# Patient Record
Sex: Female | Born: 1935 | Race: Black or African American | Hispanic: No | Smoking: Never smoker
Health system: Southern US, Community
[De-identification: ages and names within clinical notes are randomized; demographics above are authoritative.]

## PROBLEM LIST (undated history)

## (undated) DIAGNOSIS — I2089 Other forms of angina pectoris: Secondary | ICD-10-CM

## (undated) DIAGNOSIS — M810 Age-related osteoporosis without current pathological fracture: Secondary | ICD-10-CM

## (undated) DIAGNOSIS — E559 Vitamin D deficiency, unspecified: Secondary | ICD-10-CM

## (undated) DIAGNOSIS — G309 Alzheimer's disease, unspecified: Secondary | ICD-10-CM

## (undated) DIAGNOSIS — R29898 Other symptoms and signs involving the musculoskeletal system: Secondary | ICD-10-CM

## (undated) DIAGNOSIS — I639 Cerebral infarction, unspecified: Secondary | ICD-10-CM

## (undated) DIAGNOSIS — R32 Unspecified urinary incontinence: Secondary | ICD-10-CM

## (undated) DIAGNOSIS — R739 Hyperglycemia, unspecified: Secondary | ICD-10-CM

## (undated) DIAGNOSIS — E7800 Pure hypercholesterolemia, unspecified: Secondary | ICD-10-CM

## (undated) DIAGNOSIS — R296 Repeated falls: Secondary | ICD-10-CM

## (undated) DIAGNOSIS — T7840XA Allergy, unspecified, initial encounter: Secondary | ICD-10-CM

## (undated) DIAGNOSIS — I208 Other forms of angina pectoris: Secondary | ICD-10-CM

## (undated) DIAGNOSIS — I219 Acute myocardial infarction, unspecified: Secondary | ICD-10-CM

## (undated) DIAGNOSIS — E079 Disorder of thyroid, unspecified: Secondary | ICD-10-CM

## (undated) DIAGNOSIS — D649 Anemia, unspecified: Secondary | ICD-10-CM

## (undated) DIAGNOSIS — Z8719 Personal history of other diseases of the digestive system: Secondary | ICD-10-CM

## (undated) DIAGNOSIS — F0280 Dementia in other diseases classified elsewhere without behavioral disturbance: Secondary | ICD-10-CM

## (undated) DIAGNOSIS — I509 Heart failure, unspecified: Secondary | ICD-10-CM

## (undated) DIAGNOSIS — E785 Hyperlipidemia, unspecified: Secondary | ICD-10-CM

## (undated) DIAGNOSIS — I10 Essential (primary) hypertension: Secondary | ICD-10-CM

## (undated) DIAGNOSIS — M109 Gout, unspecified: Secondary | ICD-10-CM

## (undated) DIAGNOSIS — R200 Anesthesia of skin: Secondary | ICD-10-CM

## (undated) DIAGNOSIS — F0150 Vascular dementia without behavioral disturbance: Secondary | ICD-10-CM

## (undated) DIAGNOSIS — N189 Chronic kidney disease, unspecified: Secondary | ICD-10-CM

## (undated) HISTORY — DX: Chronic kidney disease, unspecified: N18.9

## (undated) HISTORY — DX: Essential (primary) hypertension: I10

## (undated) HISTORY — DX: Dementia in other diseases classified elsewhere, unspecified severity, without behavioral disturbance, psychotic disturbance, mood disturbance, and anxiety: F02.80

## (undated) HISTORY — DX: Personal history of other diseases of the digestive system: Z87.19

## (undated) HISTORY — DX: Vascular dementia, unspecified severity, without behavioral disturbance, psychotic disturbance, mood disturbance, and anxiety: F01.50

## (undated) HISTORY — DX: Alzheimer's disease, unspecified: G30.9

## (undated) HISTORY — PX: EXPLORATORY LAPAROTOMY: SUR591

## (undated) HISTORY — DX: Age-related osteoporosis without current pathological fracture: M81.0

## (undated) HISTORY — DX: Repeated falls: R29.6

## (undated) HISTORY — DX: Hyperlipidemia, unspecified: E78.5

## (undated) HISTORY — DX: Anesthesia of skin: R20.0

## (undated) HISTORY — DX: Vitamin D deficiency, unspecified: E55.9

## (undated) HISTORY — DX: Disorder of thyroid, unspecified: E07.9

## (undated) HISTORY — DX: Heart failure, unspecified: I50.9

## (undated) HISTORY — DX: Gout, unspecified: M10.9

## (undated) HISTORY — DX: Other symptoms and signs involving the musculoskeletal system: R29.898

## (undated) HISTORY — DX: Allergy, unspecified, initial encounter: T78.40XA

## (undated) HISTORY — DX: Anemia, unspecified: D64.9

## (undated) HISTORY — DX: Unspecified urinary incontinence: R32

## (undated) HISTORY — PX: EYE SURGERY: SHX253

## (undated) HISTORY — DX: Hyperglycemia, unspecified: R73.9

---

## 2003-12-07 ENCOUNTER — Other Ambulatory Visit: Payer: Self-pay

## 2004-08-01 ENCOUNTER — Ambulatory Visit: Payer: Self-pay | Admitting: Nephrology

## 2004-09-22 ENCOUNTER — Inpatient Hospital Stay: Payer: Self-pay

## 2006-06-22 ENCOUNTER — Ambulatory Visit: Payer: Self-pay | Admitting: Family Medicine

## 2006-11-23 ENCOUNTER — Emergency Department: Payer: Self-pay | Admitting: Emergency Medicine

## 2006-12-26 ENCOUNTER — Emergency Department: Payer: Self-pay | Admitting: Emergency Medicine

## 2007-04-05 DIAGNOSIS — Z8719 Personal history of other diseases of the digestive system: Secondary | ICD-10-CM | POA: Insufficient documentation

## 2007-10-24 ENCOUNTER — Emergency Department: Payer: Self-pay | Admitting: Emergency Medicine

## 2007-11-01 ENCOUNTER — Ambulatory Visit: Payer: Self-pay | Admitting: Family Medicine

## 2008-01-22 ENCOUNTER — Other Ambulatory Visit: Payer: Self-pay

## 2008-01-23 ENCOUNTER — Inpatient Hospital Stay: Payer: Self-pay | Admitting: Internal Medicine

## 2008-08-10 ENCOUNTER — Ambulatory Visit: Payer: Self-pay | Admitting: Family Medicine

## 2009-05-17 ENCOUNTER — Ambulatory Visit: Payer: Self-pay | Admitting: Family Medicine

## 2010-05-01 LAB — HM DEXA SCAN

## 2010-06-03 ENCOUNTER — Ambulatory Visit: Payer: Self-pay | Admitting: Family Medicine

## 2010-07-03 ENCOUNTER — Ambulatory Visit: Payer: Self-pay | Admitting: Family Medicine

## 2010-07-03 LAB — HM MAMMOGRAPHY: HM MAMMO: NORMAL

## 2010-09-12 ENCOUNTER — Emergency Department: Payer: Self-pay | Admitting: Emergency Medicine

## 2011-02-25 ENCOUNTER — Inpatient Hospital Stay: Payer: Self-pay | Admitting: Internal Medicine

## 2011-10-08 DIAGNOSIS — M109 Gout, unspecified: Secondary | ICD-10-CM | POA: Diagnosis not present

## 2011-10-08 DIAGNOSIS — E785 Hyperlipidemia, unspecified: Secondary | ICD-10-CM | POA: Diagnosis not present

## 2011-10-08 DIAGNOSIS — N184 Chronic kidney disease, stage 4 (severe): Secondary | ICD-10-CM | POA: Diagnosis not present

## 2011-10-08 DIAGNOSIS — I10 Essential (primary) hypertension: Secondary | ICD-10-CM | POA: Diagnosis not present

## 2011-11-17 DIAGNOSIS — E785 Hyperlipidemia, unspecified: Secondary | ICD-10-CM | POA: Diagnosis not present

## 2011-11-17 DIAGNOSIS — E039 Hypothyroidism, unspecified: Secondary | ICD-10-CM | POA: Diagnosis not present

## 2011-11-17 DIAGNOSIS — N184 Chronic kidney disease, stage 4 (severe): Secondary | ICD-10-CM | POA: Diagnosis not present

## 2011-11-17 DIAGNOSIS — I10 Essential (primary) hypertension: Secondary | ICD-10-CM | POA: Diagnosis not present

## 2012-04-05 DIAGNOSIS — N184 Chronic kidney disease, stage 4 (severe): Secondary | ICD-10-CM | POA: Diagnosis not present

## 2012-04-05 DIAGNOSIS — I10 Essential (primary) hypertension: Secondary | ICD-10-CM | POA: Diagnosis not present

## 2012-04-15 DIAGNOSIS — N250 Renal osteodystrophy: Secondary | ICD-10-CM | POA: Diagnosis not present

## 2012-04-15 DIAGNOSIS — N184 Chronic kidney disease, stage 4 (severe): Secondary | ICD-10-CM | POA: Diagnosis not present

## 2012-04-15 DIAGNOSIS — D631 Anemia in chronic kidney disease: Secondary | ICD-10-CM | POA: Diagnosis not present

## 2012-04-15 DIAGNOSIS — N039 Chronic nephritic syndrome with unspecified morphologic changes: Secondary | ICD-10-CM | POA: Diagnosis not present

## 2012-04-15 DIAGNOSIS — I10 Essential (primary) hypertension: Secondary | ICD-10-CM | POA: Diagnosis not present

## 2012-05-17 DIAGNOSIS — E039 Hypothyroidism, unspecified: Secondary | ICD-10-CM | POA: Diagnosis not present

## 2012-05-17 DIAGNOSIS — N184 Chronic kidney disease, stage 4 (severe): Secondary | ICD-10-CM | POA: Diagnosis not present

## 2012-05-17 DIAGNOSIS — F17200 Nicotine dependence, unspecified, uncomplicated: Secondary | ICD-10-CM | POA: Diagnosis not present

## 2012-05-17 DIAGNOSIS — F0280 Dementia in other diseases classified elsewhere without behavioral disturbance: Secondary | ICD-10-CM | POA: Diagnosis not present

## 2012-05-17 DIAGNOSIS — E785 Hyperlipidemia, unspecified: Secondary | ICD-10-CM | POA: Diagnosis not present

## 2012-05-17 DIAGNOSIS — Z23 Encounter for immunization: Secondary | ICD-10-CM | POA: Diagnosis not present

## 2012-05-17 DIAGNOSIS — G309 Alzheimer's disease, unspecified: Secondary | ICD-10-CM | POA: Diagnosis not present

## 2012-05-17 DIAGNOSIS — I10 Essential (primary) hypertension: Secondary | ICD-10-CM | POA: Diagnosis not present

## 2012-06-11 DIAGNOSIS — I10 Essential (primary) hypertension: Secondary | ICD-10-CM | POA: Diagnosis not present

## 2012-06-11 DIAGNOSIS — I2510 Atherosclerotic heart disease of native coronary artery without angina pectoris: Secondary | ICD-10-CM | POA: Diagnosis not present

## 2012-07-08 ENCOUNTER — Emergency Department: Payer: Self-pay | Admitting: Unknown Physician Specialty

## 2012-07-08 DIAGNOSIS — Z79899 Other long term (current) drug therapy: Secondary | ICD-10-CM | POA: Diagnosis not present

## 2012-07-08 DIAGNOSIS — I10 Essential (primary) hypertension: Secondary | ICD-10-CM | POA: Diagnosis not present

## 2012-07-08 DIAGNOSIS — I252 Old myocardial infarction: Secondary | ICD-10-CM | POA: Diagnosis not present

## 2012-07-08 DIAGNOSIS — K449 Diaphragmatic hernia without obstruction or gangrene: Secondary | ICD-10-CM | POA: Diagnosis not present

## 2012-07-08 DIAGNOSIS — Z8249 Family history of ischemic heart disease and other diseases of the circulatory system: Secondary | ICD-10-CM | POA: Diagnosis not present

## 2012-07-08 DIAGNOSIS — R079 Chest pain, unspecified: Secondary | ICD-10-CM | POA: Diagnosis not present

## 2012-07-08 DIAGNOSIS — Z95818 Presence of other cardiac implants and grafts: Secondary | ICD-10-CM | POA: Diagnosis not present

## 2012-07-08 DIAGNOSIS — R0789 Other chest pain: Secondary | ICD-10-CM | POA: Diagnosis not present

## 2012-07-08 LAB — CK TOTAL AND CKMB (NOT AT ARMC)
CK, Total: 74 U/L (ref 21–215)
CK-MB: 1.5 ng/mL (ref 0.5–3.6)

## 2012-07-08 LAB — COMPREHENSIVE METABOLIC PANEL
Albumin: 3.8 g/dL (ref 3.4–5.0)
Alkaline Phosphatase: 112 U/L (ref 50–136)
Bilirubin,Total: 0.3 mg/dL (ref 0.2–1.0)
Chloride: 108 mmol/L — ABNORMAL HIGH (ref 98–107)
Co2: 21 mmol/L (ref 21–32)
Creatinine: 3.21 mg/dL — ABNORMAL HIGH (ref 0.60–1.30)
EGFR (African American): 15 — ABNORMAL LOW
EGFR (Non-African Amer.): 13 — ABNORMAL LOW
Glucose: 145 mg/dL — ABNORMAL HIGH (ref 65–99)
Osmolality: 288 (ref 275–301)
SGPT (ALT): 13 U/L (ref 12–78)
Sodium: 139 mmol/L (ref 136–145)
Total Protein: 8.1 g/dL (ref 6.4–8.2)

## 2012-07-08 LAB — CBC
HCT: 36.4 % (ref 35.0–47.0)
MCHC: 33 g/dL (ref 32.0–36.0)
Platelet: 279 10*3/uL (ref 150–440)
RBC: 4.3 10*6/uL (ref 3.80–5.20)
RDW: 16.8 % — ABNORMAL HIGH (ref 11.5–14.5)
WBC: 10.6 10*3/uL (ref 3.6–11.0)

## 2012-07-08 LAB — TROPONIN I: Troponin-I: <0.02 ng/mL

## 2012-07-09 DIAGNOSIS — I10 Essential (primary) hypertension: Secondary | ICD-10-CM | POA: Diagnosis not present

## 2012-07-09 DIAGNOSIS — I2510 Atherosclerotic heart disease of native coronary artery without angina pectoris: Secondary | ICD-10-CM | POA: Diagnosis not present

## 2012-07-09 DIAGNOSIS — E782 Mixed hyperlipidemia: Secondary | ICD-10-CM | POA: Diagnosis not present

## 2012-07-09 DIAGNOSIS — R0789 Other chest pain: Secondary | ICD-10-CM | POA: Diagnosis not present

## 2012-08-30 DIAGNOSIS — I10 Essential (primary) hypertension: Secondary | ICD-10-CM | POA: Diagnosis not present

## 2012-08-30 DIAGNOSIS — M109 Gout, unspecified: Secondary | ICD-10-CM | POA: Diagnosis not present

## 2012-08-30 DIAGNOSIS — N184 Chronic kidney disease, stage 4 (severe): Secondary | ICD-10-CM | POA: Diagnosis not present

## 2012-09-02 DIAGNOSIS — N179 Acute kidney failure, unspecified: Secondary | ICD-10-CM | POA: Diagnosis not present

## 2012-09-02 DIAGNOSIS — I10 Essential (primary) hypertension: Secondary | ICD-10-CM | POA: Diagnosis not present

## 2012-09-02 DIAGNOSIS — N184 Chronic kidney disease, stage 4 (severe): Secondary | ICD-10-CM | POA: Diagnosis not present

## 2012-09-02 DIAGNOSIS — D649 Anemia, unspecified: Secondary | ICD-10-CM | POA: Diagnosis not present

## 2012-09-02 DIAGNOSIS — E872 Acidosis: Secondary | ICD-10-CM | POA: Diagnosis not present

## 2012-09-06 DIAGNOSIS — I2510 Atherosclerotic heart disease of native coronary artery without angina pectoris: Secondary | ICD-10-CM | POA: Diagnosis not present

## 2012-09-06 DIAGNOSIS — E782 Mixed hyperlipidemia: Secondary | ICD-10-CM | POA: Diagnosis not present

## 2012-09-06 DIAGNOSIS — I10 Essential (primary) hypertension: Secondary | ICD-10-CM | POA: Diagnosis not present

## 2012-09-20 DIAGNOSIS — I10 Essential (primary) hypertension: Secondary | ICD-10-CM | POA: Diagnosis not present

## 2012-09-20 DIAGNOSIS — E872 Acidosis: Secondary | ICD-10-CM | POA: Diagnosis not present

## 2012-09-20 DIAGNOSIS — N179 Acute kidney failure, unspecified: Secondary | ICD-10-CM | POA: Diagnosis not present

## 2012-09-20 DIAGNOSIS — I2510 Atherosclerotic heart disease of native coronary artery without angina pectoris: Secondary | ICD-10-CM | POA: Diagnosis not present

## 2012-09-20 DIAGNOSIS — E039 Hypothyroidism, unspecified: Secondary | ICD-10-CM | POA: Diagnosis not present

## 2012-09-20 DIAGNOSIS — E785 Hyperlipidemia, unspecified: Secondary | ICD-10-CM | POA: Diagnosis not present

## 2012-09-20 DIAGNOSIS — N185 Chronic kidney disease, stage 5: Secondary | ICD-10-CM | POA: Diagnosis not present

## 2012-09-20 DIAGNOSIS — N184 Chronic kidney disease, stage 4 (severe): Secondary | ICD-10-CM | POA: Diagnosis not present

## 2012-11-11 DIAGNOSIS — I10 Essential (primary) hypertension: Secondary | ICD-10-CM | POA: Diagnosis not present

## 2012-11-11 DIAGNOSIS — E872 Acidosis: Secondary | ICD-10-CM | POA: Diagnosis not present

## 2012-11-11 DIAGNOSIS — N184 Chronic kidney disease, stage 4 (severe): Secondary | ICD-10-CM | POA: Diagnosis not present

## 2013-01-20 DIAGNOSIS — E785 Hyperlipidemia, unspecified: Secondary | ICD-10-CM | POA: Diagnosis not present

## 2013-01-20 DIAGNOSIS — I10 Essential (primary) hypertension: Secondary | ICD-10-CM | POA: Diagnosis not present

## 2013-01-20 DIAGNOSIS — N390 Urinary tract infection, site not specified: Secondary | ICD-10-CM | POA: Diagnosis not present

## 2013-01-20 DIAGNOSIS — E039 Hypothyroidism, unspecified: Secondary | ICD-10-CM | POA: Diagnosis not present

## 2013-01-20 DIAGNOSIS — N184 Chronic kidney disease, stage 4 (severe): Secondary | ICD-10-CM | POA: Diagnosis not present

## 2013-01-20 DIAGNOSIS — I2510 Atherosclerotic heart disease of native coronary artery without angina pectoris: Secondary | ICD-10-CM | POA: Diagnosis not present

## 2013-05-03 DIAGNOSIS — N185 Chronic kidney disease, stage 5: Secondary | ICD-10-CM | POA: Diagnosis not present

## 2013-05-26 DIAGNOSIS — R94120 Abnormal auditory function study: Secondary | ICD-10-CM | POA: Diagnosis not present

## 2013-05-26 DIAGNOSIS — N184 Chronic kidney disease, stage 4 (severe): Secondary | ICD-10-CM | POA: Diagnosis not present

## 2013-05-26 DIAGNOSIS — E039 Hypothyroidism, unspecified: Secondary | ICD-10-CM | POA: Diagnosis not present

## 2013-05-26 DIAGNOSIS — Z23 Encounter for immunization: Secondary | ICD-10-CM | POA: Diagnosis not present

## 2013-05-26 DIAGNOSIS — Z1331 Encounter for screening for depression: Secondary | ICD-10-CM | POA: Diagnosis not present

## 2013-05-26 DIAGNOSIS — I10 Essential (primary) hypertension: Secondary | ICD-10-CM | POA: Diagnosis not present

## 2013-05-26 DIAGNOSIS — M159 Polyosteoarthritis, unspecified: Secondary | ICD-10-CM | POA: Diagnosis not present

## 2013-10-03 DIAGNOSIS — E785 Hyperlipidemia, unspecified: Secondary | ICD-10-CM | POA: Diagnosis not present

## 2013-10-03 DIAGNOSIS — N184 Chronic kidney disease, stage 4 (severe): Secondary | ICD-10-CM | POA: Diagnosis not present

## 2013-10-03 DIAGNOSIS — I10 Essential (primary) hypertension: Secondary | ICD-10-CM | POA: Diagnosis not present

## 2013-10-03 DIAGNOSIS — I2510 Atherosclerotic heart disease of native coronary artery without angina pectoris: Secondary | ICD-10-CM | POA: Diagnosis not present

## 2013-10-03 DIAGNOSIS — E039 Hypothyroidism, unspecified: Secondary | ICD-10-CM | POA: Diagnosis not present

## 2013-10-05 DIAGNOSIS — R7309 Other abnormal glucose: Secondary | ICD-10-CM | POA: Diagnosis not present

## 2013-10-05 DIAGNOSIS — M109 Gout, unspecified: Secondary | ICD-10-CM | POA: Diagnosis not present

## 2013-10-05 DIAGNOSIS — D649 Anemia, unspecified: Secondary | ICD-10-CM | POA: Diagnosis not present

## 2013-11-24 DIAGNOSIS — E872 Acidosis, unspecified: Secondary | ICD-10-CM | POA: Diagnosis not present

## 2013-11-24 DIAGNOSIS — N185 Chronic kidney disease, stage 5: Secondary | ICD-10-CM | POA: Diagnosis not present

## 2013-11-24 DIAGNOSIS — I120 Hypertensive chronic kidney disease with stage 5 chronic kidney disease or end stage renal disease: Secondary | ICD-10-CM | POA: Diagnosis not present

## 2014-02-07 DIAGNOSIS — D509 Iron deficiency anemia, unspecified: Secondary | ICD-10-CM | POA: Diagnosis not present

## 2014-02-07 DIAGNOSIS — Z8711 Personal history of peptic ulcer disease: Secondary | ICD-10-CM | POA: Diagnosis not present

## 2014-02-07 DIAGNOSIS — E039 Hypothyroidism, unspecified: Secondary | ICD-10-CM | POA: Diagnosis not present

## 2014-02-07 DIAGNOSIS — N184 Chronic kidney disease, stage 4 (severe): Secondary | ICD-10-CM | POA: Diagnosis not present

## 2014-03-29 DIAGNOSIS — I214 Non-ST elevation (NSTEMI) myocardial infarction: Secondary | ICD-10-CM | POA: Insufficient documentation

## 2014-03-29 DIAGNOSIS — E785 Hyperlipidemia, unspecified: Secondary | ICD-10-CM | POA: Insufficient documentation

## 2014-03-29 DIAGNOSIS — I5032 Chronic diastolic (congestive) heart failure: Secondary | ICD-10-CM | POA: Insufficient documentation

## 2014-03-30 DIAGNOSIS — I214 Non-ST elevation (NSTEMI) myocardial infarction: Secondary | ICD-10-CM | POA: Diagnosis not present

## 2014-03-30 DIAGNOSIS — I2510 Atherosclerotic heart disease of native coronary artery without angina pectoris: Secondary | ICD-10-CM | POA: Diagnosis not present

## 2014-03-30 DIAGNOSIS — I5032 Chronic diastolic (congestive) heart failure: Secondary | ICD-10-CM | POA: Diagnosis not present

## 2014-03-30 DIAGNOSIS — I10 Essential (primary) hypertension: Secondary | ICD-10-CM | POA: Diagnosis not present

## 2014-06-12 DIAGNOSIS — N184 Chronic kidney disease, stage 4 (severe): Secondary | ICD-10-CM | POA: Diagnosis not present

## 2014-06-12 DIAGNOSIS — M109 Gout, unspecified: Secondary | ICD-10-CM | POA: Diagnosis not present

## 2014-06-12 DIAGNOSIS — R739 Hyperglycemia, unspecified: Secondary | ICD-10-CM | POA: Diagnosis not present

## 2014-06-12 DIAGNOSIS — E785 Hyperlipidemia, unspecified: Secondary | ICD-10-CM | POA: Diagnosis not present

## 2014-06-12 DIAGNOSIS — I10 Essential (primary) hypertension: Secondary | ICD-10-CM | POA: Diagnosis not present

## 2014-06-12 DIAGNOSIS — Z23 Encounter for immunization: Secondary | ICD-10-CM | POA: Diagnosis not present

## 2014-06-12 DIAGNOSIS — E039 Hypothyroidism, unspecified: Secondary | ICD-10-CM | POA: Diagnosis not present

## 2014-06-12 DIAGNOSIS — D509 Iron deficiency anemia, unspecified: Secondary | ICD-10-CM | POA: Diagnosis not present

## 2014-09-01 DIAGNOSIS — N185 Chronic kidney disease, stage 5: Secondary | ICD-10-CM | POA: Diagnosis not present

## 2014-09-04 DIAGNOSIS — I120 Hypertensive chronic kidney disease with stage 5 chronic kidney disease or end stage renal disease: Secondary | ICD-10-CM | POA: Diagnosis not present

## 2014-09-04 DIAGNOSIS — N185 Chronic kidney disease, stage 5: Secondary | ICD-10-CM | POA: Diagnosis not present

## 2014-09-04 DIAGNOSIS — D631 Anemia in chronic kidney disease: Secondary | ICD-10-CM | POA: Diagnosis not present

## 2014-10-13 DIAGNOSIS — E039 Hypothyroidism, unspecified: Secondary | ICD-10-CM | POA: Diagnosis not present

## 2014-10-13 DIAGNOSIS — M109 Gout, unspecified: Secondary | ICD-10-CM | POA: Diagnosis not present

## 2014-10-13 DIAGNOSIS — M810 Age-related osteoporosis without current pathological fracture: Secondary | ICD-10-CM | POA: Diagnosis not present

## 2014-10-13 DIAGNOSIS — G3184 Mild cognitive impairment, so stated: Secondary | ICD-10-CM | POA: Diagnosis not present

## 2014-10-13 DIAGNOSIS — I10 Essential (primary) hypertension: Secondary | ICD-10-CM | POA: Diagnosis not present

## 2014-10-13 DIAGNOSIS — N184 Chronic kidney disease, stage 4 (severe): Secondary | ICD-10-CM | POA: Diagnosis not present

## 2014-10-13 DIAGNOSIS — E785 Hyperlipidemia, unspecified: Secondary | ICD-10-CM | POA: Diagnosis not present

## 2014-10-13 DIAGNOSIS — M159 Polyosteoarthritis, unspecified: Secondary | ICD-10-CM | POA: Diagnosis not present

## 2014-10-13 LAB — LIPID PANEL
Cholesterol: 157 mg/dL (ref 0–200)
HDL: 54 mg/dL (ref 35–70)
LDL Cholesterol: 74 mg/dL
TRIGLYCERIDES: 143 mg/dL (ref 40–160)

## 2014-10-17 DIAGNOSIS — N184 Chronic kidney disease, stage 4 (severe): Secondary | ICD-10-CM | POA: Diagnosis not present

## 2015-02-18 ENCOUNTER — Other Ambulatory Visit: Payer: Self-pay | Admitting: Family Medicine

## 2015-03-13 DIAGNOSIS — N185 Chronic kidney disease, stage 5: Secondary | ICD-10-CM | POA: Diagnosis not present

## 2015-03-13 DIAGNOSIS — I1311 Hypertensive heart and chronic kidney disease without heart failure, with stage 5 chronic kidney disease, or end stage renal disease: Secondary | ICD-10-CM | POA: Diagnosis not present

## 2015-03-19 DIAGNOSIS — N185 Chronic kidney disease, stage 5: Secondary | ICD-10-CM | POA: Diagnosis not present

## 2015-04-04 ENCOUNTER — Other Ambulatory Visit: Payer: Self-pay | Admitting: Family Medicine

## 2015-04-08 ENCOUNTER — Encounter: Payer: Self-pay | Admitting: Family Medicine

## 2015-04-08 DIAGNOSIS — Z720 Tobacco use: Secondary | ICD-10-CM | POA: Insufficient documentation

## 2015-04-08 DIAGNOSIS — N8184 Pelvic muscle wasting: Secondary | ICD-10-CM | POA: Insufficient documentation

## 2015-04-08 DIAGNOSIS — M109 Gout, unspecified: Secondary | ICD-10-CM | POA: Insufficient documentation

## 2015-04-08 DIAGNOSIS — I10 Essential (primary) hypertension: Secondary | ICD-10-CM | POA: Insufficient documentation

## 2015-04-08 DIAGNOSIS — M810 Age-related osteoporosis without current pathological fracture: Secondary | ICD-10-CM | POA: Insufficient documentation

## 2015-04-08 DIAGNOSIS — M159 Polyosteoarthritis, unspecified: Secondary | ICD-10-CM | POA: Insufficient documentation

## 2015-04-08 DIAGNOSIS — J309 Allergic rhinitis, unspecified: Secondary | ICD-10-CM | POA: Insufficient documentation

## 2015-04-08 DIAGNOSIS — F09 Unspecified mental disorder due to known physiological condition: Secondary | ICD-10-CM | POA: Insufficient documentation

## 2015-04-08 DIAGNOSIS — I2510 Atherosclerotic heart disease of native coronary artery without angina pectoris: Secondary | ICD-10-CM | POA: Insufficient documentation

## 2015-04-08 DIAGNOSIS — Z8711 Personal history of peptic ulcer disease: Secondary | ICD-10-CM | POA: Insufficient documentation

## 2015-04-08 DIAGNOSIS — D638 Anemia in other chronic diseases classified elsewhere: Secondary | ICD-10-CM | POA: Insufficient documentation

## 2015-04-08 DIAGNOSIS — E039 Hypothyroidism, unspecified: Secondary | ICD-10-CM | POA: Insufficient documentation

## 2015-04-13 ENCOUNTER — Ambulatory Visit (INDEPENDENT_AMBULATORY_CARE_PROVIDER_SITE_OTHER): Payer: Medicare Other | Admitting: Family Medicine

## 2015-04-13 ENCOUNTER — Encounter (INDEPENDENT_AMBULATORY_CARE_PROVIDER_SITE_OTHER): Payer: Self-pay

## 2015-04-13 ENCOUNTER — Encounter: Payer: Self-pay | Admitting: Family Medicine

## 2015-04-13 VITALS — BP 138/68 | HR 66 | Temp 98.0°F | Resp 16 | Ht 62.0 in | Wt 122.6 lb

## 2015-04-13 DIAGNOSIS — F068 Other specified mental disorders due to known physiological condition: Secondary | ICD-10-CM

## 2015-04-13 DIAGNOSIS — N2581 Secondary hyperparathyroidism of renal origin: Secondary | ICD-10-CM | POA: Insufficient documentation

## 2015-04-13 DIAGNOSIS — E785 Hyperlipidemia, unspecified: Secondary | ICD-10-CM

## 2015-04-13 DIAGNOSIS — Z23 Encounter for immunization: Secondary | ICD-10-CM | POA: Diagnosis not present

## 2015-04-13 DIAGNOSIS — N185 Chronic kidney disease, stage 5: Secondary | ICD-10-CM | POA: Diagnosis not present

## 2015-04-13 DIAGNOSIS — I2510 Atherosclerotic heart disease of native coronary artery without angina pectoris: Secondary | ICD-10-CM | POA: Diagnosis not present

## 2015-04-13 DIAGNOSIS — I10 Essential (primary) hypertension: Secondary | ICD-10-CM

## 2015-04-13 DIAGNOSIS — D638 Anemia in other chronic diseases classified elsewhere: Secondary | ICD-10-CM

## 2015-04-13 DIAGNOSIS — F09 Unspecified mental disorder due to known physiological condition: Secondary | ICD-10-CM

## 2015-04-13 MED ORDER — METOPROLOL TARTRATE 50 MG PO TABS
50.0000 mg | ORAL_TABLET | Freq: Two times a day (BID) | ORAL | Status: DC
Start: 2015-04-13 — End: 2015-05-25

## 2015-04-13 MED ORDER — AMLODIPINE BESYLATE 5 MG PO TABS
5.0000 mg | ORAL_TABLET | Freq: Every day | ORAL | Status: DC
Start: 2015-04-13 — End: 2016-04-22

## 2015-04-13 MED ORDER — ROSUVASTATIN CALCIUM 10 MG PO TABS: 10.0000 mg | ORAL_TABLET | Freq: Every day | ORAL | Status: DC

## 2015-04-13 NOTE — Progress Notes (Signed)
Name: Crystal Faulkner   MRN: [id number]    DOB: [birth year]   Date:04/13/2015       Progress Note  Subjective  Chief Complaint  Chief Complaint  Patient presents with  . Medication Refill    6 month F/U  . Gastrophageal Reflux    Well Controlled  . Hypertension    Well Controlled, Check BP at home   . Hyperlipidemia    No problems  . Knee Pain    Onset-years, Right Knee pain, swollen    HPI  HTN: taking medication, denies side effects, bp is at goal   GERD: taking Dexilant daily, and symptoms have been controlled, denies heartburn or regurgitation.  Hyperlipidemia: taking Crestor and denies side effects  OA: continues to have pain on right knee,not a candidate for surgery, using a cane to assist with her gait, she is taking tylenol prn for pain.   Hypothyroidism: taking Levothyroxine, same dose for years, no weight change, no dry skin, no dysphagia, nor hair loss.  Anemia of Chronic disease: last iron storage within normal limits, advised to stop iron supplementation since she has anemia or Chronic disease   CAD: denies chest pain or palpitation, she has SOB with activity, sees cardiologist, still takes aspirin, statin and beta-blocker  CKI stage V: recently seen by nephrologist, stable levels  Mild cognitive impairment: daughter gives her medication, no behavior problems, able to perform other ADL  Patient Active Problem List   Diagnosis Date Noted  . Arteriosclerosis of coronary artery 04/08/2015  . Benign hypertension 04/08/2015  . Chronic kidney disease (CKD), stage V 04/08/2015  . Gout 04/08/2015  . Dyslipidemia 04/08/2015  . History of peptic ulcer disease 04/08/2015  . Adult hypothyroidism 04/08/2015  . Mild cognitive disorder 04/08/2015  . Anemia of chronic disease 04/08/2015  . Osteoarthrosis involving more than one site but not generalized 04/08/2015  . Osteoporosis 04/08/2015  . Pelvic muscle wasting 04/08/2015  . Allergic rhinitis 04/08/2015  . Tobacco  use 04/08/2015  . Acute non-ST segment elevation myocardial infarction 03/29/2014  . CCF (congestive cardiac failure) 03/29/2014  . HLD (hyperlipidemia) 03/29/2014  . H/O gastrointestinal hemorrhage 04/05/2007    Past Surgical History  Procedure Laterality Date  . Exploratory laparotomy    . Eye surgery Bilateral     cataract    Family History  Problem Relation Age of Onset  . Diabetes Sister   . Hypertension Sister     Social History   Social History  . Marital Status: Widowed    Spouse Name: N/A  . Number of Children: N/A  . Years of Education: N/A   Occupational History  . Not on file.   Social History Main Topics  . Smoking status: Never Smoker   . Smokeless tobacco: Current User    Types: Snuff  . Alcohol Use: No  . Drug Use: No  . Sexual Activity: No   Other Topics Concern  . Not on file   Social History Narrative     Current outpatient prescriptions:  .  allopurinol (ZYLOPRIM) 100 MG tablet, Take 1 tablet by mouth daily., Disp: , Rfl:  .  amLODipine (NORVASC) 5 MG tablet, Take 1 tablet by mouth daily., Disp: , Rfl:  .  aspirin 81 MG tablet, Take 1 tablet by mouth daily., Disp: , Rfl:  .  Cholecalciferol (VITAMIN D3) 2000 UNITS capsule, Take 1 capsule by mouth daily., Disp: , Rfl:  .  CRESTOR 10 MG tablet, TAKE 1 TABLET BY MOUTH EVERY DAY,  Disp: 90 tablet, Rfl: 0 .  DEXILANT 60 MG capsule, TAKE ONE CAPSULE BY MOUTH EVERY DAY, Disp: 90 capsule, Rfl: 1 .  furosemide (LASIX) 40 MG tablet, Take 1 tablet by mouth daily., Disp: , Rfl:  .  isosorbide mononitrate (IMDUR) 60 MG 24 hr tablet, Take 1 tablet by mouth daily., Disp: , Rfl:  .  levothyroxine (SYNTHROID) 75 MCG tablet, Take 1 tablet by mouth daily at 6 (six) AM., Disp: , Rfl:  .  metoprolol (LOPRESSOR) 50 MG tablet, Take by mouth., Disp: , Rfl:  .  raloxifene (EVISTA) 60 MG tablet, Take by mouth., Disp: , Rfl:  .  sodium bicarbonate 650 MG tablet, TAKE 2 TABLETS (1,300 MG TOTAL) BY MOUTH TWO (2) TIMES  A DAY., Disp: , Rfl: 11  No Known Allergies   ROS  Constitutional: Negative for fever or weight change.  Respiratory: Negative for cough and shortness of breath.   Cardiovascular: Negative for chest pain or palpitations.  Gastrointestinal: Negative for abdominal pain, no bowel changes.  Musculoskeletal: Positive  for gait problem or joint swelling.  Skin: Negative for rash.  Neurological: Negative for dizziness or headache.  No other specific complaints in a complete review of systems (except as listed in HPI above).  Objective  Filed Vitals:   04/13/15 1151  BP: 138/68  Pulse: 66  Temp: 98 F (36.7 C)  TempSrc: Oral  Resp: 16  Height: 5\' 2"  (1.575 m)  Weight: 122 lb 9.6 oz (55.611 kg)  SpO2: 94%    Body mass index is 22.42 kg/(m^2).  Physical Exam  Constitutional: Patient appears well-developed and well-nourished. Obese  No distress.  HEENT: head atraumatic, normocephalic, pupils equal and reactive to light,  neck supple, no thyromegaly,  throat within normal limits Cardiovascular: Normal rate, regular rhythm and normal heart sounds.  No murmur heard. No BLE edema. Pulmonary/Chest: Effort normal and breath sounds normal. No respiratory distress. Abdominal: Soft.  There is no tenderness. Psychiatric: Patient has a normal mood and affect. behavior is normal Muscular Skeletal: decrease extension of right knee, crepitus with extension, tender to palpation, no erythema or effusion noticed. Uses cane to assist with ambulation    PHQ2/9: Depression screen PHQ 2/9 04/13/2015  Decreased Interest 0  Down, Depressed, Hopeless 0  PHQ - 2 Score 0    Fall Risk: Fall Risk  04/13/2015  Falls in the past year? No     Assessment & Plan  1. Arteriosclerosis of coronary artery Continue follow up cardiologist  2. Need for pneumococcal vaccination  - Pneumococcal conjugate vaccine 13-valent IM - out of stock will get it with flu vaccine next month  3. Need for shingles  vaccine  - Varicella-zoster vaccine subcutaneous  4. Chronic kidney disease (CKD), stage V stable  5. Dyslipidemia Labs up to date  6. Mild cognitive disorder stable  7. Anemia of chronic disease stable  8. Benign hypertension At goal

## 2015-05-11 ENCOUNTER — Ambulatory Visit (INDEPENDENT_AMBULATORY_CARE_PROVIDER_SITE_OTHER): Payer: Medicare Other

## 2015-05-11 DIAGNOSIS — I25119 Atherosclerotic heart disease of native coronary artery with unspecified angina pectoris: Secondary | ICD-10-CM | POA: Diagnosis not present

## 2015-05-11 DIAGNOSIS — M1990 Unspecified osteoarthritis, unspecified site: Secondary | ICD-10-CM | POA: Diagnosis not present

## 2015-05-11 DIAGNOSIS — N185 Chronic kidney disease, stage 5: Secondary | ICD-10-CM | POA: Diagnosis not present

## 2015-05-11 DIAGNOSIS — I209 Angina pectoris, unspecified: Secondary | ICD-10-CM | POA: Diagnosis not present

## 2015-05-11 DIAGNOSIS — I739 Peripheral vascular disease, unspecified: Secondary | ICD-10-CM | POA: Diagnosis not present

## 2015-05-11 DIAGNOSIS — E079 Disorder of thyroid, unspecified: Secondary | ICD-10-CM | POA: Diagnosis not present

## 2015-05-11 DIAGNOSIS — Z23 Encounter for immunization: Secondary | ICD-10-CM | POA: Diagnosis not present

## 2015-05-11 DIAGNOSIS — I509 Heart failure, unspecified: Secondary | ICD-10-CM | POA: Diagnosis not present

## 2015-05-11 DIAGNOSIS — I10 Essential (primary) hypertension: Secondary | ICD-10-CM | POA: Diagnosis not present

## 2015-05-11 DIAGNOSIS — I252 Old myocardial infarction: Secondary | ICD-10-CM | POA: Diagnosis not present

## 2015-05-25 ENCOUNTER — Other Ambulatory Visit: Payer: Self-pay | Admitting: Family Medicine

## 2015-05-25 NOTE — Telephone Encounter (Signed)
Patient requesting refill. 

## 2015-10-16 ENCOUNTER — Encounter: Payer: Self-pay | Admitting: Family Medicine

## 2015-10-16 ENCOUNTER — Ambulatory Visit (INDEPENDENT_AMBULATORY_CARE_PROVIDER_SITE_OTHER): Payer: Medicare Other | Admitting: Family Medicine

## 2015-10-16 VITALS — BP 134/82 | HR 88 | Temp 98.0°F | Resp 16 | Ht 62.0 in | Wt 126.5 lb

## 2015-10-16 DIAGNOSIS — D638 Anemia in other chronic diseases classified elsewhere: Secondary | ICD-10-CM | POA: Diagnosis not present

## 2015-10-16 DIAGNOSIS — I10 Essential (primary) hypertension: Secondary | ICD-10-CM | POA: Diagnosis not present

## 2015-10-16 DIAGNOSIS — Z8711 Personal history of peptic ulcer disease: Secondary | ICD-10-CM

## 2015-10-16 DIAGNOSIS — I208 Other forms of angina pectoris: Secondary | ICD-10-CM | POA: Diagnosis not present

## 2015-10-16 DIAGNOSIS — E785 Hyperlipidemia, unspecified: Secondary | ICD-10-CM

## 2015-10-16 DIAGNOSIS — E038 Other specified hypothyroidism: Secondary | ICD-10-CM | POA: Diagnosis not present

## 2015-10-16 DIAGNOSIS — N2581 Secondary hyperparathyroidism of renal origin: Secondary | ICD-10-CM

## 2015-10-16 DIAGNOSIS — F068 Other specified mental disorders due to known physiological condition: Secondary | ICD-10-CM

## 2015-10-16 DIAGNOSIS — N185 Chronic kidney disease, stage 5: Secondary | ICD-10-CM | POA: Diagnosis not present

## 2015-10-16 DIAGNOSIS — I2510 Atherosclerotic heart disease of native coronary artery without angina pectoris: Secondary | ICD-10-CM

## 2015-10-16 DIAGNOSIS — Z23 Encounter for immunization: Secondary | ICD-10-CM | POA: Diagnosis not present

## 2015-10-16 DIAGNOSIS — M109 Gout, unspecified: Secondary | ICD-10-CM

## 2015-10-16 DIAGNOSIS — F09 Unspecified mental disorder due to known physiological condition: Secondary | ICD-10-CM

## 2015-10-16 MED ORDER — DEXLANSOPRAZOLE 60 MG PO CPDR: 1.0000 | DELAYED_RELEASE_CAPSULE | Freq: Every day | ORAL | Status: DC

## 2015-10-16 NOTE — Progress Notes (Signed)
Name: Crystal Faulkner   MRN: [id number]    DOB: 07/30/1925   Date:10/16/2015       Progress Note  Subjective  Chief Complaint  Chief Complaint  Patient presents with  . Medication Refill    4 month F/U  . Hypertension    Takes medication every day at 2 p.m.  Marland Kitchen Hyperlipidemia  . Gastroesophageal Reflux    Well controlled with medication  . Hypothyroidism    HPI   HTN: taking medication, denies side effects, bp is at goal , no chest pain or palpitation  GERD/history of GI bleed: taking Dexilant daily, and symptoms have been controlled, denies heartburn or regurgitation.  Hyperlipidemia: taking Crestor and denies side effects, due for labs today   OA: continues to have pain on right knee,not a candidate for surgery, using a cane to assist with her gait, she is taking tylenol prn for pain.   Hypothyroidism: taking Levothyroxine, same dose for years, no weight change, no dry skin, no dysphagia, nor hair loss.  Anemia of Chronic disease: last iron storage within normal limits, advised to stop iron supplementation since she has anemia or Chronic disease   CAD/Chronic angina: denies chest pain or palpitation, she has SOB with activity, sees cardiologist, still takes aspirin, statin and beta-blocker. She is on Imdur with good control of angina symptoms  CKI stage V: sees nephrologist at [facility] we will recheck levels  Mild cognitive impairment: daughter gives her medication, no behavior problems, she has 24 hour care at home from family  Patient Active Problem List   Diagnosis Date Noted  . Chronic stable angina ([place]) 10/16/2015  . Secondary hyperparathyroidism ([place]) 04/13/2015  . Arteriosclerosis of coronary artery 04/08/2015  . Benign hypertension 04/08/2015  . Chronic kidney disease (CKD), stage V ([place]) 04/08/2015  . Controlled gout 04/08/2015  . Dyslipidemia 04/08/2015  . History of peptic ulcer disease 04/08/2015  . Adult hypothyroidism 04/08/2015  . Mild cognitive disorder  04/08/2015  . Anemia of chronic disease 04/08/2015  . Osteoarthrosis involving more than one site but not generalized 04/08/2015  . Osteoporosis 04/08/2015  . Pelvic muscle wasting 04/08/2015  . Allergic rhinitis 04/08/2015  . Tobacco use 04/08/2015  . CCF (congestive cardiac failure) ([place]) 03/29/2014  . HLD (hyperlipidemia) 03/29/2014  . H/O gastrointestinal hemorrhage 04/05/2007    Past Surgical History  Procedure Laterality Date  . Exploratory laparotomy    . Eye surgery Bilateral     cataract    Family History  Problem Relation Age of Onset  . Diabetes Sister   . Hypertension Sister     Social History   Social History  . Marital Status: Widowed    Spouse Name: N/A  . Number of Children: N/A  . Years of Education: N/A   Occupational History  . Not on file.   Social History Main Topics  . Smoking status: Never Smoker   . Smokeless tobacco: Current User    Types: Snuff  . Alcohol Use: No  . Drug Use: No  . Sexual Activity: No   Other Topics Concern  . Not on file   Social History Narrative     Current outpatient prescriptions:  .  allopurinol (ZYLOPRIM) 100 MG tablet, Take 1 tablet by mouth daily., Disp: , Rfl:  .  amLODipine (NORVASC) 5 MG tablet, Take 1 tablet (5 mg total) by mouth daily., Disp: 90 tablet, Rfl: 1 .  aspirin 81 MG tablet, Take 1 tablet by mouth daily., Disp: , Rfl:  .  dexlansoprazole (DEXILANT) 60 MG capsule, Take 1 capsule (60 mg total) by mouth daily., Disp: 90 capsule, Rfl: 3 .  furosemide (LASIX) 40 MG tablet, Take 1 tablet by mouth daily., Disp: , Rfl:  .  isosorbide mononitrate (IMDUR) 60 MG 24 hr tablet, Take 1 tablet by mouth daily., Disp: , Rfl:  .  levothyroxine (SYNTHROID) 75 MCG tablet, Take 1 tablet by mouth daily at 6 (six) AM., Disp: , Rfl:  .  metoprolol (LOPRESSOR) 50 MG tablet, TAKE 1/2 TABLET BY MOUTH TWICE A DAY, Disp: 90 tablet, Rfl: 1 .  raloxifene (EVISTA) 60 MG tablet, Take by mouth., Disp: , Rfl:  .   rosuvastatin (CRESTOR) 10 MG tablet, Take 1 tablet (10 mg total) by mouth daily., Disp: 90 tablet, Rfl: 4 .  sodium bicarbonate 650 MG tablet, TAKE 2 TABLETS (1,300 MG TOTAL) BY MOUTH TWO (2) TIMES A DAY., Disp: , Rfl: 11 .  Cholecalciferol (VITAMIN D3) 2000 UNITS capsule, Take 1 capsule by mouth daily., Disp: , Rfl:  .  ferrous sulfate 324 (65 Fe) MG TBEC, , Disp: , Rfl:  .  Multiple Vitamin (MULTI-VITAMINS) TABS, Take by mouth. Reported on 10/16/2015, Disp: , Rfl:   No Known Allergies   ROS  Constitutional: Negative for fever , mild  weight change.  Respiratory: Negative for cough and shortness of breath.   Cardiovascular: Negative for chest pain or palpitations.  Gastrointestinal: Negative for abdominal pain, no bowel changes.  Musculoskeletal: Positive for gait problem , no joint swelling.  Skin: Negative for rash.  Neurological: Negative for dizziness or headache.  No other specific complaints in a complete review of systems (except as listed in HPI above).  Objective  Filed Vitals:   10/16/15 1216  BP: 134/82  Pulse: 88  Temp: 98 F (36.7 C)  TempSrc: Oral  Resp: 16  Height: 5\' 2"  (1.575 m)  Weight: 126 lb 8 oz (57.38 kg)  SpO2: 97%    Body mass index is 23.13 kg/(m^2).  Physical Exam  Constitutional: Patient appears well-developed and well-nourished.No distress.  HEENT: head atraumatic, normocephalic, pupils equal and reactive to light, neck supple, throat within normal limits Cardiovascular: Normal rate, regular rhythm and normal heart sounds.  No murmur heard. No BLE edema. Pulmonary/Chest: Effort normal and breath sounds normal. No respiratory distress. Abdominal: Soft.  There is no tenderness. Psychiatric: Patient has a normal mood and affect. behavior is normal.  Muscular Skeletal: grinding with extension of right knee, uses a cane to assist with ambulation    PHQ2/9: Depression screen [facility] 2/9 10/16/2015 04/13/2015  Decreased Interest 0 0  Down, Depressed,  Hopeless 0 0  PHQ - 2 Score 0 0     Fall Risk: Fall Risk  10/16/2015 04/13/2015  Falls in the past year? No No     Functional Status Survey: Is the patient deaf or have difficulty hearing?: No Does the patient have difficulty seeing, even when wearing glasses/contacts?: Yes (wears glasses) Does the patient have difficulty concentrating, remembering, or making decisions?: Yes (very mild) Does the patient have difficulty walking or climbing stairs?: Yes (uses her cane because of right knee pain/OA) Does the patient have difficulty dressing or bathing?: No Does the patient have difficulty doing errands alone such as visiting a doctor's office or shopping?: Yes    Assessment & Plan  1. Arteriosclerosis of coronary artery  Taking medication, sees Dr. Lorinda Creed, history of MI, taking statin and aspirin no symptoms of angina at this time. Continue Imdur - Lipid panel  2. Mild cognitive disorder  Stable, has 24 hour care. Lives with her daughter and someone is always home to provide for her. They manage her finances, transports her to doctors appointments, and dispense medication. She can feed and bath , change by herself.   3. Benign hypertension  - Comprehensive metabolic panel  4. Chronic kidney disease (CKD), stage V (HCC)  - Protein / creatinine ratio, urine - Comprehensive metabolic panel - VITAMIN D 25 Hydroxy (Vit-D Deficiency, Fractures) - Parathyroid hormone, intact (no Ca) - CBC with Differential/Platelet  5. Dyslipidemia  - Lipid panel  6. Controlled gout  -uric acid  7. History of peptic ulcer disease  - dexlansoprazole (DEXILANT) 60 MG capsule; Take 1 capsule (60 mg total) by mouth daily.  Dispense: 90 capsule; Refill: 3  8. Anemia of chronic disease  - CBC with Differential/Platelet  9. Other specified hypothyroidism  - TSH  10. Secondary hyperparathyroidism ([place])  - Parathyroid hormone, intact (no Ca)   11. Chronic stable angina (HCC)  Doing  well on Imdur, no symptoms with medication    12. Need for Tdap vaccination  - Tdap vaccine greater than or equal to 7yo IM

## 2015-10-17 LAB — CBC WITH DIFFERENTIAL/PLATELET
BASOS ABS: 0.1 10*3/uL (ref 0.0–0.2)
Basos: 1 %
EOS (ABSOLUTE): 0.3 10*3/uL (ref 0.0–0.4)
EOS: 3 %
HEMATOCRIT: 36.5 % (ref 34.0–46.6)
HEMOGLOBIN: 11.8 g/dL (ref 11.1–15.9)
IMMATURE GRANS (ABS): 0 10*3/uL (ref 0.0–0.1)
Immature Granulocytes: 0 %
LYMPHS ABS: 2.6 10*3/uL (ref 0.7–3.1)
LYMPHS: 33 %
MCH: 27.8 pg (ref 26.6–33.0)
MCHC: 32.3 g/dL (ref 31.5–35.7)
MCV: 86 fL (ref 79–97)
MONOCYTES: 8 %
Monocytes Absolute: 0.6 10*3/uL (ref 0.1–0.9)
NEUTROS ABS: 4.4 10*3/uL (ref 1.4–7.0)
Neutrophils: 55 %
Platelets: 292 10*3/uL (ref 150–379)
RBC: 4.25 x10E6/uL (ref 3.77–5.28)
RDW: 16 % — ABNORMAL HIGH (ref 12.3–15.4)
WBC: 8 10*3/uL (ref 3.4–10.8)

## 2015-10-17 LAB — COMPREHENSIVE METABOLIC PANEL
A/G RATIO: 1.3 (ref 1.1–2.5)
ALBUMIN: 4 g/dL (ref 3.5–4.7)
ALT: 6 IU/L (ref 0–32)
AST: 13 IU/L (ref 0–40)
Alkaline Phosphatase: 104 IU/L (ref 39–117)
BILIRUBIN TOTAL: 0.2 mg/dL (ref 0.0–1.2)
BUN / CREAT RATIO: 11 (ref 11–26)
BUN: 31 mg/dL — ABNORMAL HIGH (ref 8–27)
CALCIUM: 9 mg/dL (ref 8.7–10.3)
CHLORIDE: 106 mmol/L (ref 96–106)
CO2: 20 mmol/L (ref 18–29)
Creatinine, Ser: 2.76 mg/dL — ABNORMAL HIGH (ref 0.57–1.00)
GFR, EST AFRICAN AMERICAN: 17 mL/min/{1.73_m2} — AB (ref >59)
GFR, EST NON AFRICAN AMERICAN: 15 mL/min/{1.73_m2} — AB (ref >59)
Globulin, Total: 3 g/dL (ref 1.5–4.5)
Glucose: 85 mg/dL (ref 65–99)
POTASSIUM: 4.5 mmol/L (ref 3.5–5.2)
Sodium: 140 mmol/L (ref 134–144)
TOTAL PROTEIN: 7 g/dL (ref 6.0–8.5)

## 2015-10-17 LAB — LIPID PANEL
CHOLESTEROL TOTAL: 164 mg/dL (ref 100–199)
Chol/HDL Ratio: 2.7 ratio units (ref 0.0–4.4)
HDL: 60 mg/dL (ref >39)
LDL Calculated: 77 mg/dL (ref 0–99)
TRIGLYCERIDES: 135 mg/dL (ref 0–149)
VLDL Cholesterol Cal: 27 mg/dL (ref 5–40)

## 2015-10-17 LAB — TSH: TSH: 4.45 u[IU]/mL (ref 0.450–4.500)

## 2015-10-17 LAB — VITAMIN D 25 HYDROXY (VIT D DEFICIENCY, FRACTURES): Vit D, 25-Hydroxy: 18.4 ng/mL — ABNORMAL LOW (ref 30.0–100.0)

## 2015-10-17 LAB — PARATHYROID HORMONE, INTACT (NO CA): PTH: 490 pg/mL — AB (ref 15–65)

## 2015-10-17 LAB — URIC ACID: Uric Acid: 7.2 mg/dL — ABNORMAL HIGH (ref 2.5–7.1)

## 2015-10-18 ENCOUNTER — Ambulatory Visit: Payer: Medicare Other | Admitting: Family Medicine

## 2015-10-24 ENCOUNTER — Other Ambulatory Visit: Payer: Self-pay

## 2015-10-24 MED ORDER — LEVOTHYROXINE SODIUM 75 MCG PO TABS: 75.0000 ug | ORAL_TABLET | Freq: Every day | ORAL | Status: DC

## 2015-10-24 NOTE — Telephone Encounter (Signed)
Patient requesting refill. Daughter states she needs a refill of medication.

## 2015-11-15 ENCOUNTER — Other Ambulatory Visit: Payer: Self-pay | Admitting: Family Medicine

## 2015-11-15 NOTE — Telephone Encounter (Signed)
Patient requesting refill. 

## 2015-12-14 ENCOUNTER — Other Ambulatory Visit: Payer: Self-pay | Admitting: Family Medicine

## 2016-04-14 ENCOUNTER — Ambulatory Visit: Payer: Medicare Other | Admitting: Family Medicine

## 2016-04-22 ENCOUNTER — Other Ambulatory Visit: Payer: Self-pay | Admitting: Family Medicine

## 2016-04-22 DIAGNOSIS — I10 Essential (primary) hypertension: Secondary | ICD-10-CM

## 2016-04-22 NOTE — Telephone Encounter (Signed)
Patient requesting refill of Amlodipine 5 mg be sent to [id number].

## 2016-05-03 ENCOUNTER — Emergency Department: Payer: Medicare Other

## 2016-05-03 ENCOUNTER — Emergency Department
Admission: EM | Admit: 2016-05-03 | Discharge: 2016-05-03 | Disposition: A | Discharge status: Home / Self Care | Payer: Medicare Other | Attending: Emergency Medicine | Admitting: Emergency Medicine

## 2016-05-03 ENCOUNTER — Encounter: Payer: Self-pay | Admitting: *Deleted

## 2016-05-03 DIAGNOSIS — I120 Hypertensive chronic kidney disease with stage 5 chronic kidney disease or end stage renal disease: Secondary | ICD-10-CM | POA: Insufficient documentation

## 2016-05-03 DIAGNOSIS — Z79899 Other long term (current) drug therapy: Secondary | ICD-10-CM | POA: Diagnosis not present

## 2016-05-03 DIAGNOSIS — R531 Weakness: Secondary | ICD-10-CM | POA: Diagnosis not present

## 2016-05-03 DIAGNOSIS — R410 Disorientation, unspecified: Secondary | ICD-10-CM | POA: Insufficient documentation

## 2016-05-03 DIAGNOSIS — N390 Urinary tract infection, site not specified: Secondary | ICD-10-CM

## 2016-05-03 DIAGNOSIS — F17200 Nicotine dependence, unspecified, uncomplicated: Secondary | ICD-10-CM | POA: Insufficient documentation

## 2016-05-03 DIAGNOSIS — E039 Hypothyroidism, unspecified: Secondary | ICD-10-CM | POA: Insufficient documentation

## 2016-05-03 DIAGNOSIS — Z7982 Long term (current) use of aspirin: Secondary | ICD-10-CM | POA: Insufficient documentation

## 2016-05-03 DIAGNOSIS — N185 Chronic kidney disease, stage 5: Secondary | ICD-10-CM | POA: Diagnosis not present

## 2016-05-03 DIAGNOSIS — G309 Alzheimer's disease, unspecified: Secondary | ICD-10-CM | POA: Diagnosis not present

## 2016-05-03 DIAGNOSIS — J069 Acute upper respiratory infection, unspecified: Secondary | ICD-10-CM | POA: Insufficient documentation

## 2016-05-03 LAB — COMPREHENSIVE METABOLIC PANEL
ALK PHOS: 74 U/L (ref 38–126)
ALT: 7 U/L — AB (ref 14–54)
AST: 20 U/L (ref 15–41)
Albumin: 3.7 g/dL (ref 3.5–5.0)
Anion gap: 9 (ref 5–15)
BUN: 41 mg/dL — AB (ref 6–20)
CALCIUM: 8.6 mg/dL — AB (ref 8.9–10.3)
CO2: 23 mmol/L (ref 22–32)
CREATININE: 3.61 mg/dL — AB (ref 0.44–1.00)
Chloride: 108 mmol/L (ref 101–111)
GFR, EST AFRICAN AMERICAN: 12 mL/min — AB (ref >60)
GFR, EST NON AFRICAN AMERICAN: 10 mL/min — AB (ref >60)
Glucose, Bld: 139 mg/dL — ABNORMAL HIGH (ref 65–99)
Potassium: 3.9 mmol/L (ref 3.5–5.1)
SODIUM: 140 mmol/L (ref 135–145)
Total Bilirubin: 0.4 mg/dL (ref 0.3–1.2)
Total Protein: 7.5 g/dL (ref 6.5–8.1)

## 2016-05-03 LAB — CBC
HCT: 30.5 % — ABNORMAL LOW (ref 35.0–47.0)
Hemoglobin: 9.8 g/dL — ABNORMAL LOW (ref 12.0–16.0)
MCH: 26.7 pg (ref 26.0–34.0)
MCHC: 32.2 g/dL (ref 32.0–36.0)
MCV: 83 fL (ref 80.0–100.0)
PLATELETS: 234 10*3/uL (ref 150–440)
RBC: 3.68 MIL/uL — AB (ref 3.80–5.20)
RDW: 17.4 % — AB (ref 11.5–14.5)
WBC: 9.8 10*3/uL (ref 3.6–11.0)

## 2016-05-03 LAB — URINALYSIS COMPLETE WITH MICROSCOPIC (ARMC ONLY)
BILIRUBIN URINE: NEGATIVE
Bacteria, UA: NONE SEEN
GLUCOSE, UA: NEGATIVE mg/dL
Hgb urine dipstick: NEGATIVE
KETONES UR: NEGATIVE mg/dL
Leukocytes, UA: NEGATIVE
Nitrite: NEGATIVE
PH: 8 (ref 5.0–8.0)
Protein, ur: 30 mg/dL — AB
Specific Gravity, Urine: 1.006 (ref 1.005–1.030)

## 2016-05-03 MED ORDER — CEPHALEXIN 250 MG PO CAPS
250.0000 mg | ORAL_CAPSULE | Freq: Once | ORAL | Status: AC
  Administered 2016-05-03: 250 mg via ORAL
  Filled 2016-05-03: qty 1

## 2016-05-03 MED ORDER — CEPHALEXIN 250 MG PO CAPS: 250.0000 mg | ORAL_CAPSULE | Freq: Two times a day (BID) | ORAL | 0 refills | Status: AC

## 2016-05-03 MED ORDER — CEPHALEXIN 250 MG PO CAPS: 250.0000 mg | ORAL_CAPSULE | Freq: Every day | ORAL | 0 refills | Status: DC

## 2016-05-03 MED ORDER — BENZONATATE 100 MG PO CAPS: 100.0000 mg | ORAL_CAPSULE | Freq: Four times a day (QID) | ORAL | 0 refills | Status: DC | PRN

## 2016-05-03 MED ORDER — BENZONATATE 100 MG PO CAPS: 100.0000 mg | ORAL_CAPSULE | Freq: Two times a day (BID) | ORAL | 0 refills | Status: DC | PRN

## 2016-05-03 MED ORDER — BENZONATATE 100 MG PO CAPS
100.0000 mg | ORAL_CAPSULE | Freq: Once | ORAL | Status: AC
  Administered 2016-05-03: 100 mg via ORAL

## 2016-05-03 MED ORDER — BENZONATATE 100 MG PO CAPS
ORAL_CAPSULE | ORAL | Status: AC
  Administered 2016-05-03: 100 mg via ORAL
  Filled 2016-05-03: qty 1

## 2016-05-03 NOTE — ED Notes (Signed)
This RN assisted pt up to in room toilet to see if she could give [place] a urine sample. Pt attempted to pee without success. Reports that she does not feel like she has to pee at this time. Will talk to provider to see if pt may have something to drink to see if we can get a urine sample from patient.

## 2016-05-03 NOTE — ED Notes (Signed)
Discharge instructions reviewed with patient. Patient verbalized understanding. Patient taken to lobby via wheelchair without difficulty and helped into vehicle.

## 2016-05-03 NOTE — ED Provider Notes (Signed)
[facility] Emergency Department Provider Note  Time seen: 5:25 PM  I have reviewed the triage vital signs and the nursing notes.   HISTORY  Chief Complaint Weakness and Altered Mental Status    HPI [place] is a 82 y.o. female with a past medical history of mild dementia, CK D, hypertension, hyperlipidemia, who presents the emergency department with confusion and generalized weakness. According to the family the patient was on the front porch today, do not know where she was very confused and had an episode of urinary incontinence which is very abnormal for her. They do state for the past 4 days she has had a cough and congestion. Initially there is some confusion as to whether not she took her medications twice today, but another family member has arrived to states she does not believe the patient took her medication twice today. Daughter states today she felt very warm and was sweaty at times.  Past Medical History:  Diagnosis Date  . Allergy   . Alzheimer's disease   . Anemia   . Chronic kidney disease   . Gout   . H/O: GI bleed   . Hyperglycemia   . Hyperlipidemia   . Hypertension   . Incontinence   . Osteoporosis   . Thyroid disease   . Vitamin D deficiency     Patient Active Problem List   Diagnosis Date Noted  . Chronic stable angina ([place]) 10/16/2015  . Secondary hyperparathyroidism ([place]) 04/13/2015  . Arteriosclerosis of coronary artery 04/08/2015  . Benign hypertension 04/08/2015  . Chronic kidney disease (CKD), stage V ([place]) 04/08/2015  . Controlled gout 04/08/2015  . Dyslipidemia 04/08/2015  . History of peptic ulcer disease 04/08/2015  . Adult hypothyroidism 04/08/2015  . Mild cognitive disorder 04/08/2015  . Anemia of chronic disease 04/08/2015  . Osteoarthrosis involving more than one site but not generalized 04/08/2015  . Osteoporosis 04/08/2015  . Pelvic muscle wasting 04/08/2015  . Allergic rhinitis 04/08/2015  .  Tobacco use 04/08/2015  . CCF (congestive cardiac failure) ([place]) 03/29/2014  . HLD (hyperlipidemia) 03/29/2014  . H/O gastrointestinal hemorrhage 04/05/2007    Past Surgical History:  Procedure Laterality Date  . EXPLORATORY LAPAROTOMY    . EYE SURGERY Bilateral    cataract    Prior to Admission medications   Medication Sig Start Date End Date Taking? Authorizing Provider  allopurinol (ZYLOPRIM) 100 MG tablet Take 1 tablet by mouth daily. 12/26/09   Historical Provider, MD  amLODipine (NORVASC) 5 MG tablet TAKE 1 TABLET BY MOUTH EVERY DAY 04/22/16   Steele Sizer, MD  aspirin 81 MG tablet Take 1 tablet by mouth daily. 12/30/06   Historical Provider, MD  Cholecalciferol (VITAMIN D3) 2000 UNITS capsule Take 1 capsule by mouth daily.    Historical Provider, MD  dexlansoprazole (DEXILANT) 60 MG capsule Take 1 capsule (60 mg total) by mouth daily. 10/16/15   Steele Sizer, MD  furosemide (LASIX) 40 MG tablet TAKE 1 TABLET BY MOUTH DAILY 11/15/15   Steele Sizer, MD  isosorbide mononitrate (IMDUR) 60 MG 24 hr tablet Take 1 tablet by mouth daily.    Historical Provider, MD  levothyroxine (SYNTHROID) 75 MCG tablet Take 1 tablet (75 mcg total) by mouth daily at 6 (six) AM. 10/24/15   Steele Sizer, MD  metoprolol (LOPRESSOR) 50 MG tablet TAKE 1/2 TABLET BY MOUTH TWICE A DAY 05/26/15   Steele Sizer, MD  Multiple Vitamin (MULTI-VITAMINS) TABS Take by mouth. Reported on 10/16/2015  Historical Provider, MD  raloxifene (EVISTA) 60 MG tablet TAKE 1 TABLET BY MOUTH EVERY DAY 12/14/15   Steele Sizer, MD  rosuvastatin (CRESTOR) 10 MG tablet Take 1 tablet (10 mg total) by mouth daily. 04/13/15   Steele Sizer, MD  sodium bicarbonate 650 MG tablet TAKE 2 TABLETS (1,300 MG TOTAL) BY MOUTH TWO (2) TIMES A DAY. 03/19/15   Historical Provider, MD    No Known Allergies  Family History  Problem Relation Age of Onset  . Diabetes Sister   . Hypertension Sister     Social History Social History  Substance  Use Topics  . Smoking status: Never Smoker  . Smokeless tobacco: Current User    Types: Snuff  . Alcohol use No    Review of Systems Constitutional:No fever, but they state diaphoresis today with possible chills. Cardiovascular: Negative for chest pain. Respiratory: Negative for shortness of breath. Positive cough 4 days. Gastrointestinal: Negative for abdominal pain, vomiting and diarrhea. Genitourinary: Incontinence times one today. Musculoskeletal: Negative for back pain. Skin: Negative for rash. Neurological: Negative for headache 10-point ROS otherwise negative.  ____________________________________________   PHYSICAL EXAM:  VITAL SIGNS: ED Triage Vitals  Enc Vitals Group     BP 05/03/16 1702 (!) 122/58     Pulse Rate 05/03/16 1702 60     Resp 05/03/16 1702 18     Temp 05/03/16 1702 97.6 F (36.4 C)     Temp Source 05/03/16 1702 Oral     SpO2 05/03/16 1702 99 %     Weight 05/03/16 1700 140 lb (63.5 kg)     Height 05/03/16 1700 5' (1.524 m)     Head Circumference --      Peak Flow --      Pain Score --      Pain Loc --      Pain Edu? --      Excl. in [place]? --     Constitutional: Alert. No distress, but she does appear fatigued. Eyes: Normal exam ENT   Head: Normocephalic and atraumatic.   Mouth/Throat: Mucous membranes are moist. Cardiovascular: Normal rate, regular rhythm. No murmur Respiratory: Normal respiratory effort without tachypnea nor retractions. Breath sounds are clear  Gastrointestinal: Soft and nontender. No distention.   Musculoskeletal: Nontender with normal range of motion in all extremities.  Neurologic:  Normal speech and language. No gross focal neurologic deficits. Equal grip strengths. Skin:  Skin is warm, dry and intact.  Psychiatric: Mood and affect are normal.   ____________________________________________  RADIOLOGY  CT negative Chest x-ray shows no acute  abnormality.  ____________________________________________   INITIAL IMPRESSION / ASSESSMENT AND PLAN / ED COURSE  Pertinent labs & imaging results that were available during my care of the patient were reviewed by me and considered in my medical decision making (see chart for details).  The patient presents the emergency department with confusion, generalized weakness. We will check labs including urinalysis. Obtain a chest x-ray given 4 days of cough, and a CT scan of the head given acute onset of confusion today. Patient has good grip strengths bilaterally, follows all commands well and answers all questions appropriately.  Patient's labs are largely at her baseline, kidney function is somewhat deteriorated to largely unchanged for the past several years. Patient does have 6-30 WBCs along with episode of incontinence today we will treat for urinary tract infection pending a culture. I placed the patient on a 3 day course of Keflex 250 mg twice a day given her current GFR.  We will also prescribe Tessalon as a family states the patient is coughing and night and having difficulty sleeping which might have led to the confusion. Currently the patient appears well, has no complaints, family states she is acting normal and at her baseline.  ____________________________________________   FINAL CLINICAL IMPRESSION(S) / ED DIAGNOSES  Weakness Confusion    Harvest Dark, MD 05/03/16 2022

## 2016-05-03 NOTE — ED Notes (Signed)
Pt taken to CT by radiology staff.

## 2016-05-03 NOTE — ED Triage Notes (Addendum)
Pt to triage via wheelchair.  Family reports pt was on the porch and became disoriented and confused.  Family unsure if pt was dosed twice today with her medicines.  Pt denies headache or slurred speech.    Pt drowsy in triage.

## 2016-05-04 ENCOUNTER — Encounter: Payer: Self-pay | Admitting: Family Medicine

## 2016-05-04 DIAGNOSIS — I700 Atherosclerosis of aorta: Secondary | ICD-10-CM | POA: Insufficient documentation

## 2016-05-04 DIAGNOSIS — K449 Diaphragmatic hernia without obstruction or gangrene: Secondary | ICD-10-CM | POA: Insufficient documentation

## 2016-05-12 DIAGNOSIS — D631 Anemia in chronic kidney disease: Secondary | ICD-10-CM | POA: Diagnosis not present

## 2016-05-12 DIAGNOSIS — N189 Chronic kidney disease, unspecified: Secondary | ICD-10-CM | POA: Diagnosis not present

## 2016-05-12 DIAGNOSIS — N185 Chronic kidney disease, stage 5: Secondary | ICD-10-CM | POA: Diagnosis not present

## 2016-05-12 DIAGNOSIS — I120 Hypertensive chronic kidney disease with stage 5 chronic kidney disease or end stage renal disease: Secondary | ICD-10-CM | POA: Diagnosis not present

## 2016-05-13 ENCOUNTER — Ambulatory Visit (INDEPENDENT_AMBULATORY_CARE_PROVIDER_SITE_OTHER): Payer: Medicare Other | Admitting: Family Medicine

## 2016-05-13 ENCOUNTER — Encounter: Payer: Self-pay | Admitting: Family Medicine

## 2016-05-13 VITALS — BP 114/62 | HR 79 | Temp 98.0°F | Resp 16 | Ht 60.0 in | Wt 122.9 lb

## 2016-05-13 DIAGNOSIS — D692 Other nonthrombocytopenic purpura: Secondary | ICD-10-CM

## 2016-05-13 DIAGNOSIS — I209 Angina pectoris, unspecified: Secondary | ICD-10-CM

## 2016-05-13 DIAGNOSIS — Z23 Encounter for immunization: Secondary | ICD-10-CM | POA: Diagnosis not present

## 2016-05-13 DIAGNOSIS — E038 Other specified hypothyroidism: Secondary | ICD-10-CM

## 2016-05-13 DIAGNOSIS — J069 Acute upper respiratory infection, unspecified: Secondary | ICD-10-CM | POA: Diagnosis not present

## 2016-05-13 DIAGNOSIS — M109 Gout, unspecified: Secondary | ICD-10-CM

## 2016-05-13 DIAGNOSIS — F068 Other specified mental disorders due to known physiological condition: Secondary | ICD-10-CM | POA: Diagnosis not present

## 2016-05-13 DIAGNOSIS — N185 Chronic kidney disease, stage 5: Secondary | ICD-10-CM | POA: Diagnosis not present

## 2016-05-13 DIAGNOSIS — E785 Hyperlipidemia, unspecified: Secondary | ICD-10-CM

## 2016-05-13 DIAGNOSIS — D638 Anemia in other chronic diseases classified elsewhere: Secondary | ICD-10-CM

## 2016-05-13 DIAGNOSIS — F09 Unspecified mental disorder due to known physiological condition: Secondary | ICD-10-CM

## 2016-05-13 DIAGNOSIS — I10 Essential (primary) hypertension: Secondary | ICD-10-CM

## 2016-05-13 DIAGNOSIS — I2510 Atherosclerotic heart disease of native coronary artery without angina pectoris: Secondary | ICD-10-CM

## 2016-05-13 DIAGNOSIS — I208 Other forms of angina pectoris: Secondary | ICD-10-CM

## 2016-05-13 DIAGNOSIS — I2089 Other forms of angina pectoris: Secondary | ICD-10-CM

## 2016-05-13 MED ORDER — FUROSEMIDE 40 MG PO TABS: 40.0000 mg | ORAL_TABLET | Freq: Every day | ORAL | 1 refills | Status: DC

## 2016-05-13 MED ORDER — ISOSORBIDE MONONITRATE ER 60 MG PO TB24: 60.0000 mg | ORAL_TABLET | Freq: Every day | ORAL | 2 refills | Status: DC

## 2016-05-13 MED ORDER — ALLOPURINOL 100 MG PO TABS: 100.0000 mg | ORAL_TABLET | Freq: Every day | ORAL | 2 refills | Status: DC

## 2016-05-13 NOTE — Progress Notes (Signed)
Name: Crystal Faulkner   MRN: [id number]    DOB: September 07, 1924   Date:05/13/2016       Progress Note  Subjective  Chief Complaint  Chief Complaint  Patient presents with  . Medication Refill    6 month F/U, Metoprolol on her bottle states 1 tablet bid but on computer was put in 1/2 tablet bid. Please clarify prescription.  . Hypothyroidism  . Weight Loss    Patient has been very weak and fatigue since having a bad cold 2 weeks ago. She has lost 18 pounds since last visit due to being sick and having no appetite.   Patient went to the ER and was given antibiotics for 4 day and cough medication.  . Hypertension    Patient does not check her BP at home.   Marland Kitchen Hyperlipidemia  . Gastroesophageal Reflux    Controlled with medication    HPI  Weight loss: not accurate. The last weight was checked at [facility] at 140 lbs, however based on her last visit in our office she only lost a few pounds. Daughter states she did not eat for a few days when she had severe symptoms of URI, however her appetite is back to normal and her clothes seems to be fitting her well. She is doing better, cough has improved and finished antibiotics given by [facility]  Hypothyroidism: taking medication daily as prescribed, no constipation, she has dry skin but controlled with lotion   Senile purpura: bruises easily and takes aspirin, explained common to have it at her age  HTN: bp is towards low end of normal, she has bene instructed to take half pill of metoprolol twice daily but is still taking it one pill twice daily, no palpitation or edema, taking furosemide  Angina: taking IMdur daily and denies chest pain, will see cardiologist soon  GERD: under control  Mild cognitive impairment: stable, no wondering, able to take shower, eats by herself, but needs help coming to the doctor and family member manages her money and dispense her medication  CKI with anemia or chronic disease: seeing nephrologist, refuses HD, no pruritus.    Controlled Gout: no recent episodes  Atherosclerosis aorta: taking aspirin and statin therapy  Patient Active Problem List   Diagnosis Date Noted  . Hiatal hernia 05/04/2016  . Thoracic aorta atherosclerosis ([place]) 05/04/2016  . Chronic stable angina ([place]) 10/16/2015  . Secondary hyperparathyroidism ([place]) 04/13/2015  . Arteriosclerosis of coronary artery 04/08/2015  . Benign hypertension 04/08/2015  . Chronic kidney disease (CKD), stage V ([place]) 04/08/2015  . Controlled gout 04/08/2015  . Dyslipidemia 04/08/2015  . History of peptic ulcer disease 04/08/2015  . Adult hypothyroidism 04/08/2015  . Mild cognitive disorder 04/08/2015  . Anemia of chronic disease 04/08/2015  . Osteoarthrosis involving more than one site but not generalized 04/08/2015  . Osteoporosis 04/08/2015  . Pelvic muscle wasting 04/08/2015  . Allergic rhinitis 04/08/2015  . Tobacco use 04/08/2015  . CCF (congestive cardiac failure) ([place]) 03/29/2014  . HLD (hyperlipidemia) 03/29/2014  . H/O gastrointestinal hemorrhage 04/05/2007    Past Surgical History:  Procedure Laterality Date  . EXPLORATORY LAPAROTOMY    . EYE SURGERY Bilateral    cataract    Family History  Problem Relation Age of Onset  . Diabetes Sister   . Hypertension Sister     Social History   Social History  . Marital status: Widowed    Spouse name: N/A  . Number of children: N/A  . Years of education: N/A  Occupational History  . Not on file.   Social History Main Topics  . Smoking status: Never Smoker  . Smokeless tobacco: Current User    Types: Snuff  . Alcohol use No  . Drug use: No  . Sexual activity: No   Other Topics Concern  . Not on file   Social History Narrative  . No narrative on file     Current Outpatient Prescriptions:  .  allopurinol (ZYLOPRIM) 100 MG tablet, Take 1 tablet (100 mg total) by mouth daily., Disp: 90 tablet, Rfl: 2 .  amLODipine (NORVASC) 5 MG tablet, TAKE 1 TABLET BY MOUTH EVERY DAY,  Disp: 90 tablet, Rfl: 1 .  aspirin 81 MG tablet, Take 1 tablet by mouth daily., Disp: , Rfl:  .  benzonatate (TESSALON PERLES) 100 MG capsule, Take 1 capsule (100 mg total) by mouth every 6 (six) hours as needed for cough., Disp: 30 capsule, Rfl: 0 .  Cholecalciferol (VITAMIN D3) 2000 UNITS capsule, Take 1 capsule by mouth daily., Disp: , Rfl:  .  dexlansoprazole (DEXILANT) 60 MG capsule, Take 1 capsule (60 mg total) by mouth daily., Disp: 90 capsule, Rfl: 3 .  furosemide (LASIX) 40 MG tablet, Take 1 tablet (40 mg total) by mouth daily., Disp: 90 tablet, Rfl: 1 .  isosorbide mononitrate (IMDUR) 60 MG 24 hr tablet, Take 1 tablet (60 mg total) by mouth daily., Disp: 90 tablet, Rfl: 2 .  levothyroxine (SYNTHROID) 75 MCG tablet, Take 1 tablet (75 mcg total) by mouth daily at 6 (six) AM., Disp: 90 tablet, Rfl: 1 .  metoprolol (LOPRESSOR) 50 MG tablet, TAKE 1/2 TABLET BY MOUTH TWICE A DAY, Disp: 90 tablet, Rfl: 1 .  Multiple Vitamin (MULTI-VITAMINS) TABS, Take by mouth. Reported on 10/16/2015, Disp: , Rfl:  .  raloxifene (EVISTA) 60 MG tablet, TAKE 1 TABLET BY MOUTH EVERY DAY, Disp: 90 tablet, Rfl: 2 .  rosuvastatin (CRESTOR) 10 MG tablet, Take 1 tablet (10 mg total) by mouth daily., Disp: 90 tablet, Rfl: 4 .  sodium bicarbonate 650 MG tablet, TAKE 2 TABLETS (1,300 MG TOTAL) BY MOUTH TWO (2) TIMES A DAY., Disp: , Rfl: 11  No Known Allergies   ROS  Ten systems reviewed and is negative except as mentioned in HPI   Objective  Vitals:   05/13/16 1159  BP: 114/62  Pulse: 79  Resp: 16  Temp: 98 F (36.7 C)  TempSrc: Oral  SpO2: 95%  Weight: 122 lb 14.4 oz (55.7 kg)  Height: 5' (1.524 m)    Body mass index is 24 kg/m.  Physical Exam  Constitutional: Patient appears well-developed and well-nourished.No distress.  HEENT: head atraumatic, normocephalic, pupils equal and reactive to light, neck supple, throat within normal limits Cardiovascular: Normal rate, regular rhythm and normal heart  sounds.  No murmur heard. Trace  BLE edema. Pulmonary/Chest: Effort normal and breath sounds normal. No respiratory distress. Abdominal: Soft.  There is no tenderness. Psychiatric: Patient has a normal mood and affect. behavior is normal.  Muscular Skeletal: grinding with extension of right knee, uses a cane to assist with ambulation   Recent Results (from the past 2160 hour(s))  Comprehensive metabolic panel     Status: Abnormal   Collection Time: 05/03/16  5:17 PM  Result Value Ref Range   Sodium 140 135 - 145 mmol/L   Potassium 3.9 3.5 - 5.1 mmol/L   Chloride 108 101 - 111 mmol/L   CO2 23 22 - 32 mmol/L   Glucose, Bld 139 (H)  65 - 99 mg/dL   BUN 41 (H) 6 - 20 mg/dL   Creatinine, Ser 3.61 (H) 0.44 - 1.00 mg/dL   Calcium 8.6 (L) 8.9 - 10.3 mg/dL   Total Protein 7.5 6.5 - 8.1 g/dL   Albumin 3.7 3.5 - 5.0 g/dL   AST 20 15 - 41 U/L   ALT 7 (L) 14 - 54 U/L   Alkaline Phosphatase 74 38 - 126 U/L   Total Bilirubin 0.4 0.3 - 1.2 mg/dL   GFR calc non Af Amer 10 (L) >60 mL/min   GFR calc Af Amer 12 (L) >60 mL/min    Comment: (NOTE) The eGFR has been calculated using the CKD EPI equation. This calculation has not been validated in all clinical situations. eGFR's persistently <60 mL/min signify possible Chronic Kidney Disease.    Anion gap 9 5 - 15  CBC     Status: Abnormal   Collection Time: 05/03/16  5:17 PM  Result Value Ref Range   WBC 9.8 3.6 - 11.0 K/uL   RBC 3.68 (L) 3.80 - 5.20 MIL/uL   Hemoglobin 9.8 (L) 12.0 - 16.0 g/dL   HCT 30.5 (L) 35.0 - 47.0 %   MCV 83.0 80.0 - 100.0 fL   MCH 26.7 26.0 - 34.0 pg   MCHC 32.2 32.0 - 36.0 g/dL   RDW 17.4 (H) 11.5 - 14.5 %   Platelets 234 150 - 440 K/uL  Urinalysis complete, with microscopic ([facility] only)     Status: Abnormal   Collection Time: 05/03/16  7:30 PM  Result Value Ref Range   Color, Urine STRAW (A) YELLOW   APPearance CLEAR (A) CLEAR   Glucose, UA NEGATIVE NEGATIVE mg/dL   Bilirubin Urine NEGATIVE NEGATIVE   Ketones, ur  NEGATIVE NEGATIVE mg/dL   Specific Gravity, Urine 1.006 1.005 - 1.030   Hgb urine dipstick NEGATIVE NEGATIVE   pH 8.0 5.0 - 8.0   Protein, ur 30 (A) NEGATIVE mg/dL   Nitrite NEGATIVE NEGATIVE   Leukocytes, UA NEGATIVE NEGATIVE   RBC / HPF 0-5 0 - 5 RBC/hpf   WBC, UA 6-30 0 - 5 WBC/hpf   Bacteria, UA NONE SEEN NONE SEEN   Squamous Epithelial / LPF 0-5 (A) NONE SEEN   Mucous PRESENT       PHQ2/9: Depression screen [facility] 2/9 05/13/2016 10/16/2015 04/13/2015  Decreased Interest 0 0 0  Down, Depressed, Hopeless 0 0 0  PHQ - 2 Score 0 0 0    Fall Risk: Fall Risk  05/13/2016 10/16/2015 04/13/2015  Falls in the past year? No No No    Functional Status Survey: Is the patient deaf or have difficulty hearing?: Yes (Slight hearing loss-daughter states you have to yell when speaking to her on the phone) Does the patient have difficulty seeing, even when wearing glasses/contacts?: No Does the patient have difficulty concentrating, remembering, or making decisions?: No Does the patient have difficulty walking or climbing stairs?: Yes (Has trouble walking and walks with a cane) Does the patient have difficulty dressing or bathing?: No Does the patient have difficulty doing errands alone such as visiting a doctor's office or shopping?: Yes (Patient does not drive)    Assessment & Plan  1. Arteriosclerosis of coronary artery  Continue statin and aspirin  2. Mild cognitive disorder  stable  3. Benign hypertension  Decrease metoprolol to half dose twice daily   4. Chronic kidney disease (CKD), stage V (HCC)  - furosemide (LASIX) 40 MG tablet; Take 1 tablet (40  mg total) by mouth daily.  Dispense: 90 tablet; Refill: 1  5. Dyslipidemia  Continue medication   6. Controlled gout  - allopurinol (ZYLOPRIM) 100 MG tablet; Take 1 tablet (100 mg total) by mouth daily.  Dispense: 90 tablet; Refill: 2  7. Anemia of chronic disease  Mild drop   8. Other specified  hypothyroidism  Continue medication   9. Chronic stable angina (HCC)  - isosorbide mononitrate (IMDUR) 60 MG 24 hr tablet; Take 1 tablet (60 mg total) by mouth daily.  Dispense: 90 tablet; Refill: 2 - furosemide (LASIX) 40 MG tablet; Take 1 tablet (40 mg total) by mouth daily.  Dispense: 90 tablet; Refill: 1  10. Upper respiratory infection  Doing better  11. Needs flu shot  - Flu vaccine HIGH DOSE PF

## 2016-05-26 DIAGNOSIS — I5032 Chronic diastolic (congestive) heart failure: Secondary | ICD-10-CM | POA: Diagnosis not present

## 2016-05-26 DIAGNOSIS — I10 Essential (primary) hypertension: Secondary | ICD-10-CM | POA: Diagnosis not present

## 2016-05-26 DIAGNOSIS — E7800 Pure hypercholesterolemia, unspecified: Secondary | ICD-10-CM | POA: Diagnosis not present

## 2016-05-26 DIAGNOSIS — I214 Non-ST elevation (NSTEMI) myocardial infarction: Secondary | ICD-10-CM | POA: Diagnosis not present

## 2016-05-26 DIAGNOSIS — R0602 Shortness of breath: Secondary | ICD-10-CM | POA: Diagnosis not present

## 2016-06-13 ENCOUNTER — Ambulatory Visit: Payer: Self-pay | Admitting: Family Medicine

## 2016-06-13 ENCOUNTER — Other Ambulatory Visit: Payer: Self-pay | Admitting: Family Medicine

## 2016-06-13 ENCOUNTER — Encounter: Payer: Self-pay | Admitting: Family Medicine

## 2016-06-13 ENCOUNTER — Ambulatory Visit (INDEPENDENT_AMBULATORY_CARE_PROVIDER_SITE_OTHER): Payer: Medicare Other | Admitting: Family Medicine

## 2016-06-13 VITALS — BP 128/68 | HR 91 | Temp 97.8°F | Resp 16 | Wt 114.4 lb

## 2016-06-13 DIAGNOSIS — Z9183 Wandering in diseases classified elsewhere: Secondary | ICD-10-CM

## 2016-06-13 DIAGNOSIS — I2510 Atherosclerotic heart disease of native coronary artery without angina pectoris: Secondary | ICD-10-CM

## 2016-06-13 DIAGNOSIS — E038 Other specified hypothyroidism: Secondary | ICD-10-CM

## 2016-06-13 DIAGNOSIS — M170 Bilateral primary osteoarthritis of knee: Secondary | ICD-10-CM

## 2016-06-13 DIAGNOSIS — Z87440 Personal history of urinary (tract) infections: Secondary | ICD-10-CM

## 2016-06-13 DIAGNOSIS — R4689 Other symptoms and signs involving appearance and behavior: Secondary | ICD-10-CM

## 2016-06-13 DIAGNOSIS — N185 Chronic kidney disease, stage 5: Secondary | ICD-10-CM | POA: Diagnosis not present

## 2016-06-13 DIAGNOSIS — R296 Repeated falls: Secondary | ICD-10-CM

## 2016-06-13 DIAGNOSIS — R32 Unspecified urinary incontinence: Secondary | ICD-10-CM | POA: Diagnosis not present

## 2016-06-13 LAB — CBC WITH DIFFERENTIAL/PLATELET
BASOS ABS: 77 {cells}/uL (ref 0–200)
BASOS PCT: 1 %
EOS ABS: 154 {cells}/uL (ref 15–500)
Eosinophils Relative: 2 %
HCT: 31.1 % — ABNORMAL LOW (ref 35.0–45.0)
HEMOGLOBIN: 9.6 g/dL — AB (ref 11.7–15.5)
LYMPHS ABS: 1848 {cells}/uL (ref 850–3900)
Lymphocytes Relative: 24 %
MCH: 24.9 pg — AB (ref 27.0–33.0)
MCHC: 30.9 g/dL — ABNORMAL LOW (ref 32.0–36.0)
MCV: 80.8 fL (ref 80.0–100.0)
MONO ABS: 539 {cells}/uL (ref 200–950)
MONOS PCT: 7 %
MPV: 10 fL (ref 7.5–12.5)
NEUTROS ABS: 5082 {cells}/uL (ref 1500–7800)
Neutrophils Relative %: 66 %
PLATELETS: 350 10*3/uL (ref 140–400)
RBC: 3.85 MIL/uL (ref 3.80–5.10)
RDW: 17.8 % — ABNORMAL HIGH (ref 11.0–15.0)
WBC: 7.7 10*3/uL (ref 3.8–10.8)

## 2016-06-13 LAB — POCT URINALYSIS DIPSTICK
Bilirubin, UA: NEGATIVE
Glucose, UA: NEGATIVE
KETONES UA: NEGATIVE
Nitrite, UA: NEGATIVE
PH UA: 8
SPEC GRAV UA: 1.025
UROBILINOGEN UA: NEGATIVE

## 2016-06-13 LAB — COMPLETE METABOLIC PANEL WITH GFR
ALT: 5 U/L — ABNORMAL LOW (ref 6–29)
AST: 15 U/L (ref 10–35)
Albumin: 4.1 g/dL (ref 3.6–5.1)
Alkaline Phosphatase: 81 U/L (ref 33–130)
BILIRUBIN TOTAL: 0.3 mg/dL (ref 0.2–1.2)
BUN: 40 mg/dL — ABNORMAL HIGH (ref 7–25)
CHLORIDE: 107 mmol/L (ref 98–110)
CO2: 22 mmol/L (ref 20–31)
Calcium: 9 mg/dL (ref 8.6–10.4)
Creat: 3.69 mg/dL — ABNORMAL HIGH (ref 0.60–0.88)
GFR, EST AFRICAN AMERICAN: 12 mL/min — AB (ref >=60)
GFR, EST NON AFRICAN AMERICAN: 10 mL/min — AB (ref >=60)
GLUCOSE: 89 mg/dL (ref 65–99)
POTASSIUM: 4.3 mmol/L (ref 3.5–5.3)
SODIUM: 142 mmol/L (ref 135–146)
TOTAL PROTEIN: 7.3 g/dL (ref 6.1–8.1)

## 2016-06-13 LAB — TSH: TSH: 1.03 mIU/L

## 2016-06-13 MED ORDER — CIPROFLOXACIN HCL 250 MG PO TABS: 250.0000 mg | ORAL_TABLET | Freq: Every day | ORAL | 0 refills | Status: DC

## 2016-06-13 NOTE — Progress Notes (Signed)
Name: Crystal Faulkner   MRN: [id number]    DOB: 03/13/1925   Date:06/13/2016       Progress Note  Subjective  Chief Complaint  Chief Complaint  Patient presents with  . Fall    pt has been falling and been confused gradually getting worse since Saturday  . Urinary Tract Infection    HPI  Recurrent Falls: she had similar episode in 04/2016 and went to [facility], at the time had a CT that was negative for acute changes. She was sent home with medication for cough and UTI and symptoms of incontinence and falls resolved. However 3 days ago she got up from her bed and seems like she fell because her granddaughter found her wet in the living room because she crawled from her bedroom to ask for help. Her granddaughter got her up with assistance of her cousin and she was to walk and did not complain of pain after that. She fell again today, and was staggering while walking this morning. She denies pain or dizziness. She has daily pain on right knee ( but normal pain for her - sees [facility] ) She has CKI stage V but refuses HD. She had one episode of wandering at night a few weeks ago. She has also a decrease in appetite. No change in personality. Very poor historian. History given by granddaughter Barbara Cower )   Patient Active Problem List   Diagnosis Date Noted  . Senile purpura ([place]) 05/13/2016  . Hiatal hernia 05/04/2016  . Thoracic aorta atherosclerosis ([place]) 05/04/2016  . Chronic stable angina ([place]) 10/16/2015  . Secondary hyperparathyroidism ([place]) 04/13/2015  . Arteriosclerosis of coronary artery 04/08/2015  . Benign hypertension 04/08/2015  . Chronic kidney disease (CKD), stage V ([place]) 04/08/2015  . Controlled gout 04/08/2015  . Dyslipidemia 04/08/2015  . History of peptic ulcer disease 04/08/2015  . Adult hypothyroidism 04/08/2015  . Mild cognitive disorder 04/08/2015  . Anemia of chronic disease 04/08/2015  . Osteoarthrosis involving more than one site but not generalized  04/08/2015  . Osteoporosis 04/08/2015  . Pelvic muscle wasting 04/08/2015  . Allergic rhinitis 04/08/2015  . Tobacco use 04/08/2015  . CCF (congestive cardiac failure) ([place]) 03/29/2014  . HLD (hyperlipidemia) 03/29/2014  . H/O gastrointestinal hemorrhage 04/05/2007    Past Surgical History:  Procedure Laterality Date  . EXPLORATORY LAPAROTOMY    . EYE SURGERY Bilateral    cataract    Family History  Problem Relation Age of Onset  . Diabetes Sister   . Hypertension Sister     Social History   Social History  . Marital status: Widowed    Spouse name: N/A  . Number of children: N/A  . Years of education: N/A   Occupational History  . Not on file.   Social History Main Topics  . Smoking status: Never Smoker  . Smokeless tobacco: Current User    Types: Snuff  . Alcohol use No  . Drug use: No  . Sexual activity: No   Other Topics Concern  . Not on file   Social History Narrative  . No narrative on file     Current Outpatient Prescriptions:  .  allopurinol (ZYLOPRIM) 100 MG tablet, Take 1 tablet (100 mg total) by mouth daily., Disp: 90 tablet, Rfl: 2 .  amLODipine (NORVASC) 5 MG tablet, TAKE 1 TABLET BY MOUTH EVERY DAY, Disp: 90 tablet, Rfl: 1 .  aspirin 81 MG tablet, Take 1 tablet by mouth daily., Disp: , Rfl:  .  Cholecalciferol (VITAMIN D3) 2000 UNITS capsule, Take 1 capsule by mouth daily., Disp: , Rfl:  .  ciprofloxacin (CIPRO) 250 MG tablet, Take 1 tablet (250 mg total) by mouth daily., Disp: 5 tablet, Rfl: 0 .  dexlansoprazole (DEXILANT) 60 MG capsule, Take 1 capsule (60 mg total) by mouth daily., Disp: 90 capsule, Rfl: 3 .  furosemide (LASIX) 40 MG tablet, Take 1 tablet (40 mg total) by mouth daily., Disp: 90 tablet, Rfl: 1 .  isosorbide mononitrate (IMDUR) 60 MG 24 hr tablet, Take 1 tablet (60 mg total) by mouth daily., Disp: 90 tablet, Rfl: 2 .  levothyroxine (SYNTHROID) 75 MCG tablet, Take 1 tablet (75 mcg total) by mouth daily at 6 (six) AM., Disp: 90  tablet, Rfl: 1 .  metoprolol (LOPRESSOR) 50 MG tablet, TAKE 1/2 TABLET BY MOUTH TWICE A DAY, Disp: 90 tablet, Rfl: 1 .  Multiple Vitamin (MULTI-VITAMINS) TABS, Take by mouth. Reported on 10/16/2015, Disp: , Rfl:  .  raloxifene (EVISTA) 60 MG tablet, TAKE 1 TABLET BY MOUTH EVERY DAY, Disp: 90 tablet, Rfl: 2 .  rosuvastatin (CRESTOR) 10 MG tablet, Take 1 tablet (10 mg total) by mouth daily., Disp: 90 tablet, Rfl: 4 .  sodium bicarbonate 650 MG tablet, TAKE 2 TABLETS (1,300 MG TOTAL) BY MOUTH TWO (2) TIMES A DAY., Disp: , Rfl: 11  No Known Allergies   ROS  Constitutional: Negative for fever or weight change.  Respiratory: Negative for cough and shortness of breath.   Cardiovascular: Negative for chest pain or palpitations.  Gastrointestinal: Negative for abdominal pain, no bowel changes.  Musculoskeletal: Positive  for gait problem intermittent  joint swelling right knee .  Skin: Negative for rash.  Neurological: Negative for dizziness or headache.  No other specific complaints in a complete review of systems (except as listed in HPI above).  Objective  Vitals:   06/13/16 1101  BP: 128/68  Pulse: 91  Resp: 16  Temp: 97.8 F (36.6 C)  SpO2: 95%  Weight: 114 lb 6 oz (51.9 kg)    Body mass index is 22.34 kg/m.  Physical Exam  Constitutional: Patient appears well-developed and well-nourished.  No distress.  HEENT: head atraumatic, normocephalic, pupils equal and reactive to light,  neck supple, throat within normal limits Cardiovascular: Normal rate, regular rhythm and normal heart sounds.  No murmur heard. No BLE edema. Pulmonary/Chest: Effort normal and breath sounds normal. No respiratory distress. Abdominal: Soft.  There is no tenderness. Psychiatric: Patient has a normal mood and affect. behavior is normal. Judgment and thought content normal. Muscular Skeletal: crepitus both knees, decrease extension right knee,has a cane, antalgic gait Neurologist: romberg negative,  normal sensation, cranial nerves intact  Recent Results (from the past 2160 hour(s))  Comprehensive metabolic panel     Status: Abnormal   Collection Time: 05/03/16  5:17 PM  Result Value Ref Range   Sodium 140 135 - 145 mmol/L   Potassium 3.9 3.5 - 5.1 mmol/L   Chloride 108 101 - 111 mmol/L   CO2 23 22 - 32 mmol/L   Glucose, Bld 139 (H) 65 - 99 mg/dL   BUN 41 (H) 6 - 20 mg/dL   Creatinine, Ser 3.61 (H) 0.44 - 1.00 mg/dL   Calcium 8.6 (L) 8.9 - 10.3 mg/dL   Total Protein 7.5 6.5 - 8.1 g/dL   Albumin 3.7 3.5 - 5.0 g/dL   AST 20 15 - 41 U/L   ALT 7 (L) 14 - 54 U/L   Alkaline Phosphatase 74 38 -  126 U/L   Total Bilirubin 0.4 0.3 - 1.2 mg/dL   GFR calc non Af Amer 10 (L) >60 mL/min   GFR calc Af Amer 12 (L) >60 mL/min    Comment: (NOTE) The eGFR has been calculated using the CKD EPI equation. This calculation has not been validated in all clinical situations. eGFR's persistently <60 mL/min signify possible Chronic Kidney Disease.    Anion gap 9 5 - 15  CBC     Status: Abnormal   Collection Time: 05/03/16  5:17 PM  Result Value Ref Range   WBC 9.8 3.6 - 11.0 K/uL   RBC 3.68 (L) 3.80 - 5.20 MIL/uL   Hemoglobin 9.8 (L) 12.0 - 16.0 g/dL   HCT 30.5 (L) 35.0 - 47.0 %   MCV 83.0 80.0 - 100.0 fL   MCH 26.7 26.0 - 34.0 pg   MCHC 32.2 32.0 - 36.0 g/dL   RDW 17.4 (H) 11.5 - 14.5 %   Platelets 234 150 - 440 K/uL  Urinalysis complete, with microscopic ([facility] only)     Status: Abnormal   Collection Time: 05/03/16  7:30 PM  Result Value Ref Range   Color, Urine STRAW (A) YELLOW   APPearance CLEAR (A) CLEAR   Glucose, UA NEGATIVE NEGATIVE mg/dL   Bilirubin Urine NEGATIVE NEGATIVE   Ketones, ur NEGATIVE NEGATIVE mg/dL   Specific Gravity, Urine 1.006 1.005 - 1.030   Hgb urine dipstick NEGATIVE NEGATIVE   pH 8.0 5.0 - 8.0   Protein, ur 30 (A) NEGATIVE mg/dL   Nitrite NEGATIVE NEGATIVE   Leukocytes, UA NEGATIVE NEGATIVE   RBC / HPF 0-5 0 - 5 RBC/hpf   WBC, UA 6-30 0 - 5 WBC/hpf    Bacteria, UA NONE SEEN NONE SEEN   Squamous Epithelial / LPF 0-5 (A) NONE SEEN   Mucous PRESENT   POCT urinalysis dipstick     Status: Abnormal   Collection Time: 06/13/16 11:07 AM  Result Value Ref Range   Color, UA yellow    Clarity, UA clear    Glucose, UA neg    Bilirubin, UA neg    Ketones, UA neg    Spec Grav, UA 1.025    Blood, UA trace    pH, UA 8.0    Protein, UA trace    Urobilinogen, UA negative    Nitrite, UA neg    Leukocytes, UA large (3+) (A) Negative     PHQ2/9: Depression screen [facility] 2/9 06/13/2016 05/13/2016 10/16/2015 04/13/2015  Decreased Interest 0 0 0 0  Down, Depressed, Hopeless 0 0 0 0  PHQ - 2 Score 0 0 0 0     Fall Risk: Fall Risk  06/13/2016 05/13/2016 10/16/2015 04/13/2015  Falls in the past year? Yes No No No  Number falls in past yr: 2 or more - - -  Injury with Fall? Yes - - -  Risk Factor Category  High Fall Risk - - -  Follow up Falls evaluation completed - - -      Functional Status Survey: Is the patient deaf or have difficulty hearing?: Yes Does the patient have difficulty seeing, even when wearing glasses/contacts?: No Does the patient have difficulty concentrating, remembering, or making decisions?: Yes Does the patient have difficulty walking or climbing stairs?: Yes Does the patient have difficulty dressing or bathing?: Yes Does the patient have difficulty doing errands alone such as visiting a doctor's office or shopping?: Yes   Assessment & Plan  1. Urinary incontinence in female  -  POCT urinalysis dipstick - CULTURE, URINE COMPREHENSIVE - ciprofloxacin (CIPRO) 250 MG tablet; Take 1 tablet (250 mg total) by mouth daily.  Dispense: 5 tablet; Refill: 0  2. Recurrent falls  Recent trip to [facility] 04/2016 and had CT scan that was unremarkable, she was treated for URI possible UTI and symptoms improved. She also has some confusion, had one episode of wandering at night, no change in personality, cooperative. She does complain of any  pain, but has fallen many times for the past few days, she had also had one episode of urinary incontinence last week. Take her to [facility] if symptoms returns. Discussed hospice but they want to hold off for now. Advised not to let her walk without assistance until she gets better - CBC with Differential/Platelet - COMPLETE METABOLIC PANEL WITH GFR - TSH - Ambulatory referral to Neurology - Ambulatory referral to Physical Therapy  3. Wandering  - Ambulatory referral to Neurology  4. History of UTI  - CULTURE, URINE COMPREHENSIVE - ciprofloxacin (CIPRO) 250 MG tablet; Take 1 tablet (250 mg total) by mouth daily.  Dispense: 5 tablet; Refill: 0  5. Chronic kidney disease (CKD), stage V (HCC)  - CBC with Differential/Platelet - COMPLETE METABOLIC PANEL WITH GFR - TSH  6. Other specified hypothyroidism  - TSH  7. Primary osteoarthritis of both knees  With antalgic gait, favors left side, refer to PT, she needs to take Tylenol 3 times daily

## 2016-06-15 LAB — CULTURE, URINE COMPREHENSIVE: ORGANISM ID, BACTERIA: NO GROWTH

## 2016-06-20 ENCOUNTER — Other Ambulatory Visit: Payer: Self-pay | Admitting: Neurology

## 2016-06-20 DIAGNOSIS — R296 Repeated falls: Secondary | ICD-10-CM | POA: Diagnosis not present

## 2016-06-20 DIAGNOSIS — R4781 Slurred speech: Secondary | ICD-10-CM

## 2016-06-20 DIAGNOSIS — R410 Disorientation, unspecified: Secondary | ICD-10-CM | POA: Diagnosis not present

## 2016-06-20 DIAGNOSIS — R29898 Other symptoms and signs involving the musculoskeletal system: Secondary | ICD-10-CM | POA: Diagnosis not present

## 2016-06-20 DIAGNOSIS — R471 Dysarthria and anarthria: Secondary | ICD-10-CM

## 2016-06-21 ENCOUNTER — Emergency Department: Payer: Medicare Other

## 2016-06-21 ENCOUNTER — Observation Stay
Admission: EM | Admit: 2016-06-21 | Discharge: 2016-06-23 | Disposition: A | Discharge status: Home / Self Care | Payer: Medicare Other | Attending: Internal Medicine | Admitting: Internal Medicine

## 2016-06-21 DIAGNOSIS — N185 Chronic kidney disease, stage 5: Secondary | ICD-10-CM | POA: Insufficient documentation

## 2016-06-21 DIAGNOSIS — G8191 Hemiplegia, unspecified affecting right dominant side: Secondary | ICD-10-CM | POA: Diagnosis not present

## 2016-06-21 DIAGNOSIS — I2510 Atherosclerotic heart disease of native coronary artery without angina pectoris: Secondary | ICD-10-CM | POA: Diagnosis not present

## 2016-06-21 DIAGNOSIS — N189 Chronic kidney disease, unspecified: Secondary | ICD-10-CM

## 2016-06-21 DIAGNOSIS — I959 Hypotension, unspecified: Secondary | ICD-10-CM | POA: Insufficient documentation

## 2016-06-21 DIAGNOSIS — Z823 Family history of stroke: Secondary | ICD-10-CM | POA: Insufficient documentation

## 2016-06-21 DIAGNOSIS — Z7982 Long term (current) use of aspirin: Secondary | ICD-10-CM | POA: Diagnosis not present

## 2016-06-21 DIAGNOSIS — R4781 Slurred speech: Secondary | ICD-10-CM | POA: Diagnosis not present

## 2016-06-21 DIAGNOSIS — I130 Hypertensive heart and chronic kidney disease with heart failure and stage 1 through stage 4 chronic kidney disease, or unspecified chronic kidney disease: Secondary | ICD-10-CM | POA: Insufficient documentation

## 2016-06-21 DIAGNOSIS — M109 Gout, unspecified: Secondary | ICD-10-CM | POA: Insufficient documentation

## 2016-06-21 DIAGNOSIS — I10 Essential (primary) hypertension: Secondary | ICD-10-CM | POA: Diagnosis not present

## 2016-06-21 DIAGNOSIS — I69922 Dysarthria following unspecified cerebrovascular disease: Secondary | ICD-10-CM

## 2016-06-21 DIAGNOSIS — R410 Disorientation, unspecified: Secondary | ICD-10-CM | POA: Insufficient documentation

## 2016-06-21 DIAGNOSIS — I639 Cerebral infarction, unspecified: Secondary | ICD-10-CM | POA: Diagnosis not present

## 2016-06-21 DIAGNOSIS — K219 Gastro-esophageal reflux disease without esophagitis: Secondary | ICD-10-CM | POA: Insufficient documentation

## 2016-06-21 DIAGNOSIS — D649 Anemia, unspecified: Secondary | ICD-10-CM | POA: Diagnosis not present

## 2016-06-21 DIAGNOSIS — I252 Old myocardial infarction: Secondary | ICD-10-CM | POA: Diagnosis not present

## 2016-06-21 DIAGNOSIS — E785 Hyperlipidemia, unspecified: Secondary | ICD-10-CM | POA: Diagnosis not present

## 2016-06-21 DIAGNOSIS — R1311 Dysphagia, oral phase: Secondary | ICD-10-CM

## 2016-06-21 DIAGNOSIS — Z79899 Other long term (current) drug therapy: Secondary | ICD-10-CM | POA: Diagnosis not present

## 2016-06-21 DIAGNOSIS — N179 Acute kidney failure, unspecified: Secondary | ICD-10-CM | POA: Insufficient documentation

## 2016-06-21 DIAGNOSIS — D638 Anemia in other chronic diseases classified elsewhere: Secondary | ICD-10-CM

## 2016-06-21 DIAGNOSIS — E039 Hypothyroidism, unspecified: Secondary | ICD-10-CM | POA: Insufficient documentation

## 2016-06-21 DIAGNOSIS — G9341 Metabolic encephalopathy: Secondary | ICD-10-CM | POA: Insufficient documentation

## 2016-06-21 DIAGNOSIS — I69351 Hemiplegia and hemiparesis following cerebral infarction affecting right dominant side: Secondary | ICD-10-CM

## 2016-06-21 DIAGNOSIS — I509 Heart failure, unspecified: Secondary | ICD-10-CM | POA: Diagnosis not present

## 2016-06-21 DIAGNOSIS — F17290 Nicotine dependence, other tobacco product, uncomplicated: Secondary | ICD-10-CM | POA: Insufficient documentation

## 2016-06-21 DIAGNOSIS — R4701 Aphasia: Secondary | ICD-10-CM | POA: Diagnosis present

## 2016-06-21 DIAGNOSIS — I69322 Dysarthria following cerebral infarction: Secondary | ICD-10-CM

## 2016-06-21 DIAGNOSIS — Z1389 Encounter for screening for other disorder: Secondary | ICD-10-CM

## 2016-06-21 HISTORY — DX: Other forms of angina pectoris: I20.89

## 2016-06-21 HISTORY — DX: Acute myocardial infarction, unspecified: I21.9

## 2016-06-21 HISTORY — DX: Other forms of angina pectoris: I20.8

## 2016-06-21 HISTORY — DX: Pure hypercholesterolemia, unspecified: E78.00

## 2016-06-21 HISTORY — DX: Cerebral infarction, unspecified: I63.9

## 2016-06-21 LAB — DIFFERENTIAL
BASOS ABS: 0.1 10*3/uL (ref 0–0.1)
Basophils Relative: 1 %
EOS ABS: 0.2 10*3/uL (ref 0–0.7)
EOS PCT: 3 %
LYMPHS ABS: 2.4 10*3/uL (ref 1.0–3.6)
Lymphocytes Relative: 30 %
MONOS PCT: 7 %
Monocytes Absolute: 0.6 10*3/uL (ref 0.2–0.9)
Neutro Abs: 4.7 10*3/uL (ref 1.4–6.5)
Neutrophils Relative %: 59 %

## 2016-06-21 LAB — PROTIME-INR
INR: 0.96
Prothrombin Time: 12.8 seconds (ref 11.4–15.2)

## 2016-06-21 LAB — CBC
HEMATOCRIT: 29.3 % — AB (ref 35.0–47.0)
HEMOGLOBIN: 9.3 g/dL — AB (ref 12.0–16.0)
MCH: 25.7 pg — AB (ref 26.0–34.0)
MCHC: 31.8 g/dL — AB (ref 32.0–36.0)
MCV: 81 fL (ref 80.0–100.0)
Platelets: 268 10*3/uL (ref 150–440)
RBC: 3.62 MIL/uL — ABNORMAL LOW (ref 3.80–5.20)
RDW: 18.4 % — ABNORMAL HIGH (ref 11.5–14.5)
WBC: 8 10*3/uL (ref 3.6–11.0)

## 2016-06-21 LAB — COMPREHENSIVE METABOLIC PANEL
ALK PHOS: 79 U/L (ref 38–126)
ALT: 7 U/L — ABNORMAL LOW (ref 14–54)
AST: 18 U/L (ref 15–41)
Albumin: 3.7 g/dL (ref 3.5–5.0)
Anion gap: 9 (ref 5–15)
BILIRUBIN TOTAL: 0.3 mg/dL (ref 0.3–1.2)
BUN: 41 mg/dL — AB (ref 6–20)
CALCIUM: 8.7 mg/dL — AB (ref 8.9–10.3)
CO2: 22 mmol/L (ref 22–32)
Chloride: 109 mmol/L (ref 101–111)
Creatinine, Ser: 3.35 mg/dL — ABNORMAL HIGH (ref 0.44–1.00)
GFR calc Af Amer: 13 mL/min — ABNORMAL LOW (ref >60)
GFR, EST NON AFRICAN AMERICAN: 11 mL/min — AB (ref >60)
Glucose, Bld: 133 mg/dL — ABNORMAL HIGH (ref 65–99)
POTASSIUM: 4.1 mmol/L (ref 3.5–5.1)
Sodium: 140 mmol/L (ref 135–145)
TOTAL PROTEIN: 7.4 g/dL (ref 6.5–8.1)

## 2016-06-21 LAB — APTT: aPTT: 33 seconds (ref 24–36)

## 2016-06-21 LAB — GLUCOSE, CAPILLARY: Glucose-Capillary: 139 mg/dL — ABNORMAL HIGH (ref 65–99)

## 2016-06-21 LAB — TROPONIN I

## 2016-06-21 NOTE — ED Triage Notes (Addendum)
Pt arrives with family, family states that patient is having intermittent slurred speech X 2 days. Pt has MRI scheduled for next week. Pt alert to self, place. Pt unable to recall what year or who president is. Speech is thick. Pt c/o right hand numbness. Pt smile, slightly crooked with right not moving as much as left.

## 2016-06-21 NOTE — H&P (Signed)
[place] @ [facility] Admission History and Physical [employer], D.O.  ---------------------------------------------------------------------------------------------------------------------   PATIENT NAME: Crystal Faulkner MR#: [id number] DATE OF BIRTH: Aug 10, 1925 DATE OF ADMISSION: 06/21/2016 PRIMARY CARE PHYSICIAN: Loistine Chance, MD  REQUESTING/REFERRING PHYSICIAN: ED Dr. Corky Downs  CHIEF COMPLAINT: Chief Complaint  Patient presents with  . Aphasia    HISTORY OF PRESENT ILLNESS: Crystal Faulkner is a 82 y.o. female with a known history of hypertension, hyperlipidemia, CVA and hypothyroidism was in a usual state of health until 2 days ago when she developed left-sided facial droop associated with slurred speech. Patient's daughter states that she was seen by neurologist the day prior to her symptoms starting and was scheduled to have an MRI, echo and carotids neck week. Her aspirin was increased to 325 mg daily by neurology. However the last 48 hours her slurred speech has worsened. Her granddaughter states she could not understand what she was saying this morning. The patient herself complains only of right hand numbness but denies any weakness or other paresthesias. Patient's family states that her memory is gradually declining but she is otherwise functionally independent.  Otherwise there has been no change in status. Patient has been taking medication as prescribed and there has been no recent change in medication or diet.  There has been no recent illness, travel or sick contacts.    Patient denies fevers/chills, weakness, dizziness, chest pain, shortness of breath, N/V/C/D, abdominal pain, dysuria/frequency, changes in mental status.    PAST MEDICAL HISTORY: Past Medical History:  Diagnosis Date  . Chronic kidney disease   . Chronic stable angina ([place])   . Congestive cardiac failure ([place])   . Hypercholesteremia   . Hypertension   . Stroke ([place])   . Thyroid  disease       PAST SURGICAL HISTORY: History reviewed. No pertinent surgical history.    SOCIAL HISTORY: Social History  Substance Use Topics  . Smoking status: Never Smoker  . Smokeless tobacco: Never Used  . Alcohol use No      FAMILY HISTORY: No family history on file.   MEDICATIONS AT HOME: Prior to Admission medications   Not on File      DRUG ALLERGIES: No Known Allergies   REVIEW OF SYSTEMS: CONSTITUTIONAL: No fever/chills, fatigue, weakness, weight gain/loss, headache EYES: No blurry or double vision. ENT: No tinnitus, postnasal drip, redness or soreness of the oropharynx. RESPIRATORY: No cough, wheeze, hemoptysis, dyspnea. CARDIOVASCULAR: No chest pain, orthopnea, palpitations, syncope. GASTROINTESTINAL: No nausea, vomiting, constipation, diarrhea, abdominal pain, hematemesis, melena or hematochezia. GENITOURINARY: No dysuria or hematuria. ENDOCRINE: No polyuria or nocturia. No heat or cold intolerance. HEMATOLOGY: No anemia, bruising, bleeding. INTEGUMENTARY: No rashes, ulcers, lesions. MUSCULOSKELETAL: No arthritis, swelling, gout. NEUROLOGIC: No focal weakness or ataxia. No seizure-type activity. Positive right hand numbness PSYCHIATRIC: No anxiety, depression, insomnia.  PHYSICAL EXAMINATION: VITAL SIGNS: Blood pressure 108/78, pulse 68, temperature 98.1 F (36.7 C), temperature source Oral, resp. rate 15, SpO2 99 %.  GENERAL: 82 y.o.-year-old white female patient, well-developed, well-nourished lying in the bed in no acute distress.  Pleasant and cooperative.   HEENT: Head atraumatic, normocephalic. Pupils equal, round, reactive to light and accommodation. No scleral icterus. Extraocular muscles intact. Nares are patent. Oropharynx is clear. Mucus membranes moist. Edentulous NECK: Supple, full range of motion. No JVD, no bruit heard. No thyroid enlargement, no tenderness, no cervical lymphadenopathy. CHEST: Normal breath sounds bilaterally. No  wheezing, rales, rhonchi or crackles. No use of accessory muscles of respiration.  No  reproducible chest wall tenderness.  CARDIOVASCULAR: S1, S2 normal. No murmurs, rubs, or gallops. Cap refill <2 seconds. ABDOMEN: Soft, nontender, nondistended. No rebound, guarding, rigidity. Normoactive bowel sounds present in all four quadrants. No organomegaly or mass. EXTREMITIES: Full range of motion. No pedal edema, cyanosis, or clubbing. NEUROLOGIC: Left-sided facial droop with asymmetric smile and slurred speech. Muscle strength 5 out of 54 extremities however sensation in the right upper extremity is diminished. PSYCHIATRIC: The patient is alert and oriented x 3. Normal affect, mood, thought content. SKIN: Warm, dry, and intact without obvious rash, lesion, or ulcer.  LABORATORY PANEL:  CBC  Recent Labs Lab 06/21/16 2224  WBC 8.0  HGB 9.3*  HCT 29.3*  PLT 268   ----------------------------------------------------------------------------------------------------------------- Chemistries  Recent Labs Lab 06/21/16 2224  NA 140  K 4.1  CL 109  CO2 22  GLUCOSE 133*  BUN 41*  CREATININE 3.35*  CALCIUM 8.7*  AST 18  ALT 7*  ALKPHOS 79  BILITOT 0.3   ------------------------------------------------------------------------------------------------------------------ Cardiac Enzymes  Recent Labs Lab 06/21/16 2224  TROPONINI <0.03   ------------------------------------------------------------------------------------------------------------------  RADIOLOGY: Ct Head Wo Contrast  Result Date: 06/21/2016 CLINICAL DATA:  Worsened slurred speech 3 days ago. EXAM: CT HEAD WITHOUT CONTRAST TECHNIQUE: Contiguous axial images were obtained from the base of the skull through the vertex without intravenous contrast. COMPARISON:  None. FINDINGS: Brain: No large vessel acute territorial infarction. No hemorrhage. No focal mass, mass effect or midline shift. Moderate-to-marked global atrophy.  Ventricles are slightly enlarged but this is felt secondary to atrophy. Fairly extensive and symmetrical periventricular and subcortical white matter hypodensity consistent with small vessel disease. Multiple old appearing bilateral basal ganglia and sub insular lacunar infarcts. Vascular: Vascular calcifications in the carotid arteries at the skullbase. Vertebrobasilar calcifications. No hyperdense vessels. Skull: Heterogenous calvarial and skullbase attenuation likely due to osteopenia. No skull fracture. Mastoid air cells clear. Sinuses/Orbits: Paranasal sinuses are clear. No acute orbital abnormality. Other: None IMPRESSION: 1. No definite CT evidence for acute intracranial abnormality. 2. Extensive periventricular and subcortical white matter hypodensity, presumed small vessel disease. Multiple old appearing bilateral basal ganglial and sub insular lacunar infarcts. 3. Moderate to marked global atrophy Electronically Signed   By: Donavan Foil M.D.   On: 06/21/2016 23:12    EKG:  Sinus rhythm at 77 bpm with normal axis and nonspecific ST and T wave changes  IMPRESSION AND PLAN:  This is a 82 y.o. female with a history of Hypertension, hyperlipidemia, CVA and hypothyroidism  now being admitted with:  1. CVA -  - Admit telemetry observation for neuro workup including: - Studies: MRA/MRI, Echo, Carotids - Labs: CBC, BMP, Lipids, TFTs, A1C - Nursing: Neurochecks, O2, dysphagia screen, continue home meds  - Consults: Neurology, PT/OT, S/S consults.  - Meds: Daily aspirin 325mg .   - Fluids: IVNS@75cc /hr.   - Routine DVT Px: with Lovenox, SCDs, early ambulation  2. AKI on CK D-gentle IV fluid hydration and recheck BMP in a.m. 3. Anemia, chronic-follow CBC. Continue iron 4. History of hypertension-continue Norvasc, metoprolol, Imdur, Lasix 5. History of hypothyroidism-continue Synthroid 6. History of hyperlipidemia-continue Crestor 7. History of gout-continue allopurinol 8. History of  GERD-Protonix in place of Dexilant   Code Status: Full  All the records are reviewed and case discussed with ED provider. Management plans discussed with the patient and/or family who express understanding and agree with plan of care.   TOTAL TIME TAKING CARE OF THIS PATIENT: 60 minutes.   Caymen Dubray D.O. on 06/21/2016 at 11:32  PM Between 7am to 6pm - Pager - [phone] After 6pm go to [email or web address] - password [email or web address] [facility] [place] [facility] Office [phone] CC: Primary care physician; Loistine Chance, MD     Note: This dictation was prepared with Dragon dictation along with smaller phrase technology. Any transcriptional errors that result from this process are unintentional.

## 2016-06-21 NOTE — ED Notes (Signed)
Admitting MD at bedside.

## 2016-06-21 NOTE — ED Notes (Signed)
Initial slurred speech and facial droop began yesterday. Slurred speech intermittenlty.

## 2016-06-21 NOTE — ED Notes (Signed)
Patient transported to CT 

## 2016-06-21 NOTE — ED Provider Notes (Signed)
[facility] Emergency Department Provider Note   ____________________________________________    I have reviewed the triage vital signs and the nursing notes.   HISTORY  Chief Complaint Aphasia     HPI Crystal Faulkner is a 82 y.o. female who presents with slurred speech. Family members report the patient has had a left-sided facial droop for approximately 48 hours and frequent slurred speech since then. They did see a neurologist and had an MRI scheduled for next week but they feel this is too long. No extremity weakness noted. Patient has complained of numbness of the right hand. Family reports the patient is having difficulty with word finding as well and memory   Past Medical History:  Diagnosis Date  . Hypercholesteremia   . Hypertension   . Stroke ([place])   . Thyroid disease     There are no active problems to display for this patient.   History reviewed. No pertinent surgical history.  Prior to Admission medications   Not on File     Allergies Review of patient's allergies indicates no known allergies.  No family history on file.  Social History Social History  Substance Use Topics  . Smoking status: Never Smoker  . Smokeless tobacco: Never Used  . Alcohol use No    Review of Systems  Constitutional: No fever/chillsReported Eyes: No visual changes.   Cardiovascular: Denies chest pain. Respiratory: Denies shortness of breath. Gastrointestinal: No abdominal pain.  No nausea, no vomiting.     Skin: Negative for rash. Neurological: Negative for headaches   10-point ROS otherwise negative.  ____________________________________________   PHYSICAL EXAM:  VITAL SIGNS: ED Triage Vitals  Enc Vitals Group     BP 06/21/16 2210 (!) 143/80     Pulse Rate 06/21/16 2219 76     Resp 06/21/16 2210 18     Temp 06/21/16 2219 98.1 F (36.7 C)     Temp Source 06/21/16 2219 Oral     SpO2 06/21/16 2219 99 %     Weight --    Height --      Head Circumference --      Peak Flow --      Pain Score --      Pain Loc --      Pain Edu? --      Excl. in [place]? --     Constitutional: Alert And pleasant. Oriented to person and location Eyes: Conjunctivae are normal. PERRLA, EOMI Head: Atraumatic. Nose: No congestion/rhinnorhea. Mouth/Throat: Mucous membranes are moist.   Neck:  Painless ROM, no vertebral tenderness to palpation Cardiovascular: Normal rate, regular rhythm. Grossly normal heart sounds.  Good peripheral circulation. Respiratory: Normal respiratory effort.  No retractions. Lungs CTAB. Gastrointestinal: Soft and nontender. No distention.  No CVA tenderness. Genitourinary: deferred Musculoskeletal:   Warm and well perfused Neurologic:  Slurred speech.. Left-sided facial droop, strength is normal in all extremities Skin:  Skin is warm, dry and intact. No rash noted. Psychiatric: Mood and affect are normal.   ____________________________________________   LABS (all labs ordered are listed, but only abnormal results are displayed)  Labs Reviewed  GLUCOSE, CAPILLARY - Abnormal; Notable for the following:       Result Value   Glucose-Capillary 139 (*)    All other components within normal limits  PROTIME-INR  APTT  CBC  DIFFERENTIAL  COMPREHENSIVE METABOLIC PANEL  TROPONIN I  CBG MONITORING, ED   ____________________________________________  EKG  ED ECG REPORT I, Lavonia Drafts, the attending physician,  personally viewed and interpreted this ECG.  Date: 06/21/2016 EKG Time: 10:11 PM Rate: 77 Rhythm: normal sinus rhythm QRS Axis: normal Intervals: normal ST/T Wave abnormalities: normal Conduction Disturbances: none Narrative Interpretation: unremarkable  ____________________________________________  RADIOLOGY  CT headNo acute stroke ____________________________________________   PROCEDURES  Procedure(s) performed: No    Critical Care performed:  No ____________________________________________   INITIAL IMPRESSION / ASSESSMENT AND PLAN / ED COURSE  Pertinent labs & imaging results that were available during my care of the patient were reviewed by me and considered in my medical decision making (see chart for details).  Long conversation with family regarding onset of symptoms. They agree that they feel the likely stroke occurred at least 48 hours ago and slurred speech has been since then. Patient also reportedly had a fall last week  Clinical Course  Patient took aspirin 325 today   ____________________________________________   FINAL CLINICAL IMPRESSION(S) / ED DIAGNOSES  Final diagnoses:  Cerebrovascular accident (CVA), unspecified mechanism ([place])      NEW MEDICATIONS STARTED DURING THIS VISIT:  New Prescriptions   No medications on file     Note:  This document was prepared using Dragon voice recognition software and may include unintentional dictation errors.    Lavonia Drafts, MD 06/21/16 [phone]

## 2016-06-21 NOTE — ED Notes (Signed)
MD at bedside. 

## 2016-06-22 ENCOUNTER — Encounter: Payer: Self-pay | Admitting: *Deleted

## 2016-06-22 ENCOUNTER — Observation Stay: Payer: Medicare Other

## 2016-06-22 ENCOUNTER — Observation Stay
Admit: 2016-06-22 | Discharge: 2016-06-22 | Disposition: A | Discharge status: Home / Self Care | Payer: Medicare Other | Attending: Family Medicine | Admitting: Family Medicine

## 2016-06-22 DIAGNOSIS — I639 Cerebral infarction, unspecified: Secondary | ICD-10-CM | POA: Diagnosis not present

## 2016-06-22 DIAGNOSIS — N19 Unspecified kidney failure: Secondary | ICD-10-CM | POA: Diagnosis not present

## 2016-06-22 DIAGNOSIS — N179 Acute kidney failure, unspecified: Secondary | ICD-10-CM | POA: Diagnosis not present

## 2016-06-22 DIAGNOSIS — I10 Essential (primary) hypertension: Secondary | ICD-10-CM | POA: Diagnosis not present

## 2016-06-22 DIAGNOSIS — D649 Anemia, unspecified: Secondary | ICD-10-CM | POA: Diagnosis not present

## 2016-06-22 DIAGNOSIS — G459 Transient cerebral ischemic attack, unspecified: Secondary | ICD-10-CM | POA: Diagnosis not present

## 2016-06-22 DIAGNOSIS — I63233 Cerebral infarction due to unspecified occlusion or stenosis of bilateral carotid arteries: Secondary | ICD-10-CM | POA: Diagnosis not present

## 2016-06-22 DIAGNOSIS — R918 Other nonspecific abnormal finding of lung field: Secondary | ICD-10-CM | POA: Diagnosis not present

## 2016-06-22 LAB — TROPONIN I: Troponin I: <0.03 ng/mL (ref <0.03)

## 2016-06-22 LAB — URINALYSIS COMPLETE WITH MICROSCOPIC (ARMC ONLY)
Bilirubin Urine: NEGATIVE
Glucose, UA: NEGATIVE mg/dL
Hgb urine dipstick: NEGATIVE
Ketones, ur: NEGATIVE mg/dL
LEUKOCYTES UA: NEGATIVE
Nitrite: NEGATIVE
PH: 6 (ref 5.0–8.0)
PROTEIN: 30 mg/dL — AB
RBC / HPF: NONE SEEN RBC/hpf (ref 0–5)
Specific Gravity, Urine: 1.009 (ref 1.005–1.030)

## 2016-06-22 LAB — URINE DRUG SCREEN, QUALITATIVE (ARMC ONLY)
Amphetamines, Ur Screen: NOT DETECTED
BARBITURATES, UR SCREEN: NOT DETECTED
Benzodiazepine, Ur Scrn: NOT DETECTED
CANNABINOID 50 NG, UR ~~LOC~~: NOT DETECTED
COCAINE METABOLITE, UR ~~LOC~~: NOT DETECTED
MDMA (ECSTASY) UR SCREEN: NOT DETECTED
Methadone Scn, Ur: NOT DETECTED
Opiate, Ur Screen: NOT DETECTED
PHENCYCLIDINE (PCP) UR S: NOT DETECTED
TRICYCLIC, UR SCREEN: NOT DETECTED

## 2016-06-22 LAB — LIPID PANEL
CHOLESTEROL: 114 mg/dL (ref 0–200)
HDL: 45 mg/dL (ref >40)
LDL CALC: 44 mg/dL (ref 0–99)
TRIGLYCERIDES: 124 mg/dL (ref <150)
Total CHOL/HDL Ratio: 2.5 RATIO
VLDL: 25 mg/dL (ref 0–40)

## 2016-06-22 LAB — ECHOCARDIOGRAM COMPLETE
HEIGHTINCHES: 62 in
Weight: 2000 oz

## 2016-06-22 MED ORDER — ACETAMINOPHEN 650 MG RE SUPP: 650.0000 mg | RECTAL | Status: DC | PRN

## 2016-06-22 MED ORDER — FERROUS SULFATE 325 (65 FE) MG PO TABS
325.0000 mg | ORAL_TABLET | Freq: Two times a day (BID) | ORAL | Status: DC
  Administered 2016-06-22 – 2016-06-23 (×2): 325 mg via ORAL
  Filled 2016-06-22 (×3): qty 1

## 2016-06-22 MED ORDER — AMLODIPINE BESYLATE 5 MG PO TABS: 5.0000 mg | ORAL_TABLET | Freq: Every day | ORAL | Status: DC

## 2016-06-22 MED ORDER — METOPROLOL TARTRATE 50 MG PO TABS: 50.0000 mg | ORAL_TABLET | Freq: Two times a day (BID) | ORAL | Status: DC

## 2016-06-22 MED ORDER — STROKE: EARLY STAGES OF RECOVERY BOOK
Freq: Once | Status: AC
  Administered 2016-06-22: 02:00:00

## 2016-06-22 MED ORDER — ROSUVASTATIN CALCIUM 5 MG PO TABS
10.0000 mg | ORAL_TABLET | Freq: Every day | ORAL | Status: DC
  Administered 2016-06-22 – 2016-06-23 (×2): 10 mg via ORAL
  Filled 2016-06-22 (×3): qty 2

## 2016-06-22 MED ORDER — SODIUM BICARBONATE 650 MG PO TABS
1300.0000 mg | ORAL_TABLET | Freq: Two times a day (BID) | ORAL | Status: DC
  Administered 2016-06-22 – 2016-06-23 (×3): 1300 mg via ORAL
  Filled 2016-06-22 (×7): qty 2

## 2016-06-22 MED ORDER — ENOXAPARIN SODIUM 30 MG/0.3ML ~~LOC~~ SOLN: 30.0000 mg | SUBCUTANEOUS | Status: DC

## 2016-06-22 MED ORDER — ISOSORBIDE MONONITRATE ER 30 MG PO TB24
60.0000 mg | ORAL_TABLET | Freq: Every day | ORAL | Status: DC
  Administered 2016-06-22 – 2016-06-23 (×2): 60 mg via ORAL
  Filled 2016-06-22 (×3): qty 2

## 2016-06-22 MED ORDER — ADULT MULTIVITAMIN W/MINERALS CH
1.0000 | ORAL_TABLET | Freq: Every day | ORAL | Status: DC
  Administered 2016-06-22 – 2016-06-23 (×2): 1 via ORAL
  Filled 2016-06-22 (×4): qty 1

## 2016-06-22 MED ORDER — RALOXIFENE HCL 60 MG PO TABS
60.0000 mg | ORAL_TABLET | Freq: Every day | ORAL | Status: DC
  Administered 2016-06-22 – 2016-06-23 (×2): 60 mg via ORAL
  Filled 2016-06-22 (×3): qty 1

## 2016-06-22 MED ORDER — ALLOPURINOL 100 MG PO TABS
100.0000 mg | ORAL_TABLET | Freq: Every day | ORAL | Status: DC
  Administered 2016-06-22 – 2016-06-23 (×2): 100 mg via ORAL
  Filled 2016-06-22 (×3): qty 1

## 2016-06-22 MED ORDER — ASPIRIN EC 81 MG PO TBEC: 81.0000 mg | DELAYED_RELEASE_TABLET | Freq: Every day | ORAL | Status: DC

## 2016-06-22 MED ORDER — ASPIRIN 325 MG PO TABS
325.0000 mg | ORAL_TABLET | Freq: Every day | ORAL | Status: DC
  Administered 2016-06-22 – 2016-06-23 (×2): 325 mg via ORAL
  Filled 2016-06-22 (×3): qty 1

## 2016-06-22 MED ORDER — SODIUM CHLORIDE 0.9 % IV SOLN
INTRAVENOUS | Status: DC
  Administered 2016-06-22 (×2): via INTRAVENOUS

## 2016-06-22 MED ORDER — ACETAMINOPHEN 325 MG PO TABS: 650.0000 mg | ORAL_TABLET | ORAL | Status: DC | PRN

## 2016-06-22 MED ORDER — HEPARIN SODIUM (PORCINE) 5000 UNIT/ML IJ SOLN
5000.0000 [IU] | Freq: Three times a day (TID) | INTRAMUSCULAR | Status: DC
  Administered 2016-06-22 – 2016-06-23 (×4): 5000 [IU] via SUBCUTANEOUS
  Filled 2016-06-22 (×4): qty 1

## 2016-06-22 MED ORDER — LEVOTHYROXINE SODIUM 75 MCG PO TABS
75.0000 ug | ORAL_TABLET | Freq: Every day | ORAL | Status: DC
  Administered 2016-06-22 – 2016-06-23 (×2): 75 ug via ORAL
  Filled 2016-06-22 (×2): qty 1

## 2016-06-22 MED ORDER — FUROSEMIDE 40 MG PO TABS: 40.0000 mg | ORAL_TABLET | Freq: Every day | ORAL | Status: DC

## 2016-06-22 MED ORDER — SENNOSIDES-DOCUSATE SODIUM 8.6-50 MG PO TABS: 1.0000 | ORAL_TABLET | Freq: Every evening | ORAL | Status: DC | PRN

## 2016-06-22 MED ORDER — VITAMIN D 1000 UNITS PO TABS
2000.0000 [IU] | ORAL_TABLET | Freq: Every day | ORAL | Status: DC
  Administered 2016-06-22 – 2016-06-23 (×2): 2000 [IU] via ORAL
  Filled 2016-06-22 (×3): qty 2

## 2016-06-22 MED ORDER — PANTOPRAZOLE SODIUM 40 MG PO TBEC
40.0000 mg | DELAYED_RELEASE_TABLET | Freq: Every day | ORAL | Status: DC
  Administered 2016-06-22 – 2016-06-23 (×2): 40 mg via ORAL
  Filled 2016-06-22 (×3): qty 1

## 2016-06-22 NOTE — Progress Notes (Addendum)
[place] at [place] NAME: Crystal Faulkner    MR#:  [id number]  DATE OF BIRTH:  Feb 05, 1925  SUBJECTIVE:  CHIEF COMPLAINT:   Chief Complaint  Patient presents with  . Aphasia   The patient is a 82 year old female with past medical history significant for history of multiple medical problems including essential hypertension, hyperlipidemia, stroke, hypothyroidism, who presents to the hospital with complaints of right facial droop, slurred speech, expressive aphasia, right hand numbness and weakness. Upon further investigation, it appears that she has weakness all over the right side. She remains poorly coherent, intermittently able to follow commands, does not speak, mumbles for herself. Review of Systems  Unable to perform ROS: Medical condition    VITAL SIGNS: Blood pressure 108/63, pulse 67, temperature 98.7 F (37.1 C), temperature source Oral, resp. rate 16, height 5\' 2"  (1.575 m), weight 56.7 kg (125 lb), SpO2 97 %.  PHYSICAL EXAMINATION:   GENERAL:  82 y.o.-year-old patient lying in the bed with no acute distress. Somnolent, however, opens her eyes and looks around. Right lower facial droop. Tongue deviation to the right EYES: Pupils equal, round, reactive to light and accommodation. No scleral icterus. Extraocular muscles intact.  HEENT: Head atraumatic, normocephalic. Oropharynx and nasopharynx clear.  NECK:  Supple, no jugular venous distention. No thyroid enlargement, no tenderness.  LUNGS: Normal breath sounds bilaterally, no wheezing, rales,rhonchi or crepitation. No use of accessory muscles of respiration.  CARDIOVASCULAR: S1, S2 normal. No murmurs, rubs, or gallops.  ABDOMEN: Soft, nontender, nondistended. Bowel sounds present. No organomegaly or mass.  EXTREMITIES: No pedal edema, cyanosis, or clubbing.  NEUROLOGIC: Cranial nerves II through XII revealed the lower facial weakness on the right. Muscle strength 5/5 in  left-sided extremities, 4 out of 5 in right sided extremities. Sensation unable to evaluate. Gait not checked. Tongue deviation to the right PSYCHIATRIC: The patient is somnolent, opens her eyes, intermittently follows commands, mumbles understandably, not able to assess orientation  SKIN: No obvious rash, lesion, or ulcer.   ORDERS/RESULTS REVIEWED:   CBC  Recent Labs Lab 06/21/16 2224  WBC 8.0  HGB 9.3*  HCT 29.3*  PLT 268  MCV 81.0  MCH 25.7*  MCHC 31.8*  RDW 18.4*  LYMPHSABS 2.4  MONOABS 0.6  EOSABS 0.2  BASOSABS 0.1   ------------------------------------------------------------------------------------------------------------------  Chemistries   Recent Labs Lab 06/21/16 2224  NA 140  K 4.1  CL 109  CO2 22  GLUCOSE 133*  BUN 41*  CREATININE 3.35*  CALCIUM 8.7*  AST 18  ALT 7*  ALKPHOS 79  BILITOT 0.3   ------------------------------------------------------------------------------------------------------------------ estimated creatinine clearance is 9 mL/min (by C-G formula based on SCr of 3.35 mg/dL (H)). ------------------------------------------------------------------------------------------------------------------ No results for input(s): TSH, T4TOTAL, T3FREE, THYROIDAB in the last 72 hours.  Invalid input(s): FREET3  Cardiac Enzymes  Recent Labs Lab 06/21/16 2224 06/22/16 0129 06/22/16 0815  TROPONINI <0.03 <0.03 <0.03   ------------------------------------------------------------------------------------------------------------------ Invalid input(s): POCBNP ---------------------------------------------------------------------------------------------------------------  RADIOLOGY: Ct Head Wo Contrast  Result Date: 06/21/2016 CLINICAL DATA:  Worsened slurred speech 3 days ago. EXAM: CT HEAD WITHOUT CONTRAST TECHNIQUE: Contiguous axial images were obtained from the base of the skull through the vertex without intravenous contrast. COMPARISON:   None. FINDINGS: Brain: No large vessel acute territorial infarction. No hemorrhage. No focal mass, mass effect or midline shift. Moderate-to-marked global atrophy. Ventricles are slightly enlarged but this is felt secondary to atrophy. Fairly extensive and symmetrical periventricular and subcortical white matter hypodensity consistent with small vessel  disease. Multiple old appearing bilateral basal ganglia and sub insular lacunar infarcts. Vascular: Vascular calcifications in the carotid arteries at the skullbase. Vertebrobasilar calcifications. No hyperdense vessels. Skull: Heterogenous calvarial and skullbase attenuation likely due to osteopenia. No skull fracture. Mastoid air cells clear. Sinuses/Orbits: Paranasal sinuses are clear. No acute orbital abnormality. Other: None IMPRESSION: 1. No definite CT evidence for acute intracranial abnormality. 2. Extensive periventricular and subcortical white matter hypodensity, presumed small vessel disease. Multiple old appearing bilateral basal ganglial and sub insular lacunar infarcts. 3. Moderate to marked global atrophy Electronically Signed   By: Donavan Foil M.D.   On: 06/21/2016 23:12    EKG:  Orders placed or performed during the hospital encounter of 06/21/16  . EKG 12-Lead  . EKG 12-Lead  . ED EKG  . ED EKG    ASSESSMENT AND PLAN:  Active Problems:   CVA (cerebral vascular accident) ([place])  #1. Acute stroke with right-sided weakness, slurred speech and expressive aphasia, continue patient on aspirin, Crestor, MRI of the brain is pending as well as echocardiogram and carotid ultrasound, neurology consultation is requested, physical therapist, occupational therapist and speech therapist evaluation are pending, discussed with the patient's daughter. Initiate patient on dysphagia 1 diet with thickened liquids. Head CT was unremarkable, but old lacunar infarcts, small vessel disease #2. Hypotension, continue IV fluids, suspend all blood pressure  medications #3. Acute renal failure, repeat kidney function test today, get renal ultrasound #4 anemia, get Hemoccult #5. Metabolic encephalopathy, frequent neuro checks, supportive therapy, IV fluids, SLP, aspiration precautions  Management plans discussed with the patient, family and they are in agreement.   DRUG ALLERGIES: No Known Allergies  CODE STATUS:     Code Status Orders        Start     Ordered   06/22/16 0126  Full code  Continuous     06/22/16 0125    Code Status History    Date Active Date Inactive Code Status Order ID Comments User Context   This patient has a current code status but no historical code status.      TOTAL TIME TAKING CARE OF THIS PATIENT: 45 minutes.  Discussed with the patient's daughter, all questions were answered, she voiced understanding  Phyillis Dascoli M.D on 06/22/2016 at 11:07 AM  Between 7am to 6pm - Pager - [phone]  After 6pm go to [email or web address] - password [email or web address] [place] [facility]  Office  [phone]  CC: Primary care physician; Loistine Chance, MD

## 2016-06-22 NOTE — Consult Note (Signed)
Referring Physician: Dr. Ether Griffins    Chief Complaint: R sided numbness, R dysarthria  HPI: Crystal Faulkner is an 82 y.o. female female with a known history of hypertension, hyperlipidemia, CVA and hypothyroidism was in a usual state of health until 2 days ago when she developed left-sided facial droop associated with slurred speech. Pt did see a Neurologist on Friday who ordered and increase of ASA 81 to 325 and ordered MRI brain for next Thursday.  Last night pt's speech worsened along with R sided numbness.    CTH shows b/l subcortical chronic infarcts, nothing acute   Date last known well: Unable to determine Time last known well: Unable to determine tPA Given: No: Outside TPA window. Last known well 2-3 days ago  Past Medical History:  Diagnosis Date  . Chronic kidney disease   . Chronic stable angina ([place])   . Hypercholesteremia   . Hypertension   . Myocardial infarction   . Stroke ([place])   . Thyroid disease     History reviewed. No pertinent surgical history.  History reviewed. No pertinent family history. Social History:  reports that she has never smoked. Her smokeless tobacco use includes Snuff. She reports that she does not drink alcohol. Her drug history is not on file.  Allergies: No Known Allergies  Medications: I have reviewed the patient's current medications.  ROS: History obtained from chart review  General ROS: negative for - chills, fatigue, fever, night sweats, weight gain or weight loss Psychological ROS: negative for - behavioral disorder, hallucinations, memory difficulties, mood swings or suicidal ideation Ophthalmic ROS: negative for - blurry vision, double vision, eye pain or loss of vision ENT ROS: negative for - epistaxis, nasal discharge, oral lesions, sore throat, tinnitus or vertigo Allergy and Immunology ROS: negative for - hives or itchy/watery eyes Hematological and Lymphatic ROS: negative for - bleeding problems, bruising or swollen lymph  nodes Endocrine ROS: negative for - galactorrhea, hair pattern changes, polydipsia/polyuria or temperature intolerance Respiratory ROS: negative for - cough, hemoptysis, shortness of breath or wheezing Cardiovascular ROS: negative for - chest pain, dyspnea on exertion, edema or irregular heartbeat Gastrointestinal ROS: negative for - abdominal pain, diarrhea, hematemesis, nausea/vomiting or stool incontinence Genito-Urinary ROS: negative for - dysuria, hematuria, incontinence or urinary frequency/urgency Musculoskeletal ROS: negative for - joint swelling or muscular weakness Neurological ROS: as noted in HPI Dermatological ROS: negative for rash and skin lesion changes  Physical Examination: Blood pressure 108/63, pulse 67, temperature 98.7 F (37.1 C), temperature source Oral, resp. rate 16, height 5\' 2"  (1.575 m), weight 56.7 kg (125 lb), SpO2 97 %.  HEENT-  Normocephalic, no lesions, without obvious abnormality.  Normal external eye and conjunctiva.  Normal TM's bilaterally.  Normal auditory canals and external ears. Normal external nose, mucus membranes and septum.  Normal pharynx. Cardiovascular- regular rate and rhythm, S1, S2 normal, no murmur, click, rub or gallop, pulses palpable throughout   Lungs- Heart exam - S1, S2 normal, no murmur, no gallop, rate regular Abdomen- soft, non-tender; bowel sounds normal; no masses,  no organomegaly Extremities- less then 2 second capillary refill Lymph-no adenopathy palpable Musculoskeletal-no joint tenderness, deformity or swelling Skin-warm and dry, no hyperpigmentation, vitiligo, or suspicious lesions  Neurological Examination Mental Status: Alert, oriented, thought content appropriate.  Speech fluent without evidence of aphasia.  Able to follow 3 step commands without difficulty. Cranial Nerves: II: Discs flat bilaterally; Visual fields grossly normal, pupils equal, round, reactive to light and accommodation III,IV, VI: ptosis not  present, extra-ocular  motions intact bilaterally V,VII: R facial droop VIII: hearing normal bilaterally IX,X: gag reflex present XI: bilateral shoulder shrug XII: midline tongue extension Motor: Right : Upper extremity   4/5    Left:     Upper extremity   4+/5  Lower extremity   4/5     Lower extremity   4+/5 Tone and bulk:normal tone throughout; no atrophy noted Sensory: Decreased to light touch on R side.  Deep Tendon Reflexes: 1+ and symmetric throughout Plantars: Right: downgoing   Left: downgoing Cerebellar: normal finger-to-nose, normal rapid alternating movements and normal heel-to-shin test Gait: not tested      Laboratory Studies:  Basic Metabolic Panel:  Recent Labs Lab 06/21/16 2224  NA 140  K 4.1  CL 109  CO2 22  GLUCOSE 133*  BUN 41*  CREATININE 3.35*  CALCIUM 8.7*    Liver Function Tests:  Recent Labs Lab 06/21/16 2224  AST 18  ALT 7*  ALKPHOS 79  BILITOT 0.3  PROT 7.4  ALBUMIN 3.7   No results for input(s): LIPASE, AMYLASE in the last 168 hours. No results for input(s): AMMONIA in the last 168 hours.  CBC:  Recent Labs Lab 06/21/16 2224  WBC 8.0  NEUTROABS 4.7  HGB 9.3*  HCT 29.3*  MCV 81.0  PLT 268    Cardiac Enzymes:  Recent Labs Lab 06/21/16 2224 06/22/16 0129 06/22/16 0815  TROPONINI <0.03 <0.03 <0.03    BNP: Invalid input(s): POCBNP  CBG:  Recent Labs Lab 06/21/16 2212  GLUCAP 139*    Microbiology: No results found for this or any previous visit.  Coagulation Studies:  Recent Labs  06/21/16 2242  LABPROT 12.8  INR 0.96    Urinalysis:  Recent Labs Lab 06/22/16 0544  COLORURINE STRAW*  LABSPEC 1.009  PHURINE 6.0  GLUCOSEU NEGATIVE  HGBUR NEGATIVE  BILIRUBINUR NEGATIVE  KETONESUR NEGATIVE  PROTEINUR 30*  NITRITE NEGATIVE  LEUKOCYTESUR NEGATIVE    Lipid Panel:    Component Value Date/Time   CHOL 114 06/22/2016 0815   TRIG 124 06/22/2016 0815   HDL 45 06/22/2016 0815   CHOLHDL 2.5  06/22/2016 0815   VLDL 25 06/22/2016 0815   LDLCALC 44 06/22/2016 0815    HgbA1C: No results found for: HGBA1C  Urine Drug Screen:     Component Value Date/Time   LABOPIA NONE DETECTED 06/22/2016 0544   COCAINSCRNUR NONE DETECTED 06/22/2016 0544   LABBENZ NONE DETECTED 06/22/2016 0544   AMPHETMU NONE DETECTED 06/22/2016 0544   THCU NONE DETECTED 06/22/2016 0544   LABBARB NONE DETECTED 06/22/2016 0544    Alcohol Level: No results for input(s): ETH in the last 168 hours.  Other results: EKG: normal EKG, normal sinus rhythm, unchanged from previous tracings.  Imaging: Ct Head Wo Contrast  Result Date: 06/21/2016 CLINICAL DATA:  Worsened slurred speech 3 days ago. EXAM: CT HEAD WITHOUT CONTRAST TECHNIQUE: Contiguous axial images were obtained from the base of the skull through the vertex without intravenous contrast. COMPARISON:  None. FINDINGS: Brain: No large vessel acute territorial infarction. No hemorrhage. No focal mass, mass effect or midline shift. Moderate-to-marked global atrophy. Ventricles are slightly enlarged but this is felt secondary to atrophy. Fairly extensive and symmetrical periventricular and subcortical white matter hypodensity consistent with small vessel disease. Multiple old appearing bilateral basal ganglia and sub insular lacunar infarcts. Vascular: Vascular calcifications in the carotid arteries at the skullbase. Vertebrobasilar calcifications. No hyperdense vessels. Skull: Heterogenous calvarial and skullbase attenuation likely due to osteopenia. No skull fracture. Mastoid  air cells clear. Sinuses/Orbits: Paranasal sinuses are clear. No acute orbital abnormality. Other: None IMPRESSION: 1. No definite CT evidence for acute intracranial abnormality. 2. Extensive periventricular and subcortical white matter hypodensity, presumed small vessel disease. Multiple old appearing bilateral basal ganglial and sub insular lacunar infarcts. 3. Moderate to marked global atrophy  Electronically Signed   By: Donavan Foil M.D.   On: 06/21/2016 23:12    Assessment: 82 y.o. female with a known history of hypertension, hyperlipidemia, CVA and hypothyroidism was in a usual state of health until 2 days ago when she developed left-sided facial droop associated with slurred speech. Pt did see a Neurologist on Friday who ordered and increase of ASA 81 to 325 and ordered MRI brain for next Thursday.  Last night pt's speech worsened along with R sided numbness.    CTH shows b/l subcortical chronic infarcts, nothing acute   Stroke Risk Factors - family history, hyperlipidemia and hypertension  Plan: 1. HgbA1c, fasting lipid panel 2. MRI, MRA  of the brain without contrast 3. PT consult, OT consult, Speech consult 4. Echocardiogram 5. Carotid dopplers 6. Prophylactic therapy-Antiplatelet med: Aspirin - dose 325 7. NPO until RN stroke swallow screen 8. Telemetry monitoring 9. Frequent neuro checks 10. Pt has worsening renal function as well which is being worked by primary team with renal ultrasound and urine studies.     Leotis Pain

## 2016-06-22 NOTE — ED Notes (Signed)
Report to Roswell Miners, RN on 1C.
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
## 2016-06-22 NOTE — Care Management Obs Status (Signed)
[place] NOTIFICATION   Patient Details  Name: Crystal Faulkner MRN: [id number] Date of Birth: Feb 01, 1925   Medicare Observation Status Notification Given:  Yes    Ival Bible, RN 06/22/2016, 4:13 PM

## 2016-06-22 NOTE — Progress Notes (Signed)
PT Cancellation Note  Patient Details Name: Crystal Faulkner MRN: [id number] DOB: 1924-10-03   Cancelled Treatment:    Reason Eval/Treat Not Completed: Patient at procedure or test/unavailable. Patient currently going down to ultrasound for further testing. PT will re-attempt as patient is available and appropriate.   Kerman Passey, PT, DPT    06/22/2016, 11:38 AM

## 2016-06-22 NOTE — Evaluation (Signed)
Physical Therapy Evaluation Patient Details Name: Crystal Faulkner MRN: [id number] DOB: 1925/02/03 Today's Date: 06/22/2016   History of Present Illness  Patient presented to [facility] with worsening slurred speech and L facial droop. Per neurology "CTH shows b/l subcortical chronic infarcts, nothing acute ".   Clinical Impression  Patient admitted for testing after daughters noted slurred speech and L facial droop. Per neurology there does not appear to be any acute process currently, chronic infarcts noted. Patient appears at her baseline physically in this session, able to ambulate household distances with [facility]. She has excellent support system in daughters who provide care 24/7. She denies having pain or any numbness/tingling. She does not show any focal weakness on MMT, ROM appears similar in UEs and LEs. She appears to be at her physical mobility baseline no follow up PT appears indicated after discharge.     Follow Up Recommendations No PT follow up    Equipment Recommendations       Recommendations for Other Services       Precautions / Restrictions Precautions Precautions: Fall Restrictions Weight Bearing Restrictions: No      Mobility  Bed Mobility Overal bed mobility: Modified Independent             General bed mobility comments: No deficits in speed relative to her baseline.   Transfers Overall transfer level: Modified independent Equipment used: Straight cane             General transfer comment: Patient able to transfer sit to stand with SPC, no deficits noted.   Ambulation/Gait Ambulation/Gait assistance: Supervision Ambulation Distance (Feet): 200 Feet Assistive device: Straight cane Gait Pattern/deviations: Step-through pattern   Gait velocity interpretation: at or above normal speed for age/gender General Gait Details: Patient demonstrates mild lateral weight shifting to RLE during gait pattern, though per daughters this is her baseline. Appropriate  gait speed, though asymmetrical step lengths and horizontal placement of feet indicative of falls risk noted.   Stairs            Wheelchair Mobility    Modified Rankin (Stroke Patients Only)       Balance Overall balance assessment: Needs assistance Sitting-balance support: No upper extremity supported Sitting balance-Leahy Scale: Normal     Standing balance support: Single extremity supported Standing balance-Leahy Scale: Good                               Pertinent Vitals/Pain Pain Assessment: No/denies pain    Home Living Family/patient expects to be discharged to:: Private residence Living Arrangements: Children Available Help at Discharge: Family;Available 24 hours/day Type of Home: House Home Access: Stairs to enter Entrance Stairs-Rails:  (Always has assistance) Entrance Stairs-Number of Steps: 2 Home Layout: One level Home Equipment: Cane - single point      Prior Function Level of Independence: Independent with assistive device(s)         Comments: Patient has been able to ambulate short distances in the home. Her daughters are with her 24/7 for assistance needs.      Hand Dominance        Extremity/Trunk Assessment   Upper Extremity Assessment: Overall WFL for tasks assessed           Lower Extremity Assessment: Overall WFL for tasks assessed (No focal deficits identified on MMT)         Communication   Communication: No difficulties (Slurring of speech per daughters relative to baseline)  Cognition Arousal/Alertness: Awake/alert Behavior During Therapy: WFL for tasks assessed/performed Overall Cognitive Status: Within Functional Limits for tasks assessed                      General Comments      Exercises     Assessment/Plan    PT Assessment Patient needs continued PT services  PT Problem List Decreased strength;Decreased mobility;Decreased balance          PT Treatment Interventions DME  instruction;Therapeutic activities;Gait training;Therapeutic exercise;Balance training;Stair training    PT Goals (Current goals can be found in the Care Plan section)  Acute Rehab PT Goals Patient Stated Goal: To return home  PT Goal Formulation: With patient/family Time For Goal Achievement: 07/06/16 Potential to Achieve Goals: Good    Frequency Min 2X/week   Barriers to discharge        Co-evaluation               End of Session Equipment Utilized During Treatment: Gait belt Activity Tolerance: Patient tolerated treatment well Patient left: in bed;with bed alarm set;with call bell/phone within reach;with family/visitor present Nurse Communication: Mobility status    Functional Assessment Tool Used: Clinical judgement, report from daughters  Functional Limitation: Mobility: Walking and moving around Mobility: Walking and Moving Around Current Status [phone]): 0 percent impaired, limited or restricted Mobility: Walking and Moving Around Goal Status [phone]): 0 percent impaired, limited or restricted    Time: 6568-1275 PT Time Calculation (min) (ACUTE ONLY): 9 min   Charges:   PT Evaluation $PT Eval Low Complexity: 1 Procedure     PT G Codes:   PT G-Codes **NOT FOR INPATIENT CLASS** Functional Assessment Tool Used: Clinical judgement, report from daughters  Functional Limitation: Mobility: Walking and moving around Mobility: Walking and Moving Around Current Status [phone]): 0 percent impaired, limited or restricted Mobility: Walking and Moving Around Goal Status ([id number]): 0 percent impaired, limited or restricted   Kerman Passey, PT, DPT    06/22/2016, 4:40 PM

## 2016-06-23 ENCOUNTER — Telehealth: Payer: Self-pay | Admitting: Family Medicine

## 2016-06-23 DIAGNOSIS — G9340 Encephalopathy, unspecified: Secondary | ICD-10-CM | POA: Diagnosis not present

## 2016-06-23 DIAGNOSIS — I69351 Hemiplegia and hemiparesis following cerebral infarction affecting right dominant side: Secondary | ICD-10-CM

## 2016-06-23 DIAGNOSIS — I639 Cerebral infarction, unspecified: Secondary | ICD-10-CM | POA: Diagnosis not present

## 2016-06-23 DIAGNOSIS — N185 Chronic kidney disease, stage 5: Secondary | ICD-10-CM | POA: Diagnosis not present

## 2016-06-23 DIAGNOSIS — I959 Hypotension, unspecified: Secondary | ICD-10-CM

## 2016-06-23 DIAGNOSIS — D638 Anemia in other chronic diseases classified elsewhere: Secondary | ICD-10-CM

## 2016-06-23 DIAGNOSIS — G9341 Metabolic encephalopathy: Secondary | ICD-10-CM

## 2016-06-23 DIAGNOSIS — I69322 Dysarthria following cerebral infarction: Secondary | ICD-10-CM

## 2016-06-23 LAB — BASIC METABOLIC PANEL
ANION GAP: 6 (ref 5–15)
BUN: 35 mg/dL — ABNORMAL HIGH (ref 6–20)
CHLORIDE: 116 mmol/L — AB (ref 101–111)
CO2: 22 mmol/L (ref 22–32)
Calcium: 8.4 mg/dL — ABNORMAL LOW (ref 8.9–10.3)
Creatinine, Ser: 3.26 mg/dL — ABNORMAL HIGH (ref 0.44–1.00)
GFR calc non Af Amer: 12 mL/min — ABNORMAL LOW (ref >60)
GFR, EST AFRICAN AMERICAN: 13 mL/min — AB (ref >60)
Glucose, Bld: 93 mg/dL (ref 65–99)
POTASSIUM: 4 mmol/L (ref 3.5–5.1)
SODIUM: 144 mmol/L (ref 135–145)

## 2016-06-23 LAB — TROPONIN I: Troponin I: <0.03 ng/mL (ref <0.03)

## 2016-06-23 LAB — TSH: TSH: 0.42 u[IU]/mL (ref 0.350–4.500)

## 2016-06-23 LAB — HEMOGLOBIN A1C
Hgb A1c MFr Bld: 5.6 % (ref 4.8–5.6)
Mean Plasma Glucose: 114 mg/dL

## 2016-06-23 LAB — HEMOGLOBIN: HEMOGLOBIN: 7.8 g/dL — AB (ref 12.0–16.0)

## 2016-06-23 MED ORDER — CLOPIDOGREL BISULFATE 75 MG PO TABS: 75.0000 mg | ORAL_TABLET | Freq: Every day | ORAL | Status: DC

## 2016-06-23 MED ORDER — SENNOSIDES-DOCUSATE SODIUM 8.6-50 MG PO TABS: 1.0000 | ORAL_TABLET | Freq: Every evening | ORAL | 5 refills | Status: DC | PRN

## 2016-06-23 MED ORDER — CLOPIDOGREL BISULFATE 75 MG PO TABS: 75.0000 mg | ORAL_TABLET | Freq: Every day | ORAL | 5 refills | Status: DC
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
## 2016-06-23 NOTE — Progress Notes (Signed)
Discussed discharge instructions and medications with pt and her daughters. IV removed. All questions addressed. [facility] for [facility]'s daughter to pick up. Pt transported home via car by her daughter.  Clarise Cruz, RN

## 2016-06-23 NOTE — Care Management (Signed)
Discharge to home today per Dr. Ether Griffins. Outpatient speech therapy recommended. Ms Roberti and family are in agreement. Dr. Ether Griffins signed order, faxed to [facility] therapy department. Family will transport. Shelbie Ammons RN MSN CCM Care Management [phone]

## 2016-06-23 NOTE — Telephone Encounter (Signed)
FYI: Hospital called and made a 3 day hospital follow up for 06/27/16 for CVA

## 2016-06-23 NOTE — Evaluation (Signed)
Speech Language Pathology Evaluation Patient Details Name: Crystal Faulkner MRN: [id number] DOB: [birth year] Today's Date: 06/23/2016 Time: 1000-1100 SLP Time Calculation (min) (ACUTE ONLY): 60 min  Problem List:  Patient Active Problem List   Diagnosis Date Noted  . Slurred speech 06/23/2016  . Right sided weakness 06/23/2016  . Hypotension 06/23/2016  . CKD (chronic kidney disease) stage 5, GFR less than 15 ml/min (HCC) 06/23/2016  . Anemia of chronic disease 06/23/2016  . Encephalopathy, metabolic 40/98/1191  . CVA (cerebral vascular accident) ([place]) 06/21/2016   Past Medical History:  Past Medical History:  Diagnosis Date  . Chronic kidney disease   . Chronic stable angina ([place])   . Hypercholesteremia   . Hypertension   . Myocardial infarction   . Stroke ([place])   . Thyroid disease    Past Surgical History: History reviewed. No pertinent surgical history. HPI:  [name] a known history of hypertension, hyperlipidemia, CVA and hypothyroidismwas in a usual state of health until 2 days ago when she developed left-sided facial droop associated with slurred speech. Patient's daughter states that she was seen by neurologist the day prior to her symptoms starting and was scheduled to have an MRI, echo and carotids neck week. Her aspirin was increased to 325 mg daily by neurology. However the last 48 hours her slurred speech has worsened. Her granddaughter states she could not understand what she was saying this morning. The patient herself complains only of right hand numbness but denies any weakness or other paresthesias. Patient's family states that her memory is gradually declining but she is otherwise functionally independent. CT revealed extensive periventricular and subcortical white matterhypodensity, presumed small vessel disease. Multiple old appearing bilateral basal ganglial and sub insular lacunar infarcts. Moderate to marked global atrophy.     Assessment / Plan / Recommendation Clinical Impression  Speech Language evaluation conducted. Pt is a poor histoiran, pt's family present during evaluation and helped with establishing baseline ability. Pt's cognitive function appears at baseline, pt's family agrees. Pt's expressive language also appears at baseline, family agrees. Clinically pt's speech intelligibility is greatly reduced due to impercise articulation. Pt is aproximately 25% intelligible at word level. To SLP pt is completely unintelligible at the phrase and sentence level. However, Pt's family states that this is baseline and they found her to be intelligible. To her family her speech is at baseline and functional. Therefore no further skilled ST is indicated. Pt and family voiced agreement.     SLP Assessment  Patient does not need any further Speech Lanaguage Pathology Services    Follow Up Recommendations  None    Frequency and Duration min 2x/week         SLP Evaluation Cognition  Overall Cognitive Status: Within Functional Limits for tasks assessed Arousal/Alertness: Awake/alert Orientation Level: Oriented to person;Disoriented to place;Disoriented to time;Disoriented to situation (At baseline) Attention: Sustained Sustained Attention: Appears intact Memory: Impaired (At baseline)       Comprehension  Auditory Comprehension Overall Auditory Comprehension: Appears within functional limits for tasks assessed Visual Recognition/Discrimination Discrimination: Not tested Reading Comprehension Reading Status: Not tested    Expression Expression Primary Mode of Expression: Verbal Verbal Expression Overall Verbal Expression: Impaired at baseline Initiation: No impairment Level of Generative/Spontaneous Verbalization: Sentence Repetition: Impaired Level of Impairment: Word level;Phrase level;Sentence level Naming: No impairment Pragmatics: No impairment Non-Verbal Means of Communication: Not  applicable Written Expression Written Expression: Not tested   Oral / Motor  Oral Motor/Sensory Function Overall Oral Motor/Sensory Function: Within functional  limits Motor Speech Overall Motor Speech: Impaired at baseline Respiration: Within functional limits Phonation: Normal Resonance: Within functional limits Articulation: Impaired Level of Impairment: Word Intelligibility: Intelligibility reduced Word: 25-49% accurate Phrase: 0-24% accurate Sentence: 0-24% accurate Conversation: 0-24% accurate Motor Planning: Witnin functional limits Motor Speech Errors: Not applicable Interfering Components: Premorbid status   GO          Functional Assessment Tool Used: Skilled Clinical observation Functional Limitations: Swallowing Swallow Current Status (N0539): At least 1 percent but less than 20 percent impaired, limited or restricted Swallow Goal Status [phone]): At least 1 percent but less than 20 percent impaired, limited or restricted Swallow Discharge Status [phone]): At least 1 percent but less than 20 percent impaired, limited or restricted         Rivka Safer, B.A. Clinical Graduate Student 06/23/2016, 11:28 AM

## 2016-06-23 NOTE — Evaluation (Signed)
Clinical/Bedside Swallow Evaluation Patient Details  Name: Crystal Faulkner MRN: [id number] Date of Birth: December 10, 1924  Today's Date: 06/23/2016 Time: SLP Start Time (ACUTE ONLY): 2426 SLP Stop Time (ACUTE ONLY): 1000 SLP Time Calculation (min) (ACUTE ONLY): 35 min  Past Medical History:  Past Medical History:  Diagnosis Date  . Chronic kidney disease   . Chronic stable angina ([place])   . Hypercholesteremia   . Hypertension   . Myocardial infarction   . Stroke ([place])   . Thyroid disease    Past Surgical History: History reviewed. No pertinent surgical history. HPI:  [name] a known history of hypertension, hyperlipidemia, CVA and hypothyroidismwas in a usual state of health until 2 days ago when she developed left-sided facial droop associated with slurred speech. Patient's daughter states that she was seen by neurologist the day prior to her symptoms starting and was scheduled to have an MRI, echo and carotids neck week. Her aspirin was increased to 325 mg daily by neurology. However the last 48 hours her slurred speech has worsened. Her granddaughter states she could not understand what she was saying this morning. The patient herself complains only of right hand numbness but denies any weakness or other paresthesias. Patient's family states that her memory is gradually declining but she is otherwise functionally independent. CT revealed extensive periventricular and subcortical white matterhypodensity, presumed small vessel disease. Multiple old appearing bilateral basal ganglial and sub insular lacunar infarcts. Moderate to marked global atrophy.    Assessment / Plan / Recommendation Clinical Impression  Pt presents with mild oral phase dysphagia characterized by mild decreased bolus manipulat of soft solid with mild oral residue which cleared with liquid wash. Pt noted with mild increased confusion and difficulty expressing herself. ST recommends conservative  diet of dyspahgia 2 with thin liquids, medicine whole. Recommend pt follow general aspiration precautions when consuming PO's. Education provied to family and duaghter that she lives with as well as nursing. All voiced agreement.     Aspiration Risk  Mild aspiration risk    Diet Recommendation Dysphagia 2 (Fine chop);Thin liquid   Liquid Administration via: Cup Medication Administration: Whole meds with liquid Supervision: Intermittent supervision to cue for compensatory strategies Compensations: Minimize environmental distractions;Slow rate;Small sips/bites;Follow solids with liquid Postural Changes: Seated upright at 90 degrees    Other  Recommendations Oral Care Recommendations: Oral care BID   Follow up Recommendations  (TBD)      Frequency and Duration min 2x/week  2 weeks       Prognosis Prognosis for Safe Diet Advancement: Good      Swallow Study   General Date of Onset: 06/21/16 HPI: [name] a known history of hypertension, hyperlipidemia, CVA and hypothyroidismwas in a usual state of health until 2 days ago when she developed left-sided facial droop associated with slurred speech. Patient's daughter states that she was seen by neurologist the day prior to her symptoms starting and was scheduled to have an MRI, echo and carotids neck week. Her aspirin was increased to 325 mg daily by neurology. However the last 48 hours her slurred speech has worsened. Her granddaughter states she could not understand what she was saying this morning. The patient herself complains only of right hand numbness but denies any weakness or other paresthesias. Patient's family states that her memory is gradually declining but she is otherwise functionally independent. CT revealed extensive periventricular and subcortical white matterhypodensity, presumed small vessel disease. Multiple old appearing bilateral basal ganglial and sub insular  lacunar infarcts. Moderate to  marked global atrophy.  Type of Study: Bedside Swallow Evaluation Previous Swallow Assessment: N/A Diet Prior to this Study: Dysphagia 1 (puree);Honey-thick liquids Temperature Spikes Noted: No Respiratory Status: Room air History of Recent Intubation: No Behavior/Cognition: Alert;Cooperative;Pleasant mood Oral Cavity Assessment: Within Functional Limits Oral Care Completed by SLP: Recent completion by staff Oral Cavity - Dentition: Edentulous Vision: Functional for self-feeding Self-Feeding Abilities: Able to feed self Patient Positioning: Upright in bed Baseline Vocal Quality: Normal Volitional Cough: Strong Volitional Swallow: Able to elicit    Oral/Motor/Sensory Function Overall Oral Motor/Sensory Function: Within functional limits   Ice Chips Ice chips: Within functional limits Presentation: Spoon;Self Fed   Thin Liquid Thin Liquid: Within functional limits Presentation: Cup;Self Fed;Spoon    Nectar Thick Nectar Thick Liquid: Not tested   Honey Thick Honey Thick Liquid: Not tested   Puree Puree: Within functional limits Presentation: Self Fed;Spoon   Solid   GO   Solid: Within functional limits Presentation: Self Fed;Spoon Other Comments:  (Moisten graham crackers)    Functional Assessment Tool Used: Skilled Clinical observation Functional Limitations: Swallowing Swallow Current Status (F4142): At least 1 percent but less than 20 percent impaired, limited or restricted Swallow Goal Status [phone]): At least 1 percent but less than 20 percent impaired, limited or restricted Swallow Discharge Status [phone]): At least 1 percent but less than 20 percent impaired, limited or restricted  Josanna Hefel B. Rutherford Nail M.S., CCC-SLP Speech-Language Pathologist  Karmin Kasprzak 06/23/2016,10:03 AM

## 2016-06-23 NOTE — Plan of Care (Signed)
Problem: Tissue Perfusion: Goal: Complications of Ischemic Stroke will be minimized (choose ONE based on patient diagnosis) Outcome: Progressing Monitoring for worsening neurological exam
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
## 2016-06-24 ENCOUNTER — Ambulatory Visit: Payer: Medicare Other | Attending: Family Medicine | Admitting: Speech Pathology

## 2016-06-24 ENCOUNTER — Encounter: Payer: Self-pay | Admitting: Family Medicine

## 2016-06-24 DIAGNOSIS — R471 Dysarthria and anarthria: Secondary | ICD-10-CM | POA: Diagnosis not present

## 2016-06-24 DIAGNOSIS — R4701 Aphasia: Secondary | ICD-10-CM | POA: Diagnosis not present

## 2016-06-24 NOTE — Telephone Encounter (Signed)
Please call patient, to see how she is doing

## 2016-06-24 NOTE — Therapy (Incomplete Revision)
[place] MAIN [facility] SERVICES [address] [place], [place], [address] Phone: [phone]   Fax:  [phone]  Speech Language Pathology Evaluation  Patient Details  Name: Crystal Faulkner MRN: [id number] Date of Birth: 05/26/1925 Referring Provider: Dr. Ancil Boozer  Encounter Date: 06/24/2016      End of Session - 06/24/16 1723    Visit Number 1   Number of Visits 25   Date for SLP Re-Evaluation 09/24/16   SLP Start Time 4   SLP Stop Time  1700   SLP Time Calculation (min) 60 min   Activity Tolerance Patient tolerated treatment well      Past Medical History:  Diagnosis Date  . Allergy   . Alzheimer's disease   . Anemia   . Chronic kidney disease   . Chronic stable angina ([place])   . Gout   . H/O: GI bleed   . Hypercholesteremia   . Hyperglycemia   . Hyperlipidemia   . Hypertension   . Incontinence   . Myocardial infarction   . Osteoporosis   . Stroke ([place])   . Thyroid disease   . Vitamin D deficiency     Past Surgical History:  Procedure Laterality Date  . EXPLORATORY LAPAROTOMY    . EYE SURGERY Bilateral    cataract    There were no vitals filed for this visit.          SLP Evaluation [facility] - 06/25/16 1602      SLP Visit Information   SLP Received On 06/24/16   Referring Provider Dr. Ancil Boozer   Onset Date 06/19/2016   Medical Diagnosis CVA     Subjective   Subjective Family and patient are concerned and frustrated by lack of speech   Patient/Family Stated Goal To talk again     General Information   HPI HISTORY: The patient was admitted to [facility] 06/21/2016 secondary facial droop and slurred speech. The patient's family reported to the ED MD that the slurred speech began 48 hours earlier (06/19/2016).  The patient's medical record shows that at a recent ED visit (05/03/2016) the patient was observed to have "normal speech and language.  No gross focal neurological deficits".  A head CT (06/21/2016) showed: "1.  No definite CT evidence for acute intracranial abnormality.  2. Extensive periventricular and subcortical white matter hypodensity, presumed small vessel disease. Multiple old appearing bilateral basal ganglial and sub insular lacunar infarcts.  3. Moderate to marked global atrophy."  The patient cannot have an MRI.     Prior Functional Status   Cognitive/Linguistic Baseline Baseline deficits     Cognition   Overall Cognitive Status Difficult to assess     Auditory Comprehension   Overall Auditory Comprehension Impaired   Yes/No Questions Impaired   Complex Questions 50-74% accurate   Commands Impaired   Two Step Basic Commands 75-100% accurate   Multistep Basic Commands 0-24% accurate   Complex Commands 0-24% accurate     Verbal Expression   Overall Verbal Expression Impaired   Initiation Impaired     Oral Motor/Sensory Function   Overall Oral Motor/Sensory Function Impaired   Labial ROM Reduced right   Labial Symmetry Abnormal symmetry right   Labial Strength Reduced Right   Labial Sensation Reduced Right   Labial Coordination Reduced   Lingual ROM Reduced right   Lingual Symmetry Abnormal symmetry right   Lingual Strength Reduced Right   Lingual Sensation Reduced Right   Lingual Coordination Reduced   Facial ROM  Reduced right     Motor Speech   Overall Motor Speech Impaired   Respiration Impaired   Level of Impairment Word   Phonation Normal   Resonance Within functional limits   Articulation Impaired   Level of Impairment Word   Intelligibility Intelligibility reduced   Word 0-24% accurate   Phrase 0-24% accurate   Sentence 0-24% accurate   Conversation 0-24% accurate   Motor Planning Impaired   Level of Impairment Word   Motor Speech Errors Groping for words   Phonation [facility]                         SLP Education - 06/25/16 1714    Education provided Yes   Education Details Patient given written exercises   Person(s) Educated  Patient;Child(ren)   Methods Explanation;Demonstration;Verbal cues;Handout   Comprehension Verbalized understanding;Returned demonstration;Need further instruction            SLP Long Term Goals - 06/25/16 1716      SLP LONG TERM GOAL #1   Title Pt will improve oral motor strength and coordination by completing oral motor exercises with min SLP cues.   Time 12   Period Weeks   Status New     SLP LONG TERM GOAL #2   Title Pt will improve articulatory precision in words by correct phonetic placement with 80% intelligibility.   Time 12   Period Weeks   Status New     SLP LONG TERM GOAL #3   Title Pt will improve speech intelligibility for phrases by controlling rate of speech, over-articulation, and increased loudness to achieve 80% intelligibility.   Time 12   Period Weeks     SLP LONG TERM GOAL #4   Title Patient will demonstrate comprehension for moderately complex yes/no questions and 2-step commands   Time 12   Period Weeks   Status New     SLP LONG TERM GOAL #5   Title Patient will demonstrate functional verbal expression for basic needs.   Time 12   Period Weeks   Status New          Plan - 06/25/16 1714    Clinical Impression Statement At 5 days post onset of CVA, the patient is presenting with severe dysarthria characterized by minimal/absent lingual movement resulting in no discernable consonant production.  Struggle behavior with oral movement suggest a portion may be due to apraxia.  Screening shows the patient has reduced auditory comprehension.  She does not have sufficient verbal output to determine presence or extent of expressive aphasia. The patient will benefit from skilled speech therapy for restorative and compensatory treatment of motor speech deficits and ongoing assessment for language needs.   Speech Therapy Frequency 2x / week   Duration Other (comment)  12 weeks   Treatment/Interventions Language facilitation;Oral motor exercises;Functional  tasks;SLP instruction and feedback;Patient/family education   Potential to Achieve Goals Good   Potential Considerations Ability to learn/carryover information;Co-morbidities;Cooperation/participation level;Medical prognosis;Previous level of function;Severity of impairments;Family/community support   SLP Home Exercise Plan Patient and family given written exercises focusing on lingual movments   Consulted and Agree with Plan of Care Patient;Family member/caregiver   Family Member Consulted 2 daughters      Patient will benefit from skilled therapeutic intervention in order to improve the following deficits and impairments:   Dysarthria and anarthria  Aphasia      G-Codes - 2016/07/07 1725    Functional Assessment Tool Used clinical judgement, western  Aphasia Battery Screening, motor speech eval   Functional Limitations Motor speech   Motor Speech Current Status [phone]) At least 80 percent but less than 100 percent impaired, limited or restricted   Motor Speech Goal Status ([id number]) At least 20 percent but less than 40 percent impaired, limited or restricted      Problem List Patient Active Problem List   Diagnosis Date Noted  . Slurred speech 06/23/2016  . Right sided weakness 06/23/2016  . Hypotension 06/23/2016  . CKD (chronic kidney disease) stage 5, GFR less than 15 ml/min (HCC) 06/23/2016  . Anemia of chronic disease 06/23/2016  . Encephalopathy, metabolic 44/31/5400  . CVA (cerebral vascular accident) ([place]) 06/21/2016  . Senile purpura ([place]) 05/13/2016  . Hiatal hernia 05/04/2016  . Thoracic aorta atherosclerosis ([place]) 05/04/2016  . Chronic stable angina ([place]) 10/16/2015  . Secondary hyperparathyroidism ([place]) 04/13/2015  . Arteriosclerosis of coronary artery 04/08/2015  . Benign hypertension 04/08/2015  . Chronic kidney disease (CKD), stage V ([place]) 04/08/2015  . Controlled gout 04/08/2015  . Dyslipidemia 04/08/2015  . History of peptic ulcer disease 04/08/2015  . Adult  hypothyroidism 04/08/2015  . Mild cognitive disorder 04/08/2015  . Anemia of chronic disease 04/08/2015  . Osteoarthrosis involving more than one site but not generalized 04/08/2015  . Osteoporosis 04/08/2015  . Pelvic muscle wasting 04/08/2015  . Allergic rhinitis 04/08/2015  . Tobacco use 04/08/2015  . CCF (congestive cardiac failure) ([place]) 03/29/2014  . HLD (hyperlipidemia) 03/29/2014  . H/O gastrointestinal hemorrhage 04/05/2007    Lou Miner 06/25/2016, 5:22 PM  [place] MAIN [facility] SERVICES [address] [place], [place], [address] Phone: [phone]   Fax:  [phone]  Name: Crystal Faulkner MRN: [id number] Date of Birth: 06-02-1925

## 2016-06-24 NOTE — Therapy (Signed)
[place] MAIN [facility] SERVICES [address] [place], [place], [address] Phone: [phone]   Fax:  [phone]  Speech Language Pathology Evaluation  Patient Details  Name: Crystal Faulkner MRN: [id number] Date of Birth: 1924-11-14 No Data Recorded  Encounter Date: 2016-06-30      End of Session - 30-Jun-2016 1723    Visit Number 1   Number of Visits 25   Date for SLP Re-Evaluation 09/24/16   SLP Start Time 80   SLP Stop Time  1700   SLP Time Calculation (min) 60 min   Activity Tolerance Patient tolerated treatment well      Past Medical History:  Diagnosis Date  . Allergy   . Alzheimer's disease   . Anemia   . Chronic kidney disease   . Chronic stable angina ([place])   . Gout   . H/O: GI bleed   . Hypercholesteremia   . Hyperglycemia   . Hyperlipidemia   . Hypertension   . Incontinence   . Myocardial infarction   . Osteoporosis   . Stroke ([place])   . Thyroid disease   . Vitamin D deficiency     Past Surgical History:  Procedure Laterality Date  . EXPLORATORY LAPAROTOMY    . EYE SURGERY Bilateral    cataract    There were no vitals filed for this visit.          SLP Evaluation [facility] - 06-30-2016 0001      SLP Visit Information   SLP Received On 2016-06-30     Prior Functional Status   Cognitive/Linguistic Baseline Baseline deficits     Cognition   Overall Cognitive Status Difficult to assess                             Patient will benefit from skilled therapeutic intervention in order to improve the following deficits and impairments:   Dysarthria and anarthria  Aphasia      G-Codes - 30-Jun-2016 1725    Functional Assessment Tool Used clinical judgement, western Aphasia Battery Screening, motor speech eval   Functional Limitations Motor speech   Motor Speech Current Status [phone]) At least 80 percent but less than 100 percent impaired, limited or restricted   Motor Speech Goal Status  (T2458) At least 20 percent but less than 40 percent impaired, limited or restricted      Problem List Patient Active Problem List   Diagnosis Date Noted  . Slurred speech 06/23/2016  . Right sided weakness 06/23/2016  . Hypotension 06/23/2016  . CKD (chronic kidney disease) stage 5, GFR less than 15 ml/min (HCC) 06/23/2016  . Anemia of chronic disease 06/23/2016  . Encephalopathy, metabolic 09/98/3382  . CVA (cerebral vascular accident) ([place]) 06/21/2016  . Senile purpura ([place]) 05/13/2016  . Hiatal hernia 05/04/2016  . Thoracic aorta atherosclerosis ([place]) 05/04/2016  . Chronic stable angina ([place]) 10/16/2015  . Secondary hyperparathyroidism ([place]) 04/13/2015  . Arteriosclerosis of coronary artery 04/08/2015  . Benign hypertension 04/08/2015  . Chronic kidney disease (CKD), stage V ([place]) 04/08/2015  . Controlled gout 04/08/2015  . Dyslipidemia 04/08/2015  . History of peptic ulcer disease 04/08/2015  . Adult hypothyroidism 04/08/2015  . Mild cognitive disorder 04/08/2015  . Anemia of chronic disease 04/08/2015  . Osteoarthrosis involving more than one site but not generalized 04/08/2015  . Osteoporosis 04/08/2015  . Pelvic muscle wasting 04/08/2015  . Allergic rhinitis 04/08/2015  . Tobacco use 04/08/2015  .  CCF (congestive cardiac failure) ([place]) 03/29/2014  . HLD (hyperlipidemia) 03/29/2014  . H/O gastrointestinal hemorrhage 04/05/2007    Lou Miner 06/24/2016, 5:27 PM  [place] MAIN [facility] SERVICES [address] [place], [place], [address] Phone: [phone]   Fax:  [phone]  Name: Crystal Faulkner MRN: [id number] Date of Birth: 1925-05-08

## 2016-06-25 ENCOUNTER — Ambulatory Visit: Payer: Medicare Other | Admitting: Family Medicine

## 2016-06-25 NOTE — Telephone Encounter (Signed)
Spoke with patient Daughter-Patricia Tilley and she states her mother is feeling much better. The hosptial did change her medication around they put her on Centrum Silver Multi-Vitamin, Plavix, and stool softener and stopped Metoprolol.

## 2016-06-26 ENCOUNTER — Other Ambulatory Visit: Payer: Self-pay | Admitting: Family Medicine

## 2016-06-26 ENCOUNTER — Ambulatory Visit (INDEPENDENT_AMBULATORY_CARE_PROVIDER_SITE_OTHER): Payer: Medicare Other | Admitting: Family Medicine

## 2016-06-26 ENCOUNTER — Encounter: Payer: Self-pay | Admitting: Family Medicine

## 2016-06-26 VITALS — BP 122/68 | HR 117 | Temp 98.4°F | Wt 111.0 lb

## 2016-06-26 DIAGNOSIS — R296 Repeated falls: Secondary | ICD-10-CM

## 2016-06-26 DIAGNOSIS — Z09 Encounter for follow-up examination after completed treatment for conditions other than malignant neoplasm: Secondary | ICD-10-CM | POA: Diagnosis not present

## 2016-06-26 DIAGNOSIS — I69322 Dysarthria following cerebral infarction: Secondary | ICD-10-CM | POA: Diagnosis not present

## 2016-06-26 DIAGNOSIS — I69351 Hemiplegia and hemiparesis following cerebral infarction affecting right dominant side: Secondary | ICD-10-CM

## 2016-06-26 NOTE — Progress Notes (Signed)
Name: Crystal Faulkner   MRN: [id number]    DOB: 01-26-1925   Date:06/26/2016       Progress Note  Subjective  Chief Complaint  Chief Complaint  Patient presents with  . Hospitalization Follow-up    CVA  . Cerebrovascular Accident    HPI  Hospital follow up. Patient present  to the hospital with right-sided numbness, some weakness, dysarthria that started the morning of admission on 06/21/2016. CT of head without contrast revealed no acute intracranial abnormality, but extensive periventricular and subcortical white matter hypodensities, presumed small vessel disease, multiple old-appearing bilateral basal ganglia and some insular lacunar infarcts, moderate global atrophy. Patient was admitted to the hospital for further evaluation and treatment, was consulted by neurologist, who recommended to continue statin and change aspirin to Plavix. Carotid ultrasound was performed and revealed less than 50% stenosis in bilateral internal carotid arteries. Echocardiogram showed normal wall motion, normal ejection fraction more than 60%. No cardiac source of emboli. Patient was discharged home on 06/23/2016 still has some sensation deficit on right side and balance problems with recurrent falls. She has follow up with neurologist. Grand-daughter would like to have home care PT/OT instead of having to transport her to hospital for therapy.    Patient Active Problem List   Diagnosis Date Noted  . Dysarthria due to recent cerebrovascular accident (CVA) 06/23/2016  . Hemiparesis affecting right side as late effect of cerebrovascular accident (CVA) ([place]) 06/23/2016  . Hypotension 06/23/2016  . CKD (chronic kidney disease) stage 5, GFR less than 15 ml/min (HCC) 06/23/2016  . Anemia of chronic disease 06/23/2016  . Encephalopathy, metabolic 34/19/6222  . Intermittent confusion 06/21/2016  . Senile purpura ([place]) 05/13/2016  . Hiatal hernia 05/04/2016  . Thoracic aorta atherosclerosis ([place]) 05/04/2016  .  Chronic stable angina ([place]) 10/16/2015  . Secondary hyperparathyroidism ([place]) 04/13/2015  . Arteriosclerosis of coronary artery 04/08/2015  . Benign hypertension 04/08/2015  . Chronic kidney disease (CKD), stage V ([place]) 04/08/2015  . Controlled gout 04/08/2015  . Dyslipidemia 04/08/2015  . History of peptic ulcer disease 04/08/2015  . Adult hypothyroidism 04/08/2015  . Mild cognitive disorder 04/08/2015  . Anemia of chronic disease 04/08/2015  . Osteoarthrosis involving more than one site but not generalized 04/08/2015  . Osteoporosis 04/08/2015  . Pelvic muscle wasting 04/08/2015  . Allergic rhinitis 04/08/2015  . Tobacco use 04/08/2015  . CCF (congestive cardiac failure) ([place]) 03/29/2014  . HLD (hyperlipidemia) 03/29/2014  . H/O gastrointestinal hemorrhage 04/05/2007    Past Surgical History:  Procedure Laterality Date  . EXPLORATORY LAPAROTOMY    . EYE SURGERY Bilateral    cataract    Family History  Problem Relation Age of Onset  . Diabetes Sister   . Hypertension Sister     Social History   Social History  . Marital status: Widowed    Spouse name: N/A  . Number of children: N/A  . Years of education: N/A   Occupational History  . Not on file.   Social History Main Topics  . Smoking status: Never Smoker  . Smokeless tobacco: Current User    Types: Snuff  . Alcohol use No  . Drug use: No  . Sexual activity: No   Other Topics Concern  . Not on file   Social History Narrative   ** Merged History Encounter **         Current Outpatient Prescriptions:  .  allopurinol (ZYLOPRIM) 100 MG tablet, Take 1 tablet (100 mg total) by mouth daily., Disp:  90 tablet, Rfl: 2 .  clopidogrel (PLAVIX) 75 MG tablet, Take 1 tablet (75 mg total) by mouth daily., Disp: 30 tablet, Rfl: 5 .  furosemide (LASIX) 40 MG tablet, Take 1 tablet (40 mg total) by mouth daily., Disp: 90 tablet, Rfl: 1 .  isosorbide mononitrate (IMDUR) 60 MG 24 hr tablet, Take 1 tablet (60 mg total) by  mouth daily., Disp: 90 tablet, Rfl: 2 .  levothyroxine (SYNTHROID) 75 MCG tablet, Take 1 tablet (75 mcg total) by mouth daily at 6 (six) AM., Disp: 90 tablet, Rfl: 1 .  Multiple Vitamin (MULTI-VITAMINS) TABS, Take by mouth. Reported on 10/16/2015, Disp: , Rfl:  .  raloxifene (EVISTA) 60 MG tablet, TAKE 1 TABLET BY MOUTH EVERY DAY, Disp: 90 tablet, Rfl: 2 .  rosuvastatin (CRESTOR) 10 MG tablet, Take 1 tablet (10 mg total) by mouth daily., Disp: 90 tablet, Rfl: 4 .  senna-docusate (SENOKOT-S) 8.6-50 MG tablet, Take 1 tablet by mouth at bedtime as needed for mild constipation or moderate constipation., Disp: 30 tablet, Rfl: 5 .  sodium bicarbonate 650 MG tablet, TAKE 2 TABLETS (1,300 MG TOTAL) BY MOUTH TWO (2) TIMES A DAY., Disp: , Rfl: 11 .  aspirin 81 MG tablet, Take 1 tablet by mouth daily., Disp: , Rfl:  .  Cholecalciferol (VITAMIN D3) 2000 UNITS capsule, Take 1 capsule by mouth daily., Disp: , Rfl:  .  dexlansoprazole (DEXILANT) 60 MG capsule, Take 1 capsule (60 mg total) by mouth daily., Disp: 90 capsule, Rfl: 3 .  ferrous sulfate 325 (65 FE) MG tablet, Take 325 mg by mouth 2 (two) times daily with a meal., Disp: , Rfl:   No Known Allergies   ROS  Constitutional: Negative for fever or weight change.  Respiratory: Negative for cough and shortness of breath.   Cardiovascular: Negative for chest pain or palpitations.  Gastrointestinal: Negative for abdominal pain, no bowel changes.  Musculoskeletal: Negative for gait problem or joint swelling.  Skin: Negative for rash.  Neurological: Negative for dizziness or headache.  No other specific complaints in a complete review of systems (except as listed in HPI above).  Objective  Vitals:   06/26/16 1154  BP: 122/68  Pulse: (!) 117  Temp: 98.4 F (36.9 C)  SpO2: 96%  Weight: 111 lb (50.3 kg)    Body mass index is 20.3 kg/m.  Physical Exam   Constitutional: Patient appears well-developed and alert.  No distress.  HEENT: head  atraumatic, normocephalic, pupils equal and reactive to light,  neck supple, throat within normal limits Cardiovascular: Normal rate, regular rhythm and normal heart sounds.  No murmur heard. No BLE edema. Pulmonary/Chest: Effort normal and breath sounds normal. No respiratory distress. Abdominal: Soft.  There is no tenderness. Psychiatric: Patient has a normal mood and affect. behavior is normal. Judgment and thought content normal. Muscular Skeletal: crepitus both knees, decrease extension right knee,has a cane, antalgic gait Neurologist: cranial nerves intact, she Romberg positive, falls backwards when eyes closed.  Recent Results (from the past 2160 hour(s))  Comprehensive metabolic panel     Status: Abnormal   Collection Time: 05/03/16  5:17 PM  Result Value Ref Range   Sodium 140 135 - 145 mmol/L   Potassium 3.9 3.5 - 5.1 mmol/L   Chloride 108 101 - 111 mmol/L   CO2 23 22 - 32 mmol/L   Glucose, Bld 139 (H) 65 - 99 mg/dL   BUN 41 (H) 6 - 20 mg/dL   Creatinine, Ser 3.61 (H) 0.44 - 1.00  mg/dL   Calcium 8.6 (L) 8.9 - 10.3 mg/dL   Total Protein 7.5 6.5 - 8.1 g/dL   Albumin 3.7 3.5 - 5.0 g/dL   AST 20 15 - 41 U/L   ALT 7 (L) 14 - 54 U/L   Alkaline Phosphatase 74 38 - 126 U/L   Total Bilirubin 0.4 0.3 - 1.2 mg/dL   GFR calc non Af Amer 10 (L) >60 mL/min   GFR calc Af Amer 12 (L) >60 mL/min    Comment: (NOTE) The eGFR has been calculated using the CKD EPI equation. This calculation has not been validated in all clinical situations. eGFR's persistently <60 mL/min signify possible Chronic Kidney Disease.    Anion gap 9 5 - 15  CBC     Status: Abnormal   Collection Time: 05/03/16  5:17 PM  Result Value Ref Range   WBC 9.8 3.6 - 11.0 K/uL   RBC 3.68 (L) 3.80 - 5.20 MIL/uL   Hemoglobin 9.8 (L) 12.0 - 16.0 g/dL   HCT 30.5 (L) 35.0 - 47.0 %   MCV 83.0 80.0 - 100.0 fL   MCH 26.7 26.0 - 34.0 pg   MCHC 32.2 32.0 - 36.0 g/dL   RDW 17.4 (H) 11.5 - 14.5 %   Platelets 234 150 - 440 K/uL   Urinalysis complete, with microscopic ([facility] only)     Status: Abnormal   Collection Time: 05/03/16  7:30 PM  Result Value Ref Range   Color, Urine STRAW (A) YELLOW   APPearance CLEAR (A) CLEAR   Glucose, UA NEGATIVE NEGATIVE mg/dL   Bilirubin Urine NEGATIVE NEGATIVE   Ketones, ur NEGATIVE NEGATIVE mg/dL   Specific Gravity, Urine 1.006 1.005 - 1.030   Hgb urine dipstick NEGATIVE NEGATIVE   pH 8.0 5.0 - 8.0   Protein, ur 30 (A) NEGATIVE mg/dL   Nitrite NEGATIVE NEGATIVE   Leukocytes, UA NEGATIVE NEGATIVE   RBC / HPF 0-5 0 - 5 RBC/hpf   WBC, UA 6-30 0 - 5 WBC/hpf   Bacteria, UA NONE SEEN NONE SEEN   Squamous Epithelial / LPF 0-5 (A) NONE SEEN   Mucous PRESENT   POCT urinalysis dipstick     Status: Abnormal   Collection Time: 06/13/16 11:07 AM  Result Value Ref Range   Color, UA yellow    Clarity, UA clear    Glucose, UA neg    Bilirubin, UA neg    Ketones, UA neg    Spec Grav, UA 1.025    Blood, UA trace    pH, UA 8.0    Protein, UA trace    Urobilinogen, UA negative    Nitrite, UA neg    Leukocytes, UA large (3+) (A) Negative  COMPLETE METABOLIC PANEL WITH GFR     Status: Abnormal   Collection Time: 06/13/16 12:13 PM  Result Value Ref Range   Sodium 142 135 - 146 mmol/L   Potassium 4.3 3.5 - 5.3 mmol/L   Chloride 107 98 - 110 mmol/L   CO2 22 20 - 31 mmol/L   Glucose, Bld 89 65 - 99 mg/dL   BUN 40 (H) 7 - 25 mg/dL   Creat 3.69 (H) 0.60 - 0.88 mg/dL    Comment:   For patients > or = 80 years of age: The upper reference limit for Creatinine is approximately 13% higher for people identified as African-American.      Total Bilirubin 0.3 0.2 - 1.2 mg/dL   Alkaline Phosphatase 81 33 - 130 U/L  AST 15 10 - 35 U/L   ALT 5 (L) 6 - 29 U/L   Total Protein 7.3 6.1 - 8.1 g/dL   Albumin 4.1 3.6 - 5.1 g/dL   Calcium 9.0 8.6 - 10.4 mg/dL   GFR, Est African American 12 (L) >=60 mL/min   GFR, Est Non African American 10 (L) >=60 mL/min  CBC with Differential/Platelet      Status: Abnormal   Collection Time: 06/13/16 12:13 PM  Result Value Ref Range   WBC 7.7 3.8 - 10.8 K/uL   RBC 3.85 3.80 - 5.10 MIL/uL   Hemoglobin 9.6 (L) 11.7 - 15.5 g/dL   HCT 31.1 (L) 35.0 - 45.0 %   MCV 80.8 80.0 - 100.0 fL   MCH 24.9 (L) 27.0 - 33.0 pg   MCHC 30.9 (L) 32.0 - 36.0 g/dL   RDW 17.8 (H) 11.0 - 15.0 %   Platelets 350 140 - 400 K/uL   MPV 10.0 7.5 - 12.5 fL   Neutro Abs 5,082 1,500 - 7,800 cells/uL   Lymphs Abs 1,848 850 - 3,900 cells/uL   Monocytes Absolute 539 200 - 950 cells/uL   Eosinophils Absolute 154 15 - 500 cells/uL   Basophils Absolute 77 0 - 200 cells/uL   Neutrophils Relative % 66 %   Lymphocytes Relative 24 %   Monocytes Relative 7 %   Eosinophils Relative 2 %   Basophils Relative 1 %   Smear Review Criteria for review not met   TSH     Status: None   Collection Time: 06/13/16 12:13 PM  Result Value Ref Range   TSH 1.03 mIU/L    Comment:   Reference Range   > or = 20 Years  0.40-4.50   Pregnancy Range First trimester  0.26-2.66 Second trimester 0.55-2.73 Third trimester  0.43-2.91     CULTURE, URINE COMPREHENSIVE     Status: None   Collection Time: 06/13/16 12:13 PM  Result Value Ref Range   Organism ID, Bacteria NO GROWTH   Glucose, capillary     Status: Abnormal   Collection Time: 06/21/16 10:12 PM  Result Value Ref Range   Glucose-Capillary 139 (H) 65 - 99 mg/dL  CBC     Status: Abnormal   Collection Time: 06/21/16 10:24 PM  Result Value Ref Range   WBC 8.0 3.6 - 11.0 K/uL   RBC 3.62 (L) 3.80 - 5.20 MIL/uL   Hemoglobin 9.3 (L) 12.0 - 16.0 g/dL   HCT 29.3 (L) 35.0 - 47.0 %   MCV 81.0 80.0 - 100.0 fL   MCH 25.7 (L) 26.0 - 34.0 pg   MCHC 31.8 (L) 32.0 - 36.0 g/dL   RDW 18.4 (H) 11.5 - 14.5 %   Platelets 268 150 - 440 K/uL  Differential     Status: None   Collection Time: 06/21/16 10:24 PM  Result Value Ref Range   Neutrophils Relative % 59 %   Neutro Abs 4.7 1.4 - 6.5 K/uL   Lymphocytes Relative 30 %   Lymphs Abs 2.4 1.0 -  3.6 K/uL   Monocytes Relative 7 %   Monocytes Absolute 0.6 0.2 - 0.9 K/uL   Eosinophils Relative 3 %   Eosinophils Absolute 0.2 0 - 0.7 K/uL   Basophils Relative 1 %   Basophils Absolute 0.1 0 - 0.1 K/uL  Comprehensive metabolic panel     Status: Abnormal   Collection Time: 06/21/16 10:24 PM  Result Value Ref Range   Sodium 140 135 - 145 mmol/L  Potassium 4.1 3.5 - 5.1 mmol/L   Chloride 109 101 - 111 mmol/L   CO2 22 22 - 32 mmol/L   Glucose, Bld 133 (H) 65 - 99 mg/dL   BUN 41 (H) 6 - 20 mg/dL   Creatinine, Ser 3.35 (H) 0.44 - 1.00 mg/dL   Calcium 8.7 (L) 8.9 - 10.3 mg/dL   Total Protein 7.4 6.5 - 8.1 g/dL   Albumin 3.7 3.5 - 5.0 g/dL   AST 18 15 - 41 U/L   ALT 7 (L) 14 - 54 U/L   Alkaline Phosphatase 79 38 - 126 U/L   Total Bilirubin 0.3 0.3 - 1.2 mg/dL   GFR calc non Af Amer 11 (L) >60 mL/min   GFR calc Af Amer 13 (L) >60 mL/min    Comment: (NOTE) The eGFR has been calculated using the CKD EPI equation. This calculation has not been validated in all clinical situations. eGFR's persistently <60 mL/min signify possible Chronic Kidney Disease.    Anion gap 9 5 - 15  Troponin I     Status: None   Collection Time: 06/21/16 10:24 PM  Result Value Ref Range   Troponin I <0.03 <0.03 ng/mL  Protime-INR     Status: None   Collection Time: 06/21/16 10:42 PM  Result Value Ref Range   Prothrombin Time 12.8 11.4 - 15.2 seconds   INR 0.96   APTT     Status: None   Collection Time: 06/21/16 10:42 PM  Result Value Ref Range   aPTT 33 24 - 36 seconds  Troponin I     Status: None   Collection Time: 06/22/16  1:29 AM  Result Value Ref Range   Troponin I <0.03 <0.03 ng/mL  Urine Drug Screen, Qualitative ([facility] only)     Status: None   Collection Time: 06/22/16  5:44 AM  Result Value Ref Range   Tricyclic, Ur Screen NONE DETECTED NONE DETECTED   Amphetamines, Ur Screen NONE DETECTED NONE DETECTED   MDMA (Ecstasy)Ur Screen NONE DETECTED NONE DETECTED   Cocaine Metabolite,Ur [place] NONE  DETECTED NONE DETECTED   Opiate, Ur Screen NONE DETECTED NONE DETECTED   Phencyclidine (PCP) Ur S NONE DETECTED NONE DETECTED   Cannabinoid 50 Ng, Ur [place] NONE DETECTED NONE DETECTED   Barbiturates, Ur Screen NONE DETECTED NONE DETECTED   Benzodiazepine, Ur Scrn NONE DETECTED NONE DETECTED   Methadone Scn, Ur NONE DETECTED NONE DETECTED    Comment: (NOTE) 712  Tricyclics, urine               Cutoff 1000 ng/mL 200  Amphetamines, urine             Cutoff 1000 ng/mL 300  MDMA (Ecstasy), urine           Cutoff 500 ng/mL 400  Cocaine Metabolite, urine       Cutoff 300 ng/mL 500  Opiate, urine                   Cutoff 300 ng/mL 600  Phencyclidine (PCP), urine      Cutoff 25 ng/mL 700  Cannabinoid, urine              Cutoff 50 ng/mL 800  Barbiturates, urine             Cutoff 200 ng/mL 900  Benzodiazepine, urine           Cutoff 200 ng/mL 1000 Methadone, urine  Cutoff 300 ng/mL 1100 1200 The urine drug screen provides only a preliminary, unconfirmed 1300 analytical test result and should not be used for non-medical 1400 purposes. Clinical consideration and professional judgment should 1500 be applied to any positive drug screen result due to possible 1600 interfering substances. A more specific alternate chemical method 1700 must be used in order to obtain a confirmed analytical result.  1800 Gas chromato graphy / mass spectrometry (GC/MS) is the preferred 1900 confirmatory method.   Urinalysis complete, with microscopic ([facility] only)     Status: Abnormal   Collection Time: 06/22/16  5:44 AM  Result Value Ref Range   Color, Urine STRAW (A) YELLOW   APPearance CLEAR (A) CLEAR   Glucose, UA NEGATIVE NEGATIVE mg/dL   Bilirubin Urine NEGATIVE NEGATIVE   Ketones, ur NEGATIVE NEGATIVE mg/dL   Specific Gravity, Urine 1.009 1.005 - 1.030   Hgb urine dipstick NEGATIVE NEGATIVE   pH 6.0 5.0 - 8.0   Protein, ur 30 (A) NEGATIVE mg/dL   Nitrite NEGATIVE NEGATIVE   Leukocytes, UA  NEGATIVE NEGATIVE   RBC / HPF NONE SEEN 0 - 5 RBC/hpf   WBC, UA 0-5 0 - 5 WBC/hpf   Bacteria, UA RARE (A) NONE SEEN   Squamous Epithelial / LPF 0-5 (A) NONE SEEN  Troponin I     Status: None   Collection Time: 06/22/16  8:15 AM  Result Value Ref Range   Troponin I <0.03 <0.03 ng/mL  Hemoglobin A1c     Status: None   Collection Time: 06/22/16  8:15 AM  Result Value Ref Range   Hgb A1c MFr Bld 5.6 4.8 - 5.6 %    Comment: (NOTE)         Pre-diabetes: 5.7 - 6.4         Diabetes: >6.4         Glycemic control for adults with diabetes: <7.0    Mean Plasma Glucose 114 mg/dL    Comment: (NOTE) Performed At: [facility] [place], [place] [id number] Lindon Romp MD QP:[id number]   Lipid panel     Status: None   Collection Time: 06/22/16  8:15 AM  Result Value Ref Range   Cholesterol 114 0 - 200 mg/dL   Triglycerides 124 <150 mg/dL   HDL 45 >40 mg/dL   Total CHOL/HDL Ratio 2.5 RATIO   VLDL 25 0 - 40 mg/dL   LDL Cholesterol 44 0 - 99 mg/dL    Comment:        Total Cholesterol/HDL:CHD Risk Coronary Heart Disease Risk Table                     Men   Women  1/2 Average Risk   3.4   3.3  Average Risk       5.0   4.4  2 X Average Risk   9.6   7.1  3 X Average Risk  23.4   11.0        Use the calculated Patient Ratio above and the CHD Risk Table to determine the patient's CHD Risk.        ATP III CLASSIFICATION (LDL):  <100     mg/dL   Optimal  100-129  mg/dL   Near or Above                    Optimal  130-159  mg/dL   Borderline  160-189  mg/dL   High  >190  mg/dL   Very High   Echocardiogram     Status: None   Collection Time: 06/22/16 11:25 AM  Result Value Ref Range   Weight 2,000 oz   Height 62 in   BP 108/63 mmHg  Troponin I     Status: None   Collection Time: 06/22/16  1:25 PM  Result Value Ref Range   Troponin I <0.03 <0.03 ng/mL  Troponin I     Status: None   Collection Time: 06/22/16  7:17 PM  Result Value Ref Range   Troponin I  <0.03 <0.03 ng/mL  Troponin I     Status: None   Collection Time: 06/23/16  1:55 AM  Result Value Ref Range   Troponin I <0.03 <0.03 ng/mL  Basic metabolic panel     Status: Abnormal   Collection Time: 06/23/16  1:55 AM  Result Value Ref Range   Sodium 144 135 - 145 mmol/L   Potassium 4.0 3.5 - 5.1 mmol/L   Chloride 116 (H) 101 - 111 mmol/L   CO2 22 22 - 32 mmol/L   Glucose, Bld 93 65 - 99 mg/dL   BUN 35 (H) 6 - 20 mg/dL   Creatinine, Ser 3.26 (H) 0.44 - 1.00 mg/dL   Calcium 8.4 (L) 8.9 - 10.3 mg/dL   GFR calc non Af Amer 12 (L) >60 mL/min   GFR calc Af Amer 13 (L) >60 mL/min    Comment: (NOTE) The eGFR has been calculated using the CKD EPI equation. This calculation has not been validated in all clinical situations. eGFR's persistently <60 mL/min signify possible Chronic Kidney Disease.    Anion gap 6 5 - 15  Hemoglobin     Status: Abnormal   Collection Time: 06/23/16  1:55 AM  Result Value Ref Range   Hemoglobin 7.8 (L) 12.0 - 16.0 g/dL  Troponin I     Status: None   Collection Time: 06/23/16  7:10 AM  Result Value Ref Range   Troponin I <0.03 <0.03 ng/mL  TSH     Status: None   Collection Time: 06/23/16  7:10 AM  Result Value Ref Range   TSH 0.420 0.350 - 4.500 uIU/mL    Comment: Performed by a 3rd Generation assay with a functional sensitivity of <=0.01 uIU/mL.     PHQ2/9: Depression screen [facility] 2/9 06/26/2016 06/13/2016 05/13/2016 10/16/2015 04/13/2015  Decreased Interest 0 0 0 0 0  Down, Depressed, Hopeless 0 0 0 0 0  PHQ - 2 Score 0 0 0 0 0     Fall Risk: Fall Risk  06/26/2016 06/13/2016 05/13/2016 10/16/2015 04/13/2015  Falls in the past year? Yes Yes No No No  Number falls in past yr: 2 or more 2 or more - - -  Injury with Fall? No Yes - - -  Risk Factor Category  - High Fall Risk - - -  Follow up - Falls evaluation completed - - -     Functional Status Survey: Is the patient deaf or have difficulty hearing?: No Does the patient have difficulty seeing,  even when wearing glasses/contacts?: Yes (glasses) Does the patient have difficulty concentrating, remembering, or making decisions?: No Does the patient have difficulty walking or climbing stairs?: Yes Does the patient have difficulty dressing or bathing?: Yes Does the patient have difficulty doing errands alone such as visiting a doctor's office or shopping?: Yes    Assessment & Plan  1. Recurrent falls  - Ambulatory referral to [place]  2. Hemiparesis affecting  right side as late effect of cerebrovascular accident (CVA) ([place])  - Ambulatory referral to [place]  3. Dysarthria due to recent cerebrovascular accident (CVA)  - Ambulatory referral to [place]  4. Hospital discharge follow-up  She is off bp medication for now, BP is at goal, bp to be checked by home health and we will resume medication if bp start to raise again

## 2016-06-27 ENCOUNTER — Ambulatory Visit: Payer: Medicare Other | Attending: Family Medicine | Admitting: Speech Pathology

## 2016-06-27 ENCOUNTER — Encounter: Payer: Self-pay | Admitting: Speech Pathology

## 2016-06-27 DIAGNOSIS — R471 Dysarthria and anarthria: Secondary | ICD-10-CM | POA: Insufficient documentation

## 2016-06-27 DIAGNOSIS — R4701 Aphasia: Secondary | ICD-10-CM

## 2016-06-27 NOTE — Therapy (Signed)
[place] MAIN [facility] SERVICES [address] [place], [place], [address] Phone: [phone]   Fax:  [phone]  Speech Language Pathology Treatment/Discharge Summary  Patient Details  Name: Crystal Faulkner MRN: [id number] Date of Birth: August 07, 1925 Referring Provider: Dr. Ancil Boozer  Encounter Date: 06/27/2016      End of Session - 06/27/16 1525    Visit Number 2   Number of Visits 2   Date for SLP Re-Evaluation 06/27/16   SLP Start Time 28   SLP Stop Time  1200   SLP Time Calculation (min) 60 min   Activity Tolerance Patient tolerated treatment well      Past Medical History:  Diagnosis Date  . Allergy   . Alzheimer's disease   . Anemia   . Chronic kidney disease   . Chronic stable angina ([place])   . Gout   . H/O: GI bleed   . Hypercholesteremia   . Hyperglycemia   . Hyperlipidemia   . Hypertension   . Incontinence   . Myocardial infarction   . Osteoporosis   . Stroke ([place])   . Thyroid disease   . Vitamin D deficiency     Past Surgical History:  Procedure Laterality Date  . EXPLORATORY LAPAROTOMY    . EYE SURGERY Bilateral    cataract    There were no vitals filed for this visit.      Subjective Assessment - 06/27/16 1524    Subjective The patient appears to be in better spirits   Patient is accompained by: Family member   Currently in Pain? No/denies               ADULT SLP TREATMENT - 06/27/16 0001      General Information   Behavior/Cognition Alert;Cooperative;Pleasant mood   HPI CVA     Pain Assessment   Pain Assessment No/denies pain     Cognitive-Linquistic Treatment   Treatment focused on Dysarthria;Apraxia   Skilled Treatment Reviewed suggested exercises from the evaluation session.  Today's session focused on bilabial consonant production in syllables and initial /r/ words.  The patient has good vowel production, which aids comprehension in context.  The patient was given written exercise  suggestions in addition to the exercises generated last session.  The family was provided with verbal explanation.  They have my card if the home health speech therapist would like further information.      Assessment / Recommendations / Plan   Plan Discharge SLP treatment due to (comment);Other (Comment)  To receive home health services     Progression Toward Goals   Progression toward goals Goals met, education completed, patient discharged from SLP  Goals not met; transition to home health          SLP Education - 06/27/16 1524    Education provided Yes   Education Details Patient given written exercises   Person(s) Educated Patient;Child(ren);Caregiver(s)   Methods Explanation;Demonstration;Verbal cues;Handout   Comprehension Verbalized understanding;Returned demonstration;Need further instruction            SLP Long Term Goals - 06/27/16 1527      SLP LONG TERM GOAL #1   Title Pt will improve oral motor strength and coordination by completing oral motor exercises with min SLP cues.   Status Not Met     SLP LONG TERM GOAL #2   Title Pt will improve articulatory precision in words by correct phonetic placement with 80% intelligibility.   Status Not Met     SLP  LONG TERM GOAL #3   Title Pt will improve speech intelligibility for phrases by controlling rate of speech, over-articulation, and increased loudness to achieve 80% intelligibility.   Status Not Met     SLP LONG TERM GOAL #4   Title Patient will demonstrate comprehension for moderately complex yes/no questions and 2-step commands   Status Not Met     SLP LONG TERM GOAL #5   Title Patient will demonstrate functional verbal expression for basic needs.   Status Not Met          Plan - 2016-06-29 1525    Clinical Impression Statement 82 year old woman admitted to [facility] 06/21/2016 secondary facial droop and slurred speech. The patient's family reported to the ED MD that the slurred speech began 48 hours earlier  (06/19/2016).  The patient's medical record shows that at a recent ED visit (05/03/2016) the patient was observed to have "normal speech and language.  No gross focal neurological deficits".  A head CT (06/21/2016) showed: "1. No definite CT evidence for acute intracranial abnormality.  2. Extensive periventricular and subcortical white matter hypodensity, presumed small vessel disease. Multiple old appearing bilateral basal ganglial and sub insular lacunar infarcts.  3. Moderate to marked global atrophy."  The patient cannot have an MRI.  She was evaluated at [facility] outpatient clinic 06/24/2016 and found to present with severe dysarthria characterized by minimal/absent lingual movement resulting in no discernable consonant production.  Struggle behavior with oral movement suggest a portion may be due to apraxia.  Screening shows the patient has reduced auditory comprehension.  She did not have sufficient verbal output to determine presence or extent of expressive aphasia.  The patient is to receive home health services so we will discharge from outpatient SLP at this time.   Speech Therapy Frequency Other (comment)  Discharge   Duration Other (comment)  Discharge   Treatment/Interventions Language facilitation;Oral motor exercises;Functional tasks;SLP instruction and feedback;Patient/family education   Potential to Achieve Goals Good   Potential Considerations Ability to learn/carryover information;Co-morbidities;Cooperation/participation level;Medical prognosis;Previous level of function;Severity of impairments;Family/community support   SLP Home Exercise Plan Patient and family given written exercises focusing on lingual movments and bilabial cosonant production   Consulted and Agree with Plan of Care Patient;Family member/caregiver   Family Member Consulted son and granddaughter      Patient will benefit from skilled therapeutic intervention in order to improve the following deficits and impairments:    Dysarthria and anarthria  Aphasia      G-Codes - 2016/06/29 1527    Functional Assessment Tool Used clinical judgement, western Aphasia Battery Screening, motor speech eval   Functional Limitations Motor speech   Motor Speech Current Status [phone]) At least 80 percent but less than 100 percent impaired, limited or restricted   Motor Speech Goal Status (A4536) At least 80 percent but less than 100 percent impaired, limited or restricted   Motor Speech Goal Status (I6803) At least 80 percent but less than 100 percent impaired, limited or restricted      Problem List Patient Active Problem List   Diagnosis Date Noted  . Dysarthria due to recent cerebrovascular accident (CVA) 06/23/2016  . Hemiparesis affecting right side as late effect of cerebrovascular accident (CVA) ([place]) 06/23/2016  . Hypotension 06/23/2016  . CKD (chronic kidney disease) stage 5, GFR less than 15 ml/min (HCC) 06/23/2016  . Anemia of chronic disease 06/23/2016  . Encephalopathy, metabolic 21/22/4825  . Intermittent confusion 06/21/2016  . Senile purpura ([place]) 05/13/2016  .  Hiatal hernia 05/04/2016  . Thoracic aorta atherosclerosis ([place]) 05/04/2016  . Chronic stable angina ([place]) 10/16/2015  . Secondary hyperparathyroidism ([place]) 04/13/2015  . Arteriosclerosis of coronary artery 04/08/2015  . Benign hypertension 04/08/2015  . Chronic kidney disease (CKD), stage V ([place]) 04/08/2015  . Controlled gout 04/08/2015  . Dyslipidemia 04/08/2015  . History of peptic ulcer disease 04/08/2015  . Adult hypothyroidism 04/08/2015  . Mild cognitive disorder 04/08/2015  . Anemia of chronic disease 04/08/2015  . Osteoarthrosis involving more than one site but not generalized 04/08/2015  . Osteoporosis 04/08/2015  . Pelvic muscle wasting 04/08/2015  . Allergic rhinitis 04/08/2015  . Tobacco use 04/08/2015  . CCF (congestive cardiac failure) ([place]) 03/29/2014  . HLD (hyperlipidemia) 03/29/2014  . H/O gastrointestinal  hemorrhage 04/05/2007   Leroy Sea, MS/CCC- SLP  Lou Miner 06/27/2016, 3:28 PM  [place] MAIN [facility] SERVICES [address] [place], [place], [address] Phone: [phone]   Fax:  [phone]   Name: Crystal Faulkner MRN: [id number] Date of Birth: 1924/10/25

## 2016-06-27 NOTE — Addendum Note (Signed)
Addended by: Robb Matar on: 06/27/2016 08:02 AM   Modules accepted: Orders

## 2016-06-27 NOTE — Telephone Encounter (Signed)
Patient requesting refill of Levothyroxine [facility].  

## 2016-06-28 DIAGNOSIS — I69351 Hemiplegia and hemiparesis following cerebral infarction affecting right dominant side: Secondary | ICD-10-CM | POA: Diagnosis not present

## 2016-06-28 DIAGNOSIS — I69322 Dysarthria following cerebral infarction: Secondary | ICD-10-CM | POA: Diagnosis not present

## 2016-06-30 DIAGNOSIS — I69351 Hemiplegia and hemiparesis following cerebral infarction affecting right dominant side: Secondary | ICD-10-CM | POA: Diagnosis not present

## 2016-06-30 DIAGNOSIS — I69322 Dysarthria following cerebral infarction: Secondary | ICD-10-CM | POA: Diagnosis not present

## 2016-07-01 DIAGNOSIS — I69322 Dysarthria following cerebral infarction: Secondary | ICD-10-CM | POA: Diagnosis not present

## 2016-07-01 DIAGNOSIS — I69351 Hemiplegia and hemiparesis following cerebral infarction affecting right dominant side: Secondary | ICD-10-CM | POA: Diagnosis not present

## 2016-07-02 DIAGNOSIS — I69322 Dysarthria following cerebral infarction: Secondary | ICD-10-CM | POA: Diagnosis not present

## 2016-07-02 DIAGNOSIS — I69351 Hemiplegia and hemiparesis following cerebral infarction affecting right dominant side: Secondary | ICD-10-CM | POA: Diagnosis not present

## 2016-07-03 ENCOUNTER — Ambulatory Visit: Payer: Medicare Other

## 2016-07-04 DIAGNOSIS — I69351 Hemiplegia and hemiparesis following cerebral infarction affecting right dominant side: Secondary | ICD-10-CM | POA: Diagnosis not present

## 2016-07-04 DIAGNOSIS — I69322 Dysarthria following cerebral infarction: Secondary | ICD-10-CM | POA: Diagnosis not present

## 2016-07-07 ENCOUNTER — Telehealth: Payer: Self-pay | Admitting: Family Medicine

## 2016-07-07 DIAGNOSIS — F0280 Dementia in other diseases classified elsewhere without behavioral disturbance: Secondary | ICD-10-CM | POA: Diagnosis not present

## 2016-07-07 DIAGNOSIS — R296 Repeated falls: Secondary | ICD-10-CM | POA: Diagnosis not present

## 2016-07-07 DIAGNOSIS — R29898 Other symptoms and signs involving the musculoskeletal system: Secondary | ICD-10-CM | POA: Diagnosis not present

## 2016-07-07 DIAGNOSIS — F0150 Vascular dementia without behavioral disturbance: Secondary | ICD-10-CM | POA: Diagnosis not present

## 2016-07-07 DIAGNOSIS — G309 Alzheimer's disease, unspecified: Secondary | ICD-10-CM | POA: Diagnosis not present

## 2016-07-07 DIAGNOSIS — I69351 Hemiplegia and hemiparesis following cerebral infarction affecting right dominant side: Secondary | ICD-10-CM | POA: Diagnosis not present

## 2016-07-07 DIAGNOSIS — I69322 Dysarthria following cerebral infarction: Secondary | ICD-10-CM | POA: Diagnosis not present

## 2016-07-07 DIAGNOSIS — R471 Dysarthria and anarthria: Secondary | ICD-10-CM | POA: Diagnosis not present

## 2016-07-07 NOTE — Telephone Encounter (Signed)
Crystal Faulkner from [facility] needing verbal for occupation therapy for once weekly for 2 weeks. Zero times a week for one week. One time a week for one week for training in ADL transfers, exercise, motor control programs, balance, fall prevention, health promotion and infection prevention. With okay to discharge when goals are met or a maxi um potential. (P) [phone]

## 2016-07-07 NOTE — Telephone Encounter (Signed)
Okay, please approve the referral. Thank you

## 2016-07-08 NOTE — Telephone Encounter (Signed)
I tried to contact Crystal Faulkner to let him know that it was ok to proceed as planned with the plan for occupational therapy, but there was no answer.   A message was left for him stating that Dr. Steele Sizer has given verbal permission to proceed as planned and if he had any questions to give [place] a call.

## 2016-07-09 ENCOUNTER — Other Ambulatory Visit: Payer: Self-pay | Admitting: Family Medicine

## 2016-07-09 DIAGNOSIS — E785 Hyperlipidemia, unspecified: Secondary | ICD-10-CM

## 2016-07-09 DIAGNOSIS — I69322 Dysarthria following cerebral infarction: Secondary | ICD-10-CM | POA: Diagnosis not present

## 2016-07-09 DIAGNOSIS — I69351 Hemiplegia and hemiparesis following cerebral infarction affecting right dominant side: Secondary | ICD-10-CM | POA: Diagnosis not present

## 2016-07-09 DIAGNOSIS — I2510 Atherosclerotic heart disease of native coronary artery without angina pectoris: Secondary | ICD-10-CM

## 2016-07-10 NOTE — Telephone Encounter (Signed)
Patient requesting refill of Rosuvastatin and [facility]roxine [facility].

## 2016-07-12 DIAGNOSIS — I69322 Dysarthria following cerebral infarction: Secondary | ICD-10-CM | POA: Diagnosis not present

## 2016-07-12 DIAGNOSIS — I69351 Hemiplegia and hemiparesis following cerebral infarction affecting right dominant side: Secondary | ICD-10-CM | POA: Diagnosis not present

## 2016-07-14 DIAGNOSIS — I69322 Dysarthria following cerebral infarction: Secondary | ICD-10-CM | POA: Diagnosis not present

## 2016-07-14 DIAGNOSIS — I69351 Hemiplegia and hemiparesis following cerebral infarction affecting right dominant side: Secondary | ICD-10-CM | POA: Diagnosis not present

## 2016-07-16 DIAGNOSIS — I69351 Hemiplegia and hemiparesis following cerebral infarction affecting right dominant side: Secondary | ICD-10-CM | POA: Diagnosis not present

## 2016-07-16 DIAGNOSIS — I69322 Dysarthria following cerebral infarction: Secondary | ICD-10-CM | POA: Diagnosis not present

## 2016-07-21 DIAGNOSIS — I69351 Hemiplegia and hemiparesis following cerebral infarction affecting right dominant side: Secondary | ICD-10-CM | POA: Diagnosis not present

## 2016-07-21 DIAGNOSIS — I69322 Dysarthria following cerebral infarction: Secondary | ICD-10-CM | POA: Diagnosis not present

## 2016-07-22 DIAGNOSIS — I69322 Dysarthria following cerebral infarction: Secondary | ICD-10-CM | POA: Diagnosis not present

## 2016-07-22 DIAGNOSIS — I69351 Hemiplegia and hemiparesis following cerebral infarction affecting right dominant side: Secondary | ICD-10-CM | POA: Diagnosis not present

## 2016-07-23 DIAGNOSIS — I69351 Hemiplegia and hemiparesis following cerebral infarction affecting right dominant side: Secondary | ICD-10-CM | POA: Diagnosis not present

## 2016-07-23 DIAGNOSIS — I69322 Dysarthria following cerebral infarction: Secondary | ICD-10-CM | POA: Diagnosis not present

## 2016-07-24 DIAGNOSIS — I69351 Hemiplegia and hemiparesis following cerebral infarction affecting right dominant side: Secondary | ICD-10-CM | POA: Diagnosis not present

## 2016-07-24 DIAGNOSIS — I69322 Dysarthria following cerebral infarction: Secondary | ICD-10-CM | POA: Diagnosis not present

## 2016-07-28 ENCOUNTER — Telehealth: Payer: Self-pay

## 2016-07-28 DIAGNOSIS — I69322 Dysarthria following cerebral infarction: Secondary | ICD-10-CM | POA: Diagnosis not present

## 2016-07-28 DIAGNOSIS — I69351 Hemiplegia and hemiparesis following cerebral infarction affecting right dominant side: Secondary | ICD-10-CM | POA: Diagnosis not present

## 2016-07-28 NOTE — Telephone Encounter (Signed)
okay

## 2016-07-28 NOTE — Telephone Encounter (Signed)
[facility] Speech Therapist called to get verbal authorization for 2 additional speech therapy appointments for patient.

## 2016-07-29 DIAGNOSIS — I69322 Dysarthria following cerebral infarction: Secondary | ICD-10-CM | POA: Diagnosis not present

## 2016-07-29 DIAGNOSIS — I69351 Hemiplegia and hemiparesis following cerebral infarction affecting right dominant side: Secondary | ICD-10-CM | POA: Diagnosis not present

## 2016-07-31 DIAGNOSIS — I69322 Dysarthria following cerebral infarction: Secondary | ICD-10-CM | POA: Diagnosis not present

## 2016-07-31 DIAGNOSIS — I69351 Hemiplegia and hemiparesis following cerebral infarction affecting right dominant side: Secondary | ICD-10-CM | POA: Diagnosis not present

## 2016-08-06 DIAGNOSIS — I69322 Dysarthria following cerebral infarction: Secondary | ICD-10-CM | POA: Diagnosis not present

## 2016-08-06 DIAGNOSIS — I69351 Hemiplegia and hemiparesis following cerebral infarction affecting right dominant side: Secondary | ICD-10-CM | POA: Diagnosis not present

## 2016-08-11 DIAGNOSIS — I69322 Dysarthria following cerebral infarction: Secondary | ICD-10-CM | POA: Diagnosis not present

## 2016-08-11 DIAGNOSIS — I69351 Hemiplegia and hemiparesis following cerebral infarction affecting right dominant side: Secondary | ICD-10-CM | POA: Diagnosis not present

## 2016-08-20 ENCOUNTER — Telehealth: Payer: Self-pay

## 2016-08-20 NOTE — Telephone Encounter (Signed)
Patient Insurance called to notify physician with medication changes; due to patient having Medicare Part D they will no longer cover [place]. But they do prefer Omeprazole and Pantoprazole.

## 2016-08-23 ENCOUNTER — Other Ambulatory Visit: Payer: Self-pay | Admitting: Family Medicine

## 2016-08-23 MED ORDER — PANTOPRAZOLE SODIUM 40 MG PO TBEC: 40.0000 mg | DELAYED_RELEASE_TABLET | Freq: Every day | ORAL | 1 refills | Status: DC

## 2016-09-03 ENCOUNTER — Other Ambulatory Visit: Payer: Self-pay | Admitting: Family Medicine

## 2016-11-04 DIAGNOSIS — R29898 Other symptoms and signs involving the musculoskeletal system: Secondary | ICD-10-CM | POA: Diagnosis not present

## 2016-11-04 DIAGNOSIS — I120 Hypertensive chronic kidney disease with stage 5 chronic kidney disease or end stage renal disease: Secondary | ICD-10-CM | POA: Diagnosis not present

## 2016-11-04 DIAGNOSIS — G309 Alzheimer's disease, unspecified: Secondary | ICD-10-CM | POA: Diagnosis not present

## 2016-11-04 DIAGNOSIS — F0150 Vascular dementia without behavioral disturbance: Secondary | ICD-10-CM | POA: Diagnosis not present

## 2016-11-04 DIAGNOSIS — R296 Repeated falls: Secondary | ICD-10-CM | POA: Diagnosis not present

## 2016-11-04 DIAGNOSIS — R471 Dysarthria and anarthria: Secondary | ICD-10-CM | POA: Diagnosis not present

## 2016-11-04 DIAGNOSIS — F0280 Dementia in other diseases classified elsewhere without behavioral disturbance: Secondary | ICD-10-CM | POA: Diagnosis not present

## 2016-11-04 DIAGNOSIS — N185 Chronic kidney disease, stage 5: Secondary | ICD-10-CM | POA: Diagnosis not present

## 2016-11-10 ENCOUNTER — Encounter: Payer: Self-pay | Admitting: Family Medicine

## 2016-11-10 ENCOUNTER — Ambulatory Visit (INDEPENDENT_AMBULATORY_CARE_PROVIDER_SITE_OTHER): Payer: Medicare Other | Admitting: Family Medicine

## 2016-11-10 VITALS — BP 132/78 | HR 85 | Temp 97.7°F | Resp 16 | Ht 62.0 in | Wt 106.6 lb

## 2016-11-10 DIAGNOSIS — I69322 Dysarthria following cerebral infarction: Secondary | ICD-10-CM

## 2016-11-10 DIAGNOSIS — I209 Angina pectoris, unspecified: Secondary | ICD-10-CM

## 2016-11-10 DIAGNOSIS — E038 Other specified hypothyroidism: Secondary | ICD-10-CM | POA: Diagnosis not present

## 2016-11-10 DIAGNOSIS — I208 Other forms of angina pectoris: Secondary | ICD-10-CM | POA: Diagnosis not present

## 2016-11-10 DIAGNOSIS — N185 Chronic kidney disease, stage 5: Secondary | ICD-10-CM

## 2016-11-10 DIAGNOSIS — I2510 Atherosclerotic heart disease of native coronary artery without angina pectoris: Secondary | ICD-10-CM

## 2016-11-10 DIAGNOSIS — I69351 Hemiplegia and hemiparesis following cerebral infarction affecting right dominant side: Secondary | ICD-10-CM | POA: Diagnosis not present

## 2016-11-10 DIAGNOSIS — R2681 Unsteadiness on feet: Secondary | ICD-10-CM

## 2016-11-10 DIAGNOSIS — N2581 Secondary hyperparathyroidism of renal origin: Secondary | ICD-10-CM | POA: Diagnosis not present

## 2016-11-10 DIAGNOSIS — I2089 Other forms of angina pectoris: Secondary | ICD-10-CM

## 2016-11-10 DIAGNOSIS — I700 Atherosclerosis of aorta: Secondary | ICD-10-CM | POA: Diagnosis not present

## 2016-11-10 DIAGNOSIS — E441 Mild protein-calorie malnutrition: Secondary | ICD-10-CM | POA: Diagnosis not present

## 2016-11-10 MED ORDER — CLOPIDOGREL BISULFATE 75 MG PO TABS: 75.0000 mg | ORAL_TABLET | Freq: Every day | ORAL | 1 refills | Status: DC

## 2016-11-10 MED ORDER — LEVOTHYROXINE SODIUM 75 MCG PO TABS: 75.0000 ug | ORAL_TABLET | Freq: Every day | ORAL | 1 refills | Status: DC

## 2016-11-10 NOTE — Progress Notes (Signed)
Name: Crystal Faulkner   MRN: [id number]    DOB: [birth year]   Date:11/10/2016       Progress Note  Subjective  Chief Complaint  Chief Complaint  Patient presents with  . Chronic Kidney Disease  . Cyst    on left hip since Saturday  . arteriosclerosis of coronary artery    HPI  Protein malnutrition/CVA : she has lost another 5 lbs since her last visit with me Nov 2017. She had a CVA back in October, she has dysarthria , left side hemiparesis and difficulty swallowing. Family has to crush her medications and mix in apple sauce. Personality seems the same. Advised to let her eat whatever she feels like.  Hypothyroidism: taking medication daily as prescribed, no constipation, she has dry skin but controlled with lotion, she has a dry spot on right outer hip noticed a couple  days ago, no pain ( seems like irritation from excoriation )   Senile purpura: bruises easily and takes aspirin, explained common to have it at her age  HTN: bp is well controlled, off bp medication still takes diuretic  Angina: taking Imdur daily and denies chest pain, also taking statin therapy   GERD: under control, off Dexilant because of cost, prescription of pantoprazole should be at pharmacy to be picked up  Mild cognitive impairment: stable, no wondering, able to take shower, eats by herself, but needs help coming to the doctor and family member manages her money and dispense her medication. Neurologist offered to start on medication, but family prefers holding off on that   [place] with anemia or chronic disease: seeing nephrologist, refuses HD, no pruritus.   Controlled Gout: no recent episodes  Atherosclerosis aorta: taking aspirin and statin therapy   Patient Active Problem List   Diagnosis Date Noted  . Dysarthria due to recent cerebrovascular accident (CVA) 06/23/2016  . Hemiparesis affecting right side as late effect of cerebrovascular accident (CVA) ([place]) 06/23/2016  . Hypotension  06/23/2016  . CKD (chronic kidney disease) stage 5, GFR less than 15 ml/min (HCC) 06/23/2016  . Anemia of chronic disease 06/23/2016  . Encephalopathy, metabolic 00/93/8182  . Intermittent confusion 06/21/2016  . Senile purpura ([place]) 05/13/2016  . Hiatal hernia 05/04/2016  . Thoracic aorta atherosclerosis ([place]) 05/04/2016  . Chronic stable angina ([place]) 10/16/2015  . Secondary hyperparathyroidism ([place]) 04/13/2015  . Arteriosclerosis of coronary artery 04/08/2015  . Benign hypertension 04/08/2015  . Chronic kidney disease (CKD), stage V ([place]) 04/08/2015  . Controlled gout 04/08/2015  . Dyslipidemia 04/08/2015  . History of peptic ulcer disease 04/08/2015  . Adult hypothyroidism 04/08/2015  . Mild cognitive disorder 04/08/2015  . Anemia of chronic disease 04/08/2015  . Osteoarthrosis involving more than one site but not generalized 04/08/2015  . Osteoporosis 04/08/2015  . Pelvic muscle wasting 04/08/2015  . Allergic rhinitis 04/08/2015  . Tobacco use 04/08/2015  . CCF (congestive cardiac failure) ([place]) 03/29/2014  . HLD (hyperlipidemia) 03/29/2014  . H/O gastrointestinal hemorrhage 04/05/2007    Past Surgical History:  Procedure Laterality Date  . EXPLORATORY LAPAROTOMY    . EYE SURGERY Bilateral    cataract    Family History  Problem Relation Age of Onset  . Diabetes Sister   . Hypertension Sister     Social History   Social History  . Marital status: Widowed    Spouse name: N/A  . Number of children: N/A  . Years of education: N/A   Occupational History  . Not on file.   Social  History Main Topics  . Smoking status: Never Smoker  . Smokeless tobacco: Current User    Types: Snuff  . Alcohol use No  . Drug use: No  . Sexual activity: No   Other Topics Concern  . Not on file   Social History Narrative   She lives with her daughter.    She also has her nephew and niece at home.    Had a stroke October 2017 and has difficulty walking and also unable to  talk since.          Current Outpatient Prescriptions:  .  allopurinol (ZYLOPRIM) 100 MG tablet, Take 1 tablet (100 mg total) by mouth daily., Disp: 90 tablet, Rfl: 2 .  aspirin 81 MG tablet, Take 1 tablet by mouth daily., Disp: , Rfl:  .  Cholecalciferol (VITAMIN D3) 2000 UNITS capsule, Take 1 capsule by mouth daily., Disp: , Rfl:  .  clopidogrel (PLAVIX) 75 MG tablet, Take 1 tablet (75 mg total) by mouth daily., Disp: 90 tablet, Rfl: 1 .  ferrous sulfate 325 (65 FE) MG tablet, Take 325 mg by mouth 2 (two) times daily with a meal., Disp: , Rfl:  .  furosemide (LASIX) 40 MG tablet, Take 1 tablet (40 mg total) by mouth daily., Disp: 90 tablet, Rfl: 1 .  isosorbide mononitrate (IMDUR) 60 MG 24 hr tablet, Take 1 tablet (60 mg total) by mouth daily., Disp: 90 tablet, Rfl: 2 .  levothyroxine (SYNTHROID, LEVOTHROID) 75 MCG tablet, Take 1 tablet (75 mcg total) by mouth daily at 6 (six) AM., Disp: 90 tablet, Rfl: 1 .  Multiple Vitamin (MULTI-VITAMINS) TABS, Take by mouth. Reported on 10/16/2015, Disp: , Rfl:  .  pantoprazole (PROTONIX) 40 MG tablet, Take 1 tablet (40 mg total) by mouth daily., Disp: 90 tablet, Rfl: 1 .  raloxifene (EVISTA) 60 MG tablet, TAKE 1 TABLET BY MOUTH EVERY DAY, Disp: 90 tablet, Rfl: 2 .  rosuvastatin (CRESTOR) 10 MG tablet, TAKE 1 TABLET (10 MG TOTAL) BY MOUTH DAILY., Disp: 90 tablet, Rfl: 2 .  senna-docusate (SENOKOT-S) 8.6-50 MG tablet, Take 1 tablet by mouth at bedtime as needed for mild constipation or moderate constipation., Disp: 30 tablet, Rfl: 5 .  sodium bicarbonate 650 MG tablet, TAKE 2 TABLETS (1,300 MG TOTAL) BY MOUTH TWO (2) TIMES A DAY., Disp: , Rfl: 11  No Known Allergies   ROS  Constitutional: Negative for fever, positive for  weight change.  Respiratory: Negative for cough and shortness of breath.   Cardiovascular: Negative for chest pain or palpitations.  Gastrointestinal: Negative for abdominal pain, no bowel changes.  Musculoskeletal: Positive  for  gait problem she has  joint swelling both knees  Skin: Negative for rash.  Neurological: Negative for dizziness or headache.  No other specific complaints in a complete review of systems (except as listed in HPI above).  Objective  Vitals:   11/10/16 1127  BP: 132/78  Pulse: 85  Resp: 16  Temp: 97.7 F (36.5 C)  SpO2: 97%  Weight: 106 lb 9 oz (48.3 kg)  Height: 5\' 2"  (1.575 m)    Body mass index is 19.49 kg/m.  Physical Exam   Constitutional: Patient appears alert. No distress.  HEENT: head atraumatic, normocephalic, pupils equal and reactive to light, neck supple, throat within normal limits Cardiovascular: Normal rate, regular rhythm and normal heart sounds. No murmur heard. No BLE edema. Pulmonary/Chest: Effort normal and breath sounds normal. No respiratory distress. Abdominal: Soft. There is no tenderness. Psychiatric: Patient has  a normal mood and affect. Unable to speak Muscular Skeletal: crepitus both knees, decrease extension right knee,has a cane, antalgic gait Neurologist: cranial nerves intact   PHQ2/9: Depression screen [facility] 2/9 06/26/2016 06/13/2016 05/13/2016 10/16/2015 04/13/2015  Decreased Interest 0 0 0 0 0  Down, Depressed, Hopeless 0 0 0 0 0  PHQ - 2 Score 0 0 0 0 0     Fall Risk: Fall Risk  06/26/2016 06/13/2016 05/13/2016 10/16/2015 04/13/2015  Falls in the past year? Yes Yes No No No  Number falls in past yr: 2 or more 2 or more - - -  Injury with Fall? No Yes - - -  Risk Factor Category  - High Fall Risk - - -  Follow up - Falls evaluation completed - - -     Assessment & Plan  1. Hemiparesis affecting right side as late effect of cerebrovascular accident (CVA) ([place])  - clopidogrel (PLAVIX) 75 MG tablet; Take 1 tablet (75 mg total) by mouth daily.  Dispense: 90 tablet; Refill: 1  2. Dysarthria as late effect of cerebrovascular accident (CVA)  - clopidogrel (PLAVIX) 75 MG tablet; Take 1 tablet (75 mg total) by mouth daily.  Dispense: 90  tablet; Refill: 1  3. Arteriosclerosis of coronary artery  - clopidogrel (PLAVIX) 75 MG tablet; Take 1 tablet (75 mg total) by mouth daily.  Dispense: 90 tablet; Refill: 1  4. Chronic kidney disease (CKD), stage V ([place])  She goes to Dr. Johnney Ou at [facility], reviewed labs with patient and her daughter today   5. Other specified hypothyroidism  - levothyroxine (SYNTHROID, LEVOTHROID) 75 MCG tablet; Take 1 tablet (75 mcg total) by mouth daily at 6 (six) AM.  Dispense: 90 tablet; Refill: 1  6. Chronic stable angina (HCC)  - clopidogrel (PLAVIX) 75 MG tablet; Take 1 tablet (75 mg total) by mouth daily.  Dispense: 90 tablet; Refill: 1  7. Gait instability  She had PT after stroke and daughter states they did not recommend a walker because she would trip, she does not like using a cane, no recent falls.   8. Mild protein-calorie malnutrition ([place])  She has lost her appetite since stroke, it may be secondary to difficulty swallowing, she completed speech therapy, advised to let her eat multiple times a day, whatever she feels like eating.   9. Thoracic aorta atherosclerosis ([place])  On statin therapy and plavix  10. Secondary hyperparathyroidism ([place])  From [facility]

## 2016-11-11 DIAGNOSIS — I120 Hypertensive chronic kidney disease with stage 5 chronic kidney disease or end stage renal disease: Secondary | ICD-10-CM | POA: Diagnosis not present

## 2016-11-11 DIAGNOSIS — N185 Chronic kidney disease, stage 5: Secondary | ICD-10-CM | POA: Diagnosis not present

## 2016-11-11 DIAGNOSIS — D631 Anemia in chronic kidney disease: Secondary | ICD-10-CM | POA: Diagnosis not present

## 2016-12-16 ENCOUNTER — Other Ambulatory Visit: Payer: Self-pay | Admitting: Family Medicine

## 2016-12-16 DIAGNOSIS — Z8711 Personal history of peptic ulcer disease: Secondary | ICD-10-CM

## 2016-12-17 ENCOUNTER — Telehealth: Payer: Self-pay | Admitting: Family Medicine

## 2017-01-16 NOTE — Telephone Encounter (Signed)
ERRENOUS °

## 2017-01-23 ENCOUNTER — Emergency Department: Payer: Medicare Other

## 2017-01-23 ENCOUNTER — Emergency Department
Admission: EM | Admit: 2017-01-23 | Discharge: 2017-01-23 | Disposition: A | Discharge status: Home / Self Care | Payer: Medicare Other | Attending: Emergency Medicine | Admitting: Emergency Medicine

## 2017-01-23 DIAGNOSIS — N185 Chronic kidney disease, stage 5: Secondary | ICD-10-CM | POA: Diagnosis not present

## 2017-01-23 DIAGNOSIS — N390 Urinary tract infection, site not specified: Secondary | ICD-10-CM | POA: Diagnosis not present

## 2017-01-23 DIAGNOSIS — E039 Hypothyroidism, unspecified: Secondary | ICD-10-CM | POA: Insufficient documentation

## 2017-01-23 DIAGNOSIS — I2510 Atherosclerotic heart disease of native coronary artery without angina pectoris: Secondary | ICD-10-CM | POA: Diagnosis not present

## 2017-01-23 DIAGNOSIS — R29818 Other symptoms and signs involving the nervous system: Secondary | ICD-10-CM | POA: Diagnosis present

## 2017-01-23 DIAGNOSIS — I120 Hypertensive chronic kidney disease with stage 5 chronic kidney disease or end stage renal disease: Secondary | ICD-10-CM | POA: Diagnosis not present

## 2017-01-23 DIAGNOSIS — F17228 Nicotine dependence, chewing tobacco, with other nicotine-induced disorders: Secondary | ICD-10-CM | POA: Insufficient documentation

## 2017-01-23 DIAGNOSIS — N3090 Cystitis, unspecified without hematuria: Secondary | ICD-10-CM | POA: Diagnosis not present

## 2017-01-23 DIAGNOSIS — R4182 Altered mental status, unspecified: Secondary | ICD-10-CM | POA: Diagnosis not present

## 2017-01-23 DIAGNOSIS — R531 Weakness: Secondary | ICD-10-CM | POA: Diagnosis not present

## 2017-01-23 DIAGNOSIS — G459 Transient cerebral ischemic attack, unspecified: Secondary | ICD-10-CM | POA: Diagnosis not present

## 2017-01-23 DIAGNOSIS — R410 Disorientation, unspecified: Secondary | ICD-10-CM | POA: Diagnosis not present

## 2017-01-23 LAB — URINALYSIS, COMPLETE (UACMP) WITH MICROSCOPIC
BILIRUBIN URINE: NEGATIVE
Glucose, UA: NEGATIVE mg/dL
Hgb urine dipstick: NEGATIVE
KETONES UR: NEGATIVE mg/dL
NITRITE: NEGATIVE
PROTEIN: NEGATIVE mg/dL
Specific Gravity, Urine: 1.006 (ref 1.005–1.030)
pH: 7 (ref 5.0–8.0)

## 2017-01-23 LAB — CBC WITH DIFFERENTIAL/PLATELET
BASOS PCT: 1 %
Basophils Absolute: 0.1 10*3/uL (ref 0–0.1)
EOS ABS: 0.1 10*3/uL (ref 0–0.7)
Eosinophils Relative: 3 %
HCT: 26.8 % — ABNORMAL LOW (ref 35.0–47.0)
HEMOGLOBIN: 8.3 g/dL — AB (ref 12.0–16.0)
Lymphocytes Relative: 26 %
Lymphs Abs: 1.5 10*3/uL (ref 1.0–3.6)
MCH: 25.5 pg — ABNORMAL LOW (ref 26.0–34.0)
MCHC: 31.1 g/dL — AB (ref 32.0–36.0)
MCV: 82.1 fL (ref 80.0–100.0)
MONO ABS: 0.5 10*3/uL (ref 0.2–0.9)
MONOS PCT: 8 %
NEUTROS PCT: 62 %
Neutro Abs: 3.5 10*3/uL (ref 1.4–6.5)
Platelets: 244 10*3/uL (ref 150–440)
RBC: 3.26 MIL/uL — ABNORMAL LOW (ref 3.80–5.20)
RDW: 16.7 % — AB (ref 11.5–14.5)
WBC: 5.6 10*3/uL (ref 3.6–11.0)

## 2017-01-23 LAB — COMPREHENSIVE METABOLIC PANEL
ALBUMIN: 3.7 g/dL (ref 3.5–5.0)
ALT: 11 U/L — ABNORMAL LOW (ref 14–54)
ANION GAP: 7 (ref 5–15)
AST: 18 U/L (ref 15–41)
Alkaline Phosphatase: 63 U/L (ref 38–126)
BUN: 43 mg/dL — ABNORMAL HIGH (ref 6–20)
CO2: 25 mmol/L (ref 22–32)
Calcium: 8.7 mg/dL — ABNORMAL LOW (ref 8.9–10.3)
Chloride: 108 mmol/L (ref 101–111)
Creatinine, Ser: 3.34 mg/dL — ABNORMAL HIGH (ref 0.44–1.00)
GFR calc Af Amer: 13 mL/min — ABNORMAL LOW (ref >60)
GFR calc non Af Amer: 11 mL/min — ABNORMAL LOW (ref >60)
GLUCOSE: 108 mg/dL — AB (ref 65–99)
Potassium: 4.1 mmol/L (ref 3.5–5.1)
SODIUM: 140 mmol/L (ref 135–145)
Total Bilirubin: 0.3 mg/dL (ref 0.3–1.2)
Total Protein: 7.1 g/dL (ref 6.5–8.1)

## 2017-01-23 LAB — TROPONIN I: Troponin I: <0.03 ng/mL (ref <0.03)

## 2017-01-23 MED ORDER — CEFTRIAXONE SODIUM IN DEXTROSE 20 MG/ML IV SOLN: 1.0000 g | Freq: Once | INTRAVENOUS | Status: DC

## 2017-01-23 MED ORDER — CEPHALEXIN 250 MG PO CAPS: 250.0000 mg | ORAL_CAPSULE | Freq: Four times a day (QID) | ORAL | 0 refills | Status: AC

## 2017-01-23 MED ORDER — DEXTROSE 5 % IV SOLN
1.0000 g | Freq: Once | INTRAVENOUS | Status: AC
  Administered 2017-01-23: 1 g via INTRAVENOUS
  Filled 2017-01-23 (×2): qty 10

## 2017-01-23 NOTE — ED Provider Notes (Signed)
[facility] Emergency Department Provider Note       Time seen: ----------------------------------------- 6:31 PM on 01/23/2017 -----------------------------------------     I have reviewed the triage vital signs and the nursing notes.   HISTORY   Chief Complaint No chief complaint on file.    HPI [place] is a 83 y.o. female who presents to the ED for acute onset of confusion today. Patient was reportedly at baseline yesterday. She had 2 episodes today where she was confused and not acting normally. She was also noted to have altered gait today around 4 PM. The EMS confirms that this upon their arrival. She denies any recent illness or other complaints. Patient has previous expressive aphasia and weakness from prior stroke   Past Medical History:  Diagnosis Date  . Allergy   . Alzheimer's disease   . Anemia   . CCF (congestive cardiac failure) ([place])   . Chronic kidney disease   . Chronic stable angina ([place])   . Gout   . H/O: GI bleed   . Hypercholesteremia   . Hyperglycemia   . Hyperlipidemia   . Hypertension   . Incontinence   . Myocardial infarction   . Osteoporosis   . Stroke ([place])   . Thyroid disease   . Vitamin D deficiency     Patient Active Problem List   Diagnosis Date Noted  . Dysarthria due to recent cerebrovascular accident (CVA) 06/23/2016  . Hemiparesis affecting right side as late effect of cerebrovascular accident (CVA) ([place]) 06/23/2016  . Hypotension 06/23/2016  . CKD (chronic kidney disease) stage 5, GFR less than 15 ml/min (HCC) 06/23/2016  . Anemia of chronic disease 06/23/2016  . Encephalopathy, metabolic 42/59/5638  . Intermittent confusion 06/21/2016  . Senile purpura ([place]) 05/13/2016  . Hiatal hernia 05/04/2016  . Thoracic aorta atherosclerosis ([place]) 05/04/2016  . Chronic stable angina ([place]) 10/16/2015  . Secondary hyperparathyroidism ([place]) 04/13/2015  . Arteriosclerosis of coronary artery 04/08/2015   . Benign hypertension 04/08/2015  . Chronic kidney disease (CKD), stage V ([place]) 04/08/2015  . Controlled gout 04/08/2015  . Dyslipidemia 04/08/2015  . History of peptic ulcer disease 04/08/2015  . Adult hypothyroidism 04/08/2015  . Mild cognitive disorder 04/08/2015  . Anemia of chronic disease 04/08/2015  . Osteoarthrosis involving more than one site but not generalized 04/08/2015  . Osteoporosis 04/08/2015  . Pelvic muscle wasting 04/08/2015  . Allergic rhinitis 04/08/2015  . Tobacco use 04/08/2015  . CCF (congestive cardiac failure) ([place]) 03/29/2014  . HLD (hyperlipidemia) 03/29/2014  . H/O gastrointestinal hemorrhage 04/05/2007    Past Surgical History:  Procedure Laterality Date  . EXPLORATORY LAPAROTOMY    . EYE SURGERY Bilateral    cataract    Allergies Patient has no known allergies.  Social History Social History  Substance Use Topics  . Smoking status: Never Smoker  . Smokeless tobacco: Current User    Types: Snuff  . Alcohol use No    Review of Systems Constitutional: Negative for fever. Eyes: Negative for vision changes ENT:  Negative for congestion, sore throat Cardiovascular: Negative for chest pain. Respiratory: Negative for shortness of breath. Gastrointestinal: Negative for abdominal pain, vomiting and diarrhea. Genitourinary: Negative for dysuria. Musculoskeletal: Negative for back pain. Skin: Negative for rash. Neurological: Negative for headaches,Positive for right leg weakness  All systems negative/normal/unremarkable except as stated in the HPI  ____________________________________________   PHYSICAL EXAM:  VITAL SIGNS: ED Triage Vitals  Enc Vitals Group     BP  Pulse      Resp      Temp      Temp src      SpO2      Weight      Height      Head Circumference      Peak Flow      Pain Score      Pain Loc      Pain Edu?      Excl. in [place]?     Constitutional: Alert and oriented. Well appearing and in no distress. Eyes:  Conjunctivae are normal. Normal extraocular movements. ENT   Head: Normocephalic and atraumatic.   Nose: No congestion/rhinnorhea.   Mouth/Throat: Mucous membranes are moist.   Neck: No stridor. Cardiovascular: Normal rate, regular rhythm. No murmurs, rubs, or gallops. Respiratory: Normal respiratory effort without tachypnea nor retractions. Breath sounds are clear and equal bilaterally. No wheezes/rales/rhonchi. Gastrointestinal: Soft and nontender. Normal bowel sounds Musculoskeletal: Nontender with normal range of motion in extremities. No lower extremity tenderness nor edema. Neurologic:  No gross focal neurologic deficits are appreciated. Chronic right sided weakness, right leg weaker than normal Skin:  Skin is warm, dry and intact. No rash noted. Psychiatric: Mood and affect are normal. Chronic expressive aphasia ____________________________________________  EKG: Interpreted by me. Sinus rhythm rate of 77 bpm, normal PR interval, normal QRS, normal QT, left axis deviation  ____________________________________________  ED COURSE:  Pertinent labs & imaging results that were available during my care of the patient were reviewed by me and considered in my medical decision making (see chart for details). Patient presents for weakness and altered mental status, we will assess with labs and imaging as indicated.   Procedures ____________________________________________   LABS (pertinent positives/negatives)  Labs Reviewed  CBC WITH DIFFERENTIAL/PLATELET - Abnormal; Notable for the following:       Result Value   RBC 3.26 (*)    Hemoglobin 8.3 (*)    HCT 26.8 (*)    MCH 25.5 (*)    MCHC 31.1 (*)    RDW 16.7 (*)    All other components within normal limits  COMPREHENSIVE METABOLIC PANEL - Abnormal; Notable for the following:    Glucose, Bld 108 (*)    BUN 43 (*)    Creatinine, Ser 3.34 (*)    Calcium 8.7 (*)    ALT 11 (*)    GFR calc non Af Amer 11 (*)    GFR  calc Af Amer 13 (*)    All other components within normal limits  URINALYSIS, COMPLETE (UACMP) WITH MICROSCOPIC - Abnormal; Notable for the following:    Color, Urine STRAW (*)    APPearance CLEAR (*)    Leukocytes, UA MODERATE (*)    Bacteria, UA MANY (*)    Squamous Epithelial / LPF 0-5 (*)    All other components within normal limits  URINE CULTURE  TROPONIN I  CBG MONITORING, ED    RADIOLOGY Images were viewed by me  CT head IMPRESSION: No acute abnormality.  Atrophy, chronic microvascular ischemic change and remote infarcts as described above. ____________________________________________  FINAL ASSESSMENT AND PLAN  Weakness, Cystitis  Plan: Patient's labs and imaging were dictated above. Patient had presented for altered mental status and possibly having difficulty walking. Here she was able to walk as normal. I did give her IV Rocephin to begin treating a UTI but the remainder of her workup has been negative. It is unclear if she would benefit from hospitalization as it is doubtful that  she has had a TIA. Even if she were to have a TIA she is on adequate medication at this time. We will advise close outpatient follow-up.   Earleen Newport, MD   Note: This note was generated in part or whole with voice recognition software. Voice recognition is usually quite accurate but there are transcription errors that can and very often do occur. I apologize for any typographical errors that were not detected and corrected.     Earleen Newport, MD 01/23/17 2128

## 2017-01-23 NOTE — ED Notes (Signed)
Report given to April RN

## 2017-01-23 NOTE — ED Notes (Signed)
Per ed techs beth and than, pt was able to ambulate.

## 2017-01-23 NOTE — ED Notes (Signed)
Pharmacy called for rocephin, out in pyxis. Family and pt updated on plan of care. Will attempt to ambulate pt after antibiotic infusion.

## 2017-01-23 NOTE — ED Triage Notes (Signed)
Patient woke this morning with an acute onset of confusion. Patient is baseline non-verbal from a stroke in oct 2017 yet ambulates independently. Patient was noted to have an altered gait requiring 1 person assist at 1600\, described as a "right foot drag" per ems. EMS confirmed this upon their arrival.

## 2017-01-25 LAB — URINE CULTURE: SPECIAL REQUESTS: NORMAL

## 2017-01-27 ENCOUNTER — Ambulatory Visit (INDEPENDENT_AMBULATORY_CARE_PROVIDER_SITE_OTHER): Payer: Medicare Other | Admitting: Family Medicine

## 2017-01-27 ENCOUNTER — Encounter: Payer: Self-pay | Admitting: Family Medicine

## 2017-01-27 VITALS — BP 138/64 | HR 73 | Temp 97.8°F | Resp 14 | Ht 62.0 in | Wt 104.7 lb

## 2017-01-27 DIAGNOSIS — F09 Unspecified mental disorder due to known physiological condition: Secondary | ICD-10-CM | POA: Diagnosis not present

## 2017-01-27 DIAGNOSIS — N3000 Acute cystitis without hematuria: Secondary | ICD-10-CM | POA: Diagnosis not present

## 2017-01-27 DIAGNOSIS — I69351 Hemiplegia and hemiparesis following cerebral infarction affecting right dominant side: Secondary | ICD-10-CM | POA: Diagnosis not present

## 2017-01-27 LAB — POCT URINALYSIS DIPSTICK
Bilirubin, UA: NEGATIVE
Blood, UA: NEGATIVE
Glucose, UA: NEGATIVE
KETONES UA: NEGATIVE
Leukocytes, UA: NEGATIVE
Nitrite, UA: NEGATIVE
PH UA: 7 (ref 5.0–8.0)
PROTEIN UA: 30
Spec Grav, UA: 1.01 (ref 1.010–1.025)
Urobilinogen, UA: NEGATIVE E.U./dL — AB

## 2017-01-27 NOTE — Progress Notes (Addendum)
Name: Crystal Faulkner   MRN: [id number]    DOB: 09-Feb-1925   Date:01/27/2017       Progress Note  Subjective  Chief Complaint  Chief Complaint  Patient presents with  . Follow-up    UTI , needs medication that can be crushed; not a capsule    HPI  PT presents for ER follow up with both Daughters (also acting POA's). Family notes pt was diagnosed with UTI and was given Keflex PO QID x10 days. They want to know if they can open the capsules because the patient has been having trouble swallowing pills. Urine culture found multiple organisms and suggested recollect. We will do this today. No history of kidney stones.   They also note that she has been more confused lately - has hx dementia and stroke with verbal deficits and right sided deficits. Pt normally able to say a few words, but has been saying even less since she went into the ER. She has been more intermittently confused lately with family saying that they'll have to tell her more than 1-2 times to do something.  Last follow up with Dr. Manuella Ghazi was in March 2018 and was told she didn't need to return for 6 months. He had recommended a gait belt for family to help patient ambulate and they request Rx for this today.  She is able to ambulate, but holds on to things to steady her gait, does not use cane or walker.  Patient Active Problem List   Diagnosis Date Noted  . Dysarthria due to recent cerebrovascular accident (CVA) 06/23/2016  . Hemiparesis affecting right side as late effect of cerebrovascular accident (CVA) ([place]) 06/23/2016  . Hypotension 06/23/2016  . CKD (chronic kidney disease) stage 5, GFR less than 15 ml/min (HCC) 06/23/2016  . Anemia of chronic disease 06/23/2016  . Encephalopathy, metabolic 77/41/2878  . Intermittent confusion 06/21/2016  . Senile purpura ([place]) 05/13/2016  . Hiatal hernia 05/04/2016  . Thoracic aorta atherosclerosis ([place]) 05/04/2016  . Chronic stable angina ([place]) 10/16/2015  . Secondary  hyperparathyroidism ([place]) 04/13/2015  . Arteriosclerosis of coronary artery 04/08/2015  . Benign hypertension 04/08/2015  . Chronic kidney disease (CKD), stage V ([place]) 04/08/2015  . Controlled gout 04/08/2015  . Dyslipidemia 04/08/2015  . History of peptic ulcer disease 04/08/2015  . Adult hypothyroidism 04/08/2015  . Mild cognitive disorder 04/08/2015  . Anemia of chronic disease 04/08/2015  . Osteoarthrosis involving more than one site but not generalized 04/08/2015  . Osteoporosis 04/08/2015  . Pelvic muscle wasting 04/08/2015  . Allergic rhinitis 04/08/2015  . Tobacco use 04/08/2015  . CCF (congestive cardiac failure) ([place]) 03/29/2014  . HLD (hyperlipidemia) 03/29/2014  . H/O gastrointestinal hemorrhage 04/05/2007    Social History  Substance Use Topics  . Smoking status: Never Smoker  . Smokeless tobacco: Current User    Types: Snuff  . Alcohol use No     Current Outpatient Prescriptions:  .  allopurinol (ZYLOPRIM) 100 MG tablet, Take 1 tablet (100 mg total) by mouth daily., Disp: 90 tablet, Rfl: 2 .  aspirin 81 MG tablet, Take 1 tablet by mouth daily., Disp: , Rfl:  .  cephALEXin (KEFLEX) 250 MG capsule, Take 1 capsule (250 mg total) by mouth 4 (four) times daily., Disp: 40 capsule, Rfl: 0 .  Cholecalciferol (VITAMIN D3) 2000 UNITS capsule, Take 1 capsule by mouth daily., Disp: , Rfl:  .  clopidogrel (PLAVIX) 75 MG tablet, Take 1 tablet (75 mg total) by mouth daily., Disp: 90 tablet,  Rfl: 1 .  ferrous sulfate 325 (65 FE) MG tablet, Take 325 mg by mouth 2 (two) times daily with a meal., Disp: , Rfl:  .  furosemide (LASIX) 40 MG tablet, Take 1 tablet (40 mg total) by mouth daily., Disp: 90 tablet, Rfl: 1 .  isosorbide mononitrate (IMDUR) 60 MG 24 hr tablet, Take 1 tablet (60 mg total) by mouth daily., Disp: 90 tablet, Rfl: 2 .  levothyroxine (SYNTHROID, LEVOTHROID) 75 MCG tablet, Take 1 tablet (75 mcg total) by mouth daily at 6 (six) AM., Disp: 90 tablet, Rfl: 1 .  Multiple  Vitamin (MULTI-VITAMINS) TABS, Take by mouth. Reported on 10/16/2015, Disp: , Rfl:  .  pantoprazole (PROTONIX) 40 MG tablet, Take 1 tablet (40 mg total) by mouth daily., Disp: 90 tablet, Rfl: 1 .  raloxifene (EVISTA) 60 MG tablet, TAKE 1 TABLET BY MOUTH EVERY DAY, Disp: 90 tablet, Rfl: 2 .  rosuvastatin (CRESTOR) 10 MG tablet, TAKE 1 TABLET (10 MG TOTAL) BY MOUTH DAILY., Disp: 90 tablet, Rfl: 2 .  senna-docusate (SENOKOT-S) 8.6-50 MG tablet, Take 1 tablet by mouth at bedtime as needed for mild constipation or moderate constipation., Disp: 30 tablet, Rfl: 5 .  sodium bicarbonate 650 MG tablet, TAKE 2 TABLETS (1,300 MG TOTAL) BY MOUTH TWO (2) TIMES A DAY., Disp: , Rfl: 11  No Known Allergies  ROS  ROS limited by patient's cognitive impairments, patient able to nod head yes/no to each question. Constitutional: Negative for fever or weight change.  Respiratory: Negative for cough and shortness of breath.   Cardiovascular: Negative for chest pain or palpitations.  Gastrointestinal: Negative for abdominal pain, no bowel changes.  GU: Positive for urinary frequency Musculoskeletal: Positive for gait problem, and mild right knee joint swelling (chronic, sees ortho).  Skin: Negative for rash.  Neurological: See HPI No other specific complaints in a complete review of systems (except as listed in HPI above).  Objective  Vitals:   01/27/17 1133  BP: 138/64  Pulse: 73  Resp: 14  Temp: 97.8 F (36.6 C)  TempSrc: Oral  SpO2: 92%  Weight: 104 lb 11.2 oz (47.5 kg)  Height: '5\' 2"'  (1.575 m)    Body mass index is 19.15 kg/m.  Nursing Note and Vital Signs reviewed.  Physical Exam  Constitutional: Patient appears well-developed and well-nourished.  No distress.  HEENT: head atraumatic, normocephalic Cardiovascular: Normal rate, regular rhythm, S1/S2 present.  No murmur or rub heard. No BLE edema. Pulmonary/Chest: Effort normal and breath sounds clear, but diminished due to patient not  following commands for deep inspiration. No respiratory distress or retractions. Abdominal: Soft and non-tender, bowel sounds present x4 quadrants.  No CVA tenderness. Psychiatric: Patient has a normal mood and affect. behavior is normal. Neurological: she is alert and non-verbal, UTA orientation.  Weakness in RLE and RUE. Pt smiles appropriately and nods yes/no appropriately.  She is sitting up on exam table and is able to get down and walk in hallway without assistance. Skin: Skin is warm and dry. No rash noted. No erythema.  MSK: Mild right knee edema, non-pitting, normal popliteal fossa, mild crepitus on lateral aspect of knee with full AROM. Pulses intact. RLE weaker on extension that LLE.   Recent Results (from the past 2160 hour(s))  CBC with Differential     Status: Abnormal   Collection Time: 01/23/17  6:42 PM  Result Value Ref Range   WBC 5.6 3.6 - 11.0 K/uL   RBC 3.26 (L) 3.80 - 5.20 MIL/uL  Hemoglobin 8.3 (L) 12.0 - 16.0 g/dL   HCT 26.8 (L) 35.0 - 47.0 %   MCV 82.1 80.0 - 100.0 fL   MCH 25.5 (L) 26.0 - 34.0 pg   MCHC 31.1 (L) 32.0 - 36.0 g/dL   RDW 16.7 (H) 11.5 - 14.5 %   Platelets 244 150 - 440 K/uL   Neutrophils Relative % 62 %   Neutro Abs 3.5 1.4 - 6.5 K/uL   Lymphocytes Relative 26 %   Lymphs Abs 1.5 1.0 - 3.6 K/uL   Monocytes Relative 8 %   Monocytes Absolute 0.5 0.2 - 0.9 K/uL   Eosinophils Relative 3 %   Eosinophils Absolute 0.1 0 - 0.7 K/uL   Basophils Relative 1 %   Basophils Absolute 0.1 0 - 0.1 K/uL  Comprehensive metabolic panel     Status: Abnormal   Collection Time: 01/23/17  6:42 PM  Result Value Ref Range   Sodium 140 135 - 145 mmol/L   Potassium 4.1 3.5 - 5.1 mmol/L   Chloride 108 101 - 111 mmol/L   CO2 25 22 - 32 mmol/L   Glucose, Bld 108 (H) 65 - 99 mg/dL   BUN 43 (H) 6 - 20 mg/dL   Creatinine, Ser 3.34 (H) 0.44 - 1.00 mg/dL   Calcium 8.7 (L) 8.9 - 10.3 mg/dL   Total Protein 7.1 6.5 - 8.1 g/dL   Albumin 3.7 3.5 - 5.0 g/dL   AST 18 15 - 41  U/L   ALT 11 (L) 14 - 54 U/L   Alkaline Phosphatase 63 38 - 126 U/L   Total Bilirubin 0.3 0.3 - 1.2 mg/dL   GFR calc non Af Amer 11 (L) >60 mL/min   GFR calc Af Amer 13 (L) >60 mL/min    Comment: (NOTE) The eGFR has been calculated using the CKD EPI equation. This calculation has not been validated in all clinical situations. eGFR's persistently <60 mL/min signify possible Chronic Kidney Disease.    Anion gap 7 5 - 15  Troponin I     Status: None   Collection Time: 01/23/17  6:42 PM  Result Value Ref Range   Troponin I <0.03 <0.03 ng/mL  Urinalysis, Complete w Microscopic     Status: Abnormal   Collection Time: 01/23/17  7:35 PM  Result Value Ref Range   Color, Urine STRAW (A) YELLOW   APPearance CLEAR (A) CLEAR   Specific Gravity, Urine 1.006 1.005 - 1.030   pH 7.0 5.0 - 8.0   Glucose, UA NEGATIVE NEGATIVE mg/dL   Hgb urine dipstick NEGATIVE NEGATIVE   Bilirubin Urine NEGATIVE NEGATIVE   Ketones, ur NEGATIVE NEGATIVE mg/dL   Protein, ur NEGATIVE NEGATIVE mg/dL   Nitrite NEGATIVE NEGATIVE   Leukocytes, UA MODERATE (A) NEGATIVE   RBC / HPF 0-5 0 - 5 RBC/hpf   WBC, UA 6-30 0 - 5 WBC/hpf   Bacteria, UA MANY (A) NONE SEEN   Squamous Epithelial / LPF 0-5 (A) NONE SEEN  Urine culture     Status: Abnormal   Collection Time: 01/23/17  7:35 PM  Result Value Ref Range   Specimen Description URINE, CLEAN CATCH    Special Requests Normal    Culture MULTIPLE SPECIES PRESENT, SUGGEST RECOLLECTION (A)    Report Status 01/25/2017 FINAL   POCT urinalysis dipstick     Status: Abnormal   Collection Time: 01/27/17 11:46 AM  Result Value Ref Range   Color, UA yellow    Clarity, UA clear    Glucose,  UA negative    Bilirubin, UA negative    Ketones, UA negative    Spec Grav, UA 1.010 1.010 - 1.025   Blood, UA negative    pH, UA 7.0 5.0 - 8.0   Protein, UA 30    Urobilinogen, UA negative (A) 0.2 or 1.0 E.U./dL   Nitrite, UA negative    Leukocytes, UA Negative Negative      Assessment & Plan  1. Acute cystitis without hematuria - POCT urinalysis dipstick - Results is negative for cystitis, advised this may be due to antibiotic use, but we will send for repeat culture to be sure. - Urine Culture - Advised that patient may take granules from capsules in applesauce to take medication.   2. Mild cognitive disorder - Schedule appointment with Dr. Manuella Ghazi, Neurologist, when possible to assess progression of cognitive dysfunction.  3. Hemiparesis affecting right side as late effect of cerebrovascular accident (CVA) ([place]) - Discussed option of re-ordering home PT to work on patient's gait, but family declines and states they want to work with the gait belt first.   -Red flags and when to present for emergency care or RTC including fever >101.26F, chest pain, shortness of breath, new/worsening/un-resolving symptoms, significant change in mental status reviewed with patient at time of visit. Follow up and care instructions discussed and provided in AVS.  I have reviewed this encounter including the documentation in this note and/or discussed this patient with the Johney Maine, FNP, NP-C. I am certifying that I agree with the content of this note as supervising physician.  Steele Sizer, MD [facility] 02/01/2017, 8:24 PM

## 2017-01-27 NOTE — Patient Instructions (Addendum)
Please schedule an appointment with Dr. Manuella Ghazi, neurologist due to the change in mental function recently. We will call you with the results of the Urine Culture. You may open the Cephalexin Capsules and mix the sprinkles with small amount of applesauce to take the medication.

## 2017-01-28 LAB — URINE CULTURE: Organism ID, Bacteria: NO GROWTH

## 2017-01-29 ENCOUNTER — Telehealth: Payer: Self-pay | Admitting: Family Medicine

## 2017-01-29 NOTE — Progress Notes (Signed)
Please call and let patient's daughters know that her urine culture was negative. They should finish out the antibiotic they were given, and no new medications are needed. Please also ask if they were able to get in with the Neurologist. Thank you!

## 2017-01-29 NOTE — Telephone Encounter (Signed)
Erroneous entry

## 2017-01-30 ENCOUNTER — Other Ambulatory Visit: Payer: Self-pay | Admitting: Family Medicine

## 2017-01-30 ENCOUNTER — Encounter: Payer: Self-pay | Admitting: Family Medicine

## 2017-01-30 DIAGNOSIS — N185 Chronic kidney disease, stage 5: Secondary | ICD-10-CM

## 2017-01-30 DIAGNOSIS — I208 Other forms of angina pectoris: Secondary | ICD-10-CM

## 2017-01-30 DIAGNOSIS — E039 Hypothyroidism, unspecified: Secondary | ICD-10-CM

## 2017-01-30 DIAGNOSIS — E038 Other specified hypothyroidism: Secondary | ICD-10-CM

## 2017-01-30 NOTE — Telephone Encounter (Signed)
Patient requesting refill of Lasix and Levothyroxine to [facility].

## 2017-02-06 DIAGNOSIS — S86912A Strain of unspecified muscle(s) and tendon(s) at lower leg level, left leg, initial encounter: Secondary | ICD-10-CM | POA: Diagnosis not present

## 2017-02-06 DIAGNOSIS — M1712 Unilateral primary osteoarthritis, left knee: Secondary | ICD-10-CM | POA: Diagnosis not present

## 2017-02-06 DIAGNOSIS — M7732 Calcaneal spur, left foot: Secondary | ICD-10-CM | POA: Diagnosis not present

## 2017-02-09 ENCOUNTER — Telehealth: Payer: Self-pay

## 2017-02-09 ENCOUNTER — Other Ambulatory Visit: Payer: Self-pay

## 2017-02-09 DIAGNOSIS — R296 Repeated falls: Secondary | ICD-10-CM

## 2017-02-09 NOTE — Progress Notes (Unsigned)
PT Orders signed.  Please let me know if there is anything else needed. Thank you.

## 2017-02-09 NOTE — Telephone Encounter (Signed)
Order has been sent

## 2017-02-09 NOTE — Telephone Encounter (Signed)
Crystal Faulkner advised family that patient needed to start back doing Physical therapy. Daughter would like patient to have Alvis Lemmings come back out and work with her. Please put referral in. Contact phone number would be [name] her grand daughter at [phone] and the other numbers are [phone] and [phone]. Thanks

## 2017-02-11 DIAGNOSIS — N390 Urinary tract infection, site not specified: Secondary | ICD-10-CM | POA: Diagnosis not present

## 2017-02-11 DIAGNOSIS — R4189 Other symptoms and signs involving cognitive functions and awareness: Secondary | ICD-10-CM | POA: Diagnosis not present

## 2017-02-14 DIAGNOSIS — N390 Urinary tract infection, site not specified: Secondary | ICD-10-CM | POA: Diagnosis not present

## 2017-02-14 DIAGNOSIS — R4189 Other symptoms and signs involving cognitive functions and awareness: Secondary | ICD-10-CM | POA: Diagnosis not present

## 2017-02-18 DIAGNOSIS — N390 Urinary tract infection, site not specified: Secondary | ICD-10-CM | POA: Diagnosis not present

## 2017-02-18 DIAGNOSIS — R4189 Other symptoms and signs involving cognitive functions and awareness: Secondary | ICD-10-CM | POA: Diagnosis not present

## 2017-02-19 DIAGNOSIS — R32 Unspecified urinary incontinence: Secondary | ICD-10-CM | POA: Diagnosis not present

## 2017-02-23 DIAGNOSIS — R4189 Other symptoms and signs involving cognitive functions and awareness: Secondary | ICD-10-CM | POA: Diagnosis not present

## 2017-02-23 DIAGNOSIS — N390 Urinary tract infection, site not specified: Secondary | ICD-10-CM | POA: Diagnosis not present

## 2017-02-26 ENCOUNTER — Observation Stay
Admission: EM | Admit: 2017-02-26 | Discharge: 2017-02-28 | Disposition: A | Discharge status: Home / Self Care | Payer: Medicare Other | Attending: Internal Medicine | Admitting: Internal Medicine

## 2017-02-26 ENCOUNTER — Emergency Department: Payer: Medicare Other

## 2017-02-26 ENCOUNTER — Encounter: Payer: Self-pay | Admitting: Emergency Medicine

## 2017-02-26 ENCOUNTER — Other Ambulatory Visit: Payer: Self-pay

## 2017-02-26 DIAGNOSIS — N185 Chronic kidney disease, stage 5: Secondary | ICD-10-CM | POA: Diagnosis not present

## 2017-02-26 DIAGNOSIS — R61 Generalized hyperhidrosis: Secondary | ICD-10-CM | POA: Insufficient documentation

## 2017-02-26 DIAGNOSIS — R1311 Dysphagia, oral phase: Secondary | ICD-10-CM | POA: Diagnosis not present

## 2017-02-26 DIAGNOSIS — R531 Weakness: Secondary | ICD-10-CM | POA: Diagnosis not present

## 2017-02-26 DIAGNOSIS — F0280 Dementia in other diseases classified elsewhere without behavioral disturbance: Secondary | ICD-10-CM | POA: Diagnosis not present

## 2017-02-26 DIAGNOSIS — I252 Old myocardial infarction: Secondary | ICD-10-CM | POA: Diagnosis not present

## 2017-02-26 DIAGNOSIS — I2510 Atherosclerotic heart disease of native coronary artery without angina pectoris: Secondary | ICD-10-CM | POA: Insufficient documentation

## 2017-02-26 DIAGNOSIS — R55 Syncope and collapse: Secondary | ICD-10-CM | POA: Diagnosis not present

## 2017-02-26 DIAGNOSIS — Z79899 Other long term (current) drug therapy: Secondary | ICD-10-CM | POA: Insufficient documentation

## 2017-02-26 DIAGNOSIS — G9340 Encephalopathy, unspecified: Secondary | ICD-10-CM | POA: Diagnosis not present

## 2017-02-26 DIAGNOSIS — I132 Hypertensive heart and chronic kidney disease with heart failure and with stage 5 chronic kidney disease, or end stage renal disease: Secondary | ICD-10-CM | POA: Diagnosis not present

## 2017-02-26 DIAGNOSIS — Z66 Do not resuscitate: Secondary | ICD-10-CM | POA: Insufficient documentation

## 2017-02-26 DIAGNOSIS — D638 Anemia in other chronic diseases classified elsewhere: Secondary | ICD-10-CM | POA: Insufficient documentation

## 2017-02-26 DIAGNOSIS — J9811 Atelectasis: Secondary | ICD-10-CM | POA: Diagnosis not present

## 2017-02-26 DIAGNOSIS — G309 Alzheimer's disease, unspecified: Secondary | ICD-10-CM | POA: Insufficient documentation

## 2017-02-26 DIAGNOSIS — Z7902 Long term (current) use of antithrombotics/antiplatelets: Secondary | ICD-10-CM | POA: Insufficient documentation

## 2017-02-26 DIAGNOSIS — K449 Diaphragmatic hernia without obstruction or gangrene: Secondary | ICD-10-CM | POA: Insufficient documentation

## 2017-02-26 DIAGNOSIS — I208 Other forms of angina pectoris: Secondary | ICD-10-CM

## 2017-02-26 DIAGNOSIS — R4182 Altered mental status, unspecified: Secondary | ICD-10-CM | POA: Diagnosis present

## 2017-02-26 DIAGNOSIS — E039 Hypothyroidism, unspecified: Secondary | ICD-10-CM | POA: Diagnosis not present

## 2017-02-26 DIAGNOSIS — I69351 Hemiplegia and hemiparesis following cerebral infarction affecting right dominant side: Secondary | ICD-10-CM | POA: Diagnosis not present

## 2017-02-26 DIAGNOSIS — Z8711 Personal history of peptic ulcer disease: Secondary | ICD-10-CM | POA: Insufficient documentation

## 2017-02-26 DIAGNOSIS — Z7982 Long term (current) use of aspirin: Secondary | ICD-10-CM | POA: Insufficient documentation

## 2017-02-26 DIAGNOSIS — R404 Transient alteration of awareness: Secondary | ICD-10-CM | POA: Diagnosis not present

## 2017-02-26 DIAGNOSIS — I509 Heart failure, unspecified: Secondary | ICD-10-CM | POA: Insufficient documentation

## 2017-02-26 DIAGNOSIS — I739 Peripheral vascular disease, unspecified: Secondary | ICD-10-CM | POA: Insufficient documentation

## 2017-02-26 DIAGNOSIS — I639 Cerebral infarction, unspecified: Secondary | ICD-10-CM | POA: Diagnosis not present

## 2017-02-26 DIAGNOSIS — I10 Essential (primary) hypertension: Secondary | ICD-10-CM | POA: Diagnosis not present

## 2017-02-26 DIAGNOSIS — E440 Moderate protein-calorie malnutrition: Secondary | ICD-10-CM | POA: Insufficient documentation

## 2017-02-26 LAB — CBC WITH DIFFERENTIAL/PLATELET
Basophils Absolute: 0.1 10*3/uL (ref 0–0.1)
Basophils Relative: 1 %
EOS ABS: 0.1 10*3/uL (ref 0–0.7)
Eosinophils Relative: 2 %
HCT: 24.6 % — ABNORMAL LOW (ref 35.0–47.0)
HEMOGLOBIN: 7.7 g/dL — AB (ref 12.0–16.0)
LYMPHS ABS: 1.5 10*3/uL (ref 1.0–3.6)
LYMPHS PCT: 23 %
MCH: 24.9 pg — AB (ref 26.0–34.0)
MCHC: 31.1 g/dL — ABNORMAL LOW (ref 32.0–36.0)
MCV: 80 fL (ref 80.0–100.0)
Monocytes Absolute: 0.4 10*3/uL (ref 0.2–0.9)
Monocytes Relative: 7 %
NEUTROS PCT: 67 %
Neutro Abs: 4.4 10*3/uL (ref 1.4–6.5)
PLATELETS: 268 10*3/uL (ref 150–440)
RBC: 3.08 MIL/uL — AB (ref 3.80–5.20)
RDW: 17 % — ABNORMAL HIGH (ref 11.5–14.5)
WBC: 6.5 10*3/uL (ref 3.6–11.0)

## 2017-02-26 LAB — URINALYSIS, COMPLETE (UACMP) WITH MICROSCOPIC
Bacteria, UA: NONE SEEN
Bilirubin Urine: NEGATIVE
GLUCOSE, UA: NEGATIVE mg/dL
HGB URINE DIPSTICK: NEGATIVE
Ketones, ur: NEGATIVE mg/dL
Leukocytes, UA: NEGATIVE
NITRITE: NEGATIVE
PROTEIN: 30 mg/dL — AB
Specific Gravity, Urine: 1.009 (ref 1.005–1.030)
pH: 7 (ref 5.0–8.0)

## 2017-02-26 LAB — COMPREHENSIVE METABOLIC PANEL
ALT: 12 U/L — AB (ref 14–54)
AST: 25 U/L (ref 15–41)
Albumin: 3.2 g/dL — ABNORMAL LOW (ref 3.5–5.0)
Alkaline Phosphatase: 59 U/L (ref 38–126)
Anion gap: 9 (ref 5–15)
BUN: 52 mg/dL — AB (ref 6–20)
CHLORIDE: 108 mmol/L (ref 101–111)
CO2: 23 mmol/L (ref 22–32)
CREATININE: 3.34 mg/dL — AB (ref 0.44–1.00)
Calcium: 8.7 mg/dL — ABNORMAL LOW (ref 8.9–10.3)
GFR calc Af Amer: 13 mL/min — ABNORMAL LOW (ref >60)
GFR calc non Af Amer: 11 mL/min — ABNORMAL LOW (ref >60)
Glucose, Bld: 117 mg/dL — ABNORMAL HIGH (ref 65–99)
Potassium: 3.9 mmol/L (ref 3.5–5.1)
SODIUM: 140 mmol/L (ref 135–145)
Total Bilirubin: 0.4 mg/dL (ref 0.3–1.2)
Total Protein: 6.7 g/dL (ref 6.5–8.1)

## 2017-02-26 LAB — IRON AND TIBC
Iron: 15 ug/dL — ABNORMAL LOW (ref 28–170)
SATURATION RATIOS: 4 % — AB (ref 10.4–31.8)
TIBC: 372 ug/dL (ref 250–450)
UIBC: 357 ug/dL

## 2017-02-26 LAB — TROPONIN I
Troponin I: <0.03 ng/mL (ref <0.03)
Troponin I: <0.03 ng/mL (ref <0.03)

## 2017-02-26 LAB — LACTIC ACID, PLASMA
LACTIC ACID, VENOUS: 2.1 mmol/L — AB (ref 0.5–1.9)
Lactic Acid, Venous: 1.8 mmol/L (ref 0.5–1.9)

## 2017-02-26 LAB — FERRITIN: Ferritin: 6 ng/mL — ABNORMAL LOW (ref 11–307)

## 2017-02-26 LAB — BRAIN NATRIURETIC PEPTIDE: B Natriuretic Peptide: 99 pg/mL (ref 0.0–100.0)

## 2017-02-26 MED ORDER — ACETAMINOPHEN 325 MG PO TABS: 650.0000 mg | ORAL_TABLET | Freq: Four times a day (QID) | ORAL | Status: DC | PRN

## 2017-02-26 MED ORDER — LEVOTHYROXINE SODIUM 50 MCG PO TABS
75.0000 ug | ORAL_TABLET | Freq: Every day | ORAL | Status: DC
  Administered 2017-02-28: 75 ug via ORAL
  Filled 2017-02-26: qty 1

## 2017-02-26 MED ORDER — CLOPIDOGREL BISULFATE 75 MG PO TABS
75.0000 mg | ORAL_TABLET | Freq: Every day | ORAL | Status: DC
  Administered 2017-02-26 – 2017-02-28 (×3): 75 mg via ORAL
  Filled 2017-02-26 (×3): qty 1

## 2017-02-26 MED ORDER — ROSUVASTATIN CALCIUM 10 MG PO TABS
10.0000 mg | ORAL_TABLET | Freq: Every day | ORAL | Status: DC
  Administered 2017-02-26 – 2017-02-28 (×3): 10 mg via ORAL
  Filled 2017-02-26 (×3): qty 1

## 2017-02-26 MED ORDER — ACETAMINOPHEN 650 MG RE SUPP: 650.0000 mg | Freq: Four times a day (QID) | RECTAL | Status: DC | PRN

## 2017-02-26 MED ORDER — CEPHALEXIN 250 MG/5ML PO SUSR
250.0000 mg | Freq: Every day | ORAL | Status: DC
  Administered 2017-02-26 – 2017-02-28 (×3): 250 mg via ORAL
  Filled 2017-02-26: qty 10
  Filled 2017-02-26 (×2): qty 5

## 2017-02-26 MED ORDER — SENNOSIDES-DOCUSATE SODIUM 8.6-50 MG PO TABS: 1.0000 | ORAL_TABLET | Freq: Every evening | ORAL | Status: DC | PRN

## 2017-02-26 MED ORDER — PANTOPRAZOLE SODIUM 40 MG PO TBEC
40.0000 mg | DELAYED_RELEASE_TABLET | Freq: Every day | ORAL | Status: DC
  Administered 2017-02-26 – 2017-02-28 (×3): 40 mg via ORAL
  Filled 2017-02-26 (×3): qty 1

## 2017-02-26 MED ORDER — RALOXIFENE HCL 60 MG PO TABS
60.0000 mg | ORAL_TABLET | Freq: Every day | ORAL | Status: DC
  Administered 2017-02-26 – 2017-02-28 (×3): 60 mg via ORAL
  Filled 2017-02-26 (×3): qty 1

## 2017-02-26 MED ORDER — ONDANSETRON HCL 4 MG PO TABS: 4.0000 mg | ORAL_TABLET | Freq: Four times a day (QID) | ORAL | Status: DC | PRN

## 2017-02-26 MED ORDER — FERROUS SULFATE 325 (65 FE) MG PO TABS
325.0000 mg | ORAL_TABLET | Freq: Two times a day (BID) | ORAL | Status: DC
  Administered 2017-02-27 – 2017-02-28 (×3): 325 mg via ORAL
  Filled 2017-02-26 (×4): qty 1

## 2017-02-26 MED ORDER — ALLOPURINOL 100 MG PO TABS
100.0000 mg | ORAL_TABLET | Freq: Every day | ORAL | Status: DC
  Administered 2017-02-26 – 2017-02-28 (×3): 100 mg via ORAL
  Filled 2017-02-26 (×3): qty 1

## 2017-02-26 MED ORDER — FUROSEMIDE 40 MG PO TABS
40.0000 mg | ORAL_TABLET | Freq: Every day | ORAL | Status: DC
  Administered 2017-02-26: 40 mg via ORAL
  Filled 2017-02-26: qty 1

## 2017-02-26 MED ORDER — SODIUM CHLORIDE 0.9 % IV SOLN: INTRAVENOUS | Status: DC

## 2017-02-26 MED ORDER — ISOSORBIDE MONONITRATE ER 30 MG PO TB24
60.0000 mg | ORAL_TABLET | Freq: Every day | ORAL | Status: DC
  Administered 2017-02-26: 60 mg via ORAL
  Filled 2017-02-26 (×2): qty 2

## 2017-02-26 MED ORDER — VITAMIN D 1000 UNITS PO TABS
2000.0000 [IU] | ORAL_TABLET | Freq: Every day | ORAL | Status: DC
  Administered 2017-02-26 – 2017-02-28 (×3): 2000 [IU] via ORAL
  Filled 2017-02-26 (×3): qty 2

## 2017-02-26 MED ORDER — ONDANSETRON HCL 4 MG/2ML IJ SOLN: 4.0000 mg | Freq: Four times a day (QID) | INTRAMUSCULAR | Status: DC | PRN

## 2017-02-26 MED ORDER — SODIUM BICARBONATE 650 MG PO TABS
650.0000 mg | ORAL_TABLET | Freq: Three times a day (TID) | ORAL | Status: DC
  Administered 2017-02-26 – 2017-02-28 (×5): 650 mg via ORAL
  Filled 2017-02-26 (×7): qty 1

## 2017-02-26 NOTE — ED Triage Notes (Signed)
Patient from home via [facility]. Family states patient has been more lethargic than normal today with episodes of diaphoresis. Patient is currently on antibiotic for UTI. Patient denies pain but agrees that she doesn't feel well. Patient is non-verbal with right sided weakness due to previous stroke. Patient oriented at baseline upon arrival.

## 2017-02-26 NOTE — H&P (Signed)
[facility] [place] NAME: Crystal Faulkner    MR#:  [id number]  DATE OF BIRTH:  1924-12-03  DATE OF ADMISSION:  02/26/2017  PRIMARY CARE PHYSICIAN: Steele Sizer, MD   REQUESTING/REFERRING PHYSICIAN:   CHIEF COMPLAINT:   Chief Complaint  Patient presents with  . Weakness    HISTORY OF PRESENT ILLNESS: Crystal Faulkner  is a 83 y.o. female with a known history of Alzheimer's disease, chronic anemia, chronic kidney disease, coronary artery disease who is brought in by family due to decrease in responsiveness. Patient had a similar episode last month. Patient's daughter states that earlier today patient became very diaphoretic. And kind of slumped over. Then she had this episode to 3 times. Patient is nonverbal due to previous CVA. In the emergency room Had a CT scan of the head which shows unchanged her anemia is getting worse. According the daughter patient was making gurgling sounds.  PAST MEDICAL HISTORY:   Past Medical History:  Diagnosis Date  . Allergy   . Alzheimer's disease   . Anemia   . CCF (congestive cardiac failure) ([place])   . Chronic kidney disease   . Chronic stable angina ([place])   . Gout   . H/O: GI bleed   . Hypercholesteremia   . Hyperglycemia   . Hyperlipidemia   . Hypertension   . Incontinence   . Myocardial infarction ([place])   . Osteoporosis   . Stroke ([place])   . Thyroid disease   . Vitamin D deficiency     PAST SURGICAL HISTORY: Past Surgical History:  Procedure Laterality Date  . EXPLORATORY LAPAROTOMY    . EYE SURGERY Bilateral    cataract    SOCIAL HISTORY:  Social History  Substance Use Topics  . Smoking status: Never Smoker  . Smokeless tobacco: Current User    Types: Snuff  . Alcohol use No    FAMILY HISTORY:  Family History  Problem Relation Age of Onset  . Diabetes Sister   . Hypertension Sister     DRUG ALLERGIES: No Known Allergies  REVIEW OF SYSTEMS:   CONSTITUTIONAL: Unable to  provide  MEDICATIONS AT HOME:  Prior to Admission medications   Medication Sig Start Date End Date Taking? Authorizing Provider  allopurinol (ZYLOPRIM) 100 MG tablet Take 1 tablet (100 mg total) by mouth daily. 05/13/16  Yes Sowles, Drue Stager, MD  cephALEXin (KEFLEX) 125 MG/5ML suspension Take 10 mLs by mouth daily. 02/20/17 03/02/17 Yes [provider]  Cholecalciferol (VITAMIN D3) 2000 UNITS capsule Take 1 capsule by mouth daily.   Yes [provider]  clopidogrel (PLAVIX) 75 MG tablet Take 1 tablet (75 mg total) by mouth daily. 11/10/16  Yes Sowles, Drue Stager, MD  ferrous sulfate 325 (65 FE) MG tablet Take 325 mg by mouth 2 (two) times daily with a meal.   Yes [provider]  furosemide (LASIX) 40 MG tablet TAKE 1 TABLET (40 MG TOTAL) BY MOUTH DAILY. 01/30/17  Yes Hubbard Hartshorn, FNP  isosorbide mononitrate (IMDUR) 60 MG 24 hr tablet Take 1 tablet (60 mg total) by mouth daily. 05/13/16  Yes Sowles, Drue Stager, MD  levothyroxine (SYNTHROID, LEVOTHROID) 75 MCG tablet TAKE 1 TABLET (75 MCG TOTAL) BY MOUTH DAILY AT 6 (SIX) AM. 01/30/17  Yes Hubbard Hartshorn, FNP  pantoprazole (PROTONIX) 40 MG tablet Take 1 tablet (40 mg total) by mouth daily. 08/23/16  Yes Sowles, Drue Stager, MD  raloxifene (EVISTA) 60 MG tablet TAKE 1 TABLET BY MOUTH  EVERY DAY 09/04/16  Yes Sowles, Drue Stager, MD  rosuvastatin (CRESTOR) 10 MG tablet TAKE 1 TABLET (10 MG TOTAL) BY MOUTH DAILY. 07/10/16  Yes Sowles, Drue Stager, MD  senna-docusate (SENOKOT-S) 8.6-50 MG tablet Take 1 tablet by mouth at bedtime as needed for mild constipation or moderate constipation. 06/23/16  Yes Theodoro Grist, MD  sodium bicarbonate 650 MG tablet TAKE 2 TABLETS (1,300 MG TOTAL) BY MOUTH TWO (2) TIMES A DAY. 03/19/15  Yes [provider]      PHYSICAL EXAMINATION:   VITAL SIGNS: Blood pressure (!) 143/69, pulse (!) 51, temperature (!) 97.5 F (36.4 C), temperature source Oral, resp. rate 15, weight 110 lb 3.7 oz (50 kg), SpO2 (!) 77  %.  GENERAL:  83 y.o.-year-old patient lying in the bed with no acute distress.  EYES: Pupils equal, round, reactive to light and accommodation. No scleral icterus. Extraocular muscles intact.  HEENT: Head atraumatic, normocephalic. Oropharynx and nasopharynx clear.  NECK:  Supple, no jugular venous distention. No thyroid enlargement, no tenderness.  LUNGS: Normal breath sounds bilaterally, no wheezing, rales,rhonchi or crepitation. No use of accessory muscles of respiration.  CARDIOVASCULAR: S1, S2 normal. No murmurs, rubs, or gallops.  ABDOMEN: Soft, nontender, nondistended. Bowel sounds present. No organomegaly or mass.  EXTREMITIES: No pedal edema, cyanosis, or clubbing.  NEUROLOGIC: Cranial nerves II through XII are intact. Muscle strength 5/5 in all extremities. Sensation intact. Gait not checked.  PSYCHIATRIC: The patient is alert unable to check oriented  SKIN: No obvious rash, lesion, or ulcer.   LABORATORY PANEL:   CBC  Recent Labs Lab 02/26/17 1640  WBC 6.5  HGB 7.7*  HCT 24.6*  PLT 268  MCV 80.0  MCH 24.9*  MCHC 31.1*  RDW 17.0*  LYMPHSABS 1.5  MONOABS 0.4  EOSABS 0.1  BASOSABS 0.1   ------------------------------------------------------------------------------------------------------------------  Chemistries   Recent Labs Lab 02/26/17 1640  NA 140  K 3.9  CL 108  CO2 23  GLUCOSE 117*  BUN 52*  CREATININE 3.34*  CALCIUM 8.7*  AST 25  ALT 12*  ALKPHOS 59  BILITOT 0.4   ------------------------------------------------------------------------------------------------------------------ estimated creatinine clearance is 8.8 mL/min (A) (by C-G formula based on SCr of 3.34 mg/dL (H)). ------------------------------------------------------------------------------------------------------------------ No results for input(s): TSH, T4TOTAL, T3FREE, THYROIDAB in the last 72 hours.  Invalid input(s): FREET3   Coagulation profile No results for input(s):  INR, PROTIME in the last 168 hours. ------------------------------------------------------------------------------------------------------------------- No results for input(s): DDIMER in the last 72 hours. -------------------------------------------------------------------------------------------------------------------  Cardiac Enzymes  Recent Labs Lab 02/26/17 1640  TROPONINI <0.03   ------------------------------------------------------------------------------------------------------------------ Invalid input(s): POCBNP  ---------------------------------------------------------------------------------------------------------------  Urinalysis    Component Value Date/Time   COLORURINE STRAW (A) 02/26/2017 1820   APPEARANCEUR CLEAR (A) 02/26/2017 1820   LABSPEC 1.009 02/26/2017 1820   PHURINE 7.0 02/26/2017 1820   GLUCOSEU NEGATIVE 02/26/2017 1820   HGBUR NEGATIVE 02/26/2017 1820   BILIRUBINUR NEGATIVE 02/26/2017 1820   BILIRUBINUR negative 01/27/2017 [place] 02/26/2017 1820   PROTEINUR 30 (A) 02/26/2017 1820   UROBILINOGEN negative (A) 01/27/2017 1146   NITRITE NEGATIVE 02/26/2017 1820   LEUKOCYTESUR NEGATIVE 02/26/2017 1820     RADIOLOGY: Dg Chest 2 View  Result Date: 02/26/2017 CLINICAL DATA:  Altered mental status. EXAM: CHEST  2 VIEW COMPARISON:  06/22/2016. FINDINGS: Mediastinum and hilar structures normal. Heart size normal. Mild left base subsegmental atelectasis. Stable left base pleural thickening consistent scarring. No pneumothorax. Large sliding hiatal hernia. Metallic shrapnel noted over the chest and neck. IMPRESSION: 1. Mild left  base subsegmental atelectasis. Mild left base pleural scarring. 2. Large sliding hiatal hernia. Electronically Signed   By: Marcello Moores  Register   On: 02/26/2017 17:02   Ct Head Wo Contrast  Result Date: 02/26/2017 CLINICAL DATA:  Increased lethargy. EXAM: CT HEAD WITHOUT CONTRAST TECHNIQUE: Contiguous axial images were  obtained from the base of the skull through the vertex without intravenous contrast. COMPARISON:  01/23/2017 FINDINGS: Brain: No evidence of acute infarction, hemorrhage, hydrocephalus, extra-axial collection or mass lesion/mass effect. Area of hypoattenuation in the left MCA territory is stable. There is microangiopathy and moderate brain parenchymal volume loss. Vascular: Vascular calcifications at the skullbase. Skull: Normal. Negative for fracture or focal lesion. Sinuses/Orbits: No acute finding. Other: None. IMPRESSION: No acute intracranial abnormality. Stable appearance of left MCA territory chronic infarct. Atrophy, chronic microvascular disease. Electronically Signed   By: Fidela Salisbury M.D.   On: 02/26/2017 17:10    EKG: Orders placed or performed during the hospital encounter of 02/26/17  . ED EKG  . ED EKG    IMPRESSION AND PLAN: Patient is a 83 year old female presenting with episode decrease in responsiveness   1. Decrease in her responsiveness etiology unclear I will monitor on telemetry Obtain echocardiogram of the heart I will also ask speech to see due to patient having some breathing troubles and congestion  2. Anemia which appears to be chronic but worsening likely due to anemia of chronic disease We'll check iron and ferritin B12 and folate Guaiac stools If hemoglobin drops below 7 transfuse  3. Chronic kidney disease stage IV worsening patient does not want dialysis  4. Previous history of CVA continue Plavix  5. Coronary artery disease continue Plavix and Imdur  6. Hypothyroidism continue Synthroid  7. Miscellaneous due to having worsening anemia I will hold off on Lovenox and SCDs for now         All the records are reviewed and case discussed with ED provider. Management plans discussed with the patient, family and they are in agreement.  CODE STATUS: Code Status History    Date Active Date Inactive Code Status Order ID Comments User  Context   06/22/2016  1:25 AM 06/23/2016  6:25 PM Full Code [id number]  Hugelmeyer, Ubaldo Glassing, DO Inpatient    Advance Directive Documentation     Most Recent Value  Type of Advance Directive  Healthcare Power of Attorney  Pre-existing out of facility DNR order (yellow form or pink MOST form)  -  "MOST" Form in Place?  -       TOTAL TIME TAKING CARE OF THIS PATIENT: 55 minutes.    Dustin Flock M.D on 02/26/2017 at 7:33 PM  Between 7am to 6pm - Pager - [phone]  After 6pm go to [email or web address] - password [email or web address] [facility]  [facility]  Office  [phone]  CC: Primary care physician; Steele Sizer, MD

## 2017-02-26 NOTE — Plan of Care (Signed)
Problem: Education: Goal: Knowledge of [facility] Education information/materials will improve Outcome: Progressing Pt is non-verbal  Pt likes to be called Crystal Faulkner  PAST MEDICAL HISTORY:   Past Medical History:  Diagnosis Date  . Allergy   . Alzheimer's disease   . Anemia   . CCF (congestive cardiac failure) ([place])   . Chronic kidney disease   . Chronic stable angina ([place])   . Gout   . H/O: GI bleed   . Hypercholesteremia   . Hyperglycemia   . Hyperlipidemia   . Hypertension   . Incontinence   . Myocardial infarction ([place])   . Osteoporosis   . Stroke ([place])   . Thyroid disease   . Vitamin D deficiency    Pt is well controlled with home medications

## 2017-02-26 NOTE — ED Provider Notes (Signed)
[facility] Emergency Department Provider Note   ____________________________________________   First MD Initiated Contact with Patient 02/26/17 1660     (approximate)  I have reviewed the triage vital signs and the nursing notes.   HISTORY  Chief Complaint Weakness History reviewed limited by nonverbal patient   HPI Crystal Faulkner is a 83 y.o. female who is nonverbal due to a stroke. She is currently on an antibiotic for UTI. Patient's family reports that she's been having episodes of sweatiness and seems more lethargic today. Patient does communicate by nodding or shaking her head yes or no and does indicate that she really doesn't feel well today I understand that she has a cough and she does have some crackles in the bases of her lungs. I am unable to get any other history.   Past Medical History:  Diagnosis Date  . Allergy   . Alzheimer's disease   . Anemia   . CCF (congestive cardiac failure) ([place])   . Chronic kidney disease   . Chronic stable angina ([place])   . Gout   . H/O: GI bleed   . Hypercholesteremia   . Hyperglycemia   . Hyperlipidemia   . Hypertension   . Incontinence   . Myocardial infarction ([place])   . Osteoporosis   . Stroke ([place])   . Thyroid disease   . Vitamin D deficiency     Patient Active Problem List   Diagnosis Date Noted  . Dysarthria due to recent cerebrovascular accident (CVA) 06/23/2016  . Hemiparesis affecting right side as late effect of cerebrovascular accident (CVA) ([place]) 06/23/2016  . Hypotension 06/23/2016  . CKD (chronic kidney disease) stage 5, GFR less than 15 ml/min (HCC) 06/23/2016  . Anemia of chronic disease 06/23/2016  . Encephalopathy, metabolic 63/08/6008  . Intermittent confusion 06/21/2016  . Senile purpura ([place]) 05/13/2016  . Hiatal hernia 05/04/2016  . Thoracic aorta atherosclerosis ([place]) 05/04/2016  . Chronic stable angina ([place]) 10/16/2015  . Secondary hyperparathyroidism ([place])  04/13/2015  . Arteriosclerosis of coronary artery 04/08/2015  . Benign hypertension 04/08/2015  . Chronic kidney disease (CKD), stage V ([place]) 04/08/2015  . Controlled gout 04/08/2015  . Dyslipidemia 04/08/2015  . History of peptic ulcer disease 04/08/2015  . Adult hypothyroidism 04/08/2015  . Mild cognitive disorder 04/08/2015  . Anemia of chronic disease 04/08/2015  . Osteoarthrosis involving more than one site but not generalized 04/08/2015  . Osteoporosis 04/08/2015  . Pelvic muscle wasting 04/08/2015  . Allergic rhinitis 04/08/2015  . Tobacco use 04/08/2015  . CCF (congestive cardiac failure) ([place]) 03/29/2014  . HLD (hyperlipidemia) 03/29/2014  . H/O gastrointestinal hemorrhage 04/05/2007    Past Surgical History:  Procedure Laterality Date  . EXPLORATORY LAPAROTOMY    . EYE SURGERY Bilateral    cataract    Prior to Admission medications   Medication Sig Start Date End Date Taking? Authorizing Provider  allopurinol (ZYLOPRIM) 100 MG tablet Take 1 tablet (100 mg total) by mouth daily. 05/13/16   Steele Sizer, MD  aspirin 81 MG tablet Take 1 tablet by mouth daily. 12/30/06   [provider]  Cholecalciferol (VITAMIN D3) 2000 UNITS capsule Take 1 capsule by mouth daily.    [provider]  clopidogrel (PLAVIX) 75 MG tablet Take 1 tablet (75 mg total) by mouth daily. 11/10/16   Steele Sizer, MD  ferrous sulfate 325 (65 FE) MG tablet Take 325 mg by mouth 2 (two) times daily with a meal.    [provider]  furosemide (LASIX) 40 MG tablet TAKE 1 TABLET (40 MG TOTAL) BY MOUTH DAILY. 01/30/17   Hubbard Hartshorn, FNP  isosorbide mononitrate (IMDUR) 60 MG 24 hr tablet Take 1 tablet (60 mg total) by mouth daily. 05/13/16   Steele Sizer, MD  levothyroxine (SYNTHROID, LEVOTHROID) 75 MCG tablet TAKE 1 TABLET (75 MCG TOTAL) BY MOUTH DAILY AT 6 (SIX) AM. 01/30/17   Hubbard Hartshorn, FNP  Multiple Vitamin (MULTI-VITAMINS) TABS Take by mouth. Reported on 10/16/2015     [provider]  pantoprazole (PROTONIX) 40 MG tablet Take 1 tablet (40 mg total) by mouth daily. 08/23/16   Steele Sizer, MD  raloxifene (EVISTA) 60 MG tablet TAKE 1 TABLET BY MOUTH EVERY DAY 09/04/16   Ancil Boozer, Drue Stager, MD  rosuvastatin (CRESTOR) 10 MG tablet TAKE 1 TABLET (10 MG TOTAL) BY MOUTH DAILY. 07/10/16   Steele Sizer, MD  senna-docusate (SENOKOT-S) 8.6-50 MG tablet Take 1 tablet by mouth at bedtime as needed for mild constipation or moderate constipation. 06/23/16   Theodoro Grist, MD  sodium bicarbonate 650 MG tablet TAKE 2 TABLETS (1,300 MG TOTAL) BY MOUTH TWO (2) TIMES A DAY. 03/19/15   [provider]    Allergies Patient has no known allergies.  Family History  Problem Relation Age of Onset  . Diabetes Sister   . Hypertension Sister     Social History Social History  Substance Use Topics  . Smoking status: Never Smoker  . Smokeless tobacco: Current User    Types: Snuff  . Alcohol use No    Review of Systems  ___Unable to obtain _________________________________________   PHYSICAL EXAM:  VITAL SIGNS: ED Triage Vitals  Enc Vitals Group     BP 02/26/17 1634 132/61     Pulse Rate 02/26/17 1634 60     Resp 02/26/17 1634 18     Temp 02/26/17 1634 (!) 97.5 F (36.4 C)     Temp Source 02/26/17 1634 Oral     SpO2 02/26/17 1634 95 %     Weight 02/26/17 1636 110 lb 3.7 oz (50 kg)     Height --      Head Circumference --      Peak Flow --      Pain Score --      Pain Loc --      Pain Edu? --      Excl. in [place]? --     Constitutional: Alert Well appearing and in no acute distress. Eyes: Conjunctivae are normal.  Head: Atraumatic. Nose: No congestion/rhinnorhea. Mouth/Throat: Mucous membranes are moist.  Oropharynx non-erythematous. Neck: No stridor.  Cardiovascular: Normal rate, regular rhythm. Grossly normal heart sounds.  Good peripheral circulation. Respiratory: Normal respiratory effort.  No retractions. Lungs clear except crackles  in bases Gastrointestinal: Soft and nontender. No distention. No abdominal bruits. No CVA tenderness. Musculoskeletal: No lower extremity tenderness nor edema.  No joint effusions. Neurologic:  Normal speech and language. No new gross focal neurologic deficits are appreciated. She has chronic right-sided weakness from her old stroke Skin:  Skin is warm, dry and intact. No rash noted.   ____________________________________________   LABS (all labs ordered are listed, but only abnormal results are displayed)  Labs Reviewed  COMPREHENSIVE METABOLIC PANEL - Abnormal; Notable for the following:       Result Value   Glucose, Bld 117 (*)    BUN 52 (*)    Creatinine, Ser 3.34 (*)    Calcium 8.7 (*)    Albumin 3.2 (*)  ALT 12 (*)    GFR calc non Af Amer 11 (*)    GFR calc Af Amer 13 (*)    All other components within normal limits  CBC WITH DIFFERENTIAL/PLATELET - Abnormal; Notable for the following:    RBC 3.08 (*)    Hemoglobin 7.7 (*)    HCT 24.6 (*)    MCH 24.9 (*)    MCHC 31.1 (*)    RDW 17.0 (*)    All other components within normal limits  LACTIC ACID, PLASMA  TROPONIN I  BRAIN NATRIURETIC PEPTIDE  LACTIC ACID, PLASMA  URINALYSIS, COMPLETE (UACMP) WITH MICROSCOPIC  TROPONIN I   ____________________________________________  EKG  EKG read and interpreted by me shows sinus rhythm rate of 66 normal axis no acute ST-T wave changes computer is reading a ventricular premature complex that this is not correct ____________________________________________  RADIOLOGY IMPRESSION: No acute intracranial abnormality.  Stable appearance of left MCA territory chronic infarct.  Atrophy, chronic microvascular disease.   Electronically Signed   By: Fidela Salisbury M.D.   On: 02/26/2017 17:10  IMPRESSION: 1. Mild left base subsegmental atelectasis. Mild left base pleural scarring.  2. Large sliding hiatal hernia.   Electronically Signed   By: Marcello Moores   Register   On: 02/26/2017 17:02  ____________________________________________   PROCEDURES  Procedure(s) performed:  Procedures  Critical Care performed:   ____________________________________________   INITIAL IMPRESSION / ASSESSMENT AND PLAN / ED COURSE  Pertinent labs & imaging results that were available during my care of the patient were reviewed by me and considered in my medical decision making (see chart for details).  Family comes in reports the patient today was having difficulty walking and seemed to be weaker on the right side and then she had an episode that lasted about 20 minutes where she was slumping over in and basically unconscious. Very pale looking very sweaty this is now passed and she is back to baseline. Patient has had a heart attack in the past and she had another spell like this about a month ago.   Discussed at length with family who wish her to be admitted for further evaluation this could be some sort of cardiac event.  ____________________________________________   FINAL CLINICAL IMPRESSION(S) / ED DIAGNOSES  Final diagnoses:  Syncope and collapse      NEW MEDICATIONS STARTED DURING THIS VISIT:  New Prescriptions   No medications on file     Note:  This document was prepared using Dragon voice recognition software and may include unintentional dictation errors.    Nena Polio, MD 02/26/17 [phone]

## 2017-02-27 ENCOUNTER — Observation Stay: Admit: 2017-02-27 | Payer: Medicare Other

## 2017-02-27 DIAGNOSIS — D509 Iron deficiency anemia, unspecified: Secondary | ICD-10-CM | POA: Diagnosis not present

## 2017-02-27 DIAGNOSIS — I10 Essential (primary) hypertension: Secondary | ICD-10-CM | POA: Diagnosis not present

## 2017-02-27 DIAGNOSIS — G9340 Encephalopathy, unspecified: Secondary | ICD-10-CM | POA: Diagnosis not present

## 2017-02-27 DIAGNOSIS — R531 Weakness: Secondary | ICD-10-CM | POA: Diagnosis not present

## 2017-02-27 DIAGNOSIS — R4182 Altered mental status, unspecified: Secondary | ICD-10-CM | POA: Diagnosis not present

## 2017-02-27 DIAGNOSIS — I639 Cerebral infarction, unspecified: Secondary | ICD-10-CM | POA: Diagnosis not present

## 2017-02-27 LAB — BASIC METABOLIC PANEL
Anion gap: 6 (ref 5–15)
BUN: 53 mg/dL — AB (ref 6–20)
CALCIUM: 8.1 mg/dL — AB (ref 8.9–10.3)
CO2: 24 mmol/L (ref 22–32)
CREATININE: 3.34 mg/dL — AB (ref 0.44–1.00)
Chloride: 110 mmol/L (ref 101–111)
GFR calc Af Amer: 13 mL/min — ABNORMAL LOW (ref >60)
GFR calc non Af Amer: 11 mL/min — ABNORMAL LOW (ref >60)
GLUCOSE: 103 mg/dL — AB (ref 65–99)
Potassium: 4.2 mmol/L (ref 3.5–5.1)
Sodium: 140 mmol/L (ref 135–145)

## 2017-02-27 LAB — VITAMIN B12: Vitamin B-12: 253 pg/mL (ref 180–914)

## 2017-02-27 LAB — CBC
HCT: 19.5 % — ABNORMAL LOW (ref 35.0–47.0)
Hemoglobin: 6.2 g/dL — ABNORMAL LOW (ref 12.0–16.0)
MCH: 24.7 pg — AB (ref 26.0–34.0)
MCHC: 31.8 g/dL — AB (ref 32.0–36.0)
MCV: 77.6 fL — AB (ref 80.0–100.0)
Platelets: 238 10*3/uL (ref 150–440)
RBC: 2.51 MIL/uL — ABNORMAL LOW (ref 3.80–5.20)
RDW: 16.6 % — AB (ref 11.5–14.5)
WBC: 5.3 10*3/uL (ref 3.6–11.0)

## 2017-02-27 LAB — ABO/RH: ABO/RH(D): A POS

## 2017-02-27 LAB — HEMOGLOBIN AND HEMATOCRIT, BLOOD
HCT: 26.9 % — ABNORMAL LOW (ref 35.0–47.0)
HEMOGLOBIN: 8.6 g/dL — AB (ref 12.0–16.0)

## 2017-02-27 LAB — PREPARE RBC (CROSSMATCH)

## 2017-02-27 MED ORDER — ENSURE ENLIVE PO LIQD
237.0000 mL | Freq: Three times a day (TID) | ORAL | Status: DC
  Administered 2017-02-27 – 2017-02-28 (×3): 237 mL via ORAL

## 2017-02-27 MED ORDER — SODIUM CHLORIDE 0.9 % IV SOLN
Freq: Once | INTRAVENOUS | Status: AC
  Administered 2017-02-27: 11:00:00 via INTRAVENOUS

## 2017-02-27 NOTE — Progress Notes (Signed)
PT Cancellation Note  Patient Details Name: Crystal Faulkner MRN: [id number] DOB: 01-11-1925   Cancelled Treatment:    Reason Eval/Treat Not Completed: Medical issues which prohibited therapy.  Pt's Hgb currently noted to be 6.2.  Nursing reports plan for blood transfusion today.  Per PT guidelines for low Hgb, will hold PT at this time.  Will re-attempt PT eval at a later date/time as medically appropriate (discussed with nursing).  Leitha Bleak, PT 02/27/17, 11:18 AM [phone]

## 2017-02-27 NOTE — Progress Notes (Signed)
[facility] [place] NAME: Crystal Faulkner    MR#:  [id number]  DATE OF BIRTH:  [birth year]  SUBJECTIVE:  Came in with decrease responsiveness. No focal weakness other than chronic right sided weakness from previous stroke. dters in the room Awake and at baseline per famly  REVIEW OF SYSTEMS:   Review of Systems  Constitutional: Negative for chills, fever and weight loss.  HENT: Negative for ear discharge, ear pain and nosebleeds.   Eyes: Negative for blurred vision, pain and discharge.  Respiratory: Negative for sputum production, shortness of breath, wheezing and stridor.   Cardiovascular: Negative for chest pain, palpitations, orthopnea and PND.  Gastrointestinal: Negative for abdominal pain, diarrhea, nausea and vomiting.  Genitourinary: Negative for frequency and urgency.  Musculoskeletal: Negative for back pain and joint pain.  Neurological: Negative for sensory change, speech change, focal weakness and weakness.  Psychiatric/Behavioral: Negative for depression and hallucinations. The patient is not nervous/anxious.    Tolerating Diet:speech to see Tolerating PT: pending  DRUG ALLERGIES:  No Known Allergies  VITALS:  Blood pressure (!) 98/53, pulse 63, temperature 98.4 F (36.9 C), temperature source Oral, resp. rate 16, height 5' (1.524 m), weight 49.1 kg (108 lb 4.8 oz), SpO2 100 %.  PHYSICAL EXAMINATION:   Physical Exam  GENERAL:  83 y.o.-year-old patient lying in the bed with no acute distress.  EYES: Pupils equal, round, reactive to light and accommodation. No scleral icterus. Extraocular muscles intact.  HEENT: Head atraumatic, normocephalic. Oropharynx and nasopharynx clear.  NECK:  Supple, no jugular venous distention. No thyroid enlargement, no tenderness.  LUNGS: Normal breath sounds bilaterally, no wheezing, rales, rhonchi. No use of accessory muscles of respiration.  CARDIOVASCULAR: S1, S2 normal. No murmurs,  rubs, or gallops.  ABDOMEN: Soft, nontender, nondistended. Bowel sounds present. No organomegaly or mass.  EXTREMITIES: No cyanosis, clubbing or edema b/l.    NEUROLOGIC: Cranial nerves II through XII are intact. No focal Motor or sensory deficits b/l.  Chronic right upper and LE weakness, chronic dysarthria PSYCHIATRIC:  patient is alert and awake  SKIN: No obvious rash, lesion, or ulcer.   LABORATORY PANEL:  CBC  Recent Labs Lab 02/27/17 0633  WBC 5.3  HGB 6.2*  HCT 19.5*  PLT 238    Chemistries   Recent Labs Lab 02/26/17 1640 02/27/17 0633  NA 140 140  K 3.9 4.2  CL 108 110  CO2 23 24  GLUCOSE 117* 103*  BUN 52* 53*  CREATININE 3.34* 3.34*  CALCIUM 8.7* 8.1*  AST 25  --   ALT 12*  --   ALKPHOS 59  --   BILITOT 0.4  --    Cardiac Enzymes  Recent Labs Lab 02/26/17 2118  TROPONINI <0.03   RADIOLOGY:  Dg Chest 2 View  Result Date: 02/26/2017 CLINICAL DATA:  Altered mental status. EXAM: CHEST  2 VIEW COMPARISON:  06/22/2016. FINDINGS: Mediastinum and hilar structures normal. Heart size normal. Mild left base subsegmental atelectasis. Stable left base pleural thickening consistent scarring. No pneumothorax. Large sliding hiatal hernia. Metallic shrapnel noted over the chest and neck. IMPRESSION: 1. Mild left base subsegmental atelectasis. Mild left base pleural scarring. 2. Large sliding hiatal hernia. Electronically Signed   By: Marcello Moores  Register   On: 02/26/2017 17:02   Ct Head Wo Contrast  Result Date: 02/26/2017 CLINICAL DATA:  Increased lethargy. EXAM: CT HEAD WITHOUT CONTRAST TECHNIQUE: Contiguous axial images were obtained from the base of the skull through the vertex  without intravenous contrast. COMPARISON:  01/23/2017 FINDINGS: Brain: No evidence of acute infarction, hemorrhage, hydrocephalus, extra-axial collection or mass lesion/mass effect. Area of hypoattenuation in the left MCA territory is stable. There is microangiopathy and moderate brain parenchymal  volume loss. Vascular: Vascular calcifications at the skullbase. Skull: Normal. Negative for fracture or focal lesion. Sinuses/Orbits: No acute finding. Other: None. IMPRESSION: No acute intracranial abnormality. Stable appearance of left MCA territory chronic infarct. Atrophy, chronic microvascular disease. Electronically Signed   By: Fidela Salisbury M.D.   On: 02/26/2017 17:10   ASSESSMENT AND PLAN:   Crystal Faulkner  is a 83 y.o. female with a known history of Alzheimer's disease, chronic anemia, chronic kidney disease, coronary artery disease who is brought in by family due to decrease in responsiveness. Patient had a similar episode last month.Patient's daughter states that earlier today patient became very diaphoretic. And kind of slumped over  1. Decrease in her responsiveness could be likely due her drifting hgb -came in with hgb of 7.7--6.2--1 unit BT today -baseline hgb around 8-8.3 Pt has h/o IDA and on iron supplement already.  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 no active bleeding -d/w 2 dters  And agreeable no aggressive GI w/u   2. Anemia which appears to be chronic but worsening likely due to anemia of chronic disease Transfusing 1 unit BT today  3. Chronic kidney disease stage IV worsening patient does not want dialysis [facility] nephrology  4. Previous history of CVA  -continue Plavix  5. Coronary artery disease - continue Plavix and Imdur  6. Hypothyroidism  -continue Synthroid  7. Miscellaneous due to having worsening anemia I will hold off on Lovenox - SCDs for now  Case discussed with Care Management/Social Worker. Management plans discussed with the patient, family and they are in agreement.  CODE STATUS: DNR (per dters)  DVT Prophylaxis: SCD  TOTAL TIME TAKING CARE OF THIS PATIENT: 30 minutes.  >50% time spent on counselling and coordination of care  POSSIBLE D/C IN 1-2 DAYS, DEPENDING ON CLINICAL CONDITION.  Note:  This dictation was prepared with Dragon dictation along with smaller phrase technology. Any transcriptional errors that result from this process are unintentional.  Crystal Faulkner M.D on 02/27/2017 at 8:21 AM  Between 7am to 6pm - Pager - [phone]  After 6pm go to [email or web address] - password [email or web address] [facility]  Office  [phone]  CC: Primary care physician; Steele Sizer, MD

## 2017-02-27 NOTE — Evaluation (Signed)
Clinical/Bedside Swallow Evaluation Patient Details  Name: AVERLY ERICSON MRN: [id number] Date of Birth: July 20, 1925  Today's Date: 02/27/2017 Time: SLP Start Time (ACUTE ONLY): 1150 SLP Stop Time (ACUTE ONLY): 1250 SLP Time Calculation (min) (ACUTE ONLY): 60 min  Past Medical History:  Past Medical History:  Diagnosis Date  . Allergy   . Alzheimer's disease   . Anemia   . CCF (congestive cardiac failure) ([place])   . Chronic kidney disease   . Chronic stable angina ([place])   . Gout   . H/O: GI bleed   . Hypercholesteremia   . Hyperglycemia   . Hyperlipidemia   . Hypertension   . Incontinence   . Myocardial infarction ([place])   . Osteoporosis   . Stroke ([place])   . Thyroid disease   . Vitamin D deficiency    Past Surgical History:  Past Surgical History:  Procedure Laterality Date  . EXPLORATORY LAPAROTOMY    . EYE SURGERY Bilateral    cataract   HPI:  Pt is a 83 y.o. female with a known history of Alzheimer's Disease, chronic anemia, chronic kidney disease, old CVA, coronary artery disease who is brought in by family due to decrease in responsiveness. Patient had a similar episode last month and in the past. Pt is nonverbal d/t old CVA. Pt does have a "good appetite" per family present and eats "soft, cooked" foods at home. She mostly feeds herself but requires setup assist d/t RUE weakness d/t old CVA. Pt currently awake, alert and nodded to basic questions.    Assessment / Plan / Recommendation Clinical Impression  Pt appears to present w/ min Oral phase dysphagia suspect d/t Cognitive impairment/decline and appears at reduced risk for aspiration when following general aspiration precautions and when given min monitoring to slow down, take smaller bites, and clear orally b/t EACH bite. Pt tended to be a min impulsive feeder; not fully clearing mouth b/t bites. Pt consumed both mech soft trials of solids and thin liquids via cup/straw w/ no overt s/s of aspiration noted. Oral phase  min slower to fully clear b/t bites. Recommend monitoring at meals to give verbal cues for aspiration precautions; a mech soft diet consistency w/ thin liquids. Recommend Pills in Puree as needed for easier swallowing.  SLP Visit Diagnosis: Dysphagia, oral phase (R13.11)    Aspiration Risk   (reduced following precautions)    Diet Recommendation  Dysphagia level 3(mech soft) w/ Thin liquids; general aspiration precautions; monitoring at meals  Medication Administration: Whole meds with puree (for easier swallowing as needed)    Other  Recommendations Recommended Consults:  (n/a at this time) Oral Care Recommendations: Oral care BID;Patient independent with oral care;Staff/trained caregiver to provide oral care   Follow up Recommendations None      Frequency and Duration            Prognosis Prognosis for Safe Diet Advancement: Good Barriers to Reach Goals: Cognitive deficits (baseline)      Swallow Study   General Date of Onset: 02/26/17 HPI: Pt is a 83 y.o. female with a known history of Alzheimer's Disease, chronic anemia, chronic kidney disease, old CVA, coronary artery disease who is brought in by family due to decrease in responsiveness. Patient had a similar episode last month and in the past. Pt is nonverbal d/t old CVA. Pt does have a "good appetite" per family present and eats "soft, cooked" foods at home. She mostly feeds herself but requires setup assist d/t RUE weakness d/t old  CVA. Pt currently awake, alert and nodded to basic questions.  Type of Study: Bedside Swallow Evaluation Previous Swallow Assessment: none indicated Diet Prior to this Study: Dysphagia 3 (soft);Thin liquids Temperature Spikes Noted: No (wbc 5.3) Respiratory Status: Room air History of Recent Intubation: No Behavior/Cognition: Alert;Cooperative;Pleasant mood;Confused;Requires cueing;Distractible (nonverbal baseline) Oral Cavity Assessment: Within Functional Limits Oral Care Completed by SLP:  Recent completion by staff Oral Cavity - Dentition: Edentulous Vision: Functional for self-feeding Self-Feeding Abilities: Able to feed self;Needs assist;Needs set up Patient Positioning: Upright in bed (repositioned her) Baseline Vocal Quality:  (nonverbal) Volitional Cough: Cognitively unable to elicit Volitional Swallow: Unable to elicit    Oral/Motor/Sensory Function Overall Oral Motor/Sensory Function: Within functional limits (w/ bolus management)   Ice Chips Ice chips: Not tested   Thin Liquid Thin Liquid: Within functional limits Presentation: Cup;Self Fed;Straw (~4 ozs total)    Nectar Thick Nectar Thick Liquid: Not tested   Honey Thick Honey Thick Liquid: Not tested   Puree Puree: Within functional limits Presentation: Self Fed;Spoon (4-5 trials)   Solid   GO   Solid: Impaired (mech soft foods) Presentation: Self Fed;Spoon (10+ boluses) Oral Phase Impairments:  (min impulsive feeder; not fully clearing mouth b/t bites) Oral Phase Functional Implications: Prolonged oral transit (for bolus management) Pharyngeal Phase Impairments:  (none) Other Comments: Daughters gave verbal cues to slow down, smaller bites, clear mouth    Functional Assessment Tool Used: clinical judgement Functional Limitations: Swallowing Swallow Current Status (L3903): At least 1 percent but less than 20 percent impaired, limited or restricted Swallow Goal Status [phone]): At least 1 percent but less than 20 percent impaired, limited or restricted Swallow Discharge Status [phone]): At least 1 percent but less than 20 percent impaired, limited or restricted   Orinda Kenner, [place], CCC-SLP Emalea Mix 02/27/2017,1:45 PM

## 2017-02-27 NOTE — Care Management Obs Status (Signed)
[place] NOTIFICATION   Patient Details  Name: AMEYAH BANGURA MRN: [id number] Date of Birth: 1925-03-08   Medicare Observation Status Notification Given:  Yes    Beverly Sessions, RN 02/27/2017, 4:18 PM

## 2017-02-27 NOTE — Progress Notes (Signed)
Initial Nutrition Assessment  DOCUMENTATION CODES:   Non-severe (moderate) malnutrition in context of chronic illness  INTERVENTION:   Ensure Enlive po TID, each supplement provides 350 kcal and 20 grams of protein  Magic cup TID with meals, each supplement provides 290 kcal and 9 grams of protein  NUTRITION DIAGNOSIS:   Malnutrition (moderate) related to chronic illness (stroke, advanced age) as evidenced by moderate depletion of body fat, moderate depletions of muscle mass.  GOAL:   Patient will meet greater than or equal to 90% of their needs  MONITOR:   PO intake, Supplement acceptance, Labs, Weight trends  REASON FOR ASSESSMENT:   Malnutrition Screening Tool    ASSESSMENT:   83 y.o.femalewith a known history of Alzheimer's disease, chronic anemia, chronic kidney disease, coronary artery disease who is brought in by family due to decrease in responsiveness. Patient had a similar episode last month.Patient's daughter states that earlier today patient became very diaphoretic. And kind of slumped over   Met with pt and family in room today. Pt is non-verbal but will nod her head to answer yes/no questions. History obtained from family who reports that pt is a good eater at home and was eating well up until admit. Pt also loves chocolate Ensure and drinks these at home. Pt's meal tray from breakfast was on her bedside table and was 25% eaten; family reports that pt did not like the food that was sent up. Per family, pt has had weight loss; per chart, pt has lost 14lbs(11%) in 10 months which is not severe. RD will order supplements. Please continue to encourage intake of meals and supplements.   Medications reviewed and include: allopurino., cephalexin, Vit D, plavix, ferrous sulfate, synthroid, protonix, Na Bicarb  Labs reviewed: BUN 53(H), creat 3.34(H), Ca 8.1(L) Hgb 6.2(L), Hct 19.5(L)  Nutrition-Focused physical exam completed. Findings are moderate fat depletion in  arms and chest, moderate muscle depletions over entire body, and no edema.   Diet Order:  Diet 2 gram sodium Room service appropriate? Yes; Fluid consistency: Thin  Skin:  Reviewed, no issues  Last BM:  none since admit  Height:   Ht Readings from Last 1 Encounters:  02/26/17 5' (1.524 m)    Weight:   Wt Readings from Last 1 Encounters:  02/26/17 108 lb 4.8 oz (49.1 kg)    Ideal Body Weight:  45.4 kg  BMI:  Body mass index is 21.15 kg/m.  Estimated Nutritional Needs:   Kcal:  1300-1600kcal/day   Protein:  54-64g/day   Fluid:  >1.3L/day   EDUCATION NEEDS:   No education needs identified at this time  Koleen Distance MS, RD, [place] Pager #[phone] After Hours Pager: [phone]

## 2017-02-27 NOTE — Progress Notes (Signed)
Palliative Medicine Team  Due to high volume of referrals, there is a delay seeing this patient. PMT not at [facility] over the weekend but will arrange goals of care with patient and family on Monday if still hospitalized. Thank you for the opportunity to participate in the care of Crystal Faulkner.   Ihor Dow, FNP-C Palliative Medicine Team  Phone: [phone] Fax: [phone]

## 2017-02-28 DIAGNOSIS — D509 Iron deficiency anemia, unspecified: Secondary | ICD-10-CM | POA: Diagnosis not present

## 2017-02-28 DIAGNOSIS — G9340 Encephalopathy, unspecified: Secondary | ICD-10-CM | POA: Diagnosis not present

## 2017-02-28 DIAGNOSIS — R531 Weakness: Secondary | ICD-10-CM | POA: Diagnosis not present

## 2017-02-28 DIAGNOSIS — I639 Cerebral infarction, unspecified: Secondary | ICD-10-CM | POA: Diagnosis not present

## 2017-02-28 DIAGNOSIS — R4182 Altered mental status, unspecified: Secondary | ICD-10-CM | POA: Diagnosis not present

## 2017-02-28 DIAGNOSIS — E440 Moderate protein-calorie malnutrition: Secondary | ICD-10-CM | POA: Insufficient documentation

## 2017-02-28 DIAGNOSIS — I10 Essential (primary) hypertension: Secondary | ICD-10-CM | POA: Diagnosis not present

## 2017-02-28 LAB — BPAM RBC
Blood Product Expiration Date: 201807192359
ISSUE DATE / TIME: 201807061120
Unit Type and Rh: 6200

## 2017-02-28 LAB — TYPE AND SCREEN
ABO/RH(D): A POS
Antibody Screen: NEGATIVE
UNIT DIVISION: 0

## 2017-02-28 MED ORDER — ENSURE ENLIVE PO LIQD: 237.0000 mL | Freq: Three times a day (TID) | ORAL | 12 refills | Status: DC

## 2017-02-28 NOTE — Discharge Summary (Signed)
[facility] NAME: Debie Ashline    MR#:  [id number]  DATE OF BIRTH:  10-12-1924  DATE OF ADMISSION:  02/26/2017 ADMITTING PHYSICIAN: Dustin Flock, MD  DATE OF DISCHARGE: 02/28/17  PRIMARY CARE PHYSICIAN: Steele Sizer, MD    ADMISSION DIAGNOSIS:  Syncope and collapse [R55] Altered mental status [R41.82]  DISCHARGE DIAGNOSIS:  Acute encephalopathy -resolved Anemia of chronic disease with IDA---s/p 1 unit BT H/o CVA Dementia  SECONDARY DIAGNOSIS:   Past Medical History:  Diagnosis Date  . Allergy   . Alzheimer's disease   . Anemia   . CCF (congestive cardiac failure) ([place])   . Chronic kidney disease   . Chronic stable angina ([place])   . Gout   . H/O: GI bleed   . Hypercholesteremia   . Hyperglycemia   . Hyperlipidemia   . Hypertension   . Incontinence   . Myocardial infarction ([place])   . Osteoporosis   . Stroke ([place])   . Thyroid disease   . Vitamin D deficiency     HOSPITAL COURSE:  [name] a 83 y.o.femalewith a known history of Alzheimer's disease, chronic anemia, chronic kidney disease, coronary artery disease who is brought in by family due to decrease in responsiveness. Patient had a similar episode last month.Patient's daughter states that earlier today patient became very diaphoretic. And kind of slumped over  1. Acute encephalopathy Decrease in her responsiveness could be likely due her drifting hgb -came in with hgb of 7.7--6.2--1 unit BT --8.6 -baseline hgb around 8-8.3 Pt has h/o IDA and on iron supplement already.  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 no active bleeding -d/w 2 dters  And agreeable no aggressive GI w/u  -pt alert and awake  2. Anemia which appears to be chronic but worsening likely due to anemia of chronic disease Transfused 1 unit BT   3. Chronic kidney disease stage IV worsening patient does not want dialysis [facility]  4.  Previous history of CVA  -continue Plavix  5.Coronary artery disease - continue Plavix and Imdur  6. Hypothyroidism  -continue Synthroid  7. Miscellaneous due to having worsening anemia I will hold off on Lovenox - SCDs for now  PT recommends RW D/c home with HHPT  D/w dter Pt is DNR  CONSULTS OBTAINED:    DRUG ALLERGIES:  No Known Allergies  DISCHARGE MEDICATIONS:   Current Discharge Medication List    START taking these medications   Details  feeding supplement, ENSURE ENLIVE, (ENSURE ENLIVE) LIQD Take 237 mLs by mouth 3 (three) times daily between meals. Qty: 237 mL, Refills: 12      CONTINUE these medications which have NOT CHANGED   Details  allopurinol (ZYLOPRIM) 100 MG tablet Take 1 tablet (100 mg total) by mouth daily. Qty: 90 tablet, Refills: 2   Associated Diagnoses: Controlled gout    cephALEXin (KEFLEX) 125 MG/5ML suspension Take 10 mLs by mouth daily. Refills: 0    Cholecalciferol (VITAMIN D3) 2000 UNITS capsule Take 1 capsule by mouth daily.    clopidogrel (PLAVIX) 75 MG tablet Take 1 tablet (75 mg total) by mouth daily. 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    furosemide (LASIX) 40 MG tablet TAKE 1 TABLET (40 MG TOTAL) BY MOUTH DAILY. Qty: 90 tablet, Refills: 1   Associated Diagnoses: Chronic kidney disease (CKD), stage V ([place]); Chronic stable angina (HCC)    isosorbide mononitrate (IMDUR) 60 MG 24 hr tablet Take 1 tablet (60 mg total) by mouth daily. Qty: 90 tablet, Refills: 2   Associated Diagnoses: Chronic stable angina (HCC)    levothyroxine (SYNTHROID, LEVOTHROID) 75 MCG tablet TAKE 1 TABLET (75 MCG TOTAL) BY MOUTH  DAILY AT 6 (SIX) AM. Qty: 90 tablet, Refills: 1   Associated Diagnoses: Adult hypothyroidism    pantoprazole (PROTONIX) 40 MG tablet Take 1 tablet (40 mg total) by mouth daily. Qty: 90 tablet, Refills: 1    raloxifene (EVISTA) 60 MG tablet TAKE 1 TABLET BY MOUTH EVERY DAY Qty: 90 tablet, Refills: 2    rosuvastatin (CRESTOR) 10 MG tablet TAKE 1 TABLET (10 MG TOTAL) BY MOUTH DAILY. Qty: 90 tablet, Refills: 2   Associated Diagnoses: Arteriosclerosis of coronary artery; Dyslipidemia    senna-docusate (SENOKOT-S) 8.6-50 MG tablet Take 1 tablet by mouth at bedtime as needed for mild constipation or moderate constipation. Qty: 30 tablet, Refills: 5    sodium bicarbonate 650 MG tablet TAKE 2 TABLETS (1,300 MG TOTAL) BY MOUTH TWO (2) TIMES A DAY. Refills: 11        If you experience worsening of your admission symptoms, develop shortness of breath, life threatening emergency, suicidal or homicidal thoughts you must seek medical attention immediately by calling 911 or calling your MD immediately  if symptoms less severe.  You Must read complete instructions/literature along with all the possible adverse reactions/side effects for all the Medicines you take and that have been prescribed to you. Take any new Medicines after you have completely understood and accept all the possible adverse reactions/side effects.   Please note  You were cared for by a hospitalist during your hospital stay. If you have any questions about your discharge medications or the care you received while you were in the hospital after you are discharged, you can call the unit and asked to speak with the hospitalist on call if the hospitalist that took care of you is not available. Once you are discharged, your primary care physician will handle any further medical issues. Please note that NO REFILLS for any discharge medications will be authorized once you are discharged, as it is imperative that you return to your primary  care physician (or establish a relationship with a primary care physician if you do not have one) for your aftercare needs so that they can reassess your need for medications and monitor your lab values. Today   SUBJECTIVE  more alert--in the chair dter in the room VITAL SIGNS:  Blood pressure (!) 118/58, pulse 66, temperature 98.2 F (36.8 C), temperature source Oral, resp. rate 16, height 5' (1.524 m), weight 49.1 kg (108 lb 4.8 oz), SpO2 99 %.  I/O:   Intake/Output Summary (Last 24 hours) at 02/28/17 1056 Last data filed at 02/28/17 0939  Gross per 24 hour  Intake          1347.67 ml  Output                0 ml  Net          1347.67 ml    PHYSICAL EXAMINATION:  GENERAL:  83 y.o.-year-old patient lying in the bed with no acute distress.  EYES:  Pupils equal, round, reactive to light and accommodation. No scleral icterus. Extraocular muscles intact.  HEENT: Head atraumatic, normocephalic. Oropharynx and nasopharynx clear.  NECK:  Supple, no jugular venous distention. No thyroid enlargement, no tenderness.  LUNGS: Normal breath sounds bilaterally, no wheezing, rales,rhonchi or crepitation. No use of accessory muscles of respiration.  CARDIOVASCULAR: S1, S2 normal. No murmurs, rubs, or gallops.  ABDOMEN: Soft, non-tender, non-distended. Bowel sounds present. No organomegaly or mass.  EXTREMITIES: No pedal edema, cyanosis, or clubbing.  NEUROLOGIC: Cranial nerves II through XII are intact. Chronic right upper and lower extremity weakness due to previous stroke. Chronic dysarthria.  PSYCHIATRIC: The patient is alert and awake  SKIN: No obvious rash, lesion, or ulcer.   DATA REVIEW:   CBC   Recent Labs Lab 02/27/17 0633 02/27/17 1836  WBC 5.3  --   HGB 6.2* 8.6*  HCT 19.5* 26.9*  PLT 238  --     Chemistries   Recent Labs Lab 02/26/17 1640 02/27/17 0633  NA 140 140  K 3.9 4.2  CL 108 110  CO2 23 24  GLUCOSE 117* 103*  BUN 52* 53*  CREATININE 3.34* 3.34*  CALCIUM  8.7* 8.1*  AST 25  --   ALT 12*  --   ALKPHOS 59  --   BILITOT 0.4  --     Microbiology Results   No results found for this or any previous visit (from the past 240 hour(s)).  RADIOLOGY:  Dg Chest 2 View  Result Date: 02/26/2017 CLINICAL DATA:  Altered mental status. EXAM: CHEST  2 VIEW COMPARISON:  06/22/2016. FINDINGS: Mediastinum and hilar structures normal. Heart size normal. Mild left base subsegmental atelectasis. Stable left base pleural thickening consistent scarring. No pneumothorax. Large sliding hiatal hernia. Metallic shrapnel noted over the chest and neck. IMPRESSION: 1. Mild left base subsegmental atelectasis. Mild left base pleural scarring. 2. Large sliding hiatal hernia. Electronically Signed   By: Marcello Moores  Register   On: 02/26/2017 17:02   Ct Head Wo Contrast  Result Date: 02/26/2017 CLINICAL DATA:  Increased lethargy. EXAM: CT HEAD WITHOUT CONTRAST TECHNIQUE: Contiguous axial images were obtained from the base of the skull through the vertex without intravenous contrast. COMPARISON:  01/23/2017 FINDINGS: Brain: No evidence of acute infarction, hemorrhage, hydrocephalus, extra-axial collection or mass lesion/mass effect. Area of hypoattenuation in the left MCA territory is stable. There is microangiopathy and moderate brain parenchymal volume loss. Vascular: Vascular calcifications at the skullbase. Skull: Normal. Negative for fracture or focal lesion. Sinuses/Orbits: No acute finding. Other: None. IMPRESSION: No acute intracranial abnormality. Stable appearance of left MCA territory chronic infarct. Atrophy, chronic microvascular disease. Electronically Signed   By: Fidela Salisbury M.D.   On: 02/26/2017 17:10     Management plans discussed with the patient, family and they are in agreement.  CODE STATUS:     Code Status Orders        Start     Ordered   02/27/17 [phone]  Do not attempt resuscitation (DNR)  Continuous    Question Answer Comment  In the event of  cardiac or respiratory ARREST Do not call a "code blue"   In the event of cardiac or respiratory ARREST Do not perform Intubation, CPR, defibrillation or ACLS   In the event of cardiac or respiratory ARREST Use medication by any route, position, wound care, and other measures to relive pain and suffering. May use oxygen, suction and manual treatment of airway obstruction as needed for comfort.  02/27/17 0836    Code Status History    Date Active Date Inactive Code Status Order ID Comments User Context   02/26/2017  8:49 PM 02/27/2017  8:36 AM Full Code [id number]  Dustin Flock, MD Inpatient   06/22/2016  1:25 AM 06/23/2016  6:25 PM Full Code [id number]  Hugelmeyer, Ubaldo Glassing, DO Inpatient    Advance Directive Documentation     Most Recent Value  Type of Advance Directive  Healthcare Power of Attorney  Pre-existing out of facility DNR order (yellow form or pink MOST form)  -  "MOST" Form in Place?  -      TOTAL TIME TAKING CARE OF THIS PATIENT: *40* minutes.    Mousa Prout M.D on 02/28/2017 at 10:56 AM  Between 7am to 6pm - Pager - [phone] After 6pm go to [email or web address] - password [email or web address]  Office  [phone]  CC: Primary care physician; Steele Sizer, MD

## 2017-02-28 NOTE — Progress Notes (Signed)
Pt d/ced  Home with her daughters.  Her Hgb yesterday was 6.2 and 1 u PRBC given - Hgb improved to 8.6.  Patient eating and drinking well. She's alert and nods her head appropriately to questions but she is nonverbal.  VSS.  Will recommend to daughters that she eat frequent small meals instead of large meals and use supplement drinks.  Pt wked with PT and got up to chair.  Dr. Posey Pronto ordered walker for her and she will have Home PT. IVs removed by nurse tech.  Reviewed d/c instructions and f/u appts with family. She will f/u with [facility] for worsening kidney function.   She will transport with family.

## 2017-02-28 NOTE — Care Management Note (Signed)
Case Management Note  Patient Details  Name: EVON DEJARNETT MRN: [id number] Date of Birth: 10/03/1924  Subjective/Objective:      A resumption of care order was called to [facility] [place] for current home health services. A request for delivery of a RW to Ms Luginbill's hospital room today was called to Melene Muller at [facility].               Action/Plan:   Expected Discharge Date:  02/27/17               Expected Discharge Plan:   02/28/17  In-House Referral:     Discharge planning Services     Post Acute Care Choice:   Yes Choice offered to:   Patient  DME Arranged:   RW DME Agency:   [facility]  HH Arranged:   PT HH Agency:   [facility]  Status of Service:   Completed.  If discussed at [place] of Stay Meetings, dates discussed:    Additional Comments:  Margueritte Guthridge A, RN 02/28/2017, 10:50 AM

## 2017-02-28 NOTE — Evaluation (Signed)
Physical Therapy Evaluation Patient Details Name: Crystal Faulkner MRN: [id number] DOB: 1925-07-16 Today's Date: 02/28/2017   History of Present Illness  83 yo female with onset of AMS, diaphoresis, malnourishment, and slumping over with reduced ability to move was admitted.  Dx acute encephalopathy, anemia with transfusion.  PMHx:  CVA with R hemiparesis, CHF, MI, CKD 4, gout  Clinical Impression  Pt is returning to hosp for transfusion and now referred to PT for evaluation of her mobility.  Her tolerance is not at previous level but did already have HHPT in to see her when she returned to [place].  Will follow acutely and asked for a RW as pt is definitely more weak on R side with 2 person mod assist needed as she fatigues.  Family is already giving HHA and will just need to assist with RW with HHPT to progress strengthening as is appropriate.      Follow Up Recommendations Home health PT;Supervision for mobility/OOB;Supervision/Assistance - 24 hour    Equipment Recommendations  Rolling walker with 5" wheels    Recommendations for Other Services       Precautions / Restrictions Precautions Precautions: Fall (telemetry) Restrictions Weight Bearing Restrictions: No      Mobility  Bed Mobility               General bed mobility comments: up when PT arrived  Transfers Overall transfer level: Needs assistance Equipment used: 1 person hand held assist Transfers: Sit to/from Stand Sit to Stand: Min assist         General transfer comment: mod assist to lower to chair after walk  Ambulation/Gait Ambulation/Gait assistance: Min assist;Mod assist Ambulation Distance (Feet): 150 Feet Assistive device: 1 person hand held assist;2 person hand held assist Gait Pattern/deviations: Step-to pattern;Step-through pattern;Decreased stride length;Decreased stance time - right;Decreased step length - left;Decreased dorsiflexion - right;Decreased weight shift to right;Wide base of  support;Trunk flexed Gait velocity: reduced Gait velocity interpretation: Below normal speed for age/gender General Gait Details: leaning to L side as she progressed  Stairs            Wheelchair Mobility    Modified Rankin (Stroke Patients Only) Modified Rankin (Stroke Patients Only) Pre-Morbid Rankin Score: Moderate disability Modified Rankin: Moderately severe disability     Balance Overall balance assessment: Needs assistance Sitting-balance support: Feet supported Sitting balance-Leahy Scale: Fair     Standing balance support: Bilateral upper extremity supported Standing balance-Leahy Scale: Poor                               Pertinent Vitals/Pain Pain Assessment: Faces Faces Pain Scale: No hurt    Home Living Family/patient expects to be discharged to:: Private residence Living Arrangements: Children Available Help at Discharge: Family;Available 24 hours/day Type of Home: House Home Access: Stairs to enter Entrance Stairs-Rails:  (uses HHA of 2 for all mobility) Entrance Stairs-Number of Steps: 2 Home Layout: One level Home Equipment: Cane - single point;Other (comment) (never got a walker due to PT in past discouraging this)      Prior Function Level of Independence: Needs assistance   Gait / Transfers Assistance Needed: HHA with family for all gait  ADL's / Homemaking Assistance Needed: daughters keep the house cared for and make meals        Hand Dominance        Extremity/Trunk Assessment   Upper Extremity Assessment Upper Extremity Assessment: Overall WFL for tasks assessed  Lower Extremity Assessment Lower Extremity Assessment: Generalized weakness;RLE deficits/detail RLE Deficits / Details: R side became weaker as PT walked pt and she leaned heavily onto LLE to avoid it    Cervical / Trunk Assessment Cervical / Trunk Assessment: Kyphotic  Communication   Communication: Expressive difficulties  Cognition  Arousal/Alertness: Awake/alert Behavior During Therapy: WFL for tasks assessed/performed Overall Cognitive Status: Within Functional Limits for tasks assessed                                        General Comments General comments (skin integrity, edema, etc.): Pt has decreased her ability to walk since home, now requiring 2 person assist HHA.  Talked to daughters about the need for RW which they have agreed to do.    Exercises     Assessment/Plan    PT Assessment Patient needs continued PT services  PT Problem List Decreased strength;Decreased range of motion;Decreased activity tolerance;Decreased balance;Decreased mobility;Decreased coordination;Decreased knowledge of use of DME;Decreased safety awareness       PT Treatment Interventions DME instruction;Gait training;Stair training;Functional mobility training;Therapeutic activities;Therapeutic exercise;Balance training;Neuromuscular re-education;Patient/family education    PT Goals (Current goals can be found in the Care Plan section)  Acute Rehab PT Goals Patient Stated Goal: none stated nonverbal PT Goal Formulation: With family Time For Goal Achievement: 03/14/17 Potential to Achieve Goals: Good    Frequency Min 2X/week   Barriers to discharge Inaccessible home environment stairs to enter house    Co-evaluation               AM-PAC PT "6 Clicks" Daily Activity  Outcome Measure Difficulty turning over in bed (including adjusting bedclothes, sheets and blankets)?: A Little Difficulty moving from lying on back to sitting on the side of the bed? : A Little Difficulty sitting down on and standing up from a chair with arms (e.g., wheelchair, bedside commode, etc,.)?: A Little Help needed moving to and from a bed to chair (including a wheelchair)?: A Little Help needed walking in hospital room?: A Lot Help needed climbing 3-5 steps with a railing? : A Lot 6 Click Score: 16    End of Session  Equipment Utilized During Treatment: Gait belt Activity Tolerance: Patient limited by fatigue Patient left: in chair;with call bell/phone within reach;with chair alarm set;with family/visitor present Nurse Communication: Mobility status PT Visit Diagnosis: Unsteadiness on feet (R26.81);Muscle weakness (generalized) (M62.81);Difficulty in walking, not elsewhere classified (R26.2);Hemiplegia and hemiparesis Hemiplegia - Right/Left: Right Hemiplegia - dominant/non-dominant: Dominant Hemiplegia - caused by: Unspecified    Time: 1610-9604 PT Time Calculation (min) (ACUTE ONLY): 25 min   Charges:   PT Evaluation $PT Eval Moderate Complexity: 1 Procedure PT Treatments $Gait Training: 8-22 mins   PT G Codes:   PT G-Codes **NOT FOR INPATIENT CLASS** Functional Assessment Tool Used: AM-PAC 6 Clicks Basic Mobility;Clinical judgement Functional Limitation: Mobility: Walking and moving around Mobility: Walking and Moving Around Current Status ([id number]): At least 40 percent but less than 60 percent impaired, limited or restricted Mobility: Walking and Moving Around Goal Status [phone]): At least 1 percent but less than 20 percent impaired, limited or restricted    Ramond Dial 02/28/2017, 12:13 PM   Mee Hives, PT MS Acute Rehab Dept. Number: [place] and [place]

## 2017-03-02 ENCOUNTER — Telehealth: Payer: Self-pay

## 2017-03-02 NOTE — Telephone Encounter (Signed)
Called patient to check on her and have her schedule a hospital follow up. Patient daughter states she is very weak and lost blood. So they gave her blood while admitted and started her on Iron 325 mg bid.  Patient has a upcoming appointment with Dr. Ancil Boozer.

## 2017-03-05 DIAGNOSIS — R4189 Other symptoms and signs involving cognitive functions and awareness: Secondary | ICD-10-CM | POA: Diagnosis not present

## 2017-03-05 DIAGNOSIS — N390 Urinary tract infection, site not specified: Secondary | ICD-10-CM | POA: Diagnosis not present

## 2017-03-09 DIAGNOSIS — N390 Urinary tract infection, site not specified: Secondary | ICD-10-CM | POA: Diagnosis not present

## 2017-03-09 DIAGNOSIS — R4189 Other symptoms and signs involving cognitive functions and awareness: Secondary | ICD-10-CM | POA: Diagnosis not present

## 2017-03-11 DIAGNOSIS — R4189 Other symptoms and signs involving cognitive functions and awareness: Secondary | ICD-10-CM | POA: Diagnosis not present

## 2017-03-11 DIAGNOSIS — N390 Urinary tract infection, site not specified: Secondary | ICD-10-CM | POA: Diagnosis not present

## 2017-03-13 ENCOUNTER — Ambulatory Visit (INDEPENDENT_AMBULATORY_CARE_PROVIDER_SITE_OTHER): Payer: Medicare Other | Admitting: Family Medicine

## 2017-03-13 ENCOUNTER — Encounter: Payer: Self-pay | Admitting: Family Medicine

## 2017-03-13 VITALS — BP 134/62 | HR 67 | Temp 97.3°F | Resp 16 | Ht 60.0 in | Wt 102.2 lb

## 2017-03-13 DIAGNOSIS — D692 Other nonthrombocytopenic purpura: Secondary | ICD-10-CM

## 2017-03-13 DIAGNOSIS — R4189 Other symptoms and signs involving cognitive functions and awareness: Secondary | ICD-10-CM | POA: Diagnosis not present

## 2017-03-13 DIAGNOSIS — Z87898 Personal history of other specified conditions: Secondary | ICD-10-CM | POA: Diagnosis not present

## 2017-03-13 DIAGNOSIS — N185 Chronic kidney disease, stage 5: Secondary | ICD-10-CM

## 2017-03-13 DIAGNOSIS — I2510 Atherosclerotic heart disease of native coronary artery without angina pectoris: Secondary | ICD-10-CM

## 2017-03-13 DIAGNOSIS — N390 Urinary tract infection, site not specified: Secondary | ICD-10-CM | POA: Diagnosis not present

## 2017-03-13 LAB — COMPLETE METABOLIC PANEL WITH GFR
ALK PHOS: 77 U/L (ref 33–130)
ALT: 8 U/L (ref 6–29)
AST: 14 U/L (ref 10–35)
Albumin: 3.8 g/dL (ref 3.6–5.1)
BILIRUBIN TOTAL: 0.3 mg/dL (ref 0.2–1.2)
BUN: 56 mg/dL — ABNORMAL HIGH (ref 7–25)
CO2: 26 mmol/L (ref 20–31)
Calcium: 9 mg/dL (ref 8.6–10.4)
Chloride: 105 mmol/L (ref 98–110)
Creat: 3.56 mg/dL — ABNORMAL HIGH (ref 0.60–0.88)
GFR, EST AFRICAN AMERICAN: 12 mL/min — AB (ref >=60)
GFR, EST NON AFRICAN AMERICAN: 11 mL/min — AB (ref >=60)
Glucose, Bld: 87 mg/dL (ref 65–99)
POTASSIUM: 4.3 mmol/L (ref 3.5–5.3)
Sodium: 143 mmol/L (ref 135–146)
TOTAL PROTEIN: 6.8 g/dL (ref 6.1–8.1)

## 2017-03-13 NOTE — Progress Notes (Signed)
Name: Crystal Faulkner   MRN: [id number]    DOB: Jan 04, 1925   Date:03/13/2017       Progress Note  Subjective  Chief Complaint  Chief Complaint  Patient presents with  . Hospitalization Follow-up    Patient had a syncopy episode from a hypotension episode and being anemic.     HPI  Hospital follow up : syncope happened on 02/26/2017 at home, daughter called 77 and was found to have acute encephalopathy, worsening of anemia. She was given blood transfusion and is doing better. She has been taking ferrous sulfate daily, she is on Plavix because of CVA and cannot stop. She has CKI stage V but does not want HD. She has baseline dysarthria, and gait problems, from OA . She is having PT at home and has a Therapist, sports three times a week, bp has been good at home, she is eating throughout the day, snaking on chips, cookies, and whatever she wants. She seems to be happy and comfortable. Daughter cares for her at home.    Patient Active Problem List   Diagnosis Date Noted  . Malnutrition of moderate degree 02/28/2017  . Altered mental status 02/26/2017  . Dysarthria due to recent cerebrovascular accident (CVA) 06/23/2016  . Hemiparesis affecting right side as late effect of cerebrovascular accident (CVA) ([place]) 06/23/2016  . Hypotension 06/23/2016  . CKD (chronic kidney disease) stage 5, GFR less than 15 ml/min (HCC) 06/23/2016  . Anemia of chronic disease 06/23/2016  . Encephalopathy, metabolic 70/35/0093  . Intermittent confusion 06/21/2016  . Senile purpura ([place]) 05/13/2016  . Hiatal hernia 05/04/2016  . Thoracic aorta atherosclerosis ([place]) 05/04/2016  . Chronic stable angina ([place]) 10/16/2015  . Secondary hyperparathyroidism ([place]) 04/13/2015  . Arteriosclerosis of coronary artery 04/08/2015  . Benign hypertension 04/08/2015  . Chronic kidney disease (CKD), stage V ([place]) 04/08/2015  . Controlled gout 04/08/2015  . Dyslipidemia 04/08/2015  . History of peptic ulcer disease 04/08/2015  . Adult  hypothyroidism 04/08/2015  . Mild cognitive disorder 04/08/2015  . Anemia of chronic disease 04/08/2015  . Osteoarthrosis involving more than one site but not generalized 04/08/2015  . Osteoporosis 04/08/2015  . Pelvic muscle wasting 04/08/2015  . Allergic rhinitis 04/08/2015  . Tobacco use 04/08/2015  . CCF (congestive cardiac failure) ([place]) 03/29/2014  . HLD (hyperlipidemia) 03/29/2014  . H/O gastrointestinal hemorrhage 04/05/2007    Past Surgical History:  Procedure Laterality Date  . EXPLORATORY LAPAROTOMY    . EYE SURGERY Bilateral    cataract    Family History  Problem Relation Age of Onset  . Diabetes Sister   . Hypertension Sister     Social History   Social History  . Marital status: Widowed    Spouse name: N/A  . Number of children: N/A  . Years of education: N/A   Occupational History  . Not on file.   Social History Main Topics  . Smoking status: Never Smoker  . Smokeless tobacco: Current User    Types: Snuff  . Alcohol use No  . Drug use: No  . Sexual activity: No   Other Topics Concern  . Not on file   Social History Narrative   She lives with her daughter.    She also has her nephew and niece at home.    Had a stroke October 2017 and has difficulty walking and also unable to talk since.          Current Outpatient Prescriptions:  .  allopurinol (ZYLOPRIM) 100 MG tablet,  Take 1 tablet (100 mg total) by mouth daily., Disp: 90 tablet, Rfl: 2 .  Cholecalciferol (VITAMIN D3) 2000 UNITS capsule, Take 1 capsule by mouth daily., Disp: , Rfl:  .  clopidogrel (PLAVIX) 75 MG tablet, Take 1 tablet (75 mg total) by mouth daily., Disp: 90 tablet, Rfl: 1 .  feeding supplement, ENSURE ENLIVE, (ENSURE ENLIVE) LIQD, Take 237 mLs by mouth 3 (three) times daily between meals., Disp: 237 mL, Rfl: 12 .  ferrous sulfate 325 (65 FE) MG tablet, Take 325 mg by mouth 2 (two) times daily with a meal., Disp: , Rfl:  .  furosemide (LASIX) 40 MG tablet, TAKE 1 TABLET (40  MG TOTAL) BY MOUTH DAILY., Disp: 90 tablet, Rfl: 1 .  isosorbide mononitrate (IMDUR) 60 MG 24 hr tablet, Take 1 tablet (60 mg total) by mouth daily., Disp: 90 tablet, Rfl: 2 .  levothyroxine (SYNTHROID, LEVOTHROID) 75 MCG tablet, TAKE 1 TABLET (75 MCG TOTAL) BY MOUTH DAILY AT 6 (SIX) AM., Disp: 90 tablet, Rfl: 1 .  pantoprazole (PROTONIX) 40 MG tablet, Take 1 tablet (40 mg total) by mouth daily., Disp: 90 tablet, Rfl: 1 .  raloxifene (EVISTA) 60 MG tablet, TAKE 1 TABLET BY MOUTH EVERY DAY, Disp: 90 tablet, Rfl: 2 .  rosuvastatin (CRESTOR) 10 MG tablet, TAKE 1 TABLET (10 MG TOTAL) BY MOUTH DAILY., Disp: 90 tablet, Rfl: 2 .  senna-docusate (SENOKOT-S) 8.6-50 MG tablet, Take 1 tablet by mouth at bedtime as needed for mild constipation or moderate constipation., Disp: 30 tablet, Rfl: 5 .  sodium bicarbonate 650 MG tablet, TAKE 2 TABLETS (1,300 MG TOTAL) BY MOUTH TWO (2) TIMES A DAY., Disp: , Rfl: 11  No Known Allergies   ROS  Constitutional: Negative for fever, positive for mild  weight change.  Respiratory: Negative for cough and shortness of breath.   Cardiovascular: Negative for chest pain or palpitations.  Gastrointestinal: Negative for abdominal pain, no bowel changes.  Musculoskeletal: Positive  for gait problem and  joint swelling.  Skin: Negative for rash. She has purpura  Neurological: Negative for dizziness or headache.  No other specific complaints in a complete review of systems (except as listed in HPI above).  Objective  Vitals:   03/13/17 1051  BP: 134/62  Pulse: 67  Resp: 16  Temp: (!) 97.3 F (36.3 C)  TempSrc: Oral  SpO2: 93%  Weight: 102 lb 3.2 oz (46.4 kg)  Height: 5' (1.524 m)    Body mass index is 19.96 kg/m.  Physical Exam  Constitutional: Patient appears alert. No distress. Seems comfortable  HEENT: head atraumatic, normocephalic, pupils equal and reactive to light, neck supple, throat within normal limits Cardiovascular: Normal rate, regular rhythm  and normal heart sounds. No murmur heard. No BLE edema. Pulmonary/Chest: Effort normal and breath sounds normal. No respiratory distress. Abdominal: Soft. There is no tenderness. Skin: senile purpura Psychiatric: Patient has a normal mood and affect. Dysarthria from stroke Muscular Skeletal: crepitus both knees, decrease extension right knee, she came in on a wheelchair today, antalgic gait Neurologist: cranial nerves intact, cooperative  Recent Results (from the past 2160 hour(s))  CBC with Differential     Status: Abnormal   Collection Time: 01/23/17  6:42 PM  Result Value Ref Range   WBC 5.6 3.6 - 11.0 K/uL   RBC 3.26 (L) 3.80 - 5.20 MIL/uL   Hemoglobin 8.3 (L) 12.0 - 16.0 g/dL   HCT 26.8 (L) 35.0 - 47.0 %   MCV 82.1 80.0 - 100.0 fL  MCH 25.5 (L) 26.0 - 34.0 pg   MCHC 31.1 (L) 32.0 - 36.0 g/dL   RDW 16.7 (H) 11.5 - 14.5 %   Platelets 244 150 - 440 K/uL   Neutrophils Relative % 62 %   Neutro Abs 3.5 1.4 - 6.5 K/uL   Lymphocytes Relative 26 %   Lymphs Abs 1.5 1.0 - 3.6 K/uL   Monocytes Relative 8 %   Monocytes Absolute 0.5 0.2 - 0.9 K/uL   Eosinophils Relative 3 %   Eosinophils Absolute 0.1 0 - 0.7 K/uL   Basophils Relative 1 %   Basophils Absolute 0.1 0 - 0.1 K/uL  Comprehensive metabolic panel     Status: Abnormal   Collection Time: 01/23/17  6:42 PM  Result Value Ref Range   Sodium 140 135 - 145 mmol/L   Potassium 4.1 3.5 - 5.1 mmol/L   Chloride 108 101 - 111 mmol/L   CO2 25 22 - 32 mmol/L   Glucose, Bld 108 (H) 65 - 99 mg/dL   BUN 43 (H) 6 - 20 mg/dL   Creatinine, Ser 3.34 (H) 0.44 - 1.00 mg/dL   Calcium 8.7 (L) 8.9 - 10.3 mg/dL   Total Protein 7.1 6.5 - 8.1 g/dL   Albumin 3.7 3.5 - 5.0 g/dL   AST 18 15 - 41 U/L   ALT 11 (L) 14 - 54 U/L   Alkaline Phosphatase 63 38 - 126 U/L   Total Bilirubin 0.3 0.3 - 1.2 mg/dL   GFR calc non Af Amer 11 (L) >60 mL/min   GFR calc Af Amer 13 (L) >60 mL/min    Comment: (NOTE) The eGFR has been calculated using the CKD EPI  equation. This calculation has not been validated in all clinical situations. eGFR's persistently <60 mL/min signify possible Chronic Kidney Disease.    Anion gap 7 5 - 15  Troponin I     Status: None   Collection Time: 01/23/17  6:42 PM  Result Value Ref Range   Troponin I <0.03 <0.03 ng/mL  Urinalysis, Complete w Microscopic     Status: Abnormal   Collection Time: 01/23/17  7:35 PM  Result Value Ref Range   Color, Urine STRAW (A) YELLOW   APPearance CLEAR (A) CLEAR   Specific Gravity, Urine 1.006 1.005 - 1.030   pH 7.0 5.0 - 8.0   Glucose, UA NEGATIVE NEGATIVE mg/dL   Hgb urine dipstick NEGATIVE NEGATIVE   Bilirubin Urine NEGATIVE NEGATIVE   Ketones, ur NEGATIVE NEGATIVE mg/dL   Protein, ur NEGATIVE NEGATIVE mg/dL   Nitrite NEGATIVE NEGATIVE   Leukocytes, UA MODERATE (A) NEGATIVE   RBC / HPF 0-5 0 - 5 RBC/hpf   WBC, UA 6-30 0 - 5 WBC/hpf   Bacteria, UA MANY (A) NONE SEEN   Squamous Epithelial / LPF 0-5 (A) NONE SEEN  Urine culture     Status: Abnormal   Collection Time: 01/23/17  7:35 PM  Result Value Ref Range   Specimen Description URINE, CLEAN CATCH    Special Requests Normal    Culture MULTIPLE SPECIES PRESENT, SUGGEST RECOLLECTION (A)    Report Status 01/25/2017 FINAL   POCT urinalysis dipstick     Status: Abnormal   Collection Time: 01/27/17 11:46 AM  Result Value Ref Range   Color, UA yellow    Clarity, UA clear    Glucose, UA negative    Bilirubin, UA negative    Ketones, UA negative    Spec Grav, UA 1.010 1.010 - 1.025  Blood, UA negative    pH, UA 7.0 5.0 - 8.0   Protein, UA 30    Urobilinogen, UA negative (A) 0.2 or 1.0 E.U./dL   Nitrite, UA negative    Leukocytes, UA Negative Negative  Urine Culture     Status: None   Collection Time: 01/27/17 12:31 PM  Result Value Ref Range   Organism ID, Bacteria NO GROWTH   Comprehensive metabolic panel     Status: Abnormal   Collection Time: 02/26/17  4:40 PM  Result Value Ref Range   Sodium 140 135 - 145  mmol/L   Potassium 3.9 3.5 - 5.1 mmol/L   Chloride 108 101 - 111 mmol/L   CO2 23 22 - 32 mmol/L   Glucose, Bld 117 (H) 65 - 99 mg/dL   BUN 52 (H) 6 - 20 mg/dL   Creatinine, Ser 3.34 (H) 0.44 - 1.00 mg/dL   Calcium 8.7 (L) 8.9 - 10.3 mg/dL   Total Protein 6.7 6.5 - 8.1 g/dL   Albumin 3.2 (L) 3.5 - 5.0 g/dL   AST 25 15 - 41 U/L   ALT 12 (L) 14 - 54 U/L   Alkaline Phosphatase 59 38 - 126 U/L   Total Bilirubin 0.4 0.3 - 1.2 mg/dL   GFR calc non Af Amer 11 (L) >60 mL/min   GFR calc Af Amer 13 (L) >60 mL/min    Comment: (NOTE) The eGFR has been calculated using the CKD EPI equation. This calculation has not been validated in all clinical situations. eGFR's persistently <60 mL/min signify possible Chronic Kidney Disease.    Anion gap 9 5 - 15  Lactic acid, plasma     Status: None   Collection Time: 02/26/17  4:40 PM  Result Value Ref Range   Lactic Acid, Venous 1.8 0.5 - 1.9 mmol/L  Troponin I     Status: None   Collection Time: 02/26/17  4:40 PM  Result Value Ref Range   Troponin I <0.03 <0.03 ng/mL  Brain natriuretic peptide     Status: None   Collection Time: 02/26/17  4:40 PM  Result Value Ref Range   B Natriuretic Peptide 99.0 0.0 - 100.0 pg/mL  CBC with Differential     Status: Abnormal   Collection Time: 02/26/17  4:40 PM  Result Value Ref Range   WBC 6.5 3.6 - 11.0 K/uL   RBC 3.08 (L) 3.80 - 5.20 MIL/uL   Hemoglobin 7.7 (L) 12.0 - 16.0 g/dL   HCT 24.6 (L) 35.0 - 47.0 %   MCV 80.0 80.0 - 100.0 fL   MCH 24.9 (L) 26.0 - 34.0 pg   MCHC 31.1 (L) 32.0 - 36.0 g/dL   RDW 17.0 (H) 11.5 - 14.5 %   Platelets 268 150 - 440 K/uL   Neutrophils Relative % 67 %   Neutro Abs 4.4 1.4 - 6.5 K/uL   Lymphocytes Relative 23 %   Lymphs Abs 1.5 1.0 - 3.6 K/uL   Monocytes Relative 7 %   Monocytes Absolute 0.4 0.2 - 0.9 K/uL   Eosinophils Relative 2 %   Eosinophils Absolute 0.1 0 - 0.7 K/uL   Basophils Relative 1 %   Basophils Absolute 0.1 0 - 0.1 K/uL  Urinalysis, Complete w  Microscopic     Status: Abnormal   Collection Time: 02/26/17  6:20 PM  Result Value Ref Range   Color, Urine STRAW (A) YELLOW   APPearance CLEAR (A) CLEAR   Specific Gravity, Urine 1.009 1.005 - 1.030  pH 7.0 5.0 - 8.0   Glucose, UA NEGATIVE NEGATIVE mg/dL   Hgb urine dipstick NEGATIVE NEGATIVE   Bilirubin Urine NEGATIVE NEGATIVE   Ketones, ur NEGATIVE NEGATIVE mg/dL   Protein, ur 30 (A) NEGATIVE mg/dL   Nitrite NEGATIVE NEGATIVE   Leukocytes, UA NEGATIVE NEGATIVE   RBC / HPF 0-5 0 - 5 RBC/hpf   WBC, UA 0-5 0 - 5 WBC/hpf   Bacteria, UA NONE SEEN NONE SEEN   Squamous Epithelial / LPF 0-5 (A) NONE SEEN  Lactic acid, plasma     Status: Abnormal   Collection Time: 02/26/17  9:18 PM  Result Value Ref Range   Lactic Acid, Venous 2.1 (HH) 0.5 - 1.9 mmol/L    Comment: CRITICAL RESULT CALLED TO, READ BACK BY AND VERIFIED WITH SILVIA FUENTES AT 2214 02/26/2017 BY TFK.   Troponin I     Status: None   Collection Time: 02/26/17  9:18 PM  Result Value Ref Range   Troponin I <0.03 <0.03 ng/mL  Vitamin B12     Status: None   Collection Time: 02/26/17  9:18 PM  Result Value Ref Range   Vitamin B-12 253 180 - 914 pg/mL    Comment: (NOTE) This assay is not validated for testing neonatal or myeloproliferative syndrome specimens for Vitamin B12 levels. Performed at [facility], [place] [address] [place], [place] [address]   Iron and TIBC     Status: Abnormal   Collection Time: 02/26/17  9:18 PM  Result Value Ref Range   Iron 15 (L) 28 - 170 ug/dL   TIBC 372 250 - 450 ug/dL   Saturation Ratios 4 (L) 10.4 - 31.8 %   UIBC 357 ug/dL  Ferritin     Status: Abnormal   Collection Time: 02/26/17  9:18 PM  Result Value Ref Range   Ferritin 6 (L) 11 - 307 ng/mL  CBC     Status: Abnormal   Collection Time: 02/27/17  6:33 AM  Result Value Ref Range   WBC 5.3 3.6 - 11.0 K/uL   RBC 2.51 (L) 3.80 - 5.20 MIL/uL   Hemoglobin 6.2 (L) 12.0 - 16.0 g/dL   HCT 19.5 (L) 35.0 - 47.0 %   MCV 77.6 (L)  80.0 - 100.0 fL   MCH 24.7 (L) 26.0 - 34.0 pg   MCHC 31.8 (L) 32.0 - 36.0 g/dL   RDW 16.6 (H) 11.5 - 14.5 %   Platelets 238 150 - 440 K/uL  Basic metabolic panel     Status: Abnormal   Collection Time: 02/27/17  6:33 AM  Result Value Ref Range   Sodium 140 135 - 145 mmol/L   Potassium 4.2 3.5 - 5.1 mmol/L   Chloride 110 101 - 111 mmol/L   CO2 24 22 - 32 mmol/L   Glucose, Bld 103 (H) 65 - 99 mg/dL   BUN 53 (H) 6 - 20 mg/dL   Creatinine, Ser 3.34 (H) 0.44 - 1.00 mg/dL   Calcium 8.1 (L) 8.9 - 10.3 mg/dL   GFR calc non Af Amer 11 (L) >60 mL/min   GFR calc Af Amer 13 (L) >60 mL/min    Comment: (NOTE) The eGFR has been calculated using the CKD EPI equation. This calculation has not been validated in all clinical situations. eGFR's persistently <60 mL/min signify possible Chronic Kidney Disease.    Anion gap 6 5 - 15  ABO/Rh     Status: None   Collection Time: 02/27/17  6:33 AM  Result  Value Ref Range   ABO/RH(D) A POS   Type and screen [place]     Status: None   Collection Time: 02/27/17  8:34 AM  Result Value Ref Range   ABO/RH(D) A POS    Antibody Screen NEG    Sample Expiration 03/02/2017    Unit Number [id number]    Blood Component Type RED CELLS,LR    Unit division 00    Status of Unit ISSUED,FINAL    Transfusion Status OK TO TRANSFUSE    Crossmatch Result Compatible   Prepare RBC     Status: None   Collection Time: 02/27/17  8:34 AM  Result Value Ref Range   Order Confirmation ORDER PROCESSED BY BLOOD BANK   BPAM RBC     Status: None   Collection Time: 02/27/17  8:34 AM  Result Value Ref Range   ISSUE DATE / TIME [id number]    Blood Product Unit Number [id number]    PRODUCT CODE [id number]    Unit Type and Rh 6200    Blood Product Expiration Date [id number]   Hemoglobin and hematocrit, blood     Status: Abnormal   Collection Time: 02/27/17  6:36 PM  Result Value Ref Range   Hemoglobin 8.6 (L) 12.0 - 16.0 g/dL   HCT 26.9 (L)  35.0 - 47.0 %      PHQ2/9: Depression screen [facility] 2/9 03/13/2017 06/26/2016 06/13/2016 05/13/2016 10/16/2015  Decreased Interest 0 0 0 0 0  Down, Depressed, Hopeless 0 0 0 0 0  PHQ - 2 Score 0 0 0 0 0     Fall Risk: Fall Risk  03/13/2017 06/26/2016 06/13/2016 05/13/2016 10/16/2015  Falls in the past year? Yes Yes Yes No No  Number falls in past yr: 2 or more 2 or more 2 or more - -  Injury with Fall? No No Yes - -  Risk Factor Category  - - High Fall Risk - -  Risk for fall due to : Impaired balance/gait - - - -  Follow up - - Falls evaluation completed - -     Assessment & Plan  1. Chronic kidney disease (CKD), stage V (HCC)  - COMPLETE METABOLIC PANEL WITH GFR - asked by Dr. Johnney Ou and we will send results to her  2. History of syncope  Hospitalized secondary to acute syncope on 02/26/2017, she had blood transfusion because of worsening of anemia, and was discharged home, she refuses HD  3. Senile purpura ([place])  Very think skin

## 2017-03-17 ENCOUNTER — Telehealth: Payer: Self-pay | Admitting: Family Medicine

## 2017-03-17 DIAGNOSIS — N390 Urinary tract infection, site not specified: Secondary | ICD-10-CM | POA: Diagnosis not present

## 2017-03-17 DIAGNOSIS — R4189 Other symptoms and signs involving cognitive functions and awareness: Secondary | ICD-10-CM | POA: Diagnosis not present

## 2017-03-17 NOTE — Telephone Encounter (Signed)
Crystal Faulkner from [facility]: pt has gained 8 pounds since visit here. Would like to know if he should be concerned? He see where congestive heart failure is mentioned in some paperwork. Is this a current diagnoses and should the weight gain be a concern. Please return call [phone]

## 2017-03-18 NOTE — Telephone Encounter (Signed)
She has a history of CHF. Is she having SOB, orthopnea, feeling more tired than usual? Pitting edema?

## 2017-03-18 NOTE — Telephone Encounter (Signed)
Crystal Faulkner stated that she has not been displaying any of those symptoms and was just concerned if CHF was a current diagnosis

## 2017-03-20 DIAGNOSIS — N390 Urinary tract infection, site not specified: Secondary | ICD-10-CM | POA: Diagnosis not present

## 2017-03-20 DIAGNOSIS — R4189 Other symptoms and signs involving cognitive functions and awareness: Secondary | ICD-10-CM | POA: Diagnosis not present

## 2017-03-24 ENCOUNTER — Telehealth: Payer: Self-pay

## 2017-03-24 DIAGNOSIS — N390 Urinary tract infection, site not specified: Secondary | ICD-10-CM | POA: Diagnosis not present

## 2017-03-24 DIAGNOSIS — R4189 Other symptoms and signs involving cognitive functions and awareness: Secondary | ICD-10-CM | POA: Diagnosis not present

## 2017-03-24 DIAGNOSIS — M79671 Pain in right foot: Secondary | ICD-10-CM | POA: Diagnosis not present

## 2017-03-24 DIAGNOSIS — R296 Repeated falls: Secondary | ICD-10-CM | POA: Diagnosis not present

## 2017-03-24 DIAGNOSIS — M25561 Pain in right knee: Secondary | ICD-10-CM | POA: Diagnosis not present

## 2017-03-24 DIAGNOSIS — M170 Bilateral primary osteoarthritis of knee: Secondary | ICD-10-CM | POA: Diagnosis not present

## 2017-03-24 NOTE — Telephone Encounter (Signed)
Patient daughter [name] called and states patient condition is steady worsen she has fallen at home 5 times since her visit with Dr. Ancil Boozer. She has fallen with her Niece twice last week. Then three times today and her daughter wants advice what to do. States her knees are buckling and she is falling. Please advise.

## 2017-03-25 ENCOUNTER — Telehealth: Payer: Self-pay

## 2017-03-25 ENCOUNTER — Other Ambulatory Visit: Payer: Self-pay | Admitting: Family Medicine

## 2017-03-25 DIAGNOSIS — N390 Urinary tract infection, site not specified: Secondary | ICD-10-CM | POA: Diagnosis not present

## 2017-03-25 DIAGNOSIS — I208 Other forms of angina pectoris: Secondary | ICD-10-CM

## 2017-03-25 DIAGNOSIS — W19XXXA Unspecified fall, initial encounter: Secondary | ICD-10-CM

## 2017-03-25 DIAGNOSIS — R4189 Other symptoms and signs involving cognitive functions and awareness: Secondary | ICD-10-CM | POA: Diagnosis not present

## 2017-03-25 DIAGNOSIS — S92901A Unspecified fracture of right foot, initial encounter for closed fracture: Secondary | ICD-10-CM | POA: Diagnosis not present

## 2017-03-25 DIAGNOSIS — Q663 Other congenital varus deformities of feet: Secondary | ICD-10-CM | POA: Diagnosis not present

## 2017-03-25 DIAGNOSIS — M170 Bilateral primary osteoarthritis of knee: Secondary | ICD-10-CM | POA: Diagnosis not present

## 2017-03-25 NOTE — Telephone Encounter (Signed)
PT suggested for patient to have a caregiver come sit with her a couple hours a day 5x weekly to help with baths. Could you put a referral in for the patient. Patient has a hairline fracture and needs help with moving around since she is very weak.

## 2017-03-25 NOTE — Telephone Encounter (Signed)
Yes, we can order home health to do that, however that will not decrease her risk of falls.  Can it be a verbal order?

## 2017-03-25 NOTE — Telephone Encounter (Signed)
Routed to the wrong [employer]

## 2017-03-25 NOTE — Telephone Encounter (Signed)
Is she having home health and PT going to her house? Consider placing her in a nursing home?

## 2017-03-25 NOTE — Telephone Encounter (Signed)
Spoke with her daughter [name] they are at the Orthopedic doctor now since she fractured her right foot from a fall. She is seeing a PA at the present moment and will call us back. I asked the daughter if she had Physical Therapy and Home Health implementing care for her mother now and she confirmed they already came out and take care of her mother. But they are unable to do anymore than they already are. I informed them Dr. Ancil Boozer talked about possiblity of a nursing home and they are going to check on it and talk to her other sisters regarding it.

## 2017-03-25 NOTE — Telephone Encounter (Signed)
Patient requesting refill of Isosorbide to [facility].

## 2017-03-25 NOTE — Telephone Encounter (Signed)
Pt had a fall yesterday with bruising to her rt knee and daughter was taking her to ortho for a check up according to Pia Mau from [place] and she has not heard from ortho at the time of this call

## 2017-03-26 NOTE — Telephone Encounter (Signed)
Did you do this?

## 2017-03-27 NOTE — Telephone Encounter (Signed)
They will need a referral for home health it can not be a verbal. Put the referral in this work cue. Could you please sign off on the referral. Thanks

## 2017-03-28 ENCOUNTER — Other Ambulatory Visit: Payer: Self-pay | Admitting: Family Medicine

## 2017-03-28 DIAGNOSIS — M109 Gout, unspecified: Secondary | ICD-10-CM

## 2017-03-31 DIAGNOSIS — N390 Urinary tract infection, site not specified: Secondary | ICD-10-CM | POA: Diagnosis not present

## 2017-03-31 DIAGNOSIS — R4189 Other symptoms and signs involving cognitive functions and awareness: Secondary | ICD-10-CM | POA: Diagnosis not present

## 2017-04-01 ENCOUNTER — Telehealth: Payer: Self-pay

## 2017-04-01 DIAGNOSIS — R4189 Other symptoms and signs involving cognitive functions and awareness: Secondary | ICD-10-CM | POA: Diagnosis not present

## 2017-04-01 DIAGNOSIS — N390 Urinary tract infection, site not specified: Secondary | ICD-10-CM | POA: Diagnosis not present

## 2017-04-01 NOTE — Telephone Encounter (Signed)
Alvis Lemmings called to inform you that pt is being discharged from nursing visits but will still have PT

## 2017-04-03 ENCOUNTER — Other Ambulatory Visit: Payer: Self-pay | Admitting: Family Medicine

## 2017-04-03 DIAGNOSIS — M109 Gout, unspecified: Secondary | ICD-10-CM

## 2017-04-03 NOTE — Telephone Encounter (Signed)
Patient requesting refill of [facility]urinol to [facility].

## 2017-04-04 DIAGNOSIS — N390 Urinary tract infection, site not specified: Secondary | ICD-10-CM | POA: Diagnosis not present

## 2017-04-04 DIAGNOSIS — R4189 Other symptoms and signs involving cognitive functions and awareness: Secondary | ICD-10-CM | POA: Diagnosis not present

## 2017-04-05 ENCOUNTER — Other Ambulatory Visit: Payer: Self-pay | Admitting: Family Medicine

## 2017-04-05 DIAGNOSIS — M109 Gout, unspecified: Secondary | ICD-10-CM

## 2017-04-06 NOTE — Telephone Encounter (Signed)
Patient requesting refill of [facility]urinol to [facility].

## 2017-04-09 DIAGNOSIS — R4189 Other symptoms and signs involving cognitive functions and awareness: Secondary | ICD-10-CM | POA: Diagnosis not present

## 2017-04-09 DIAGNOSIS — N390 Urinary tract infection, site not specified: Secondary | ICD-10-CM | POA: Diagnosis not present

## 2017-04-13 ENCOUNTER — Telehealth: Payer: Self-pay | Admitting: Family Medicine

## 2017-04-13 NOTE — Telephone Encounter (Signed)
Pts daughter called and she would like a call back about her moms PT for her foot. Crystal Faulkner # is [phone] or [phone]

## 2017-04-14 NOTE — Telephone Encounter (Signed)
Left voicemail for daughter to return my call

## 2017-04-29 ENCOUNTER — Other Ambulatory Visit: Payer: Self-pay | Admitting: Family Medicine

## 2017-04-29 DIAGNOSIS — I2510 Atherosclerotic heart disease of native coronary artery without angina pectoris: Secondary | ICD-10-CM

## 2017-04-29 DIAGNOSIS — E785 Hyperlipidemia, unspecified: Secondary | ICD-10-CM

## 2017-04-29 NOTE — Telephone Encounter (Signed)
Patient requesting refill of Crestor to [facility]. 

## 2017-05-01 ENCOUNTER — Telehealth: Payer: Self-pay | Admitting: Family Medicine

## 2017-05-05 ENCOUNTER — Other Ambulatory Visit: Payer: Self-pay | Admitting: Family Medicine

## 2017-05-05 DIAGNOSIS — E785 Hyperlipidemia, unspecified: Secondary | ICD-10-CM

## 2017-05-05 DIAGNOSIS — I2510 Atherosclerotic heart disease of native coronary artery without angina pectoris: Secondary | ICD-10-CM

## 2017-05-05 NOTE — Telephone Encounter (Signed)
Patient requesting refill of Crestor to [facility]. 

## 2017-05-07 DIAGNOSIS — R296 Repeated falls: Secondary | ICD-10-CM | POA: Diagnosis not present

## 2017-05-07 DIAGNOSIS — G309 Alzheimer's disease, unspecified: Secondary | ICD-10-CM | POA: Diagnosis not present

## 2017-05-07 DIAGNOSIS — F0280 Dementia in other diseases classified elsewhere without behavioral disturbance: Secondary | ICD-10-CM | POA: Diagnosis not present

## 2017-05-07 DIAGNOSIS — F0150 Vascular dementia without behavioral disturbance: Secondary | ICD-10-CM | POA: Diagnosis not present

## 2017-05-07 DIAGNOSIS — R410 Disorientation, unspecified: Secondary | ICD-10-CM | POA: Diagnosis not present

## 2017-05-07 DIAGNOSIS — R29898 Other symptoms and signs involving the musculoskeletal system: Secondary | ICD-10-CM | POA: Diagnosis not present

## 2017-05-07 DIAGNOSIS — R471 Dysarthria and anarthria: Secondary | ICD-10-CM | POA: Diagnosis not present

## 2017-05-19 ENCOUNTER — Ambulatory Visit: Payer: Self-pay | Admitting: Family Medicine

## 2017-06-08 DIAGNOSIS — E7800 Pure hypercholesterolemia, unspecified: Secondary | ICD-10-CM | POA: Diagnosis not present

## 2017-06-08 DIAGNOSIS — I2510 Atherosclerotic heart disease of native coronary artery without angina pectoris: Secondary | ICD-10-CM | POA: Diagnosis not present

## 2017-06-08 DIAGNOSIS — I10 Essential (primary) hypertension: Secondary | ICD-10-CM | POA: Diagnosis not present

## 2017-06-08 DIAGNOSIS — N184 Chronic kidney disease, stage 4 (severe): Secondary | ICD-10-CM | POA: Diagnosis not present

## 2017-06-08 DIAGNOSIS — R0602 Shortness of breath: Secondary | ICD-10-CM | POA: Diagnosis not present

## 2017-06-08 DIAGNOSIS — I63311 Cerebral infarction due to thrombosis of right middle cerebral artery: Secondary | ICD-10-CM | POA: Diagnosis not present

## 2017-06-08 DIAGNOSIS — I214 Non-ST elevation (NSTEMI) myocardial infarction: Secondary | ICD-10-CM | POA: Diagnosis not present

## 2017-06-15 ENCOUNTER — Encounter: Payer: Self-pay | Admitting: Family Medicine

## 2017-06-15 ENCOUNTER — Ambulatory Visit (INDEPENDENT_AMBULATORY_CARE_PROVIDER_SITE_OTHER): Payer: Medicare Other | Admitting: Family Medicine

## 2017-06-15 VITALS — BP 130/74 | HR 65 | Temp 98.0°F | Resp 16 | Ht 60.0 in | Wt 106.8 lb

## 2017-06-15 DIAGNOSIS — Z23 Encounter for immunization: Secondary | ICD-10-CM

## 2017-06-15 DIAGNOSIS — N185 Chronic kidney disease, stage 5: Secondary | ICD-10-CM | POA: Diagnosis not present

## 2017-06-15 DIAGNOSIS — I69351 Hemiplegia and hemiparesis following cerebral infarction affecting right dominant side: Secondary | ICD-10-CM

## 2017-06-15 DIAGNOSIS — E785 Hyperlipidemia, unspecified: Secondary | ICD-10-CM

## 2017-06-15 DIAGNOSIS — E038 Other specified hypothyroidism: Secondary | ICD-10-CM

## 2017-06-15 DIAGNOSIS — M810 Age-related osteoporosis without current pathological fracture: Secondary | ICD-10-CM

## 2017-06-15 DIAGNOSIS — D692 Other nonthrombocytopenic purpura: Secondary | ICD-10-CM | POA: Diagnosis not present

## 2017-06-15 DIAGNOSIS — I208 Other forms of angina pectoris: Secondary | ICD-10-CM | POA: Diagnosis not present

## 2017-06-15 DIAGNOSIS — I209 Angina pectoris, unspecified: Secondary | ICD-10-CM

## 2017-06-15 DIAGNOSIS — I2510 Atherosclerotic heart disease of native coronary artery without angina pectoris: Secondary | ICD-10-CM | POA: Diagnosis not present

## 2017-06-15 DIAGNOSIS — N2581 Secondary hyperparathyroidism of renal origin: Secondary | ICD-10-CM | POA: Diagnosis not present

## 2017-06-15 DIAGNOSIS — E039 Hypothyroidism, unspecified: Secondary | ICD-10-CM

## 2017-06-15 DIAGNOSIS — I700 Atherosclerosis of aorta: Secondary | ICD-10-CM

## 2017-06-15 DIAGNOSIS — I69322 Dysarthria following cerebral infarction: Secondary | ICD-10-CM

## 2017-06-15 DIAGNOSIS — I2089 Other forms of angina pectoris: Secondary | ICD-10-CM

## 2017-06-15 MED ORDER — RALOXIFENE HCL 60 MG PO TABS: 60.0000 mg | ORAL_TABLET | Freq: Every day | ORAL | 2 refills | Status: DC

## 2017-06-15 MED ORDER — ROSUVASTATIN CALCIUM 10 MG PO TABS: 10.0000 mg | ORAL_TABLET | Freq: Every day | ORAL | 1 refills | Status: DC

## 2017-06-15 MED ORDER — FUROSEMIDE 40 MG PO TABS: 40.0000 mg | ORAL_TABLET | Freq: Every day | ORAL | 1 refills | Status: DC

## 2017-06-15 MED ORDER — LEVOTHYROXINE SODIUM 75 MCG PO TABS: 75.0000 ug | ORAL_TABLET | Freq: Every day | ORAL | 1 refills | Status: DC

## 2017-06-15 NOTE — Progress Notes (Signed)
Name: Crystal Faulkner   MRN: [id number]    DOB: [birth year]   Date:06/15/2017       Progress Note  Subjective  Chief Complaint  Chief Complaint  Patient presents with  . Medication Refill  . Chronic Kidney Disease  . Hypothyroidism  . Hypertension  . Gastroesophageal Reflux  . Mild Cognitive Impairment  . Gout    HPI  CVA :  She had a CVA back in October 2017, she has dysarthria , left side hemiparesis she uses a walker now.  Family has to crush her medications and mix in apple sauce. Personality seems the same. She is eating well again.   Hypothyroidism: taking medication daily as prescribed, no constipation, she has dry skin but controlled with lotion.   Senile purpura: bruises easily and takes aspirin, explained common to have it at her age  HTN: bp is well controlled, off bp medication still takes diuretic , sees nephrologist   Angina: taking Imdur daily and denies chest pain, also taking statin therapy .   GERD: under control, off Dexilant because of cost, prescription of pantoprazole should be at pharmacy to be picked up - daughter will confirm that she has been taking medication   Dementia: stable, no wondering, able to take shower, eats by herself, but needs help coming to the doctor and family member manages her money and dispense her medication. Neurologist offered to start on medication, but family prefers holding off on that   [place] with anemia or chronic disease: seeing nephrologist, refuses HD, no pruritus. She was hospitalized in July and had blood transfusion.   Controlled Gout: no recent episodes  Atherosclerosis aorta: taking aspirin and statin therapy  Patient Active Problem List   Diagnosis Date Noted  . Malnutrition of moderate degree 02/28/2017  . Altered mental status 02/26/2017  . Dysarthria due to recent cerebrovascular accident (CVA) 06/23/2016  . Hemiparesis affecting right side as late effect of cerebrovascular accident (CVA) ([place])  06/23/2016  . Hypotension 06/23/2016  . CKD (chronic kidney disease) stage 5, GFR less than 15 ml/min (HCC) 06/23/2016  . Anemia of chronic disease 06/23/2016  . Encephalopathy, metabolic 81/77/1165  . Intermittent confusion 06/21/2016  . Senile purpura ([place]) 05/13/2016  . Hiatal hernia 05/04/2016  . Thoracic aorta atherosclerosis ([place]) 05/04/2016  . Chronic stable angina ([place]) 10/16/2015  . Secondary hyperparathyroidism ([place]) 04/13/2015  . Arteriosclerosis of coronary artery 04/08/2015  . Benign hypertension 04/08/2015  . Chronic kidney disease (CKD), stage V ([place]) 04/08/2015  . Controlled gout 04/08/2015  . Dyslipidemia 04/08/2015  . History of peptic ulcer disease 04/08/2015  . Adult hypothyroidism 04/08/2015  . Mild cognitive disorder 04/08/2015  . Anemia of chronic disease 04/08/2015  . Osteoarthrosis involving more than one site but not generalized 04/08/2015  . Osteoporosis 04/08/2015  . Pelvic muscle wasting 04/08/2015  . Allergic rhinitis 04/08/2015  . Tobacco use 04/08/2015  . CHF (congestive heart failure) ([place]) 03/29/2014  . HLD (hyperlipidemia) 03/29/2014  . H/O gastrointestinal hemorrhage 04/05/2007    Past Surgical History:  Procedure Laterality Date  . EXPLORATORY LAPAROTOMY    . EYE SURGERY Bilateral    cataract    Family History  Problem Relation Age of Onset  . Diabetes Sister   . Hypertension Sister     Social History   Social History  . Marital status: Widowed    Spouse name: N/A  . Number of children: N/A  . Years of education: N/A   Occupational History  . Not on file.  Social History Main Topics  . Smoking status: Never Smoker  . Smokeless tobacco: Current User    Types: Snuff  . Alcohol use No  . Drug use: No  . Sexual activity: No   Other Topics Concern  . Not on file   Social History Narrative   She lives with her daughter.    She also has her nephew and niece at home.    Had a stroke October 2017 and has difficulty  walking and also unable to talk since.          Current Outpatient Prescriptions:  .  allopurinol (ZYLOPRIM) 100 MG tablet, TAKE 1 TABLET (100 MG TOTAL) BY MOUTH DAILY., Disp: 90 tablet, Rfl: 2 .  Cholecalciferol (VITAMIN D3) 2000 UNITS capsule, Take 1 capsule by mouth daily., Disp: , Rfl:  .  clopidogrel (PLAVIX) 75 MG tablet, Take 1 tablet (75 mg total) by mouth daily., Disp: 90 tablet, Rfl: 1 .  feeding supplement, ENSURE ENLIVE, (ENSURE ENLIVE) LIQD, Take 237 mLs by mouth 3 (three) times daily between meals., Disp: 237 mL, Rfl: 12 .  ferrous sulfate 325 (65 FE) MG tablet, Take 325 mg by mouth 2 (two) times daily with a meal., Disp: , Rfl:  .  furosemide (LASIX) 40 MG tablet, Take 1 tablet (40 mg total) by mouth daily., Disp: 90 tablet, Rfl: 1 .  isosorbide mononitrate (IMDUR) 60 MG 24 hr tablet, TAKE 1 TABLET (60 MG TOTAL) BY MOUTH DAILY., Disp: 90 tablet, Rfl: 2 .  levothyroxine (SYNTHROID, LEVOTHROID) 75 MCG tablet, Take 1 tablet (75 mcg total) by mouth daily at 6 (six) AM., Disp: 90 tablet, Rfl: 1 .  pantoprazole (PROTONIX) 40 MG tablet, Take 1 tablet (40 mg total) by mouth daily., Disp: 90 tablet, Rfl: 1 .  raloxifene (EVISTA) 60 MG tablet, Take 1 tablet (60 mg total) by mouth daily., Disp: 90 tablet, Rfl: 2 .  rosuvastatin (CRESTOR) 10 MG tablet, Take 1 tablet (10 mg total) by mouth daily., Disp: 90 tablet, Rfl: 1 .  senna-docusate (SENOKOT-S) 8.6-50 MG tablet, Take 1 tablet by mouth at bedtime as needed for mild constipation or moderate constipation., Disp: 30 tablet, Rfl: 5 .  sodium bicarbonate 650 MG tablet, TAKE 2 TABLETS (1,300 MG TOTAL) BY MOUTH TWO (2) TIMES A DAY., Disp: , Rfl: 11  No Known Allergies   ROS  Constitutional: Negative for fever or weight change.  Respiratory: Negative for cough and shortness of breath.   Cardiovascular: Negative for chest pain or palpitations.  Gastrointestinal: Negative for abdominal pain, no bowel changes.  Musculoskeletal: Positive for  gait problem but no  joint swelling.  Skin: Negative for rash.  Neurological: Negative for dizziness or headache.  No other specific complaints in a complete review of systems (except as listed in HPI above).  Objective  Vitals:   06/15/17 1153  BP: 130/74  Pulse: 65  Resp: 16  Temp: 98 F (36.7 C)  TempSrc: Oral  SpO2: 98%  Weight: 106 lb 12.8 oz (48.4 kg)  Height: 5' (1.524 m)    Body mass index is 20.86 kg/m.  Physical Exam  Constitutional: Patient appears well-developed and well-nourished. No distress.  HEENT: head atraumatic, normocephalic, pupils equal and reactive to light, neck supple, throat within normal limits Cardiovascular: Normal rate, regular rhythm and normal heart sounds.  No murmur heard. No BLE edema. Pulmonary/Chest: Effort normal and breath sounds normal. No respiratory distress. Abdominal: Soft.  There is no tenderness. Psychiatric: almost non verbal, but cooperative and  seems content  Muscular Skeletal: uses walker to assist with ambulation  PHQ2/9: Depression screen [facility] 2/9 06/15/2017 03/13/2017 06/26/2016 06/13/2016 05/13/2016  Decreased Interest 0 0 0 0 0  Down, Depressed, Hopeless 0 0 0 0 0  PHQ - 2 Score 0 0 0 0 0     Fall Risk: Fall Risk  06/15/2017 03/13/2017 06/26/2016 06/13/2016 05/13/2016  Falls in the past year? Yes Yes Yes Yes No  Number falls in past yr: 2 or more 2 or more 2 or more 2 or more -  Injury with Fall? Yes No No Yes -  Risk Factor Category  - - - High Fall Risk -  Risk for fall due to : - Impaired balance/gait - - -  Follow up - - - Falls evaluation completed -     Functional Status Survey: Is the patient deaf or have difficulty hearing?: Yes Does the patient have difficulty seeing, even when wearing glasses/contacts?: Yes Does the patient have difficulty concentrating, remembering, or making decisions?: Yes Does the patient have difficulty walking or climbing stairs?: Yes Does the patient have difficulty dressing or  bathing?: Yes Does the patient have difficulty doing errands alone such as visiting a doctor's office or shopping?: Yes    Assessment & Plan  1. Hemiparesis affecting right side as late effect of cerebrovascular accident (CVA) ([place])  - COMPLETE METABOLIC PANEL WITH GFR  2. Need for immunization against influenza  - Flu vaccine HIGH DOSE PF (Fluzone High dose) - Flu vaccine HIGH DOSE PF  3. Encounter for immunization  - Flu vaccine HIGH DOSE PF (Fluzone High dose)   4. Chronic stable angina (HCC)  - furosemide (LASIX) 40 MG tablet; Take 1 tablet (40 mg total) by mouth daily.  Dispense: 90 tablet; Refill: 1  5. Other specified hypothyroidism  - TSH  6. Arteriosclerosis of coronary artery  - rosuvastatin (CRESTOR) 10 MG tablet; Take 1 tablet (10 mg total) by mouth daily.  Dispense: 90 tablet; Refill: 1 - Lipid panel  7. Dysarthria as late effect of cerebrovascular accident (CVA)  Since 05/2016  8. Thoracic aorta atherosclerosis ([place])  On statin therapy and bp is controlled  9. Secondary hyperparathyroidism ([place])  Recheck labs  10. Senile purpura (HCC)   Still bruising, on plavix  11. Chronic kidney disease (CKD), stage V (HCC)  - furosemide (LASIX) 40 MG tablet; Take 1 tablet (40 mg total) by mouth daily.  Dispense: 90 tablet; Refill: 1 - VITAMIN D 25 Hydroxy (Vit-D Deficiency, Fractures) - CBC with Differential/Platelet - COMPLETE METABOLIC PANEL WITH GFR - Parathyroid hormone, intact (no Ca) - Urine Microalbumin w/creat. ratio  12. Adult hypothyroidism  - levothyroxine (SYNTHROID, LEVOTHROID) 75 MCG tablet; Take 1 tablet (75 mcg total) by mouth daily at 6 (six) AM.  Dispense: 90 tablet; Refill: 1  13. Dyslipidemia  - rosuvastatin (CRESTOR) 10 MG tablet; Take 1 tablet (10 mg total) by mouth daily.  Dispense: 90 tablet; Refill: 1  14. Osteoporosis without current pathological fracture, unspecified osteoporosis type  - raloxifene (EVISTA) 60 MG tablet;  Take 1 tablet (60 mg total) by mouth daily.  Dispense: 90 tablet; Refill: 2

## 2017-06-16 LAB — PARATHYROID HORMONE, INTACT (NO CA): PTH: 535 pg/mL — AB (ref 14–64)

## 2017-06-16 LAB — MICROALBUMIN / CREATININE URINE RATIO
Creatinine, Urine: 79 mg/dL (ref 20–275)
MICROALB/CREAT RATIO: 105 ug/mg{creat} — AB (ref <30)
Microalb, Ur: 8.3 mg/dL

## 2017-06-16 LAB — COMPLETE METABOLIC PANEL WITH GFR
AG RATIO: 1.3 (calc) (ref 1.0–2.5)
ALT: 6 U/L (ref 6–29)
AST: 12 U/L (ref 10–35)
Albumin: 3.7 g/dL (ref 3.6–5.1)
Alkaline phosphatase (APISO): 78 U/L (ref 33–130)
BILIRUBIN TOTAL: 0.3 mg/dL (ref 0.2–1.2)
BUN/Creatinine Ratio: 10 (calc) (ref 6–22)
BUN: 36 mg/dL — ABNORMAL HIGH (ref 7–25)
CHLORIDE: 106 mmol/L (ref 98–110)
CO2: 27 mmol/L (ref 20–32)
Calcium: 9.2 mg/dL (ref 8.6–10.4)
Creat: 3.49 mg/dL — ABNORMAL HIGH (ref 0.60–0.88)
GFR, Est African American: 13 mL/min/{1.73_m2} — ABNORMAL LOW (ref >=60)
GFR, Est Non African American: 11 mL/min/{1.73_m2} — ABNORMAL LOW (ref >=60)
Globulin: 2.8 g/dL (calc) (ref 1.9–3.7)
Glucose, Bld: 87 mg/dL (ref 65–99)
POTASSIUM: 4.1 mmol/L (ref 3.5–5.3)
SODIUM: 142 mmol/L (ref 135–146)
Total Protein: 6.5 g/dL (ref 6.1–8.1)

## 2017-06-16 LAB — CBC WITH DIFFERENTIAL/PLATELET
BASOS ABS: 67 {cells}/uL (ref 0–200)
Basophils Relative: 1.2 %
EOS ABS: 129 {cells}/uL (ref 15–500)
Eosinophils Relative: 2.3 %
HCT: 33.6 % — ABNORMAL LOW (ref 35.0–45.0)
Hemoglobin: 11 g/dL — ABNORMAL LOW (ref 11.7–15.5)
Lymphs Abs: 1501 cells/uL (ref 850–3900)
MCH: 29.1 pg (ref 27.0–33.0)
MCHC: 32.7 g/dL (ref 32.0–36.0)
MCV: 88.9 fL (ref 80.0–100.0)
MPV: 11.2 fL (ref 7.5–12.5)
Monocytes Relative: 6.6 %
NEUTROS PCT: 63.1 %
Neutro Abs: 3534 cells/uL (ref 1500–7800)
Platelets: 233 10*3/uL (ref 140–400)
RBC: 3.78 10*6/uL — ABNORMAL LOW (ref 3.80–5.10)
RDW: 14.5 % (ref 11.0–15.0)
TOTAL LYMPHOCYTE: 26.8 %
WBC: 5.6 10*3/uL (ref 3.8–10.8)
WBCMIX: 370 {cells}/uL (ref 200–950)

## 2017-06-16 LAB — LIPID PANEL
Cholesterol: 145 mg/dL (ref <200)
HDL: 60 mg/dL (ref >50)
LDL Cholesterol (Calc): 65 mg/dL (calc)
Non-HDL Cholesterol (Calc): 85 mg/dL (calc) (ref <130)
TRIGLYCERIDES: 117 mg/dL (ref <150)
Total CHOL/HDL Ratio: 2.4 (calc) (ref <5.0)

## 2017-06-16 LAB — TSH: TSH: 0.42 mIU/L (ref 0.40–4.50)

## 2017-06-16 LAB — VITAMIN D 25 HYDROXY (VIT D DEFICIENCY, FRACTURES): Vit D, 25-Hydroxy: 38 ng/mL (ref 30–100)

## 2017-06-19 ENCOUNTER — Other Ambulatory Visit: Payer: Self-pay | Admitting: Family Medicine

## 2017-06-19 NOTE — Telephone Encounter (Signed)
[facility]ill request for general medication:[facility]  Last office visit: 06/15/2017.

## 2017-06-22 DIAGNOSIS — N185 Chronic kidney disease, stage 5: Secondary | ICD-10-CM | POA: Diagnosis not present

## 2017-06-22 DIAGNOSIS — E872 Acidosis: Secondary | ICD-10-CM | POA: Diagnosis not present

## 2017-06-22 DIAGNOSIS — D638 Anemia in other chronic diseases classified elsewhere: Secondary | ICD-10-CM | POA: Diagnosis not present

## 2017-07-03 ENCOUNTER — Other Ambulatory Visit: Payer: Self-pay | Admitting: Family Medicine

## 2017-07-03 DIAGNOSIS — I69351 Hemiplegia and hemiparesis following cerebral infarction affecting right dominant side: Secondary | ICD-10-CM

## 2017-07-03 DIAGNOSIS — I69322 Dysarthria following cerebral infarction: Secondary | ICD-10-CM

## 2017-07-03 DIAGNOSIS — I208 Other forms of angina pectoris: Secondary | ICD-10-CM

## 2017-07-03 DIAGNOSIS — I2510 Atherosclerotic heart disease of native coronary artery without angina pectoris: Secondary | ICD-10-CM

## 2017-07-11 IMAGING — US US CAROTID DUPLEX BILAT
1 series · 14 of 24 positions shown · non-contrast
Comparison: None.

CLINICAL DATA: Stroke, CVA

EXAM:
BILATERAL CAROTID DUPLEX ULTRASOUND
TECHNIQUE: Gray scale imaging, color Doppler and duplex ultrasound were
performed of bilateral carotid and vertebral arteries in the neck.

[Series 1: us carotid duplex bilat · 0.06mm/px · 14 of 70 slices shown]
[im 1/70]
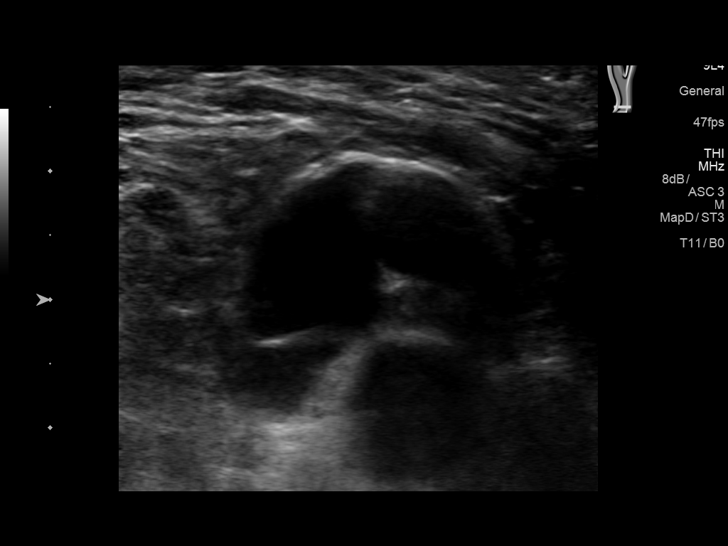
[im 7/70]
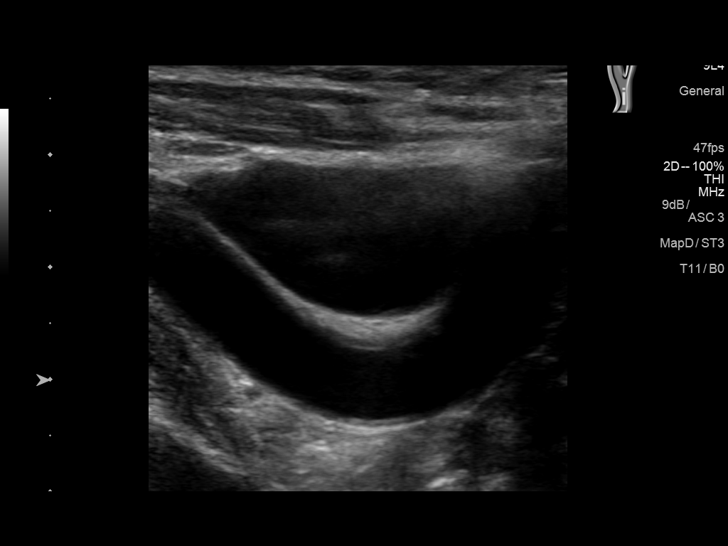
[im 13/70]
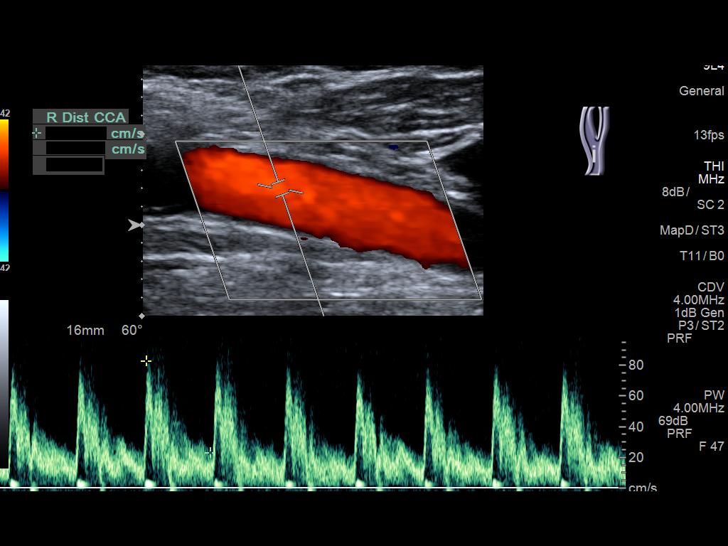
[im 19/70]
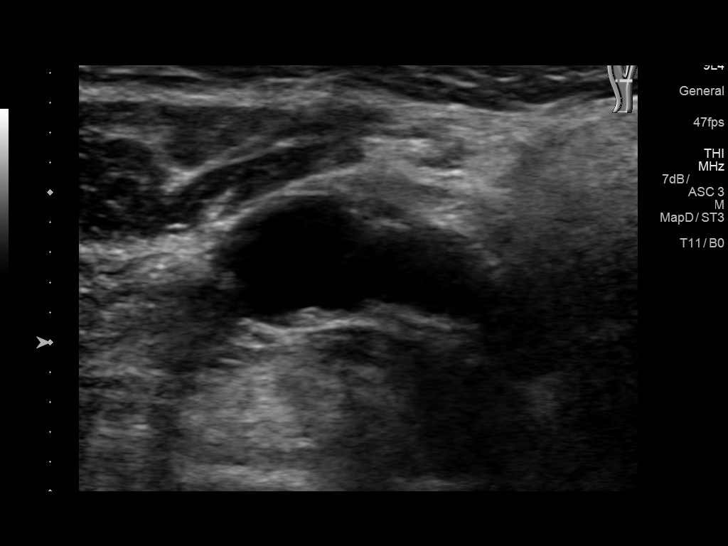
[im 22/70]
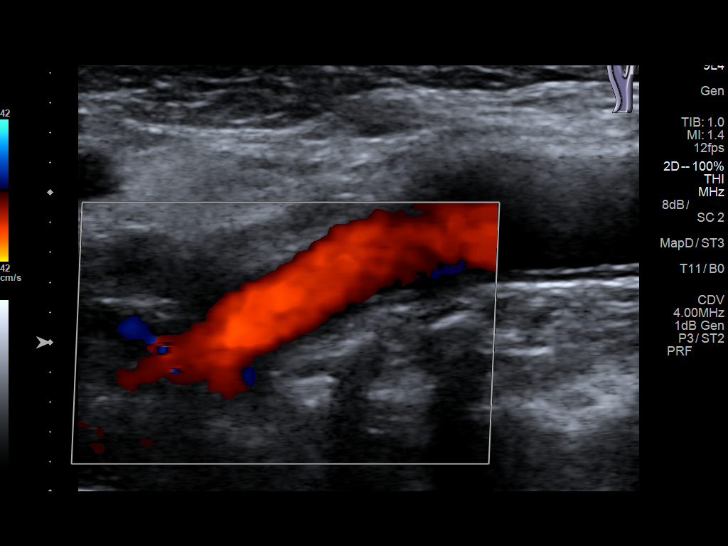
[im 28/70]
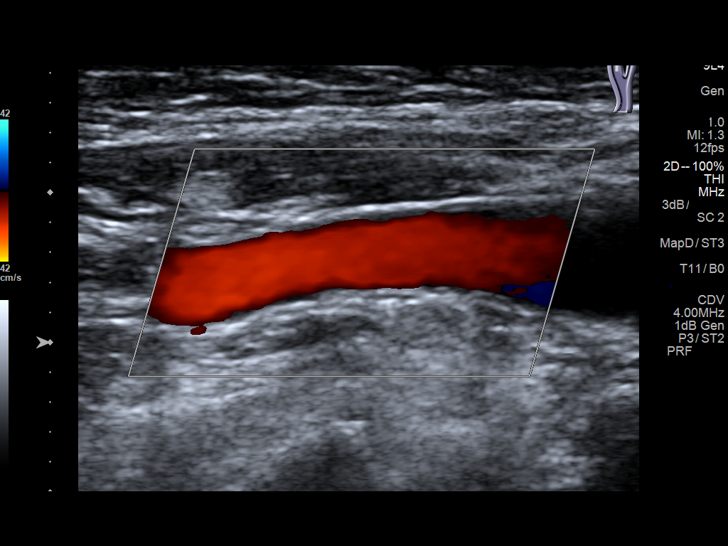
[im 34/70]
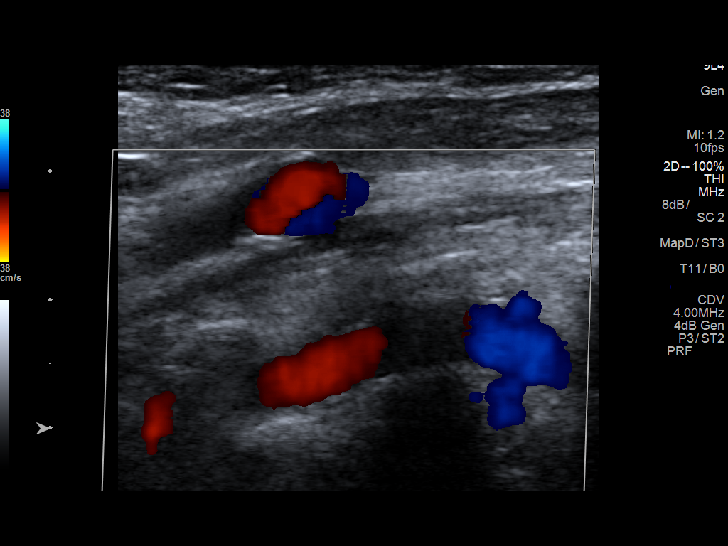
[im 37/70]
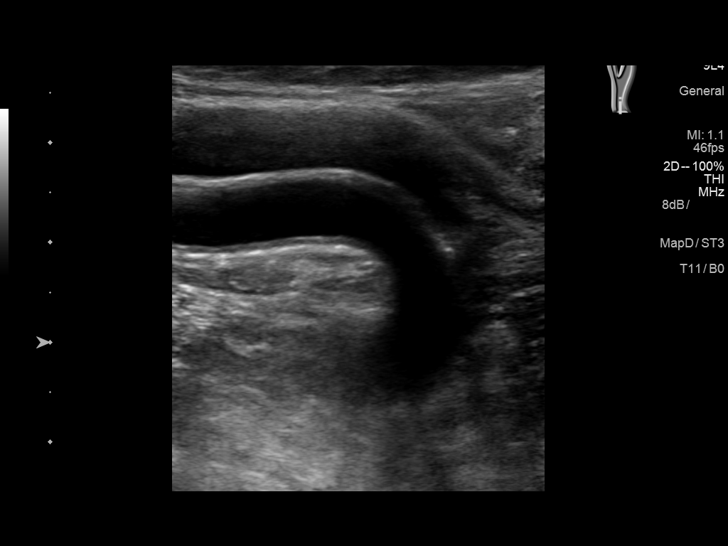
[im 43/70]
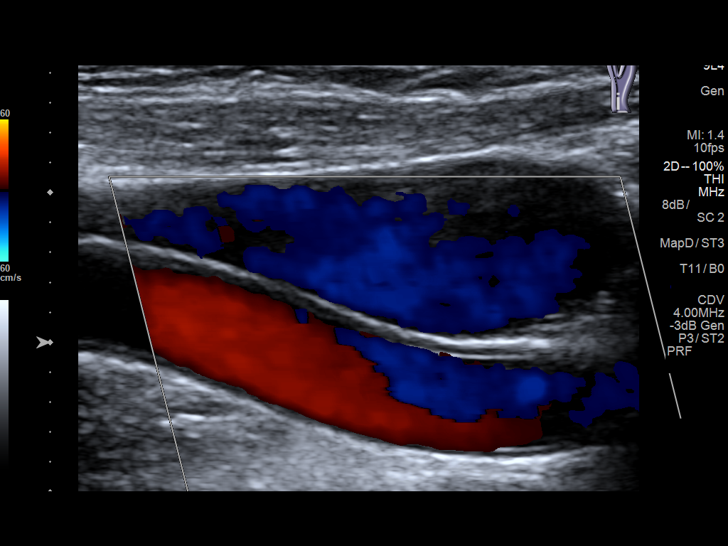
[im 49/70]
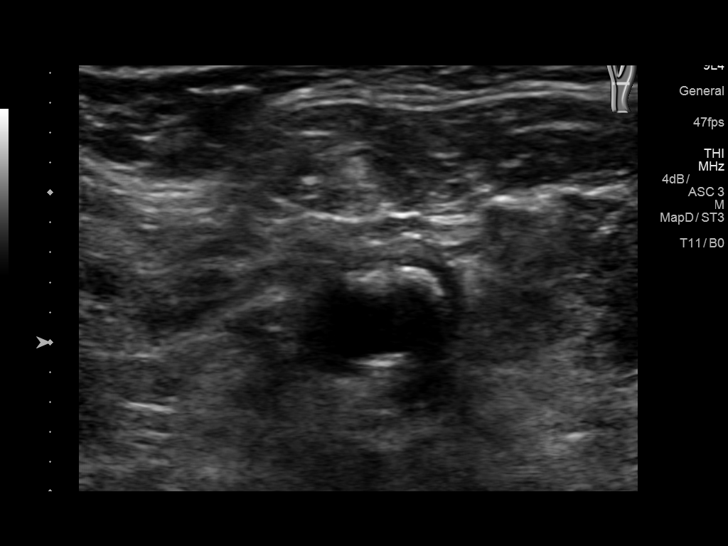
[im 55/70]
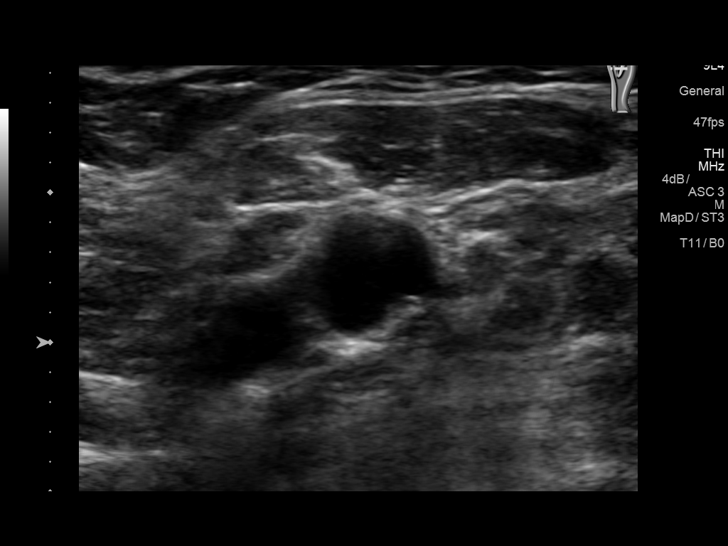
[im 58/70]
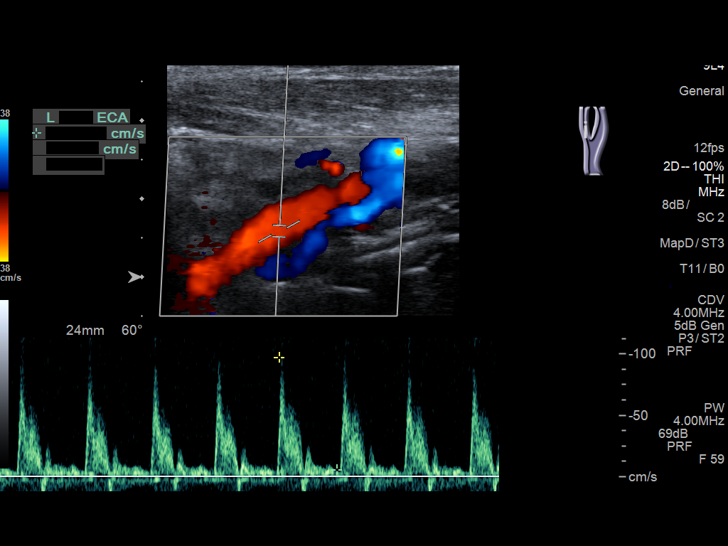
[im 64/70]
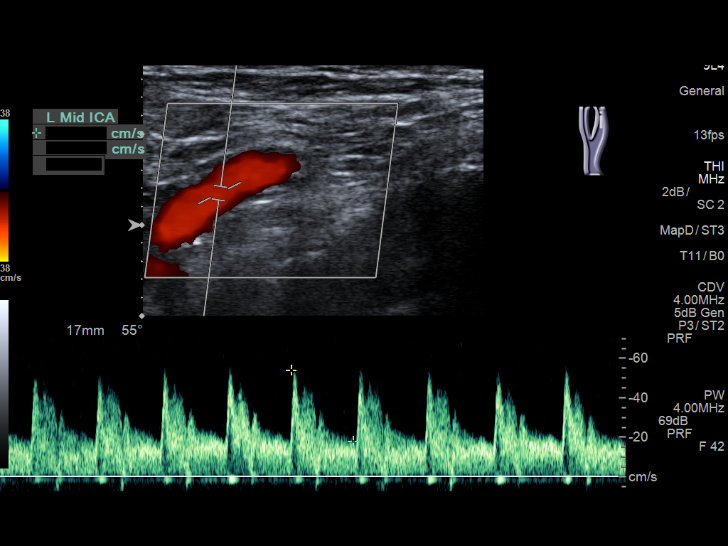
[im 70/70]
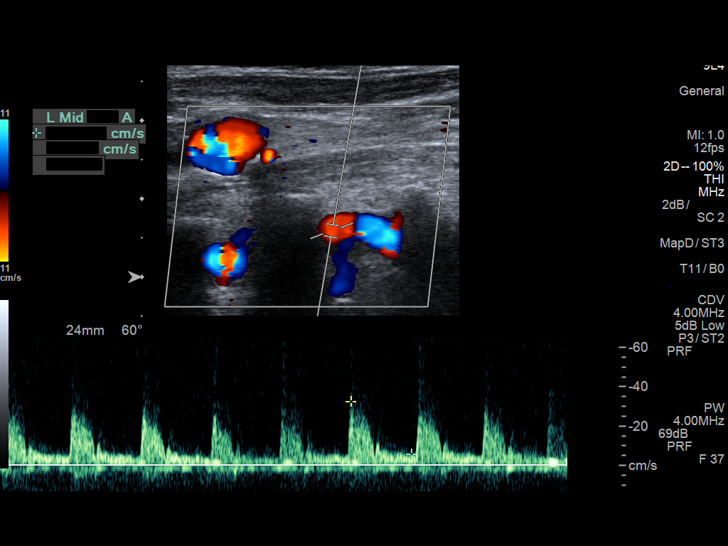

[14 of 24 positions shown; findings below may reference images not displayed]

FINDINGS: Criteria: Quantification of carotid stenosis is based on velocity
parameters that correlate the residual internal carotid diameter
with NASCET-based stenosis levels, using the diameter of the distal
internal carotid lumen as the denominator for stenosis measurement.

The following velocity measurements were obtained:

RIGHT

ICA:  92 cm/sec

CCA:  114 cm/sec

SYSTOLIC ICA/CCA RATIO:

DIASTOLIC ICA/CCA RATIO:

ECA:  103 cm/sec

LEFT

ICA:  54 cm/sec

CCA:  74 cm/sec

SYSTOLIC ICA/CCA RATIO:

DIASTOLIC ICA/CCA RATIO:

ECA:  97 cm/sec

RIGHT CAROTID ARTERY: Moderate calcified plaque in the bulb. Low
resistance internal carotid Doppler pattern

RIGHT VERTEBRAL ARTERY:  Antegrade

LEFT CAROTID ARTERY: Moderate calcified plaque in the bulb. Low
resistance internal carotid Doppler pattern.

LEFT VERTEBRAL ARTERY:  Antegrade
IMPRESSION: Less than 50% stenosis in the right and left internal carotid
arteries.

## 2017-08-11 DIAGNOSIS — M545 Low back pain: Secondary | ICD-10-CM | POA: Diagnosis not present

## 2017-08-11 DIAGNOSIS — M1711 Unilateral primary osteoarthritis, right knee: Secondary | ICD-10-CM | POA: Diagnosis not present

## 2017-08-11 DIAGNOSIS — M25571 Pain in right ankle and joints of right foot: Secondary | ICD-10-CM | POA: Diagnosis not present

## 2017-08-11 DIAGNOSIS — M25551 Pain in right hip: Secondary | ICD-10-CM | POA: Diagnosis not present

## 2017-09-15 ENCOUNTER — Ambulatory Visit: Payer: Self-pay | Admitting: Family Medicine

## 2017-09-16 ENCOUNTER — Ambulatory Visit: Payer: Self-pay | Admitting: Family Medicine

## 2017-09-22 ENCOUNTER — Ambulatory Visit: Payer: Self-pay | Admitting: Family Medicine

## 2017-10-06 NOTE — Telephone Encounter (Signed)
Visit resolved

## 2017-10-29 ENCOUNTER — Telehealth: Payer: Self-pay | Admitting: Family Medicine

## 2017-10-29 NOTE — Telephone Encounter (Signed)
Copied from [id number]. Topic: Quick Communication - See Telephone Encounter >> Oct 29, 2017  3:39 PM Cleaster Corin, NT wrote: CRM for notification. See Telephone encounter for:   10/29/17.pt. Daughter patricia vigil calling to see if pt. Can get something called in for a cold.   [id number] [place], [place] - [address] [place] [address] Phone: [phone] Fax: [phone]

## 2017-10-30 NOTE — Telephone Encounter (Signed)
She can take coricidin HBP, over the counter

## 2017-11-02 NOTE — Telephone Encounter (Signed)
Patient called.  Unable to reach patient. If patient calls back please inform her that she can take Coricidin HBP over the counter for her cold symptoms.

## 2017-11-03 ENCOUNTER — Ambulatory Visit: Payer: Self-pay | Admitting: Family Medicine

## 2017-11-04 ENCOUNTER — Emergency Department: Payer: Medicare Other

## 2017-11-04 ENCOUNTER — Emergency Department
Admission: EM | Admit: 2017-11-04 | Discharge: 2017-11-04 | Disposition: A | Discharge status: Home / Self Care | Payer: Medicare Other | Attending: Emergency Medicine | Admitting: Emergency Medicine

## 2017-11-04 DIAGNOSIS — N390 Urinary tract infection, site not specified: Secondary | ICD-10-CM | POA: Diagnosis not present

## 2017-11-04 DIAGNOSIS — N189 Chronic kidney disease, unspecified: Secondary | ICD-10-CM

## 2017-11-04 DIAGNOSIS — I2510 Atherosclerotic heart disease of native coronary artery without angina pectoris: Secondary | ICD-10-CM | POA: Diagnosis not present

## 2017-11-04 DIAGNOSIS — F0280 Dementia in other diseases classified elsewhere without behavioral disturbance: Secondary | ICD-10-CM | POA: Insufficient documentation

## 2017-11-04 DIAGNOSIS — I509 Heart failure, unspecified: Secondary | ICD-10-CM | POA: Insufficient documentation

## 2017-11-04 DIAGNOSIS — Z7901 Long term (current) use of anticoagulants: Secondary | ICD-10-CM | POA: Insufficient documentation

## 2017-11-04 DIAGNOSIS — W19XXXA Unspecified fall, initial encounter: Secondary | ICD-10-CM | POA: Insufficient documentation

## 2017-11-04 DIAGNOSIS — Y999 Unspecified external cause status: Secondary | ICD-10-CM | POA: Insufficient documentation

## 2017-11-04 DIAGNOSIS — S9031XA Contusion of right foot, initial encounter: Secondary | ICD-10-CM | POA: Diagnosis not present

## 2017-11-04 DIAGNOSIS — R262 Difficulty in walking, not elsewhere classified: Secondary | ICD-10-CM | POA: Diagnosis not present

## 2017-11-04 DIAGNOSIS — G309 Alzheimer's disease, unspecified: Secondary | ICD-10-CM | POA: Insufficient documentation

## 2017-11-04 DIAGNOSIS — E039 Hypothyroidism, unspecified: Secondary | ICD-10-CM | POA: Diagnosis not present

## 2017-11-04 DIAGNOSIS — Y939 Activity, unspecified: Secondary | ICD-10-CM | POA: Insufficient documentation

## 2017-11-04 DIAGNOSIS — S8001XA Contusion of right knee, initial encounter: Secondary | ICD-10-CM

## 2017-11-04 DIAGNOSIS — I132 Hypertensive heart and chronic kidney disease with heart failure and with stage 5 chronic kidney disease, or end stage renal disease: Secondary | ICD-10-CM | POA: Diagnosis not present

## 2017-11-04 DIAGNOSIS — R296 Repeated falls: Secondary | ICD-10-CM | POA: Insufficient documentation

## 2017-11-04 DIAGNOSIS — Y929 Unspecified place or not applicable: Secondary | ICD-10-CM | POA: Insufficient documentation

## 2017-11-04 DIAGNOSIS — F17220 Nicotine dependence, chewing tobacco, uncomplicated: Secondary | ICD-10-CM | POA: Insufficient documentation

## 2017-11-04 DIAGNOSIS — S0990XA Unspecified injury of head, initial encounter: Secondary | ICD-10-CM | POA: Insufficient documentation

## 2017-11-04 DIAGNOSIS — N185 Chronic kidney disease, stage 5: Secondary | ICD-10-CM | POA: Diagnosis not present

## 2017-11-04 DIAGNOSIS — Z79899 Other long term (current) drug therapy: Secondary | ICD-10-CM | POA: Diagnosis not present

## 2017-11-04 DIAGNOSIS — I129 Hypertensive chronic kidney disease with stage 1 through stage 4 chronic kidney disease, or unspecified chronic kidney disease: Secondary | ICD-10-CM | POA: Diagnosis not present

## 2017-11-04 LAB — BASIC METABOLIC PANEL
Anion gap: 11 (ref 5–15)
BUN: 44 mg/dL — AB (ref 6–20)
CHLORIDE: 108 mmol/L (ref 101–111)
CO2: 25 mmol/L (ref 22–32)
CREATININE: 2.98 mg/dL — AB (ref 0.44–1.00)
Calcium: 9 mg/dL (ref 8.9–10.3)
GFR calc Af Amer: 15 mL/min — ABNORMAL LOW (ref >60)
GFR calc non Af Amer: 13 mL/min — ABNORMAL LOW (ref >60)
GLUCOSE: 151 mg/dL — AB (ref 65–99)
POTASSIUM: 4.2 mmol/L (ref 3.5–5.1)
SODIUM: 144 mmol/L (ref 135–145)

## 2017-11-04 LAB — CBC
HCT: 36.7 % (ref 35.0–47.0)
HEMOGLOBIN: 11.8 g/dL — AB (ref 12.0–16.0)
MCH: 29.6 pg (ref 26.0–34.0)
MCHC: 32 g/dL (ref 32.0–36.0)
MCV: 92.6 fL (ref 80.0–100.0)
PLATELETS: 268 10*3/uL (ref 150–440)
RBC: 3.96 MIL/uL (ref 3.80–5.20)
RDW: 15.2 % — ABNORMAL HIGH (ref 11.5–14.5)
WBC: 9.4 10*3/uL (ref 3.6–11.0)

## 2017-11-04 LAB — URINALYSIS, COMPLETE (UACMP) WITH MICROSCOPIC
Bilirubin Urine: NEGATIVE
GLUCOSE, UA: NEGATIVE mg/dL
Hgb urine dipstick: NEGATIVE
KETONES UR: NEGATIVE mg/dL
Nitrite: NEGATIVE
PROTEIN: NEGATIVE mg/dL
Specific Gravity, Urine: 1.005 (ref 1.005–1.030)
pH: 7 (ref 5.0–8.0)

## 2017-11-04 MED ORDER — FOSFOMYCIN TROMETHAMINE 3 G PO PACK
3.0000 g | PACK | Freq: Once | ORAL | Status: AC
  Administered 2017-11-04: 3 g via ORAL
  Filled 2017-11-04: qty 3

## 2017-11-04 NOTE — ED Provider Notes (Signed)
[facility] Emergency Department Provider Note   ____________________________________________   First MD Initiated Contact with Patient 11/04/17 1902     (approximate)  I have reviewed the triage vital signs and the nursing notes.   HISTORY  Chief Complaint Fall    HPI [place] is a 84 y.o. female history of previous stroke with difficulty with speaking.  Patient also has a history of dementia, chronic kidney disease, frequent falls.  Patient presents today, family is with her and reports that while with a different family member today she did have a fall.  There are not certain exactly what happened, but she fell with a bruise on her right knee.  She also takes Coumadin, and the family wants to make sure there is no evidence of a head injury or bleeding from the fall.  The patient herself communicates via head nods and through her family members who understand her, and is able to communicate that she is not in any pain at present.  She denies headache, neck pain, trouble breathing, recent illness.  No abdominal pain.  Family does report they noticed a small bruise over her knee, they also reports she has severe arthritis in the right knee and it is not uncommon for her to have difficulty with the right knee and pain there.  Also has chronic weakness in the right hand due to a stroke.  No changes in her normal mental status, no new weakness noted.   Past Medical History:  Diagnosis Date  . Allergy   . Alzheimer's disease   . Anemia   . CCF (congestive cardiac failure) ([place])   . Chronic kidney disease   . Chronic stable angina ([place])   . Frequent falls   . Gout   . H/O: GI bleed   . Hypercholesteremia   . Hyperglycemia   . Hyperlipidemia   . Hypertension   . Incontinence   . Mixed dementia   . Myocardial infarction ([place])   . Numbness of right hand   . Osteoporosis   . Right hand weakness   . Stroke ([place])   . Thyroid disease   . Vitamin  D deficiency     Patient Active Problem List   Diagnosis Date Noted  . Malnutrition of moderate degree 02/28/2017  . Altered mental status 02/26/2017  . Dysarthria due to recent cerebrovascular accident (CVA) 06/23/2016  . Hemiparesis affecting right side as late effect of cerebrovascular accident (CVA) ([place]) 06/23/2016  . Hypotension 06/23/2016  . CKD (chronic kidney disease) stage 5, GFR less than 15 ml/min (HCC) 06/23/2016  . Anemia of chronic disease 06/23/2016  . Encephalopathy, metabolic 83/15/1761  . Intermittent confusion 06/21/2016  . Senile purpura ([place]) 05/13/2016  . Hiatal hernia 05/04/2016  . Thoracic aorta atherosclerosis ([place]) 05/04/2016  . Chronic stable angina ([place]) 10/16/2015  . Secondary hyperparathyroidism ([place]) 04/13/2015  . Arteriosclerosis of coronary artery 04/08/2015  . Benign hypertension 04/08/2015  . Chronic kidney disease (CKD), stage V ([place]) 04/08/2015  . Controlled gout 04/08/2015  . Dyslipidemia 04/08/2015  . History of peptic ulcer disease 04/08/2015  . Adult hypothyroidism 04/08/2015  . Mild cognitive disorder 04/08/2015  . Anemia of chronic disease 04/08/2015  . Osteoarthrosis involving more than one site but not generalized 04/08/2015  . Osteoporosis 04/08/2015  . Pelvic muscle wasting 04/08/2015  . Allergic rhinitis 04/08/2015  . Tobacco use 04/08/2015  . CHF (congestive heart failure) ([place]) 03/29/2014  . HLD (hyperlipidemia) 03/29/2014  . H/O gastrointestinal hemorrhage  04/05/2007    Past Surgical History:  Procedure Laterality Date  . EXPLORATORY LAPAROTOMY    . EYE SURGERY Bilateral    cataract    Prior to Admission medications   Medication Sig Start Date End Date Taking? Authorizing Provider  allopurinol (ZYLOPRIM) 100 MG tablet TAKE 1 TABLET (100 MG TOTAL) BY MOUTH DAILY. 04/06/17  Yes Sowles, Drue Stager, MD  Cholecalciferol (VITAMIN D3) 2000 UNITS capsule Take 1 capsule by mouth daily.   Yes [provider]  clopidogrel  (PLAVIX) 75 MG tablet TAKE 1 TABLET (75 MG TOTAL) BY MOUTH DAILY. 07/03/17  Yes Sowles, Drue Stager, MD  ferrous sulfate 325 (65 FE) MG tablet Take 325 mg by mouth 2 (two) times daily with a meal.   Yes [provider]  furosemide (LASIX) 40 MG tablet Take 1 tablet (40 mg total) by mouth daily. 06/15/17  Yes Sowles, Drue Stager, MD  isosorbide mononitrate (IMDUR) 60 MG 24 hr tablet TAKE 1 TABLET (60 MG TOTAL) BY MOUTH DAILY. 03/27/17  Yes Sowles, Drue Stager, MD  levothyroxine (SYNTHROID, LEVOTHROID) 75 MCG tablet Take 1 tablet (75 mcg total) by mouth daily at 6 (six) AM. 06/15/17  Yes Sowles, Drue Stager, MD  pantoprazole (PROTONIX) 40 MG tablet TAKE 1 TABLET (40 MG TOTAL) BY MOUTH DAILY. 06/19/17  Yes Sowles, Drue Stager, MD  raloxifene (EVISTA) 60 MG tablet Take 1 tablet (60 mg total) by mouth daily. 06/15/17  Yes Sowles, Drue Stager, MD  rosuvastatin (CRESTOR) 10 MG tablet Take 1 tablet (10 mg total) by mouth daily. 06/15/17  Yes Sowles, Drue Stager, MD  sodium bicarbonate 650 MG tablet TAKE 2 TABLETS (1,300 MG TOTAL) BY MOUTH TWO (2) TIMES A DAY. 03/19/15  Yes [provider]  feeding supplement, ENSURE ENLIVE, (ENSURE ENLIVE) LIQD Take 237 mLs by mouth 3 (three) times daily between meals. 02/28/17   Fritzi Mandes, MD  senna-docusate (SENOKOT-S) 8.6-50 MG tablet Take 1 tablet by mouth at bedtime as needed for mild constipation or moderate constipation. 06/23/16   Theodoro Grist, MD    Allergies Patient has no known allergies.  Family History  Problem Relation Age of Onset  . Diabetes Sister   . Hypertension Sister     Social History Social History   Tobacco Use  . Smoking status: Never Smoker  . Smokeless tobacco: Current User    Types: Snuff  Substance Use Topics  . Alcohol use: No  . Drug use: No    Review of Systems onstitutional: No fever/chills Eyes: No visual changes. ENT: No sore throat. Cardiovascular: Denies chest pain. Respiratory: Denies shortness of breath. Gastrointestinal: No  abdominal pain.  No nausea, no vomiting.   Genitourinary: Family reports she does occasionally get urinary tract infections Musculoskeletal: Negative for back pain. Skin: Negative for rash. Neurological: Negative for headaches, focal weakness or numbness other than weakness in the right hand which is old and difficulty with speech which is old and unchanged.    ____________________________________________   PHYSICAL EXAM:  VITAL SIGNS: ED Triage Vitals  Enc Vitals Group     BP 11/04/17 1852 (!) 179/82     Pulse Rate 11/04/17 1852 76     Resp 11/04/17 1852 16     Temp 11/04/17 1852 98.3 F (36.8 C)     Temp Source 11/04/17 1852 Oral     SpO2 11/04/17 1852 100 %     Weight 11/04/17 1857 106 lb (48.1 kg)     Height --      Head Circumference --  Peak Flow --      Pain Score --      Pain Loc --      Pain Edu? --      Excl. in [place]? --     Constitutional: Alert and oriented. Well appearing and in no acute distress.  Pleasant, communicates via family who understand her language and gesturing well. Eyes: Conjunctivae are normal. Head: Atraumatic. Nose: No congestion/rhinnorhea. Mouth/Throat: Mucous membranes are moist. Neck: No stridor.  No midline cervical tenderness Cardiovascular: Normal rate, regular rhythm. Grossly normal heart sounds.  Good peripheral circulation. Respiratory: Normal respiratory effort.  No retractions. Lungs CTAB. Gastrointestinal: Soft and nontender. No distention. Musculoskeletal:   RIGHT Right upper extremity demonstrates normal strength, good use of all muscles except weakness in the muscles about the hand and wrist which family reports is chronic due to prior stroke. No edema bruising or contusions of the right shoulder/upper arm, right elbow, right forearm / hand. Full range of motion of the right right upper extremity without pain. No evidence of trauma. Strong radial pulse.  LEFT Left upper extremity demonstrates normal strength, good use of  all muscles. No edema bruising or contusions of the left shoulder/upper arm, left elbow, left forearm / hand. Full range of motion of the left  upper extremity without pain. No evidence of trauma. Strong radial pulse.  Lower Extremities  No edema. Normal DP/PT pulses bilateral with good cap refill.  Normal neuro-motor function lower extremities bilateral.  RIGHT Right lower extremity demonstrates normal strength, good use of all muscles. No edema bruising or contusions of the right hip, right knee, right ankle except for a small bruise and contusion across the anterior patella without deformity.  There is obvious severe osteoarthritis involving the knee joint which appears chronic.. Full range of motion of the right lower extremity but does report some moderate discomfort across the right knee with range of motion, family does report this is a chronic issue due to severe arthritis. No pain on axial loading.   LEFT Left lower extremity demonstrates normal strength, good use of all muscles. No edema bruising or contusions of the hip,  knee, ankle. Full range of motion of the left lower extremity without pain. No pain on axial loading. No evidence of trauma.   Neurologic:  Normal speech and language. No gross focal neurologic deficits are appreciated.  Skin:  Skin is warm, dry and intact. No rash noted. Psychiatric: Mood and affect are normal. Speech and behavior are normal.  ____________________________________________   LABS (all labs ordered are listed, but only abnormal results are displayed)  Labs Reviewed  CBC - Abnormal; Notable for the following components:      Result Value   Hemoglobin 11.8 (*)    RDW 15.2 (*)    All other components within normal limits  BASIC METABOLIC PANEL - Abnormal; Notable for the following components:   Glucose, Bld 151 (*)    BUN 44 (*)    Creatinine, Ser 2.98 (*)    GFR calc non Af Amer 13 (*)    GFR calc Af Amer 15 (*)    All other components  within normal limits  URINALYSIS, COMPLETE (UACMP) WITH MICROSCOPIC - Abnormal; Notable for the following components:   Color, Urine YELLOW (*)    APPearance HAZY (*)    Leukocytes, UA SMALL (*)    Bacteria, UA FEW (*)    Squamous Epithelial / LPF 0-5 (*)    All other components within normal limits  URINE  CULTURE  PROTIME-INR   ____________________________________________  EKG  Reviewed and interpreted by me at 1900 Heart rate 90 QRS 80 QTC 490 Normal sinus rhythm, no evidence of acute ischemia denoted. Old Q waves. ____________________________________________  RADIOLOGY  Personally reviewed CT reports, final reports listed below.  No acute injuries noted.     ____________________________________________   PROCEDURES  Procedure(s) performed: None  Procedures  Critical Care performed: No  ____________________________________________   INITIAL IMPRESSION / ASSESSMENT AND PLAN / ED COURSE  Pertinent labs & imaging results that were available during my care of the patient were reviewed by me and considered in my medical decision making (see chart for details).  Patient presents for evaluation after a fall today.  Does have a history of frequent falls, currently at her baseline mental status but family reports she takes anticoagulant.  No obvious evidence of head injury, but I will obtain a CT of the head as well as cervical spine to the patient's age, dementia, and anticoagulant use reported.  She does demonstrate a small contusion across the right anterior knee, but no clear deformity or notable restriction other than some decreased range of motion which family reports is chronic due to her arthritis.  Her labs are reassuring, her renal insufficiency is chronic, CBC reassuring.  Imaging reassuring.  Patient feeling well without complaint on reevaluation.  Updated patient's family at the bedside including daughters.  Comfortable with the plan to take her home, signed  out to Dr. Kerman Passey for follow-up on urinalysis to evaluate for possible UTI prior to decision for disposition, likely discharged home with family.      ____________________________________________   FINAL CLINICAL IMPRESSION(S) / ED DIAGNOSES  Final diagnoses:  Closed head injury, initial encounter  Frequent falls  Contusion of right knee, initial encounter  Chronic kidney disease, unspecified CKD stage      NEW MEDICATIONS STARTED DURING THIS VISIT:  New Prescriptions   No medications on file     Note:  This document was prepared using Dragon voice recognition software and may include unintentional dictation errors.     Delman Kitten, MD 11/04/17 2202

## 2017-11-04 NOTE — ED Notes (Signed)
Pt discharged to home.  Family member driving.  Discharge instructions reviewed.  Verbalized understanding.  No questions or concerns at this time.  Teach back verified.  Pt in NAD.  No items left in ED.   

## 2017-11-04 NOTE — Discharge Instructions (Addendum)
You have been seen in the Emergency Department (ED) today for a fall.  Your work up does not show any concerning injuries.   ? ?Please follow up with your doctor regarding today's Emergency Department (ED) visit and your recent fall.   ? ?Return to the ED if you have any headache, confusion, slurred speech, weakness/numbness of any arm or leg, or any increased pain. ? ?

## 2017-11-04 NOTE — ED Triage Notes (Signed)
Pt from home via [facility].  Per family pt had multiple mechanical falls.  Per family pt has a slight episode of being confused that lasted briefly.  Pt is non-verbal from previous stroke.

## 2017-11-04 NOTE — ED Provider Notes (Signed)
-----------------------------------------   10:14 PM on 11/04/2017 -----------------------------------------  Patient's workup is largely at baseline.  Renal insufficiency largely unchanged from baseline.  Urinalysis did result showing mild white blood cell count elevation with few bacteria.  We will dose a one-time dose of fosfomycin in the emergency department.  I have sent a urine culture.  Plan to discharge the patient home.  I discussed this plan of care with the patient and family, they are agreeable to this plan as well.   Harvest Dark, MD 11/04/17 2214

## 2017-11-07 LAB — URINE CULTURE

## 2017-11-08 NOTE — Progress Notes (Signed)
ED Antimicrobial Stewardship Positive Culture Follow Up   Crystal Faulkner is an 84 y.o. female who presented to [facility] on 11/04/2017 with a chief complaint of fall and possible UTI.  Chief Complaint  Patient presents with  . Fall    Recent Results (from the past 720 hour(s))  Urine Culture     Status: Abnormal   Collection Time: 11/04/17  9:20 PM  Result Value Ref Range Status   Specimen Description URINE, RANDOM  Final   Special Requests NONE  Final   Culture >=100,000 COLONIES/mL ESCHERICHIA COLI (A)  Final   Report Status 11/07/2017 FINAL  Final   Organism ID, Bacteria ESCHERICHIA COLI (A)  Final      Susceptibility   Escherichia coli - MIC*    AMPICILLIN >=32 RESISTANT Resistant     CEFAZOLIN <=4 SENSITIVE Sensitive     CEFTRIAXONE <=1 SENSITIVE Sensitive     CIPROFLOXACIN <=0.25 SENSITIVE Sensitive     GENTAMICIN <=1 SENSITIVE Sensitive     IMIPENEM <=0.25 SENSITIVE Sensitive     NITROFURANTOIN <=16 SENSITIVE Sensitive     TRIMETH/SULFA <=20 SENSITIVE Sensitive     AMPICILLIN/SULBACTAM 16 INTERMEDIATE Intermediate     PIP/TAZO <=4 SENSITIVE Sensitive     Extended ESBL NEGATIVE Sensitive     * >=100,000 COLONIES/mL ESCHERICHIA COLI   Patient was treated with fosfomycin in ED. Urine culture resulted with E coli resistant to ampicillin and intermediated to ampicillin/sulbactam. After speaking with daughter, patient is afebrile but is also aphasic making it difficult to determine if patient is symptomatic. After discussion with physician, will proceed without treatment. Daughter is aware of plan and will seek medical attention if the patient becomes febrile.   ED Provider: Dr. Elenor Quinones, Niketa Turner D 11/08/2017, 1:47 PM Infectious Diseases Pharmacist Phone# [phone]

## 2017-11-19 ENCOUNTER — Telehealth: Payer: Self-pay | Admitting: Family Medicine

## 2017-11-19 DIAGNOSIS — M1990 Unspecified osteoarthritis, unspecified site: Secondary | ICD-10-CM | POA: Insufficient documentation

## 2017-11-19 DIAGNOSIS — D638 Anemia in other chronic diseases classified elsewhere: Secondary | ICD-10-CM | POA: Diagnosis not present

## 2017-11-19 DIAGNOSIS — I120 Hypertensive chronic kidney disease with stage 5 chronic kidney disease or end stage renal disease: Secondary | ICD-10-CM | POA: Diagnosis not present

## 2017-11-19 DIAGNOSIS — N185 Chronic kidney disease, stage 5: Secondary | ICD-10-CM | POA: Diagnosis not present

## 2017-11-19 DIAGNOSIS — E872 Acidosis: Secondary | ICD-10-CM | POA: Diagnosis not present

## 2017-11-19 NOTE — Telephone Encounter (Signed)
Copied from [place] [id number]. Topic: General - Other >> Nov 19, 2017  4:08 PM Margot Ables wrote: Pt daughter calling to see if FMLA paperwork is ready for pick up. Called the office and was advised it is not in the completed bin. Pts daughter states it was dropped off this morning and the girl at the front told her they would try to have it completed today because Dr. Ancil Boozer will be out tomorrow. Advised pt paperwork takes 5-7 business days. Needs corrected for 5x month at 8 hours a day - 5 days in a row. Daughter, [place], states she will call back tomorrow an maybe the nurse/CMA will have corrected it.

## 2017-11-20 NOTE — Telephone Encounter (Signed)
I contacted the patient to tell her that I do have the FMLA paperwork and have corrected the paperwork as best as I can, some of the information on the old paperwork is not legible so I will have to wait until PCP is back in the office and can complete and sign paperwork.

## 2017-11-24 NOTE — Telephone Encounter (Signed)
Checking status of paperwork, would like to pick up after 3. Call back [phone]

## 2017-11-24 NOTE — Telephone Encounter (Signed)
First I contacted pt and informed her that paperwork was ready. I then called her back and lvm informing that Dr Ancil Boozer actually is needing pt to schedule an appt that she is needing additional information as to why the paperwork need to be changed. It will not be a bad idea to bring additional documentation as from other provider as well.

## 2017-11-25 NOTE — Telephone Encounter (Signed)
Pt's daughter calling stating the FMLA paperwork was not filled out correctly. Dr. Ancil Boozer didn't sign the paperwork, she crossed out the #13. She also states her mother was in the office in 2019 and was in the hospital in 2019 as well and that these need to be corrected on the [facility] paperwork. She states she  doesn't know what Dr. Ancil Boozer is doing other than making it difficult to get her FMLA. Pt daughter disconnected while flow coordinator was trying to get a nurse. Pt has an appt on 11/27/17 @ 10:00  and Dr. Ancil Boozer will address at the time of the appt.

## 2017-11-27 ENCOUNTER — Ambulatory Visit (INDEPENDENT_AMBULATORY_CARE_PROVIDER_SITE_OTHER): Payer: Medicare Other

## 2017-11-27 ENCOUNTER — Encounter: Payer: Self-pay | Admitting: Family Medicine

## 2017-11-27 ENCOUNTER — Ambulatory Visit (INDEPENDENT_AMBULATORY_CARE_PROVIDER_SITE_OTHER): Payer: Medicare Other | Admitting: Family Medicine

## 2017-11-27 VITALS — BP 128/60 | HR 62 | Temp 97.9°F | Resp 14 | Ht 60.0 in | Wt 112.7 lb

## 2017-11-27 DIAGNOSIS — I208 Other forms of angina pectoris: Secondary | ICD-10-CM | POA: Diagnosis not present

## 2017-11-27 DIAGNOSIS — M810 Age-related osteoporosis without current pathological fracture: Secondary | ICD-10-CM

## 2017-11-27 DIAGNOSIS — N185 Chronic kidney disease, stage 5: Secondary | ICD-10-CM | POA: Diagnosis not present

## 2017-11-27 DIAGNOSIS — Z0000 Encounter for general adult medical examination without abnormal findings: Secondary | ICD-10-CM

## 2017-11-27 DIAGNOSIS — I69351 Hemiplegia and hemiparesis following cerebral infarction affecting right dominant side: Secondary | ICD-10-CM | POA: Diagnosis not present

## 2017-11-27 DIAGNOSIS — D692 Other nonthrombocytopenic purpura: Secondary | ICD-10-CM | POA: Diagnosis not present

## 2017-11-27 DIAGNOSIS — N2581 Secondary hyperparathyroidism of renal origin: Secondary | ICD-10-CM

## 2017-11-27 DIAGNOSIS — Z9181 History of falling: Secondary | ICD-10-CM | POA: Diagnosis not present

## 2017-11-27 DIAGNOSIS — E785 Hyperlipidemia, unspecified: Secondary | ICD-10-CM

## 2017-11-27 DIAGNOSIS — I700 Atherosclerosis of aorta: Secondary | ICD-10-CM | POA: Diagnosis not present

## 2017-11-27 DIAGNOSIS — I69322 Dysarthria following cerebral infarction: Secondary | ICD-10-CM | POA: Diagnosis not present

## 2017-11-27 DIAGNOSIS — I2510 Atherosclerotic heart disease of native coronary artery without angina pectoris: Secondary | ICD-10-CM | POA: Diagnosis not present

## 2017-11-27 DIAGNOSIS — E039 Hypothyroidism, unspecified: Secondary | ICD-10-CM

## 2017-11-27 LAB — TSH: TSH: 0.53 m[IU]/L (ref 0.40–4.50)

## 2017-11-27 NOTE — Progress Notes (Signed)
Name: Crystal Faulkner   MRN: [id number]    DOB: [birth year]   Date:11/27/2017       Progress Note  Subjective  Chief Complaint  Chief Complaint  Patient presents with  . Follow-up    HPI  Patient came in using her walker with two of her daughter's . One of them [place] was very upset because her FMLA form had not been filled out properly : " the doctor did not do it right".  We spent most of the visit discussing the forms. [name] was very confrontational. Explained to her that the forms had been faxed without signature per her request, I do not agree with 8 hours per day with up to 5 days off since she has a sister that can help with her care and also only seeing her less than once every 6 months. I agreed on filling out for continues time off if her mother is  Hospitalized. She was not happy. I explained to her that if se prefers she can find another pcp for her mother.I do not appreciate her calling our office multiple times demanding [place] to fill out forms the way she wants it - or HR wants it.   CVA :  She had a CVA back in October 2017, she has dysarthria , left side hemiparesis she uses a walker now.  Family has to crush her medications and mix in apple sauce. Personality seems the same. She does not speak, but nods her head and smiles.  Hypothyroidism: taking medication daily as prescribed, no constipation, she has dry skin. We will recheck TSH today   Senile purpura: bruises easily and takes aspirin, explained common to have it at her age  HTN: bp is well controlled, she has ESRD and is under the care of nephrologist   Angina: taking Imdur daily and denies chest pain, also taking statin therapy on plavix   GERD: under control, patient seems comfortable.   Dementia: stable, no wondering, able to take shower, eats by herself, but needs help coming to the doctor and family member manages her money and dispense her medication. Neurologist offered to start on medication, but  family prefers holding off on that, stable symptoms. Non verbal    CKI with anemia or chronic disease: seeing nephrologist, refuses HD, no pruritus.   Controlled Gout: no recent episodes  Atherosclerosis aorta: taking aspirin and statin therapy  Patient Active Problem List   Diagnosis Date Noted  . Osteoarthritis 11/19/2017  . Malnutrition of moderate degree 02/28/2017  . Altered mental status 02/26/2017  . Dysarthria due to recent cerebrovascular accident (CVA) 06/23/2016  . Hemiparesis affecting right side as late effect of cerebrovascular accident (CVA) ([place]) 06/23/2016  . Hypotension 06/23/2016  . CKD (chronic kidney disease) stage 5, GFR less than 15 ml/min (HCC) 06/23/2016  . Anemia of chronic disease 06/23/2016  . Encephalopathy, metabolic 49/70/2637  . Intermittent confusion 06/21/2016  . Senile purpura ([place]) 05/13/2016  . Hiatal hernia 05/04/2016  . Thoracic aorta atherosclerosis ([place]) 05/04/2016  . Chronic stable angina ([place]) 10/16/2015  . Secondary hyperparathyroidism ([place]) 04/13/2015  . Arteriosclerosis of coronary artery 04/08/2015  . Benign hypertension 04/08/2015  . Chronic kidney disease (CKD), stage V ([place]) 04/08/2015  . Controlled gout 04/08/2015  . Dyslipidemia 04/08/2015  . History of peptic ulcer disease 04/08/2015  . Adult hypothyroidism 04/08/2015  . Mild cognitive disorder 04/08/2015  . Anemia of chronic disease 04/08/2015  . Osteoarthrosis involving more than one site but not generalized 04/08/2015  .  Osteoporosis 04/08/2015  . Pelvic muscle wasting 04/08/2015  . Allergic rhinitis 04/08/2015  . Tobacco use 04/08/2015  . CHF (congestive heart failure) ([place]) 03/29/2014  . HLD (hyperlipidemia) 03/29/2014  . H/O gastrointestinal hemorrhage 04/05/2007    Past Surgical History:  Procedure Laterality Date  . EXPLORATORY LAPAROTOMY    . EYE SURGERY Bilateral    cataract    Family History  Problem Relation Age of Onset  . Diabetes Sister   .  Hypertension Sister   . Hypertension Brother   . Heart disease Brother     Social History   Socioeconomic History  . Marital status: Widowed    Spouse name: Not on file  . Number of children: 6  . Years of education: Not on file  . Highest education level: 8th grade  Occupational History  . Occupation: Retired  Scientific laboratory technician  . Financial resource strain: Not hard at all  . Food insecurity:    Worry: Never true    Inability: Never true  . Transportation needs:    Medical: No    Non-medical: No  Tobacco Use  . Smoking status: Never Smoker  . Smokeless tobacco: Current User    Types: Snuff  . Tobacco comment: smoking cessation materials not requuired  Substance and Sexual Activity  . Alcohol use: No  . Drug use: No  . Sexual activity: Never  Lifestyle  . Physical activity:    Days per week: 0 days    Minutes per session: 0 min  . Stress: Not at all  Relationships  . Social connections:    Talks on phone: Patient refused    Gets together: Patient refused    Attends religious service: Patient refused    Active member of club or organization: Patient refused    Attends meetings of clubs or organizations: Patient refused    Relationship status: Patient refused  . Intimate partner violence:    Fear of current or ex partner: No    Emotionally abused: No    Physically abused: No    Forced sexual activity: No  Other Topics Concern  . Not on file  Social History Narrative   She lives with her daughter.    She also has her nephew and niece at home.    Had a stroke October 2017 and has difficulty walking and also unable to talk since.          Current Outpatient Medications:  .  allopurinol (ZYLOPRIM) 100 MG tablet, TAKE 1 TABLET (100 MG TOTAL) BY MOUTH DAILY., Disp: 90 tablet, Rfl: 2 .  Cholecalciferol (VITAMIN D3) 2000 UNITS capsule, Take 1 capsule by mouth daily., Disp: , Rfl:  .  clopidogrel (PLAVIX) 75 MG tablet, TAKE 1 TABLET (75 MG TOTAL) BY MOUTH DAILY., Disp:  90 tablet, Rfl: 1 .  ferrous sulfate 325 (65 FE) MG tablet, Take 325 mg by mouth 2 (two) times daily with a meal., Disp: , Rfl:  .  furosemide (LASIX) 40 MG tablet, Take 1 tablet (40 mg total) by mouth daily., Disp: 90 tablet, Rfl: 1 .  isosorbide mononitrate (IMDUR) 60 MG 24 hr tablet, TAKE 1 TABLET (60 MG TOTAL) BY MOUTH DAILY., Disp: 90 tablet, Rfl: 2 .  levothyroxine (SYNTHROID, LEVOTHROID) 75 MCG tablet, Take 1 tablet (75 mcg total) by mouth daily at 6 (six) AM., Disp: 90 tablet, Rfl: 1 .  pantoprazole (PROTONIX) 40 MG tablet, TAKE 1 TABLET (40 MG TOTAL) BY MOUTH DAILY., Disp: 90 tablet, Rfl: 1 .  raloxifene (  EVISTA) 60 MG tablet, Take 1 tablet (60 mg total) by mouth daily., Disp: 90 tablet, Rfl: 2 .  rosuvastatin (CRESTOR) 10 MG tablet, Take 1 tablet (10 mg total) by mouth daily., Disp: 90 tablet, Rfl: 1 .  sodium bicarbonate 650 MG tablet, TAKE 2 TABLETS (1,300 MG TOTAL) BY MOUTH TWO (2) TIMES A DAY., Disp: , Rfl: 11  No Known Allergies   ROS  Ten systems reviewed and is negative except as mentioned in HPI, unable to get it from patient, daughter's that responded for her, non verbal, but seems comfortable  Objective  Vitals:   11/27/17 1101  BP: 128/60  Pulse: 62  Resp: 14  Temp: 97.9 F (36.6 C)  TempSrc: Oral  SpO2: 97%  Weight: 112 lb 11.2 oz (51.1 kg)  Height: 5' (1.524 m)    Body mass index is 22.01 kg/m.  Physical Exam  Constitutional: Patient appears well-developed No distress.  HEENT: head atraumatic, normocephalic, pupils equal and reactive to light, neck supple, throat within normal limits Cardiovascular: Normal rate, regular rhythm and normal heart sounds.  No murmur heard. No BLE edema. Pulmonary/Chest: Effort normal and breath sounds normal. No respiratory distress. Abdominal: Soft.  There is no tenderness. Psychiatric: non verbal, seems happy, well groomed Neurological: right side hemiparesis, using walker  Recent Results (from the past 2160 hour(s))   CBC     Status: Abnormal   Collection Time: 11/04/17  7:27 PM  Result Value Ref Range   WBC 9.4 3.6 - 11.0 K/uL   RBC 3.96 3.80 - 5.20 MIL/uL   Hemoglobin 11.8 (L) 12.0 - 16.0 g/dL   HCT 36.7 35.0 - 47.0 %   MCV 92.6 80.0 - 100.0 fL   MCH 29.6 26.0 - 34.0 pg   MCHC 32.0 32.0 - 36.0 g/dL   RDW 15.2 (H) 11.5 - 14.5 %   Platelets 268 150 - 440 K/uL    Comment: Performed at [facility], [address] [place], [place] [address]  Basic metabolic panel     Status: Abnormal   Collection Time: 11/04/17  7:27 PM  Result Value Ref Range   Sodium 144 135 - 145 mmol/L   Potassium 4.2 3.5 - 5.1 mmol/L   Chloride 108 101 - 111 mmol/L   CO2 25 22 - 32 mmol/L   Glucose, Bld 151 (H) 65 - 99 mg/dL   BUN 44 (H) 6 - 20 mg/dL   Creatinine, Ser 2.98 (H) 0.44 - 1.00 mg/dL   Calcium 9.0 8.9 - 10.3 mg/dL   GFR calc non Af Amer 13 (L) >60 mL/min   GFR calc Af Amer 15 (L) >60 mL/min    Comment: (NOTE) The eGFR has been calculated using the CKD EPI equation. This calculation has not been validated in all clinical situations. eGFR's persistently <60 mL/min signify possible Chronic Kidney Disease.    Anion gap 11 5 - 15    Comment: Performed at [facility], [address] [place], [place] [address]  Urinalysis, Complete w Microscopic     Status: Abnormal   Collection Time: 11/04/17  7:27 PM  Result Value Ref Range   Color, Urine YELLOW (A) YELLOW   APPearance HAZY (A) CLEAR   Specific Gravity, Urine 1.005 1.005 - 1.030   pH 7.0 5.0 - 8.0   Glucose, UA NEGATIVE NEGATIVE mg/dL   Hgb urine dipstick NEGATIVE NEGATIVE   Bilirubin Urine NEGATIVE NEGATIVE   Ketones, ur NEGATIVE NEGATIVE mg/dL   Protein, ur NEGATIVE NEGATIVE  mg/dL   Nitrite NEGATIVE NEGATIVE   Leukocytes, UA SMALL (A) NEGATIVE   RBC / HPF 0-5 0 - 5 RBC/hpf   WBC, UA 6-30 0 - 5 WBC/hpf   Bacteria, UA FEW (A) NONE SEEN   Squamous Epithelial / LPF 0-5 (A) NONE SEEN   Mucus PRESENT     Comment: Performed at  [facility], [address] [place], [place] [address]  Urine Culture     Status: Abnormal   Collection Time: 11/04/17  9:20 PM  Result Value Ref Range   Specimen Description URINE, RANDOM    Special Requests NONE    Culture >=100,000 COLONIES/mL ESCHERICHIA COLI (A)    Report Status 11/07/2017 FINAL    Organism ID, Bacteria ESCHERICHIA COLI (A)       Susceptibility   Escherichia coli - MIC*    AMPICILLIN >=32 RESISTANT Resistant     CEFAZOLIN <=4 SENSITIVE Sensitive     CEFTRIAXONE <=1 SENSITIVE Sensitive     CIPROFLOXACIN <=0.25 SENSITIVE Sensitive     GENTAMICIN <=1 SENSITIVE Sensitive     IMIPENEM <=0.25 SENSITIVE Sensitive     NITROFURANTOIN <=16 SENSITIVE Sensitive     TRIMETH/SULFA <=20 SENSITIVE Sensitive     AMPICILLIN/SULBACTAM 16 INTERMEDIATE Intermediate     PIP/TAZO <=4 SENSITIVE Sensitive     Extended ESBL NEGATIVE Sensitive     * >=100,000 COLONIES/mL ESCHERICHIA COLI      PHQ2/9: Depression screen [facility] 2/9 11/27/2017 11/27/2017 06/15/2017 03/13/2017 06/26/2016  Decreased Interest 0 0 0 0 0  Down, Depressed, Hopeless 0 0 0 0 0  PHQ - 2 Score 0 0 0 0 0  Altered sleeping 0 0 - - -  Tired, decreased energy 0 0 - - -  Change in appetite 0 0 - - -  Feeling bad or failure about yourself  0 0 - - -  Trouble concentrating 0 0 - - -  Moving slowly or fidgety/restless 0 0 - - -  Suicidal thoughts 0 0 - - -  PHQ-9 Score 0 0 - - -  Difficult doing work/chores Not difficult at all Not difficult at all - - -    Fall Risk: Fall Risk  11/27/2017 06/15/2017 03/13/2017 06/26/2016 06/13/2016  Falls in the past year? _0   Comment shuffling and unsteady gait - - - -  Number falls in past yr: 2 or more 2 or more 2 or more 2 or more 2 or more  Injury with Fall? Yes Yes No No Yes  Comment skin tears - - - -  Risk Factor Category  High Fall Risk - - - High Fall Risk  Risk for fall due to : History of fall(s);Impaired balance/gait;Impaired vision;Impaired  mobility - Impaired balance/gait - -  Risk for fall due to: Comment h/o fractures; occular implants; use of walker; shuffling, unsteady gait - - - -  Follow up Falls evaluation completed;Education provided;Falls prevention discussed - - - Falls evaluation completed      Assessment & Plan  1. Hemiparesis affecting right side as late effect of cerebrovascular accident (CVA) ([place])  stable  2. Chronic stable angina (HCC)  Stable  3. Other specified hypothyroidism  -TSH  4. Arteriosclerosis of coronary artery   5. Dysarthria as late effect of cerebrovascular accident (CVA)  She is non verbal now  6. Thoracic aorta atherosclerosis ([place])  stable  7. Secondary hyperparathyroidism ([place])  From CKD  8. Senile purpura (HCC)  stable  9. Chronic kidney  disease (CKD), stage V ([place])  Not interested in HD  10. Dyslipidemia  On statin therapy  11. Osteoporosis without current pathological fracture, unspecified osteoporosis type  Going to resume PT to avoid fractures

## 2017-11-27 NOTE — Progress Notes (Addendum)
Subjective:   Crystal Faulkner is a 84 y.o. female who presents for Medicare Annual (Subsequent) preventive examination.  Review of Systems:  N/A Cardiac Risk Factors include: advanced age (>62men, >49 women);dyslipidemia;hypertension;sedentary lifestyle     Objective:     Vitals: BP 128/60 (BP Location: Left Arm, Patient Position: Sitting, Cuff Size: Normal)   Pulse 62   Temp 97.9 F (36.6 C) (Oral)   Resp 14   Ht 5' (1.524 m)   Wt 112 lb 11.2 oz (51.1 kg)   SpO2 97%   BMI 22.01 kg/m   Body mass index is 22.01 kg/m.  Advanced Directives 11/27/2017 11/04/2017 02/26/2017 02/26/2017 01/27/2017 01/23/2017 11/10/2016  Does Patient Have a Medical Advance Directive? Yes Yes No;Yes No;Yes No No;Yes Yes  Type of Paramedic of [place];Living will [place];Living will Healthcare Power of [place] of [place] in Chart? Yes No - copy requested Yes No - copy requested - - -  Would patient like information on creating a medical advance directive? - - No - Patient declined - - No - Patient declined -    Tobacco Social History   Tobacco Use  Smoking Status Never Smoker  Smokeless Tobacco Current User  . Types: Snuff  Tobacco Comment   smoking cessation materials not requuired     Ready to quit: No Counseling given: No Comment: smoking cessation materials not requuired   Clinical Intake:  Pre-visit preparation completed: Yes  Pain : 0-10 Pain Type: Chronic pain Pain Location: Knee Pain Orientation: Right Pain Radiating Towards: chronic pain   Nutritional Status: BMI of 19-24  Normal Nutritional Risks: None Diabetes: No  How often do you need to have someone help you when you read instructions, pamphlets, or other written materials from your doctor or pharmacy?: 5 - Always  Interpreter Needed?:  No  Information entered by :: AE[place], LPN   Hospitalizations/ED visits and surgeries occurring within the previous 12 months:  Within the previous 12 months, pt has not underwent any surgical procedures. However, pt was seen at [facility] ED on 01/23/17 for altered mental status, treated by Dr. Jimmye Norman. Pt was seen in follow up by Raelyn Ensign, FNP on 01/27/17. Planning included:  Assessment & Plan  1. Acute cystitis without hematuria - POCT urinalysis dipstick - Results is negative for cystitis, advised this may be due to antibiotic use, but we will send for repeat culture to be sure. - Urine Culture - Advised that patient may take granules from capsules in applesauce to take medication.   2. Mild cognitive disorder - Schedule appointment with Dr. Manuella Ghazi, Neurologist, when possible to assess progression of cognitive dysfunction.  3. Hemiparesis affecting right side as late effect of cerebrovascular accident (CVA) ([place]) - Discussed option of re-ordering home PT to work on patient's gait, but family declines and states they want to work with the gait belt first.   -Red flags and when to present for emergency care or RTC including fever >101.27F, chest pain, shortness of breath, new/worsening/un-resolving symptoms, significant change in mental status reviewed with patient at time of visit. Follow up and care instructions discussed and provided in AVS.  I have reviewed this encounter including the documentation in this note and/or discussed this patient with the Johney Maine, FNP, NP-C. I am certifying that I agree with the content of this note as supervising physician.  Pt was again seen at [facility] ED on 02/26/17 for syncope and collapse, treated by Dr. Posey Pronto. Pt was seen in follow up by Dr. Ancil Boozer on 03/13/17. Planning included:   Assessment & Plan  1. Chronic kidney disease (CKD), stage V (HCC)  - COMPLETE METABOLIC PANEL WITH GFR - asked by Dr. Johnney Ou and we will send results to  her  2. History of syncope  Hospitalized secondary to acute syncope on 02/26/2017, she had blood transfusion because of worsening of anemia, and was discharged home, she refuses HD  3. Senile purpura (HCC)  Very think skin   In addition, pt was seen at [facility] ED on 11/04/17 for closed head injury, fall, treated by Dr. Jacqualine Code. No f/u took place by PCP.  Past Medical History:  Diagnosis Date  . Allergy   . Alzheimer's disease   . Anemia   . CCF (congestive cardiac failure) ([place])   . Chronic kidney disease   . Chronic stable angina ([place])   . Frequent falls   . Gout   . H/O: GI bleed   . Hypercholesteremia   . Hyperglycemia   . Hyperlipidemia   . Hypertension   . Incontinence   . Mixed dementia   . Myocardial infarction ([place])   . Numbness of right hand   . Osteoporosis   . Right hand weakness   . Stroke ([place])   . Thyroid disease   . Vitamin D deficiency    Past Surgical History:  Procedure Laterality Date  . EXPLORATORY LAPAROTOMY    . EYE SURGERY Bilateral    cataract   Family History  Problem Relation Age of Onset  . Diabetes Sister   . Hypertension Sister   . Hypertension Brother   . Heart disease Brother    Social History   Socioeconomic History  . Marital status: Widowed    Spouse name: Not on file  . Number of children: 6  . Years of education: Not on file  . Highest education level: 8th grade  Occupational History  . Occupation: Retired  Scientific laboratory technician  . Financial resource strain: Not hard at all  . Food insecurity:    Worry: Never true    Inability: Never true  . Transportation needs:    Medical: No    Non-medical: No  Tobacco Use  . Smoking status: Never Smoker  . Smokeless tobacco: Current User    Types: Snuff  . Tobacco comment: smoking cessation materials not requuired  Substance and Sexual Activity  . Alcohol use: No  . Drug use: No  . Sexual activity: Never  Lifestyle  . Physical activity:    Days per week: 0 days    Minutes per  session: 0 min  . Stress: Not at all  Relationships  . Social connections:    Talks on phone: Patient refused    Gets together: Patient refused    Attends religious service: Patient refused    Active member of club or organization: Patient refused    Attends meetings of clubs or organizations: Patient refused    Relationship status: Patient refused  Other Topics Concern  . Not on file  Social History Narrative   She lives with her daughter.    She also has her nephew and niece at home.    Had a stroke October 2017 and has difficulty walking and also unable to talk since.         Outpatient Encounter Medications as of 11/27/2017  Medication Sig  .  allopurinol (ZYLOPRIM) 100 MG tablet TAKE 1 TABLET (100 MG TOTAL) BY MOUTH DAILY.  Marland Kitchen Cholecalciferol (VITAMIN D3) 2000 UNITS capsule Take 1 capsule by mouth daily.  . clopidogrel (PLAVIX) 75 MG tablet TAKE 1 TABLET (75 MG TOTAL) BY MOUTH DAILY.  . ferrous sulfate 325 (65 FE) MG tablet Take 325 mg by mouth 2 (two) times daily with a meal.  . furosemide (LASIX) 40 MG tablet Take 1 tablet (40 mg total) by mouth daily.  . isosorbide mononitrate (IMDUR) 60 MG 24 hr tablet TAKE 1 TABLET (60 MG TOTAL) BY MOUTH DAILY.  Marland Kitchen levothyroxine (SYNTHROID, LEVOTHROID) 75 MCG tablet Take 1 tablet (75 mcg total) by mouth daily at 6 (six) AM.  . pantoprazole (PROTONIX) 40 MG tablet TAKE 1 TABLET (40 MG TOTAL) BY MOUTH DAILY.  . raloxifene (EVISTA) 60 MG tablet Take 1 tablet (60 mg total) by mouth daily.  . rosuvastatin (CRESTOR) 10 MG tablet Take 1 tablet (10 mg total) by mouth daily.  . sodium bicarbonate 650 MG tablet TAKE 2 TABLETS (1,300 MG TOTAL) BY MOUTH TWO (2) TIMES A DAY.  . [DISCONTINUED] feeding supplement, ENSURE ENLIVE, (ENSURE ENLIVE) LIQD Take 237 mLs by mouth 3 (three) times daily between meals.  . [DISCONTINUED] senna-docusate (SENOKOT-S) 8.6-50 MG tablet Take 1 tablet by mouth at bedtime as needed for mild constipation or moderate constipation.     No facility-administered encounter medications on file as of 11/27/2017.     Activities of Daily Living In your present state of health, do you have any difficulty performing the following activities: 11/27/2017 06/15/2017  Hearing? N Y  Comment denies hearing aids -  Vision? Y Y  Comment occular implants -  Difficulty concentrating or making decisions? N Y  Walking or climbing stairs? Y Y  Comment needs walker, unsteady gait; R knee pain -  Dressing or bathing? Y Y  Comment clothes herself. Dtr bathes -  Doing errands, shopping? Y Y  Comment dtr transports -  Conservation officer, nature and eating ? N -  Comment full set upper an lower dentures -  Using the Toilet? Y -  Comment dtr takes to bathroom every 2 hours -  In the past six months, have you accidently leaked urine? N -  Do you have problems with loss of bowel control? N -  Managing your Medications? Y -  Comment dtr manages medications -  Managing your Finances? Y -  Comment dtr manages finances -  Housekeeping or managing your Housekeeping? Y -  Comment folds clothes but dtr manages home -  Some recent data might be hidden    Patient Care Team: Steele Sizer, MD as PCP - General (Family Medicine) Steele Sizer, MD (Family Medicine) Isaias Cowman, MD as Consulting Physician (Cardiology) Vladimir Crofts, MD as Consulting Physician (Neurology) Justin Mend, MD as Attending Physician (Nephrology)    Assessment:   This is a routine wellness examination for [name].  Exercise Activities and Dietary recommendations Current Exercise Habits: The patient does not participate in regular exercise at present, Exercise limited by: neurologic condition(s);orthopedic condition(s)(stroke, knee pain)  Goals    . Prevent falls     Recommend to remove any items from the home that may cause slips or trips.       Fall Risk Fall Risk  11/27/2017 06/15/2017 03/13/2017 06/26/2016 06/13/2016  Falls in the past year? Yes Yes Yes  Yes Yes  Comment shuffling and unsteady gait - - - -  Number falls in past yr: 2  or more 2 or more 2 or more 2 or more 2 or more  Injury with Fall? Yes Yes No No Yes  Comment skin tears - - - -  Risk Factor Category  High Fall Risk - - - High Fall Risk  Risk for fall due to : History of fall(s);Impaired balance/gait;Impaired vision;Impaired mobility - Impaired balance/gait - -  Risk for fall due to: Comment h/o fractures; occular implants; use of walker; shuffling, unsteady gait - - - -  Follow up Falls evaluation completed;Education provided;Falls prevention discussed - - - Falls evaluation completed   NOTE - PT LIVES WITH HER DAUGHTER Is the home free of loose throw rugs in walkways, pet beds, electrical cords, etc? Yes Adequate lighting to reduce risk of falls?  Yes In addition, does the patient have any of the following: Stairs in or around the home WITH handrails? Yes Grab bars in the bathroom? No  Shower chair or a place to sit while bathing? Yes Use of an elevated toilet seat or a handicapped toilet? No Use of a cane, walker or w/c? Use of walker and does have a w/c if needed  Timed Get Up and Go Performed: Yes. Pt ambulated 10 feet within 42 sec. Gait very slow, highly unsteady and with 2 person assist and with the use of a quad walker with tennis balls attached to the front 2 legs. Intervention is required at this time. Fall risk prevention has been discussed. Community Resource Referral sent to Care Guide for PT for strengthening and ambulation, consideration for replacement of walker with a more appropriate assistive device.  Dtr declined my offer to send [employer] Referral to Care Guide for installation of grab bars in the shower or an elevated toilet seat.  Depression Screen PHQ 2/9 Scores 11/27/2017 11/27/2017 06/15/2017 03/13/2017  PHQ - 2 Score 0 0 0 0  PHQ- 9 Score 0 0 - -     Cognitive Function: NON-VERBAL        Immunization History  Administered Date(s)  Administered  . Influenza, High Dose Seasonal PF 05/13/2016, 06/15/2017  . Influenza, Seasonal, Injecte, Preservative Fre 05/01/2010, 05/30/2011, 05/17/2012  . Influenza,inj,Quad PF,6+ Mos 05/26/2013, 06/12/2014, 05/11/2015  . Influenza-Unspecified 06/12/2014  . Pneumococcal Conjugate-13 05/11/2015  . Pneumococcal Polysaccharide-23 06/22/2006  . Tdap 10/16/2015  . Zoster 04/13/2015    Qualifies for Shingles Vaccine? Yes. Zostavax completed 04/13/15. Due for Shingrix vaccine. Education has been provided regarding the importance of this vaccine. Pt has been advised to call her insurance company to determine her out of pocket expense. Advised she may also receive this vaccine at her local pharmacy or Health Dept. Verbalized acceptance and understanding.  Due for Tdap vaccine. Education has been provided regarding the importance of this vaccine. Pt has been advised she may receive this vaccine at her local pharmacy or Health Dept. Also advised to provide a copy of her vaccination record if she chooses to receive this vaccine at her local pharmacy. Verbalized acceptance and understanding.  Screening Tests Health Maintenance  Topic Date Due  . INFLUENZA VACCINE  03/25/2018  . TETANUS/TDAP  10/15/2025  . DEXA SCAN  Completed  . PNA vac Low Risk Adult  Completed    Cancer Screenings: Lung: Low Dose CT Chest recommended if Age 58-80 years, 30 pack-year currently smoking OR have quit w/in 15years. Patient does not qualify. Breast screening: No longer required due to age   Bone Density/Dexa: No longer required due to age Colorectal: No longer required due to  age  Additional Screenings: Hepatitis C Screening: Does not qualify     Plan:  I have personally reviewed and addressed the Medicare Annual Wellness questionnaire and have noted the following in the patient's chart:  A. Medical and social history B. Use of alcohol, tobacco or illicit drugs  C. Current medications and  supplements D. Functional ability and status E.  Nutritional status F.  Physical activity G. Advance directives H. List of other physicians I.  Hospitalizations, surgeries, and ER visits in previous 12 months J.  [place] such as hearing and vision if needed, cognitive and depression L. Referrals and appointments  In addition, I have reviewed and discussed with patient certain preventive protocols, quality metrics, and best practice recommendations. A written personalized care plan for preventive services as well as general preventive health recommendations were provided to patient.  See attached scanned questionnaire for additional information.   Signed,  Aleatha Borer, LPN Nurse Health Advisor  I have reviewed this encounter including the documentation in this note and/or discussed this patient with the provider, Aleatha Borer, LPN. I am certifying that I agree with the content of this note as supervising physician.  Steele Sizer, MD [facility] 11/27/2017, 3:18 PM

## 2017-11-27 NOTE — Patient Instructions (Signed)
Crystal Faulkner , Thank you for taking time to come for your Medicare Wellness Visit. I appreciate your ongoing commitment to your health goals. Please review the following plan we discussed and let me know if I can assist you in the future.   Screening recommendations/referrals: Colorectal Screening: No longer required Mammogram: No longer required Bone Density: No longer required Lung Cancer Screening: You do not qualify for this screening  Vision and Dental Exams: Recommended annual ophthalmology exams for early detection of glaucoma and other disorders of the eye Recommended annual dental exams for proper oral hygiene  Vaccinations: Influenza vaccine: Up to date Pneumococcal vaccine: Completed series Tdap vaccine: Up to date Shingles vaccine: Please call your insurance company to determine your out of pocket expense for the Shingrix vaccine. You may also receive this vaccine at your local pharmacy or Health Dept.  Advanced directives: We have received a copy of your POA (Power of [place]) and/or Living Will. These documents can be located in your chart.  Conditions/risks identified: Recommend to remove any items from the home that may cause slips or trips.  Next appointment: Please schedule your Annual Wellness Visit with your Nurse Health Advisor in one year.  Preventive Care 13 Years and Older, Female Preventive care refers to lifestyle choices and visits with your health care provider that can promote health and wellness. What does preventive care include?  A yearly physical exam. This is also called an annual well check.  Dental exams once or twice a year.  Routine eye exams. Ask your health care provider how often you should have your eyes checked.  Personal lifestyle choices, including:  Daily care of your teeth and gums.  Regular physical activity.  Eating a healthy diet.  Avoiding tobacco and drug use.  Limiting alcohol use.  Practicing safe sex.  Taking  low-dose aspirin every day.  Taking vitamin and mineral supplements as recommended by your health care provider. What happens during an annual well check? The services and screenings done by your health care provider during your annual well check will depend on your age, overall health, lifestyle risk factors, and family history of disease. Counseling  Your health care provider may ask you questions about your:  Alcohol use.  Tobacco use.  Drug use.  Emotional well-being.  Home and relationship well-being.  Sexual activity.  Eating habits.  History of falls.  Memory and ability to understand (cognition).  Work and work Statistician.  Reproductive health. Screening  You may have the following tests or measurements:  Height, weight, and BMI.  Blood pressure.  Lipid and cholesterol levels. These may be checked every 5 years, or more frequently if you are over 65 years old.  Skin check.  Lung cancer screening. You may have this screening every year starting at age 52 if you have a 30-pack-year history of smoking and currently smoke or have quit within the past 15 years.  Fecal occult blood test (FOBT) of the stool. You may have this test every year starting at age 36.  Flexible sigmoidoscopy or colonoscopy. You may have a sigmoidoscopy every 5 years or a colonoscopy every 10 years starting at age 109.  Hepatitis C blood test.  Hepatitis B blood test.  Sexually transmitted disease (STD) testing.  Diabetes screening. This is done by checking your blood sugar (glucose) after you have not eaten for a while (fasting). You may have this done every 1-3 years.  Bone density scan. This is done to screen for osteoporosis. You may  have this done starting at age 10.  Mammogram. This may be done every 1-2 years. Talk to your health care provider about how often you should have regular mammograms. Talk with your health care provider about your test results, treatment options, and  if necessary, the need for more tests. Vaccines  Your health care provider may recommend certain vaccines, such as:  Influenza vaccine. This is recommended every year.  Tetanus, diphtheria, and acellular pertussis (Tdap, Td) vaccine. You may need a Td booster every 10 years.  Zoster vaccine. You may need this after age 30.  Pneumococcal 13-valent conjugate (PCV13) vaccine. One dose is recommended after age 77.  Pneumococcal polysaccharide (PPSV23) vaccine. One dose is recommended after age 32. Talk to your health care provider about which screenings and vaccines you need and how often you need them. This information is not intended to replace advice given to you by your health care provider. Make sure you discuss any questions you have with your health care provider. Document Released: 09/07/2015 Document Revised: 04/30/2016 Document Reviewed: 06/12/2015 Elsevier Interactive Patient Education  2017 [place] Prevention in the Home Falls can cause injuries. They can happen to people of all ages. There are many things you can do to make your home safe and to help prevent falls. What can I do on the outside of my home?  Regularly fix the edges of walkways and driveways and fix any cracks.  Remove anything that might make you trip as you walk through a door, such as a raised step or threshold.  Trim any bushes or trees on the path to your home.  Use bright outdoor lighting.  Clear any walking paths of anything that might make someone trip, such as rocks or tools.  Regularly check to see if handrails are loose or broken. Make sure that both sides of any steps have handrails.  Any raised decks and porches should have guardrails on the edges.  Have any leaves, snow, or ice cleared regularly.  Use sand or salt on walking paths during winter.  Clean up any spills in your garage right away. This includes oil or grease spills. What can I do in the bathroom?  Use night  lights.  Install grab bars by the toilet and in the tub and shower. Do not use towel bars as grab bars.  Use non-skid mats or decals in the tub or shower.  If you need to sit down in the shower, use a plastic, non-slip stool.  Keep the floor dry. Clean up any water that spills on the floor as soon as it happens.  Remove soap buildup in the tub or shower regularly.  Attach bath mats securely with double-sided non-slip rug tape.  Do not have throw rugs and other things on the floor that can make you trip. What can I do in the bedroom?  Use night lights.  Make sure that you have a light by your bed that is easy to reach.  Do not use any sheets or blankets that are too big for your bed. They should not hang down onto the floor.  Have a firm chair that has side arms. You can use this for support while you get dressed.  Do not have throw rugs and other things on the floor that can make you trip. What can I do in the kitchen?  Clean up any spills right away.  Avoid walking on wet floors.  Keep items that you use a lot in  easy-to-reach places.  If you need to reach something above you, use a strong step stool that has a grab bar.  Keep electrical cords out of the way.  Do not use floor polish or wax that makes floors slippery. If you must use wax, use non-skid floor wax.  Do not have throw rugs and other things on the floor that can make you trip. What can I do with my stairs?  Do not leave any items on the stairs.  Make sure that there are handrails on both sides of the stairs and use them. Fix handrails that are broken or loose. Make sure that handrails are as long as the stairways.  Check any carpeting to make sure that it is firmly attached to the stairs. Fix any carpet that is loose or worn.  Avoid having throw rugs at the top or bottom of the stairs. If you do have throw rugs, attach them to the floor with carpet tape.  Make sure that you have a light switch at the  top of the stairs and the bottom of the stairs. If you do not have them, ask someone to add them for you. What else can I do to help prevent falls?  Wear shoes that:  Do not have high heels.  Have rubber bottoms.  Are comfortable and fit you well.  Are closed at the toe. Do not wear sandals.  If you use a stepladder:  Make sure that it is fully opened. Do not climb a closed stepladder.  Make sure that both sides of the stepladder are locked into place.  Ask someone to hold it for you, if possible.  Clearly mark and make sure that you can see:  Any grab bars or handrails.  First and last steps.  Where the edge of each step is.  Use tools that help you move around (mobility aids) if they are needed. These include:  Canes.  Walkers.  Scooters.  Crutches.  Turn on the lights when you go into a dark area. Replace any light bulbs as soon as they burn out.  Set up your furniture so you have a clear path. Avoid moving your furniture around.  If any of your floors are uneven, fix them.  If there are any pets around you, be aware of where they are.  Review your medicines with your doctor. Some medicines can make you feel dizzy. This can increase your chance of falling. Ask your doctor what other things that you can do to help prevent falls. This information is not intended to replace advice given to you by your health care provider. Make sure you discuss any questions you have with your health care provider. Document Released: 06/07/2009 Document Revised: 01/17/2016 Document Reviewed: 09/15/2014 Elsevier Interactive Patient Education  2017 [employer].

## 2017-11-30 ENCOUNTER — Telehealth: Payer: Self-pay

## 2017-11-30 NOTE — Telephone Encounter (Signed)
Patient called.  Unable to reach patient. If patient calls back please inform her of her most recent lab results.

## 2017-12-01 ENCOUNTER — Telehealth: Payer: Self-pay | Admitting: Family Medicine

## 2017-12-01 NOTE — Telephone Encounter (Signed)
Daughter given lab results per notes of Dr. Ancil Boozer on 11/27/17.  Daughter verbalized understanding.  (unable to chart on result note since the result was not forwarded to the [facility].)

## 2017-12-16 ENCOUNTER — Other Ambulatory Visit: Payer: Self-pay | Admitting: Family Medicine

## 2017-12-16 DIAGNOSIS — I208 Other forms of angina pectoris: Secondary | ICD-10-CM

## 2017-12-16 NOTE — Telephone Encounter (Signed)
Refill request for general medication. Pantoprazole and Isosorbide to [facility].  Last office visit: 11/27/2017   Follow up on 03/29/2018

## 2018-01-07 ENCOUNTER — Other Ambulatory Visit: Payer: Self-pay | Admitting: Family Medicine

## 2018-01-07 DIAGNOSIS — I208 Other forms of angina pectoris: Secondary | ICD-10-CM

## 2018-01-07 DIAGNOSIS — I69351 Hemiplegia and hemiparesis following cerebral infarction affecting right dominant side: Secondary | ICD-10-CM

## 2018-01-07 DIAGNOSIS — I2510 Atherosclerotic heart disease of native coronary artery without angina pectoris: Secondary | ICD-10-CM

## 2018-01-07 DIAGNOSIS — I69322 Dysarthria following cerebral infarction: Secondary | ICD-10-CM

## 2018-01-14 ENCOUNTER — Telehealth: Payer: Self-pay

## 2018-01-14 NOTE — Telephone Encounter (Signed)
Received CRM re: pt request for status update on C3 referral. Will forward this message to Amy Baker for further follow up.

## 2018-01-14 NOTE — Telephone Encounter (Signed)
Amy,   Do you know the status of this?

## 2018-01-14 NOTE — Telephone Encounter (Signed)
Copied from [place] [phone]. Topic: Referral - Status >> Jan 13, 2018  4:08 PM Vernona Rieger wrote: Reason for CRM: Patient's daughter [name] called and wanted to know if Dr Ancil Boozer was still going to send out physical therapy for her at her home. Call back is [phone].  A referral was placed for [facility] Care Team. I am not sure what the status is. Sent to referrals to follow up on.

## 2018-01-20 ENCOUNTER — Telehealth: Payer: Self-pay | Admitting: Family Medicine

## 2018-01-20 ENCOUNTER — Encounter: Payer: Self-pay | Admitting: Family Medicine

## 2018-01-20 NOTE — Telephone Encounter (Signed)
Copied from [place] [id number]. Topic: Quick Communication - Office Called Patient >> Jan 20, 2018 10:23 AM Luana Shu, Amy P wrote: Returned daughter's call for having HHPT assess patient again as she is getting weaker.  She states patient had HHPT thru [facility] several months ago but they have to walk with her when she is up due to weakness.  Can she get order for HHPT restarted?

## 2018-02-02 ENCOUNTER — Other Ambulatory Visit: Payer: Self-pay | Admitting: Family Medicine

## 2018-02-02 DIAGNOSIS — I208 Other forms of angina pectoris: Secondary | ICD-10-CM

## 2018-02-02 DIAGNOSIS — N185 Chronic kidney disease, stage 5: Secondary | ICD-10-CM

## 2018-03-29 ENCOUNTER — Ambulatory Visit (INDEPENDENT_AMBULATORY_CARE_PROVIDER_SITE_OTHER): Payer: Medicare Other | Admitting: Family Medicine

## 2018-03-29 ENCOUNTER — Encounter: Payer: Self-pay | Admitting: Family Medicine

## 2018-03-29 VITALS — BP 128/64 | HR 62 | Temp 97.8°F | Ht 60.0 in | Wt 105.5 lb

## 2018-03-29 DIAGNOSIS — I69351 Hemiplegia and hemiparesis following cerebral infarction affecting right dominant side: Secondary | ICD-10-CM

## 2018-03-29 DIAGNOSIS — I700 Atherosclerosis of aorta: Secondary | ICD-10-CM | POA: Diagnosis not present

## 2018-03-29 DIAGNOSIS — N185 Chronic kidney disease, stage 5: Secondary | ICD-10-CM

## 2018-03-29 DIAGNOSIS — I208 Other forms of angina pectoris: Secondary | ICD-10-CM | POA: Diagnosis not present

## 2018-03-29 DIAGNOSIS — M810 Age-related osteoporosis without current pathological fracture: Secondary | ICD-10-CM

## 2018-03-29 DIAGNOSIS — I2510 Atherosclerotic heart disease of native coronary artery without angina pectoris: Secondary | ICD-10-CM

## 2018-03-29 DIAGNOSIS — N2581 Secondary hyperparathyroidism of renal origin: Secondary | ICD-10-CM | POA: Diagnosis not present

## 2018-03-29 DIAGNOSIS — D638 Anemia in other chronic diseases classified elsewhere: Secondary | ICD-10-CM

## 2018-03-29 DIAGNOSIS — F09 Unspecified mental disorder due to known physiological condition: Secondary | ICD-10-CM | POA: Diagnosis not present

## 2018-03-29 DIAGNOSIS — I2089 Other forms of angina pectoris: Secondary | ICD-10-CM

## 2018-03-29 DIAGNOSIS — E039 Hypothyroidism, unspecified: Secondary | ICD-10-CM

## 2018-03-29 DIAGNOSIS — M109 Gout, unspecified: Secondary | ICD-10-CM

## 2018-03-29 DIAGNOSIS — R2681 Unsteadiness on feet: Secondary | ICD-10-CM

## 2018-03-29 DIAGNOSIS — E785 Hyperlipidemia, unspecified: Secondary | ICD-10-CM | POA: Diagnosis not present

## 2018-03-29 DIAGNOSIS — I69322 Dysarthria following cerebral infarction: Secondary | ICD-10-CM | POA: Diagnosis not present

## 2018-03-29 DIAGNOSIS — D692 Other nonthrombocytopenic purpura: Secondary | ICD-10-CM

## 2018-03-29 DIAGNOSIS — M170 Bilateral primary osteoarthritis of knee: Secondary | ICD-10-CM | POA: Diagnosis not present

## 2018-03-29 NOTE — Progress Notes (Signed)
Name: Crystal Faulkner   MRN: [id number]    DOB: 1925/07/18   Date:03/29/2018       Progress Note  Subjective  Chief Complaint  Chief Complaint  Patient presents with  . Medication Refill    4 month F/U  . Hypertension  . Gastroesophageal Reflux  . Dementia  . Gout  . Referral    patient needs referral for home P/T with [facility] (regarding strengthening)    HPI   CVA : She had a CVA back in October 2017, she has dysarthria , right  side hemiparesisshe uses a walker now.Family has to crush her medications and mix in apple sauce. Personality seems the same. She does not speak, but nods her head and smiles. She points to body parts. Still under daughter's care, they are not interested in home health for ADL but willing to get help with ambulation since it seems to be getting worse  Hypothyroidism: taking medication daily as prescribed, no constipation, she has dry skin. Last TSH at goal, recheck today   Senile purpura: bruises easily and takes aspirin, explained common to have it at her age. Stable  HTN: bp is towards low end of normal, not sure if she gets dizzy,   she has ESRD and is under the care of nephrologist   Angina: taking Imdur daily and denies chest pain, also taking statin therapy on plavix. She sees cardiologist - Dr. Saralyn Pilar in September. She does not seem short of breath, no orthopnea.   GERD: under control, patient seems comfortable.   Dementia: stable, no wondering, able to take shower, eats by herself, but needs help coming to the doctor and family member manages her money and dispense her medication. Neurologist offered to start on medication, but family prefers holding off on that, stable symptoms. She has been stable and daughter retired and gives 24 hour care.    CKI with anemia or chronic disease: seeing nephrologist, refuses HD, no pruritus.Daughter asked to get labs done prior to visit with nephrologist. Lissa Merlin)   Controlled Gout: no  recent episodes, recheck level   Atherosclerosis aorta: taking aspirin and statin therapy  Patient Active Problem List   Diagnosis Date Noted  . Osteoarthritis 11/19/2017  . Malnutrition of moderate degree 02/28/2017  . Altered mental status 02/26/2017  . Dysarthria due to recent cerebrovascular accident (CVA) 06/23/2016  . Hemiparesis affecting right side as late effect of cerebrovascular accident (CVA) ([place]) 06/23/2016  . Hypotension 06/23/2016  . CKD (chronic kidney disease) stage 5, GFR less than 15 ml/min (HCC) 06/23/2016  . Anemia of chronic disease 06/23/2016  . Encephalopathy, metabolic 92/42/6834  . Intermittent confusion 06/21/2016  . Senile purpura ([place]) 05/13/2016  . Hiatal hernia 05/04/2016  . Thoracic aorta atherosclerosis ([place]) 05/04/2016  . Chronic stable angina ([place]) 10/16/2015  . Secondary hyperparathyroidism ([place]) 04/13/2015  . Arteriosclerosis of coronary artery 04/08/2015  . Benign hypertension 04/08/2015  . Chronic kidney disease (CKD), stage V ([place]) 04/08/2015  . Controlled gout 04/08/2015  . Dyslipidemia 04/08/2015  . History of peptic ulcer disease 04/08/2015  . Adult hypothyroidism 04/08/2015  . Mild cognitive disorder 04/08/2015  . Anemia of chronic disease 04/08/2015  . Osteoarthrosis involving more than one site but not generalized 04/08/2015  . Osteoporosis 04/08/2015  . Pelvic muscle wasting 04/08/2015  . Allergic rhinitis 04/08/2015  . Tobacco use 04/08/2015  . CHF (congestive heart failure) ([place]) 03/29/2014  . HLD (hyperlipidemia) 03/29/2014  . H/O gastrointestinal hemorrhage 04/05/2007    Past Surgical  History:  Procedure Laterality Date  . EXPLORATORY LAPAROTOMY    . EYE SURGERY Bilateral    cataract    Family History  Problem Relation Age of Onset  . Diabetes Sister   . Hypertension Sister   . Hypertension Brother   . Heart disease Brother     Social History   Socioeconomic History  . Marital status: Widowed    Spouse  name: Not on file  . Number of children: 6  . Years of education: Not on file  . Highest education level: 8th grade  Occupational History  . Occupation: Retired  Scientific laboratory technician  . Financial resource strain: Not hard at all  . Food insecurity:    Worry: Never true    Inability: Never true  . Transportation needs:    Medical: No    Non-medical: No  Tobacco Use  . Smoking status: Never Smoker  . Smokeless tobacco: Current User    Types: Snuff  . Tobacco comment: smoking cessation materials not requuired  Substance and Sexual Activity  . Alcohol use: No  . Drug use: No  . Sexual activity: Never  Lifestyle  . Physical activity:    Days per week: 0 days    Minutes per session: 0 min  . Stress: Not at all  Relationships  . Social connections:    Talks on phone: Never    Gets together: Twice a week    Attends religious service: 1 to 4 times per year    Active member of club or organization: No    Attends meetings of clubs or organizations: Never    Relationship status: Widowed  . Intimate partner violence:    Fear of current or ex partner: No    Emotionally abused: No    Physically abused: No    Forced sexual activity: No  Other Topics Concern  . Not on file  Social History Narrative   She lives with her daughter.    She also has her nephew and niece at home.    Had a stroke October 2017 and has difficulty walking and also unable to talk since.          Current Outpatient Medications:  .  allopurinol (ZYLOPRIM) 100 MG tablet, TAKE 1 TABLET (100 MG TOTAL) BY MOUTH DAILY., Disp: 90 tablet, Rfl: 2 .  Cholecalciferol (VITAMIN D3) 2000 UNITS capsule, Take 1 capsule by mouth daily., Disp: , Rfl:  .  clopidogrel (PLAVIX) 75 MG tablet, TAKE 1 TABLET BY MOUTH EVERY DAY, Disp: 90 tablet, Rfl: 1 .  ferrous sulfate 325 (65 FE) MG tablet, Take 325 mg by mouth 2 (two) times daily with a meal., Disp: , Rfl:  .  furosemide (LASIX) 40 MG tablet, TAKE 1 TABLET BY MOUTH EVERY DAY, Disp: 90  tablet, Rfl: 0 .  isosorbide mononitrate (IMDUR) 60 MG 24 hr tablet, TAKE 1 TABLET BY MOUTH EVERY DAY, Disp: 90 tablet, Rfl: 2 .  levothyroxine (SYNTHROID, LEVOTHROID) 75 MCG tablet, Take 1 tablet (75 mcg total) by mouth daily at 6 (six) AM., Disp: 90 tablet, Rfl: 1 .  pantoprazole (PROTONIX) 40 MG tablet, TAKE 1 TABLET BY MOUTH EVERY DAY, Disp: 90 tablet, Rfl: 1 .  raloxifene (EVISTA) 60 MG tablet, Take 1 tablet (60 mg total) by mouth daily., Disp: 90 tablet, Rfl: 2 .  rosuvastatin (CRESTOR) 10 MG tablet, Take 1 tablet (10 mg total) by mouth daily., Disp: 90 tablet, Rfl: 1 .  sodium bicarbonate 650 MG tablet, TAKE 2 TABLETS (  1,300 MG TOTAL) BY MOUTH TWO (2) TIMES A DAY., Disp: , Rfl: 11  No Known Allergies   ROS  Constitutional: Negative for fever positive for some  weight change ( but stable since last year) .  Respiratory: Negative for cough and shortness of breath.   Cardiovascular: Negative for chest pain or palpitations.  Gastrointestinal: Negative for abdominal pain, no bowel changes.  Musculoskeletal: Positive  for gait problem and right knee  joint swelling.  Skin: Negative for rash.  Neurological: Negative for dizziness or headache.  No other specific complaints in a complete review of systems (except as listed in HPI above).   Objective  Vitals:   03/29/18 1153  BP: 128/64  Pulse: 62  Temp: 97.8 F (36.6 C)  TempSrc: Oral  SpO2: 99%  Weight: 105 lb 8 oz (47.9 kg)  Height: 5' (1.524 m)    Body mass index is 20.6 kg/m.  Physical Exam  Constitutional: Patient appears well-developed and frail  No distress.  HEENT: head atraumatic, normocephalic, pupils equal and reactive to light,  neck supple, throat within normal limits Cardiovascular: Normal rate, regular rhythm and normal heart sounds.  No murmur heard. No BLE edema. Pulmonary/Chest: Effort normal and breath sounds normal. No respiratory distress. Abdominal: Soft.  There is no tenderness. Muscular Skeletal:  genu valgum on right knee, crepitus with extension, no effusion today, using walker to assist ambulation  Psychiatric: Patient has a normal mood and affect.Calm and cooperative, seems happy   PHQ2/9: Depression screen [facility] 2/9 03/29/2018 11/27/2017 11/27/2017 06/15/2017 03/13/2017  Decreased Interest 0 0 0 0 0  Down, Depressed, Hopeless 0 0 0 0 0  PHQ - 2 Score 0 0 0 0 0  Altered sleeping 3 0 0 - -  Tired, decreased energy 0 0 0 - -  Change in appetite 0 0 0 - -  Feeling bad or failure about yourself  0 0 0 - -  Trouble concentrating 0 0 0 - -  Moving slowly or fidgety/restless 0 0 0 - -  Suicidal thoughts 0 0 0 - -  PHQ-9 Score 3 0 0 - -  Difficult doing work/chores Not difficult at all Not difficult at all Not difficult at all - -     Fall Risk: Fall Risk  03/29/2018 11/27/2017 06/15/2017 03/13/2017 06/26/2016  Falls in the past year? Yes Yes Yes Yes Yes  Comment - shuffling and unsteady gait - - -  Number falls in past yr: 2 or more 2 or more 2 or more 2 or more 2 or more  Injury with Fall? Yes Yes Yes No No  Comment - skin tears - - -  Risk Factor Category  High Fall Risk High Fall Risk - - -  Risk for fall due to : History of fall(s);Impaired balance/gait;Impaired mobility History of fall(s);Impaired balance/gait;Impaired vision;Impaired mobility - Impaired balance/gait -  Risk for fall due to: Comment - h/o fractures; occular implants; use of walker; shuffling, unsteady gait - - -  Follow up Education provided Falls evaluation completed;Education provided;Falls prevention discussed - - -      Functional Status Survey: Is the patient deaf or have difficulty hearing?: No Does the patient have difficulty seeing, even when wearing glasses/contacts?: No Does the patient have difficulty concentrating, remembering, or making decisions?: No Does the patient have difficulty walking or climbing stairs?: Yes Does the patient have difficulty dressing or bathing?: Yes Does the patient have  difficulty doing errands alone such as visiting a doctor's office  or shopping?: Yes   Assessment & Plan  1. Hemiparesis affecting right side as late effect of cerebrovascular accident (CVA) ([place])  Stable   2. Chronic stable angina ([place])  She denies pain at this time  3. Dysarthria as late effect of cerebrovascular accident (CVA)  Only mumbles and nods her head since stroke   4. Adult hypothyroidism  - TSH  5. Chronic kidney disease (CKD), stage V (HCC)  Sees Dr. Johnney Ou - CBC with Differential/Platelet - VITAMIN D 25 Hydroxy (Vit-D Deficiency, Fractures) - COMPLETE METABOLIC PANEL WITH GFR - Parathyroid hormone, intact (no Ca) - Urine Microalbumin w/creat. ratio  6. Senile purpura (HCC)  Stable   7. Secondary hyperparathyroidism ([place])  - Parathyroid hormone, intact (no Ca)  8. Thoracic aorta atherosclerosis ([place])  On statin therapy   9. Controlled gout  - uric acid  10. Anemia of chronic disease  - CBC with Differential/Platelet  11. Primary osteoarthritis of both knees  - Ambulatory referral to [place]. Mild cognitive disorder   13. Osteoporosis without current pathological fracture, unspecified osteoporosis type  Cannot take diphosphonate because of CKI  14. Dyslipidemia  - Lipid panel  15. Gait instability  - Ambulatory referral to [address]

## 2018-03-30 LAB — CBC WITH DIFFERENTIAL/PLATELET
BASOS PCT: 1 %
Basophils Absolute: 60 cells/uL (ref 0–200)
EOS ABS: 174 {cells}/uL (ref 15–500)
Eosinophils Relative: 2.9 %
HCT: 34.7 % — ABNORMAL LOW (ref 35.0–45.0)
HEMOGLOBIN: 11.4 g/dL — AB (ref 11.7–15.5)
LYMPHS ABS: 1200 {cells}/uL (ref 850–3900)
MCH: 30.1 pg (ref 27.0–33.0)
MCHC: 32.9 g/dL (ref 32.0–36.0)
MCV: 91.6 fL (ref 80.0–100.0)
MPV: 10.8 fL (ref 7.5–12.5)
Monocytes Relative: 7.1 %
NEUTROS ABS: 4140 {cells}/uL (ref 1500–7800)
Neutrophils Relative %: 69 %
Platelets: 227 10*3/uL (ref 140–400)
RBC: 3.79 10*6/uL — ABNORMAL LOW (ref 3.80–5.10)
RDW: 14.2 % (ref 11.0–15.0)
Total Lymphocyte: 20 %
WBC: 6 10*3/uL (ref 3.8–10.8)
WBCMIX: 426 {cells}/uL (ref 200–950)

## 2018-03-30 LAB — COMPLETE METABOLIC PANEL WITH GFR
AG RATIO: 1.4 (calc) (ref 1.0–2.5)
ALBUMIN MSPROF: 3.9 g/dL (ref 3.6–5.1)
ALKALINE PHOSPHATASE (APISO): 84 U/L (ref 33–130)
ALT: 7 U/L (ref 6–29)
AST: 13 U/L (ref 10–35)
BILIRUBIN TOTAL: 0.4 mg/dL (ref 0.2–1.2)
BUN / CREAT RATIO: 11 (calc) (ref 6–22)
BUN: 39 mg/dL — ABNORMAL HIGH (ref 7–25)
CHLORIDE: 107 mmol/L (ref 98–110)
CO2: 29 mmol/L (ref 20–32)
CREATININE: 3.43 mg/dL — AB (ref 0.60–0.88)
Calcium: 9.2 mg/dL (ref 8.6–10.4)
GFR, Est African American: 13 mL/min/{1.73_m2} — ABNORMAL LOW (ref >=60)
GFR, Est Non African American: 11 mL/min/{1.73_m2} — ABNORMAL LOW (ref >=60)
GLOBULIN: 2.7 g/dL (ref 1.9–3.7)
Glucose, Bld: 81 mg/dL (ref 65–99)
POTASSIUM: 3.9 mmol/L (ref 3.5–5.3)
SODIUM: 145 mmol/L (ref 135–146)
Total Protein: 6.6 g/dL (ref 6.1–8.1)

## 2018-03-30 LAB — PARATHYROID HORMONE, INTACT (NO CA): PTH: 571 pg/mL — AB (ref 14–64)

## 2018-03-30 LAB — TSH: TSH: 0.32 m[IU]/L — AB (ref 0.40–4.50)

## 2018-03-30 LAB — MICROALBUMIN / CREATININE URINE RATIO
CREATININE, URINE: 66 mg/dL (ref 20–275)
Microalb Creat Ratio: 92 mcg/mg creat — ABNORMAL HIGH (ref <30)
Microalb, Ur: 6.1 mg/dL

## 2018-03-30 LAB — LIPID PANEL
CHOL/HDL RATIO: 2.3 (calc) (ref <5.0)
CHOLESTEROL: 148 mg/dL (ref <200)
HDL: 64 mg/dL (ref >50)
LDL CHOLESTEROL (CALC): 68 mg/dL
Non-HDL Cholesterol (Calc): 84 mg/dL (calc) (ref <130)
Triglycerides: 75 mg/dL (ref <150)

## 2018-03-30 LAB — VITAMIN D 25 HYDROXY (VIT D DEFICIENCY, FRACTURES): VIT D 25 HYDROXY: 47 ng/mL (ref 30–100)

## 2018-03-30 LAB — URIC ACID: URIC ACID, SERUM: 4.9 mg/dL (ref 2.5–7.0)

## 2018-03-31 ENCOUNTER — Other Ambulatory Visit: Payer: Self-pay | Admitting: Family Medicine

## 2018-03-31 DIAGNOSIS — E039 Hypothyroidism, unspecified: Secondary | ICD-10-CM

## 2018-04-03 DIAGNOSIS — I69322 Dysarthria following cerebral infarction: Secondary | ICD-10-CM | POA: Diagnosis not present

## 2018-04-03 DIAGNOSIS — I132 Hypertensive heart and chronic kidney disease with heart failure and with stage 5 chronic kidney disease, or end stage renal disease: Secondary | ICD-10-CM | POA: Diagnosis not present

## 2018-04-03 DIAGNOSIS — I25118 Atherosclerotic heart disease of native coronary artery with other forms of angina pectoris: Secondary | ICD-10-CM | POA: Diagnosis not present

## 2018-04-03 DIAGNOSIS — Z9181 History of falling: Secondary | ICD-10-CM | POA: Diagnosis not present

## 2018-04-03 DIAGNOSIS — M810 Age-related osteoporosis without current pathological fracture: Secondary | ICD-10-CM | POA: Diagnosis not present

## 2018-04-03 DIAGNOSIS — E039 Hypothyroidism, unspecified: Secondary | ICD-10-CM | POA: Diagnosis not present

## 2018-04-03 DIAGNOSIS — E211 Secondary hyperparathyroidism, not elsewhere classified: Secondary | ICD-10-CM | POA: Diagnosis not present

## 2018-04-03 DIAGNOSIS — E440 Moderate protein-calorie malnutrition: Secondary | ICD-10-CM | POA: Diagnosis not present

## 2018-04-03 DIAGNOSIS — I700 Atherosclerosis of aorta: Secondary | ICD-10-CM | POA: Diagnosis not present

## 2018-04-03 DIAGNOSIS — I509 Heart failure, unspecified: Secondary | ICD-10-CM | POA: Diagnosis not present

## 2018-04-03 DIAGNOSIS — N185 Chronic kidney disease, stage 5: Secondary | ICD-10-CM | POA: Diagnosis not present

## 2018-04-03 DIAGNOSIS — Z7902 Long term (current) use of antithrombotics/antiplatelets: Secondary | ICD-10-CM | POA: Diagnosis not present

## 2018-04-03 DIAGNOSIS — M109 Gout, unspecified: Secondary | ICD-10-CM | POA: Diagnosis not present

## 2018-04-03 DIAGNOSIS — F0390 Unspecified dementia without behavioral disturbance: Secondary | ICD-10-CM | POA: Diagnosis not present

## 2018-04-03 DIAGNOSIS — F17200 Nicotine dependence, unspecified, uncomplicated: Secondary | ICD-10-CM | POA: Diagnosis not present

## 2018-04-03 DIAGNOSIS — M170 Bilateral primary osteoarthritis of knee: Secondary | ICD-10-CM | POA: Diagnosis not present

## 2018-04-03 DIAGNOSIS — I69351 Hemiplegia and hemiparesis following cerebral infarction affecting right dominant side: Secondary | ICD-10-CM | POA: Diagnosis not present

## 2018-04-03 DIAGNOSIS — D638 Anemia in other chronic diseases classified elsewhere: Secondary | ICD-10-CM | POA: Diagnosis not present

## 2018-04-07 DIAGNOSIS — N185 Chronic kidney disease, stage 5: Secondary | ICD-10-CM | POA: Diagnosis not present

## 2018-04-07 DIAGNOSIS — I509 Heart failure, unspecified: Secondary | ICD-10-CM | POA: Diagnosis not present

## 2018-04-07 DIAGNOSIS — I69351 Hemiplegia and hemiparesis following cerebral infarction affecting right dominant side: Secondary | ICD-10-CM | POA: Diagnosis not present

## 2018-04-07 DIAGNOSIS — I69322 Dysarthria following cerebral infarction: Secondary | ICD-10-CM | POA: Diagnosis not present

## 2018-04-07 DIAGNOSIS — I132 Hypertensive heart and chronic kidney disease with heart failure and with stage 5 chronic kidney disease, or end stage renal disease: Secondary | ICD-10-CM | POA: Diagnosis not present

## 2018-04-07 DIAGNOSIS — M170 Bilateral primary osteoarthritis of knee: Secondary | ICD-10-CM | POA: Diagnosis not present

## 2018-04-09 DIAGNOSIS — I69322 Dysarthria following cerebral infarction: Secondary | ICD-10-CM | POA: Diagnosis not present

## 2018-04-09 DIAGNOSIS — I69351 Hemiplegia and hemiparesis following cerebral infarction affecting right dominant side: Secondary | ICD-10-CM | POA: Diagnosis not present

## 2018-04-09 DIAGNOSIS — N185 Chronic kidney disease, stage 5: Secondary | ICD-10-CM | POA: Diagnosis not present

## 2018-04-09 DIAGNOSIS — I509 Heart failure, unspecified: Secondary | ICD-10-CM | POA: Diagnosis not present

## 2018-04-09 DIAGNOSIS — M170 Bilateral primary osteoarthritis of knee: Secondary | ICD-10-CM | POA: Diagnosis not present

## 2018-04-09 DIAGNOSIS — I132 Hypertensive heart and chronic kidney disease with heart failure and with stage 5 chronic kidney disease, or end stage renal disease: Secondary | ICD-10-CM | POA: Diagnosis not present

## 2018-04-12 ENCOUNTER — Other Ambulatory Visit: Payer: Self-pay | Admitting: Family Medicine

## 2018-04-12 DIAGNOSIS — M109 Gout, unspecified: Secondary | ICD-10-CM

## 2018-04-14 DIAGNOSIS — I69351 Hemiplegia and hemiparesis following cerebral infarction affecting right dominant side: Secondary | ICD-10-CM | POA: Diagnosis not present

## 2018-04-14 DIAGNOSIS — I132 Hypertensive heart and chronic kidney disease with heart failure and with stage 5 chronic kidney disease, or end stage renal disease: Secondary | ICD-10-CM | POA: Diagnosis not present

## 2018-04-14 DIAGNOSIS — M170 Bilateral primary osteoarthritis of knee: Secondary | ICD-10-CM | POA: Diagnosis not present

## 2018-04-14 DIAGNOSIS — I69322 Dysarthria following cerebral infarction: Secondary | ICD-10-CM | POA: Diagnosis not present

## 2018-04-14 DIAGNOSIS — I509 Heart failure, unspecified: Secondary | ICD-10-CM | POA: Diagnosis not present

## 2018-04-14 DIAGNOSIS — N185 Chronic kidney disease, stage 5: Secondary | ICD-10-CM | POA: Diagnosis not present

## 2018-04-15 ENCOUNTER — Telehealth: Payer: Self-pay

## 2018-04-15 NOTE — Telephone Encounter (Signed)
Copied from [place] [phone]. Topic: General - Other >> Apr 14, 2018 11:35 AM Judyann Munson wrote: Reason for CRM: Patient  Crystal Faulkner with[facility]  is request a call back to  [phone],  in regards to a AFO  for her right foot, she needing for physical therapy. Please advise

## 2018-04-15 NOTE — Telephone Encounter (Signed)
Okay to give verbal order.

## 2018-04-16 DIAGNOSIS — I132 Hypertensive heart and chronic kidney disease with heart failure and with stage 5 chronic kidney disease, or end stage renal disease: Secondary | ICD-10-CM | POA: Diagnosis not present

## 2018-04-16 DIAGNOSIS — I69351 Hemiplegia and hemiparesis following cerebral infarction affecting right dominant side: Secondary | ICD-10-CM | POA: Diagnosis not present

## 2018-04-16 DIAGNOSIS — N185 Chronic kidney disease, stage 5: Secondary | ICD-10-CM | POA: Diagnosis not present

## 2018-04-16 DIAGNOSIS — M170 Bilateral primary osteoarthritis of knee: Secondary | ICD-10-CM | POA: Diagnosis not present

## 2018-04-16 DIAGNOSIS — I509 Heart failure, unspecified: Secondary | ICD-10-CM | POA: Diagnosis not present

## 2018-04-16 DIAGNOSIS — I69322 Dysarthria following cerebral infarction: Secondary | ICD-10-CM | POA: Diagnosis not present

## 2018-04-16 NOTE — Telephone Encounter (Signed)
Left message giving verbal orders from Dr. Ancil Boozer for PT for patient on Howell Pringle with [facility] voicemail.

## 2018-04-20 DIAGNOSIS — I69351 Hemiplegia and hemiparesis following cerebral infarction affecting right dominant side: Secondary | ICD-10-CM | POA: Diagnosis not present

## 2018-04-20 DIAGNOSIS — I132 Hypertensive heart and chronic kidney disease with heart failure and with stage 5 chronic kidney disease, or end stage renal disease: Secondary | ICD-10-CM | POA: Diagnosis not present

## 2018-04-20 DIAGNOSIS — I509 Heart failure, unspecified: Secondary | ICD-10-CM | POA: Diagnosis not present

## 2018-04-20 DIAGNOSIS — I69322 Dysarthria following cerebral infarction: Secondary | ICD-10-CM | POA: Diagnosis not present

## 2018-04-20 DIAGNOSIS — M170 Bilateral primary osteoarthritis of knee: Secondary | ICD-10-CM | POA: Diagnosis not present

## 2018-04-20 DIAGNOSIS — N185 Chronic kidney disease, stage 5: Secondary | ICD-10-CM | POA: Diagnosis not present

## 2018-04-21 DIAGNOSIS — I69351 Hemiplegia and hemiparesis following cerebral infarction affecting right dominant side: Secondary | ICD-10-CM | POA: Diagnosis not present

## 2018-04-21 DIAGNOSIS — I509 Heart failure, unspecified: Secondary | ICD-10-CM | POA: Diagnosis not present

## 2018-04-21 DIAGNOSIS — N185 Chronic kidney disease, stage 5: Secondary | ICD-10-CM | POA: Diagnosis not present

## 2018-04-21 DIAGNOSIS — I132 Hypertensive heart and chronic kidney disease with heart failure and with stage 5 chronic kidney disease, or end stage renal disease: Secondary | ICD-10-CM | POA: Diagnosis not present

## 2018-04-21 DIAGNOSIS — M170 Bilateral primary osteoarthritis of knee: Secondary | ICD-10-CM | POA: Diagnosis not present

## 2018-04-21 DIAGNOSIS — I69322 Dysarthria following cerebral infarction: Secondary | ICD-10-CM | POA: Diagnosis not present

## 2018-04-22 DIAGNOSIS — I69351 Hemiplegia and hemiparesis following cerebral infarction affecting right dominant side: Secondary | ICD-10-CM | POA: Diagnosis not present

## 2018-04-22 DIAGNOSIS — M170 Bilateral primary osteoarthritis of knee: Secondary | ICD-10-CM | POA: Diagnosis not present

## 2018-04-22 DIAGNOSIS — N185 Chronic kidney disease, stage 5: Secondary | ICD-10-CM | POA: Diagnosis not present

## 2018-04-22 DIAGNOSIS — D638 Anemia in other chronic diseases classified elsewhere: Secondary | ICD-10-CM | POA: Diagnosis not present

## 2018-04-22 DIAGNOSIS — I120 Hypertensive chronic kidney disease with stage 5 chronic kidney disease or end stage renal disease: Secondary | ICD-10-CM | POA: Diagnosis not present

## 2018-04-22 DIAGNOSIS — I69322 Dysarthria following cerebral infarction: Secondary | ICD-10-CM | POA: Diagnosis not present

## 2018-04-22 DIAGNOSIS — I509 Heart failure, unspecified: Secondary | ICD-10-CM | POA: Diagnosis not present

## 2018-04-22 DIAGNOSIS — M1990 Unspecified osteoarthritis, unspecified site: Secondary | ICD-10-CM | POA: Diagnosis not present

## 2018-04-22 DIAGNOSIS — I132 Hypertensive heart and chronic kidney disease with heart failure and with stage 5 chronic kidney disease, or end stage renal disease: Secondary | ICD-10-CM | POA: Diagnosis not present

## 2018-04-27 DIAGNOSIS — I509 Heart failure, unspecified: Secondary | ICD-10-CM | POA: Diagnosis not present

## 2018-04-27 DIAGNOSIS — I132 Hypertensive heart and chronic kidney disease with heart failure and with stage 5 chronic kidney disease, or end stage renal disease: Secondary | ICD-10-CM | POA: Diagnosis not present

## 2018-04-27 DIAGNOSIS — I69322 Dysarthria following cerebral infarction: Secondary | ICD-10-CM | POA: Diagnosis not present

## 2018-04-27 DIAGNOSIS — M170 Bilateral primary osteoarthritis of knee: Secondary | ICD-10-CM | POA: Diagnosis not present

## 2018-04-27 DIAGNOSIS — N185 Chronic kidney disease, stage 5: Secondary | ICD-10-CM | POA: Diagnosis not present

## 2018-04-27 DIAGNOSIS — I69351 Hemiplegia and hemiparesis following cerebral infarction affecting right dominant side: Secondary | ICD-10-CM | POA: Diagnosis not present

## 2018-04-29 DIAGNOSIS — M170 Bilateral primary osteoarthritis of knee: Secondary | ICD-10-CM | POA: Diagnosis not present

## 2018-04-29 DIAGNOSIS — I132 Hypertensive heart and chronic kidney disease with heart failure and with stage 5 chronic kidney disease, or end stage renal disease: Secondary | ICD-10-CM | POA: Diagnosis not present

## 2018-04-29 DIAGNOSIS — I509 Heart failure, unspecified: Secondary | ICD-10-CM | POA: Diagnosis not present

## 2018-04-29 DIAGNOSIS — I69322 Dysarthria following cerebral infarction: Secondary | ICD-10-CM | POA: Diagnosis not present

## 2018-04-29 DIAGNOSIS — N185 Chronic kidney disease, stage 5: Secondary | ICD-10-CM | POA: Diagnosis not present

## 2018-04-29 DIAGNOSIS — I69351 Hemiplegia and hemiparesis following cerebral infarction affecting right dominant side: Secondary | ICD-10-CM | POA: Diagnosis not present

## 2018-05-04 DIAGNOSIS — N185 Chronic kidney disease, stage 5: Secondary | ICD-10-CM | POA: Diagnosis not present

## 2018-05-04 DIAGNOSIS — I69351 Hemiplegia and hemiparesis following cerebral infarction affecting right dominant side: Secondary | ICD-10-CM | POA: Diagnosis not present

## 2018-05-04 DIAGNOSIS — I69322 Dysarthria following cerebral infarction: Secondary | ICD-10-CM | POA: Diagnosis not present

## 2018-05-04 DIAGNOSIS — M170 Bilateral primary osteoarthritis of knee: Secondary | ICD-10-CM | POA: Diagnosis not present

## 2018-05-04 DIAGNOSIS — I509 Heart failure, unspecified: Secondary | ICD-10-CM | POA: Diagnosis not present

## 2018-05-04 DIAGNOSIS — I132 Hypertensive heart and chronic kidney disease with heart failure and with stage 5 chronic kidney disease, or end stage renal disease: Secondary | ICD-10-CM | POA: Diagnosis not present

## 2018-05-06 DIAGNOSIS — I132 Hypertensive heart and chronic kidney disease with heart failure and with stage 5 chronic kidney disease, or end stage renal disease: Secondary | ICD-10-CM | POA: Diagnosis not present

## 2018-05-06 DIAGNOSIS — I69351 Hemiplegia and hemiparesis following cerebral infarction affecting right dominant side: Secondary | ICD-10-CM | POA: Diagnosis not present

## 2018-05-06 DIAGNOSIS — I69322 Dysarthria following cerebral infarction: Secondary | ICD-10-CM | POA: Diagnosis not present

## 2018-05-06 DIAGNOSIS — N185 Chronic kidney disease, stage 5: Secondary | ICD-10-CM | POA: Diagnosis not present

## 2018-05-06 DIAGNOSIS — I509 Heart failure, unspecified: Secondary | ICD-10-CM | POA: Diagnosis not present

## 2018-05-06 DIAGNOSIS — M170 Bilateral primary osteoarthritis of knee: Secondary | ICD-10-CM | POA: Diagnosis not present

## 2018-05-07 DIAGNOSIS — R4701 Aphasia: Secondary | ICD-10-CM | POA: Diagnosis not present

## 2018-05-07 DIAGNOSIS — I63311 Cerebral infarction due to thrombosis of right middle cerebral artery: Secondary | ICD-10-CM | POA: Diagnosis not present

## 2018-05-07 DIAGNOSIS — R296 Repeated falls: Secondary | ICD-10-CM | POA: Diagnosis not present

## 2018-05-07 DIAGNOSIS — R410 Disorientation, unspecified: Secondary | ICD-10-CM | POA: Diagnosis not present

## 2018-05-08 ENCOUNTER — Other Ambulatory Visit: Payer: Self-pay | Admitting: Family Medicine

## 2018-05-08 DIAGNOSIS — I208 Other forms of angina pectoris: Secondary | ICD-10-CM

## 2018-05-08 DIAGNOSIS — N185 Chronic kidney disease, stage 5: Secondary | ICD-10-CM

## 2018-05-11 DIAGNOSIS — N185 Chronic kidney disease, stage 5: Secondary | ICD-10-CM | POA: Diagnosis not present

## 2018-05-11 DIAGNOSIS — M170 Bilateral primary osteoarthritis of knee: Secondary | ICD-10-CM | POA: Diagnosis not present

## 2018-05-11 DIAGNOSIS — I69322 Dysarthria following cerebral infarction: Secondary | ICD-10-CM | POA: Diagnosis not present

## 2018-05-11 DIAGNOSIS — I69351 Hemiplegia and hemiparesis following cerebral infarction affecting right dominant side: Secondary | ICD-10-CM | POA: Diagnosis not present

## 2018-05-11 DIAGNOSIS — I509 Heart failure, unspecified: Secondary | ICD-10-CM | POA: Diagnosis not present

## 2018-05-11 DIAGNOSIS — I132 Hypertensive heart and chronic kidney disease with heart failure and with stage 5 chronic kidney disease, or end stage renal disease: Secondary | ICD-10-CM | POA: Diagnosis not present

## 2018-05-13 DIAGNOSIS — N185 Chronic kidney disease, stage 5: Secondary | ICD-10-CM | POA: Diagnosis not present

## 2018-05-13 DIAGNOSIS — I69322 Dysarthria following cerebral infarction: Secondary | ICD-10-CM | POA: Diagnosis not present

## 2018-05-13 DIAGNOSIS — I69351 Hemiplegia and hemiparesis following cerebral infarction affecting right dominant side: Secondary | ICD-10-CM | POA: Diagnosis not present

## 2018-05-13 DIAGNOSIS — M170 Bilateral primary osteoarthritis of knee: Secondary | ICD-10-CM | POA: Diagnosis not present

## 2018-05-13 DIAGNOSIS — I509 Heart failure, unspecified: Secondary | ICD-10-CM | POA: Diagnosis not present

## 2018-05-13 DIAGNOSIS — I132 Hypertensive heart and chronic kidney disease with heart failure and with stage 5 chronic kidney disease, or end stage renal disease: Secondary | ICD-10-CM | POA: Diagnosis not present

## 2018-05-20 ENCOUNTER — Other Ambulatory Visit: Payer: Self-pay | Admitting: Family Medicine

## 2018-05-20 DIAGNOSIS — M810 Age-related osteoporosis without current pathological fracture: Secondary | ICD-10-CM

## 2018-05-20 DIAGNOSIS — E039 Hypothyroidism, unspecified: Secondary | ICD-10-CM

## 2018-05-20 NOTE — Telephone Encounter (Signed)
She needs to have TSH done today, it was suppressed and I need to make sure dose is correct. TSH wa already ordered , we can send enough until Monday if needed

## 2018-05-20 NOTE — Telephone Encounter (Signed)
Left message instructing patient to have blood work done today.

## 2018-05-21 DIAGNOSIS — E039 Hypothyroidism, unspecified: Secondary | ICD-10-CM | POA: Diagnosis not present

## 2018-05-21 LAB — TSH: TSH: 2.52 mIU/L (ref 0.40–4.50)

## 2018-05-23 ENCOUNTER — Other Ambulatory Visit: Payer: Self-pay | Admitting: Family Medicine

## 2018-05-24 ENCOUNTER — Other Ambulatory Visit: Payer: Self-pay

## 2018-05-24 DIAGNOSIS — E039 Hypothyroidism, unspecified: Secondary | ICD-10-CM

## 2018-05-24 MED ORDER — LEVOTHYROXINE SODIUM 75 MCG PO TABS: 75.0000 ug | ORAL_TABLET | Freq: Every day | ORAL | 1 refills | Status: DC

## 2018-06-08 ENCOUNTER — Ambulatory Visit (INDEPENDENT_AMBULATORY_CARE_PROVIDER_SITE_OTHER): Payer: Medicare Other

## 2018-06-08 DIAGNOSIS — E7800 Pure hypercholesterolemia, unspecified: Secondary | ICD-10-CM | POA: Diagnosis not present

## 2018-06-08 DIAGNOSIS — Z23 Encounter for immunization: Secondary | ICD-10-CM

## 2018-06-08 DIAGNOSIS — N185 Chronic kidney disease, stage 5: Secondary | ICD-10-CM | POA: Diagnosis not present

## 2018-06-08 DIAGNOSIS — R0602 Shortness of breath: Secondary | ICD-10-CM | POA: Diagnosis not present

## 2018-06-08 DIAGNOSIS — I10 Essential (primary) hypertension: Secondary | ICD-10-CM | POA: Diagnosis not present

## 2018-06-08 DIAGNOSIS — I5030 Unspecified diastolic (congestive) heart failure: Secondary | ICD-10-CM | POA: Diagnosis not present

## 2018-06-08 DIAGNOSIS — I63311 Cerebral infarction due to thrombosis of right middle cerebral artery: Secondary | ICD-10-CM | POA: Diagnosis not present

## 2018-06-08 DIAGNOSIS — I2510 Atherosclerotic heart disease of native coronary artery without angina pectoris: Secondary | ICD-10-CM | POA: Diagnosis not present

## 2018-06-18 ENCOUNTER — Other Ambulatory Visit: Payer: Self-pay | Admitting: Family Medicine

## 2018-07-02 ENCOUNTER — Telehealth: Payer: Self-pay

## 2018-07-02 NOTE — Telephone Encounter (Signed)
Copied from [place] [id number]. Topic: General - Other >> Jul 01, 2018  3:24 PM Judyann Munson wrote: Rosibel Giacobbe is calling for a update on FMLA paperwork she turned in on last Thursday. Please advise

## 2018-07-04 ENCOUNTER — Other Ambulatory Visit: Payer: Self-pay | Admitting: Family Medicine

## 2018-07-04 DIAGNOSIS — I69351 Hemiplegia and hemiparesis following cerebral infarction affecting right dominant side: Secondary | ICD-10-CM

## 2018-07-04 DIAGNOSIS — I208 Other forms of angina pectoris: Secondary | ICD-10-CM

## 2018-07-04 DIAGNOSIS — I69322 Dysarthria following cerebral infarction: Secondary | ICD-10-CM

## 2018-07-04 DIAGNOSIS — I2510 Atherosclerotic heart disease of native coronary artery without angina pectoris: Secondary | ICD-10-CM

## 2018-07-06 ENCOUNTER — Telehealth: Payer: Self-pay

## 2018-07-06 NOTE — Telephone Encounter (Signed)
I already forwarded the other message to Tiffany since she was the one handling this.

## 2018-07-06 NOTE — Telephone Encounter (Signed)
Patients daughter is calling in wanting to know if paperwork was received for FMLA to be redone ?

## 2018-07-06 NOTE — Telephone Encounter (Signed)
Copied from [place] [phone]. Topic: General - Inquiry >> Jul 06, 2018 11:00 AM Bea Graff, NT wrote: Reason for CRM: Pts daughter states she would like to speak with the wellness nurse about getting financial assistance to help care for her mom as she is the caregiver. Please advise.   Returned daughter Crystal Faulkner call regarding financial assistance. States she was told by [facility] there are funds available for caregivers. 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 I asked if there was anything specific they needed assistance with; food, housing, utility bills, medications etc and she said no, they are all good with that but she just needs more money. Advised I would check with care guide for resources and have her contact them to follow up. Thank you.

## 2018-07-06 NOTE — Telephone Encounter (Signed)
It was one last week

## 2018-07-06 NOTE — Telephone Encounter (Signed)
Copied from [place] [phone]. Topic: General - Other >> Jul 01, 2018  3:24 PM Judyann Munson wrote: Rhiana Morash is calling for a update on FMLA paperwork she turned in on last Thursday. Please advise >> Jul 06, 2018  8:28 AM Vernona Rieger wrote: Patient's daughter, [name] called and wanted to know if the office had received the [facility] paperwork back. She states that [employer] contacted her and said it was filled out incorrectly. Please advise.   Tiffany,  I do not see anything scanned in, do you know anything about it?

## 2018-07-12 ENCOUNTER — Telehealth: Payer: Self-pay

## 2018-07-12 NOTE — Telephone Encounter (Signed)
Different form, patient and the beneficiary need to come in to fill it out

## 2018-07-12 NOTE — Telephone Encounter (Signed)
Copied from [place] [id number]. Topic: Quick Communication - Other Results (Clinic Use ONLY) >> Jul 09, 2018  4:51 PM Karene Fry P wrote: The FMLA paperwork that was filled out was incorrect. [employer] is going to fax the correct forms and daughter custodio) is asking to please fill out the correct forms. [employer] told akeelah that they have faxed over the correct forms twice and have not heard anything.

## 2018-07-13 NOTE — Telephone Encounter (Signed)
Patient and her daughter are scheduled to come in on 07/15/18 for the form to be filled out.

## 2018-07-14 NOTE — Telephone Encounter (Signed)
Pt daughter lynann cancelled tomorrow appt . Pt daughter said [employer] is missing duration of time etc. [name] slade her daughter would like md to complete the fmla like she did last year and fax to [employer]

## 2018-07-15 ENCOUNTER — Ambulatory Visit: Payer: Self-pay | Admitting: Family Medicine

## 2018-07-15 NOTE — Telephone Encounter (Addendum)
The form is different, we sent one form and they sent [place] a different one to fill out,  I recommend her follow up

## 2018-07-19 NOTE — Telephone Encounter (Signed)
Left message at 9:02 [place] (daugther) to schedule appt PER Dr Ancil Boozer. Pt does have appt for December so if Dr Ancil Boozer is booked before then she may need to keep that appt.

## 2018-07-23 ENCOUNTER — Ambulatory Visit: Payer: Self-pay | Admitting: Family Medicine

## 2018-08-02 ENCOUNTER — Ambulatory Visit (INDEPENDENT_AMBULATORY_CARE_PROVIDER_SITE_OTHER): Payer: Medicare Other | Admitting: Family Medicine

## 2018-08-02 ENCOUNTER — Encounter: Payer: Self-pay | Admitting: Family Medicine

## 2018-08-02 VITALS — BP 118/64 | HR 65 | Temp 97.7°F | Resp 14 | Ht 60.0 in | Wt 115.8 lb

## 2018-08-02 DIAGNOSIS — I69322 Dysarthria following cerebral infarction: Secondary | ICD-10-CM | POA: Diagnosis not present

## 2018-08-02 DIAGNOSIS — D692 Other nonthrombocytopenic purpura: Secondary | ICD-10-CM | POA: Diagnosis not present

## 2018-08-02 DIAGNOSIS — I208 Other forms of angina pectoris: Secondary | ICD-10-CM | POA: Diagnosis not present

## 2018-08-02 DIAGNOSIS — N185 Chronic kidney disease, stage 5: Secondary | ICD-10-CM | POA: Diagnosis not present

## 2018-08-02 DIAGNOSIS — I69351 Hemiplegia and hemiparesis following cerebral infarction affecting right dominant side: Secondary | ICD-10-CM | POA: Diagnosis not present

## 2018-08-02 DIAGNOSIS — I5032 Chronic diastolic (congestive) heart failure: Secondary | ICD-10-CM

## 2018-08-02 DIAGNOSIS — E039 Hypothyroidism, unspecified: Secondary | ICD-10-CM | POA: Diagnosis not present

## 2018-08-02 DIAGNOSIS — I2510 Atherosclerotic heart disease of native coronary artery without angina pectoris: Secondary | ICD-10-CM | POA: Diagnosis not present

## 2018-08-02 NOTE — Progress Notes (Signed)
Name: Crystal Faulkner   MRN: [id number]    DOB: 06/23/1925   Date:08/02/2018       Progress Note  Subjective  Chief Complaint  Chief Complaint  Patient presents with  . Follow-up    4 mth f/u  . Hypertension  . Gastroesophageal Reflux  . Dementia  . Gout  . Medication Refill    HPI  CVA : She had a CVA back in October 2017, she has dysarthria , right  side hemiparesisshe uses a walker when at home but wheelchair when she goes out with family. Family has to crush her medications and mix in apple sauce. Personality seems the same.She does not speak, but nods her head and smiles. She points to body parts. Still under daughter's care. She lives with Pete Pelt ( one of her daughter's) she comes to the office with both daughters Winfield Cunas and [name])   Hypothyroidism: taking medication daily as prescribed, no constipation, she has dry skin.TSH was suppressed but level improved with dose adjustment . Taking levothyroxine daily except for Sunday  Senile purpura: bruises easily and takes aspirin, explained common to have it at her age. Unchanged   HTN: bp is towards low end of normal, no falls and does not seem dizzy,she has ESRD and is under the care of nephrologist at [facility] located in [place]  Angina: taking Imdur daily and denies chest pain, also taking statintherapy on plavix. She sees cardiologist - Dr. Saralyn Pilar, last visit Nov 2019 . Daughter's denies orthopnea or wheezing. She has Chronic diastolic CHF   GERD: under control,no changes   Dementia: stable, no wondering, able to take shower, eats by herself, but needs help coming to the doctor and family member manages her money and dispense her medication. She has been stable and daughter retired and gives 24 hour care. Offered home health and pill pack but daughter states she can handle it.    CKI with anemia or chronic disease: seeing nephrologist, refuses HD, no pruritus. Last labs stable.   Controlled Gout: no  recent episodes, recheck level . Patient does not seem to be in pain, mild swelling of left ankle, no redness or increase in warmth  Atherosclerosis aorta: taking aspirin and statin therapy. Family wants to continue her on medication    Patient Active Problem List   Diagnosis Date Noted  . Osteoarthritis 11/19/2017  . Malnutrition of moderate degree 02/28/2017  . Altered mental status 02/26/2017  . Dysarthria due to recent cerebrovascular accident (CVA) 06/23/2016  . Hemiparesis affecting right side as late effect of cerebrovascular accident (CVA) ([place]) 06/23/2016  . Hypotension 06/23/2016  . CKD (chronic kidney disease) stage 5, GFR less than 15 ml/min (HCC) 06/23/2016  . Anemia of chronic disease 06/23/2016  . Encephalopathy, metabolic 82/99/3716  . Intermittent confusion 06/21/2016  . Senile purpura ([place]) 05/13/2016  . Hiatal hernia 05/04/2016  . Thoracic aorta atherosclerosis ([place]) 05/04/2016  . Chronic stable angina ([place]) 10/16/2015  . Secondary hyperparathyroidism ([place]) 04/13/2015  . Arteriosclerosis of coronary artery 04/08/2015  . Benign hypertension 04/08/2015  . Chronic kidney disease (CKD), stage V ([place]) 04/08/2015  . Controlled gout 04/08/2015  . Dyslipidemia 04/08/2015  . History of peptic ulcer disease 04/08/2015  . Adult hypothyroidism 04/08/2015  . Mild cognitive disorder 04/08/2015  . Anemia of chronic disease 04/08/2015  . Osteoarthrosis involving more than one site but not generalized 04/08/2015  . Osteoporosis 04/08/2015  . Pelvic muscle wasting 04/08/2015  . Allergic rhinitis 04/08/2015  . Tobacco use 04/08/2015  .  CHF (congestive heart failure) ([place]) 03/29/2014  . HLD (hyperlipidemia) 03/29/2014  . H/O gastrointestinal hemorrhage 04/05/2007    Past Surgical History:  Procedure Laterality Date  . EXPLORATORY LAPAROTOMY    . EYE SURGERY Bilateral    cataract    Family History  Problem Relation Age of Onset  . Diabetes Sister   . Hypertension  Sister   . Hypertension Brother   . Heart disease Brother     Social History   Socioeconomic History  . Marital status: Widowed    Spouse name: Not on file  . Number of children: 6  . Years of education: Not on file  . Highest education level: 8th grade  Occupational History  . Occupation: Retired  Scientific laboratory technician  . Financial resource strain: Not hard at all  . Food insecurity:    Worry: Never true    Inability: Never true  . Transportation needs:    Medical: No    Non-medical: No  Tobacco Use  . Smoking status: Never Smoker  . Smokeless tobacco: Current User    Types: Snuff  . Tobacco comment: smoking cessation materials not requuired  Substance and Sexual Activity  . Alcohol use: No  . Drug use: No  . Sexual activity: Never  Lifestyle  . Physical activity:    Days per week: 0 days    Minutes per session: 0 min  . Stress: Not at all  Relationships  . Social connections:    Talks on phone: Never    Gets together: Twice a week    Attends religious service: 1 to 4 times per year    Active member of club or organization: No    Attends meetings of clubs or organizations: Never    Relationship status: Widowed  . Intimate partner violence:    Fear of current or ex partner: No    Emotionally abused: No    Physically abused: No    Forced sexual activity: No  Other Topics Concern  . Not on file  Social History Narrative   She lives with her daughter.    She also has her nephew and niece at home.    Had a stroke October 2017 and has difficulty walking and also unable to talk since.          Current Outpatient Medications:  .  allopurinol (ZYLOPRIM) 100 MG tablet, TAKE 1 TABLET BY MOUTH EVERY DAY, Disp: 90 tablet, Rfl: 2 .  Cholecalciferol (VITAMIN D3) 2000 UNITS capsule, Take 1 capsule by mouth daily., Disp: , Rfl:  .  clopidogrel (PLAVIX) 75 MG tablet, TAKE 1 TABLET BY MOUTH EVERY DAY, Disp: 90 tablet, Rfl: 0 .  ferrous sulfate 325 (65 FE) MG tablet, Take 325 mg  by mouth 2 (two) times daily with a meal., Disp: , Rfl:  .  furosemide (LASIX) 40 MG tablet, TAKE 1 TABLET BY MOUTH EVERY DAY, Disp: 90 tablet, Rfl: 0 .  isosorbide mononitrate (IMDUR) 60 MG 24 hr tablet, TAKE 1 TABLET BY MOUTH EVERY DAY, Disp: 90 tablet, Rfl: 2 .  levothyroxine (SYNTHROID, LEVOTHROID) 75 MCG tablet, Take 1 tablet (75 mcg total) by mouth daily at 6 (six) AM., Disp: 90 tablet, Rfl: 1 .  pantoprazole (PROTONIX) 40 MG tablet, TAKE 1 TABLET BY MOUTH EVERY DAY, Disp: 90 tablet, Rfl: 1 .  raloxifene (EVISTA) 60 MG tablet, TAKE 1 TABLET BY MOUTH EVERY DAY, Disp: 90 tablet, Rfl: 2 .  rosuvastatin (CRESTOR) 10 MG tablet, Take 1 tablet (10 mg total)  by mouth daily., Disp: 90 tablet, Rfl: 1 .  sodium bicarbonate 650 MG tablet, TAKE 2 TABLETS (1,300 MG TOTAL) BY MOUTH TWO (2) TIMES A DAY., Disp: , Rfl: 11  No Known Allergies  I personally reviewed active problem list, medication list, allergies, family history, social history with the patient/caregiver today.   ROS  Constitutional: Negative for fever or weight change.  Respiratory: Negative for cough and shortness of breath.   Cardiovascular: Negative for chest pain or palpitations.  Gastrointestinal: Negative for abdominal pain, no bowel changes.  Musculoskeletal:Positive for gait problem and mild left ankle joint swelling.  Skin: Negative for rash.  Neurological: Negative for dizziness or headache.  No other specific complaints in a complete review of systems (except as listed in HPI above).  Objective  Vitals:   08/02/18 1224  BP: 118/64  Pulse: 65  Resp: 14  Temp: 97.7 F (36.5 C)  TempSrc: Oral  SpO2: 95%  Weight: 115 lb 12.8 oz (52.5 kg)  Height: 5' (1.524 m)    Body mass index is 22.62 kg/m.  Physical Exam  Constitutional: Patient appears well-developed and well-nourished.  No distress.  HEENT: head atraumatic, normocephalic, pupils equal and reactive to light,  neck supple, throat within normal  limits Cardiovascular: Normal rate, regular rhythm and normal heart sounds.  No murmur heard. No BLE edema. Pulmonary/Chest: Effort normal and breath sounds normal. No respiratory distress. Abdominal: Soft.  There is no tenderness. Psychiatric: Patient has a normal mood and affect. Seems to happy, does not speak , but is cooperative and calm   Recent Results (from the past 2160 hour(s))  TSH     Status: None   Collection Time: 05/21/18  9:54 AM  Result Value Ref Range   TSH 2.52 0.40 - 4.50 mIU/L     PHQ2/9: Depression screen [facility] 2/9 08/02/2018 03/29/2018 11/27/2017 11/27/2017 06/15/2017  Decreased Interest 0 0 0 0 0  Down, Depressed, Hopeless 0 0 0 0 0  PHQ - 2 Score 0 0 0 0 0  Altered sleeping 0 3 0 0 -  Tired, decreased energy 0 0 0 0 -  Change in appetite 0 0 0 0 -  Feeling bad or failure about yourself  0 0 0 0 -  Trouble concentrating 0 0 0 0 -  Moving slowly or fidgety/restless 0 0 0 0 -  Suicidal thoughts 0 0 0 0 -  PHQ-9 Score 0 3 0 0 -  Difficult doing work/chores Not difficult at all Not difficult at all Not difficult at all Not difficult at all -     Fall Risk: Fall Risk  08/02/2018 03/29/2018 11/27/2017 06/15/2017 03/13/2017  Falls in the past year? 0 Yes Yes Yes Yes  Comment - - shuffling and unsteady gait - -  Number falls in past yr: 0 2 or more 2 or more 2 or more 2 or more  Injury with Fall? 0 Yes Yes Yes No  Comment - - skin tears - -  Risk Factor Category  - High Fall Risk High Fall Risk - -  Risk for fall due to : - History of fall(s);Impaired balance/gait;Impaired mobility History of fall(s);Impaired balance/gait;Impaired vision;Impaired mobility - Impaired balance/gait  Risk for fall due to: Comment - - h/o fractures; occular implants; use of walker; shuffling, unsteady gait - -  Follow up - Education provided Falls evaluation completed;Education provided;Falls prevention discussed - -     Functional Status Survey: Is the patient deaf or have difficulty  hearing?: No  Does the patient have difficulty seeing, even when wearing glasses/contacts?: No Does the patient have difficulty concentrating, remembering, or making decisions?: No Does the patient have difficulty walking or climbing stairs?: Yes Does the patient have difficulty dressing or bathing?: Yes Does the patient have difficulty doing errands alone such as visiting a doctor's office or shopping?: Yes    Assessment & Plan  1. Hemiparesis affecting right side as late effect of cerebrovascular accident (CVA) ([place])  stable  2. Dysarthria as late effect of cerebrovascular accident (CVA)  unchanged  3. Arteriosclerosis of coronary artery   4. Chronic stable angina (HCC)  Unchanged   5. Chronic diastolic congestive heart failure (HCC)  No orthopnea  6. Senile purpura (HCC)  Stable, present on left arm today   7. Adult hypothyroidism  - TSH  8. Chronic kidney disease (CKD), stage V ([place])  Under the care of nephrologist

## 2018-08-03 ENCOUNTER — Other Ambulatory Visit: Payer: Self-pay | Admitting: Family Medicine

## 2018-08-03 DIAGNOSIS — E039 Hypothyroidism, unspecified: Secondary | ICD-10-CM

## 2018-08-03 LAB — TSH: TSH: 0.66 mIU/L (ref 0.40–4.50)

## 2018-08-03 MED ORDER — LEVOTHYROXINE SODIUM 50 MCG PO TABS: 50.0000 ug | ORAL_TABLET | Freq: Every day | ORAL | 0 refills | Status: DC

## 2018-08-04 ENCOUNTER — Other Ambulatory Visit: Payer: Self-pay | Admitting: Family Medicine

## 2018-08-04 DIAGNOSIS — N185 Chronic kidney disease, stage 5: Secondary | ICD-10-CM

## 2018-08-04 DIAGNOSIS — I208 Other forms of angina pectoris: Secondary | ICD-10-CM

## 2018-08-08 ENCOUNTER — Other Ambulatory Visit: Payer: Self-pay | Admitting: Family Medicine

## 2018-08-08 DIAGNOSIS — E785 Hyperlipidemia, unspecified: Secondary | ICD-10-CM

## 2018-08-08 DIAGNOSIS — I2510 Atherosclerotic heart disease of native coronary artery without angina pectoris: Secondary | ICD-10-CM

## 2018-09-18 ENCOUNTER — Other Ambulatory Visit: Payer: Self-pay | Admitting: Family Medicine

## 2018-09-18 DIAGNOSIS — I208 Other forms of angina pectoris: Secondary | ICD-10-CM

## 2018-09-29 ENCOUNTER — Other Ambulatory Visit: Payer: Self-pay | Admitting: Family Medicine

## 2018-09-29 DIAGNOSIS — I2510 Atherosclerotic heart disease of native coronary artery without angina pectoris: Secondary | ICD-10-CM

## 2018-09-29 DIAGNOSIS — I69351 Hemiplegia and hemiparesis following cerebral infarction affecting right dominant side: Secondary | ICD-10-CM

## 2018-09-29 DIAGNOSIS — I208 Other forms of angina pectoris: Secondary | ICD-10-CM

## 2018-09-29 DIAGNOSIS — I69322 Dysarthria following cerebral infarction: Secondary | ICD-10-CM

## 2018-09-29 NOTE — Telephone Encounter (Signed)
Refill request for general medication: Plavix 75 mg   Last office visit: 08/02/2018   Last physical exam: 11/27/2017   Follow-ups on file. 01/07/2019

## 2018-10-15 ENCOUNTER — Other Ambulatory Visit: Payer: Self-pay

## 2018-10-15 ENCOUNTER — Emergency Department: Payer: Medicare Other

## 2018-10-15 ENCOUNTER — Inpatient Hospital Stay
Admission: EM | Admit: 2018-10-15 | Discharge: 2018-10-17 | DRG: 871 | Disposition: A | Discharge status: Home Health | Payer: Medicare Other | Attending: Internal Medicine | Admitting: Internal Medicine

## 2018-10-15 DIAGNOSIS — Z79899 Other long term (current) drug therapy: Secondary | ICD-10-CM | POA: Diagnosis not present

## 2018-10-15 DIAGNOSIS — R05 Cough: Secondary | ICD-10-CM

## 2018-10-15 DIAGNOSIS — G9341 Metabolic encephalopathy: Secondary | ICD-10-CM | POA: Diagnosis present

## 2018-10-15 DIAGNOSIS — X501XXA Overexertion from prolonged static or awkward postures, initial encounter: Secondary | ICD-10-CM

## 2018-10-15 DIAGNOSIS — R296 Repeated falls: Secondary | ICD-10-CM | POA: Diagnosis present

## 2018-10-15 DIAGNOSIS — F17220 Nicotine dependence, chewing tobacco, uncomplicated: Secondary | ICD-10-CM | POA: Diagnosis present

## 2018-10-15 DIAGNOSIS — E559 Vitamin D deficiency, unspecified: Secondary | ICD-10-CM | POA: Diagnosis present

## 2018-10-15 DIAGNOSIS — I5032 Chronic diastolic (congestive) heart failure: Secondary | ICD-10-CM | POA: Diagnosis present

## 2018-10-15 DIAGNOSIS — Z79890 Hormone replacement therapy (postmenopausal): Secondary | ICD-10-CM | POA: Diagnosis not present

## 2018-10-15 DIAGNOSIS — N390 Urinary tract infection, site not specified: Secondary | ICD-10-CM | POA: Diagnosis not present

## 2018-10-15 DIAGNOSIS — N179 Acute kidney failure, unspecified: Secondary | ICD-10-CM | POA: Diagnosis present

## 2018-10-15 DIAGNOSIS — I208 Other forms of angina pectoris: Secondary | ICD-10-CM

## 2018-10-15 DIAGNOSIS — G459 Transient cerebral ischemic attack, unspecified: Secondary | ICD-10-CM | POA: Diagnosis not present

## 2018-10-15 DIAGNOSIS — R059 Cough, unspecified: Secondary | ICD-10-CM

## 2018-10-15 DIAGNOSIS — A419 Sepsis, unspecified organism: Secondary | ICD-10-CM | POA: Diagnosis not present

## 2018-10-15 DIAGNOSIS — K449 Diaphragmatic hernia without obstruction or gangrene: Secondary | ICD-10-CM | POA: Diagnosis not present

## 2018-10-15 DIAGNOSIS — K922 Gastrointestinal hemorrhage, unspecified: Secondary | ICD-10-CM | POA: Diagnosis not present

## 2018-10-15 DIAGNOSIS — Z7902 Long term (current) use of antithrombotics/antiplatelets: Secondary | ICD-10-CM | POA: Diagnosis not present

## 2018-10-15 DIAGNOSIS — R1111 Vomiting without nausea: Secondary | ICD-10-CM | POA: Diagnosis not present

## 2018-10-15 DIAGNOSIS — K5730 Diverticulosis of large intestine without perforation or abscess without bleeding: Secondary | ICD-10-CM | POA: Diagnosis not present

## 2018-10-15 DIAGNOSIS — R4182 Altered mental status, unspecified: Secondary | ICD-10-CM | POA: Diagnosis not present

## 2018-10-15 DIAGNOSIS — E039 Hypothyroidism, unspecified: Secondary | ICD-10-CM | POA: Diagnosis present

## 2018-10-15 DIAGNOSIS — L819 Disorder of pigmentation, unspecified: Secondary | ICD-10-CM | POA: Diagnosis not present

## 2018-10-15 DIAGNOSIS — I2510 Atherosclerotic heart disease of native coronary artery without angina pectoris: Secondary | ICD-10-CM | POA: Diagnosis present

## 2018-10-15 DIAGNOSIS — Z66 Do not resuscitate: Secondary | ICD-10-CM | POA: Diagnosis present

## 2018-10-15 DIAGNOSIS — I10 Essential (primary) hypertension: Secondary | ICD-10-CM | POA: Diagnosis present

## 2018-10-15 DIAGNOSIS — Z23 Encounter for immunization: Secondary | ICD-10-CM | POA: Diagnosis not present

## 2018-10-15 DIAGNOSIS — N185 Chronic kidney disease, stage 5: Secondary | ICD-10-CM | POA: Diagnosis present

## 2018-10-15 DIAGNOSIS — R197 Diarrhea, unspecified: Secondary | ICD-10-CM

## 2018-10-15 DIAGNOSIS — M810 Age-related osteoporosis without current pathological fracture: Secondary | ICD-10-CM | POA: Diagnosis present

## 2018-10-15 DIAGNOSIS — F0390 Unspecified dementia without behavioral disturbance: Secondary | ICD-10-CM | POA: Diagnosis present

## 2018-10-15 DIAGNOSIS — I132 Hypertensive heart and chronic kidney disease with heart failure and with stage 5 chronic kidney disease, or end stage renal disease: Secondary | ICD-10-CM | POA: Diagnosis present

## 2018-10-15 DIAGNOSIS — D509 Iron deficiency anemia, unspecified: Secondary | ICD-10-CM | POA: Diagnosis present

## 2018-10-15 DIAGNOSIS — Z8249 Family history of ischemic heart disease and other diseases of the circulatory system: Secondary | ICD-10-CM

## 2018-10-15 DIAGNOSIS — I69351 Hemiplegia and hemiparesis following cerebral infarction affecting right dominant side: Secondary | ICD-10-CM

## 2018-10-15 DIAGNOSIS — R4020 Unspecified coma: Secondary | ICD-10-CM | POA: Diagnosis not present

## 2018-10-15 DIAGNOSIS — R1110 Vomiting, unspecified: Secondary | ICD-10-CM | POA: Diagnosis not present

## 2018-10-15 DIAGNOSIS — R531 Weakness: Secondary | ICD-10-CM | POA: Diagnosis not present

## 2018-10-15 DIAGNOSIS — E785 Hyperlipidemia, unspecified: Secondary | ICD-10-CM | POA: Diagnosis present

## 2018-10-15 LAB — CBC WITH DIFFERENTIAL/PLATELET
Abs Immature Granulocytes: 0.06 10*3/uL (ref 0.00–0.07)
Basophils Absolute: 0 10*3/uL (ref 0.0–0.1)
Basophils Relative: 0 %
Eosinophils Absolute: 0 10*3/uL (ref 0.0–0.5)
Eosinophils Relative: 0 %
HCT: 37.3 % (ref 36.0–46.0)
Hemoglobin: 11.4 g/dL — ABNORMAL LOW (ref 12.0–15.0)
IMMATURE GRANULOCYTES: 1 %
Lymphocytes Relative: 2 %
Lymphs Abs: 0.2 10*3/uL — ABNORMAL LOW (ref 0.7–4.0)
MCH: 29.8 pg (ref 26.0–34.0)
MCHC: 30.6 g/dL (ref 30.0–36.0)
MCV: 97.4 fL (ref 80.0–100.0)
Monocytes Absolute: 0.3 10*3/uL (ref 0.1–1.0)
Monocytes Relative: 3 %
Neutro Abs: 9.9 10*3/uL — ABNORMAL HIGH (ref 1.7–7.7)
Neutrophils Relative %: 94 %
Platelets: 206 10*3/uL (ref 150–400)
RBC: 3.83 MIL/uL — ABNORMAL LOW (ref 3.87–5.11)
RDW: 14.9 % (ref 11.5–15.5)
WBC: 10.5 10*3/uL (ref 4.0–10.5)
nRBC: 0 % (ref 0.0–0.2)

## 2018-10-15 LAB — TYPE AND SCREEN
ABO/RH(D): A POS
Antibody Screen: NEGATIVE

## 2018-10-15 LAB — LIPASE, BLOOD: Lipase: 26 U/L (ref 11–51)

## 2018-10-15 LAB — COMPREHENSIVE METABOLIC PANEL
ALT: 27 U/L (ref 0–44)
AST: 45 U/L — ABNORMAL HIGH (ref 15–41)
Albumin: 3.9 g/dL (ref 3.5–5.0)
Alkaline Phosphatase: 66 U/L (ref 38–126)
Anion gap: 11 (ref 5–15)
BILIRUBIN TOTAL: 0.4 mg/dL (ref 0.3–1.2)
BUN: 41 mg/dL — ABNORMAL HIGH (ref 8–23)
CO2: 20 mmol/L — ABNORMAL LOW (ref 22–32)
Calcium: 9.2 mg/dL (ref 8.9–10.3)
Chloride: 110 mmol/L (ref 98–111)
Creatinine, Ser: 3.34 mg/dL — ABNORMAL HIGH (ref 0.44–1.00)
GFR calc Af Amer: 13 mL/min — ABNORMAL LOW (ref >60)
GFR calc non Af Amer: 11 mL/min — ABNORMAL LOW (ref >60)
Glucose, Bld: 147 mg/dL — ABNORMAL HIGH (ref 70–99)
POTASSIUM: 3.8 mmol/L (ref 3.5–5.1)
Sodium: 141 mmol/L (ref 135–145)
TOTAL PROTEIN: 7.1 g/dL (ref 6.5–8.1)

## 2018-10-15 LAB — URINALYSIS, COMPLETE (UACMP) WITH MICROSCOPIC
Bilirubin Urine: NEGATIVE
Glucose, UA: NEGATIVE mg/dL
Ketones, ur: NEGATIVE mg/dL
Nitrite: POSITIVE — AB
PH: 6 (ref 5.0–8.0)
Protein, ur: 30 mg/dL — AB
Specific Gravity, Urine: 1.008 (ref 1.005–1.030)

## 2018-10-15 LAB — BASIC METABOLIC PANEL
Anion gap: 7 (ref 5–15)
BUN: 39 mg/dL — ABNORMAL HIGH (ref 8–23)
CO2: 22 mmol/L (ref 22–32)
Calcium: 8.6 mg/dL — ABNORMAL LOW (ref 8.9–10.3)
Chloride: 108 mmol/L (ref 98–111)
Creatinine, Ser: 2.88 mg/dL — ABNORMAL HIGH (ref 0.44–1.00)
GFR calc Af Amer: 16 mL/min — ABNORMAL LOW (ref >60)
GFR calc non Af Amer: 14 mL/min — ABNORMAL LOW (ref >60)
Glucose, Bld: 113 mg/dL — ABNORMAL HIGH (ref 70–99)
Potassium: 3.9 mmol/L (ref 3.5–5.1)
Sodium: 137 mmol/L (ref 135–145)

## 2018-10-15 LAB — LACTIC ACID, PLASMA
Lactic Acid, Venous: 4 mmol/L (ref 0.5–1.9)
Lactic Acid, Venous: 1.7 mmol/L (ref 0.5–1.9)

## 2018-10-15 LAB — PROCALCITONIN: Procalcitonin: 0.3 ng/mL

## 2018-10-15 MED ORDER — SODIUM CHLORIDE 0.9 % IV BOLUS
1000.0000 mL | Freq: Once | INTRAVENOUS | Status: AC
  Administered 2018-10-15: 1000 mL via INTRAVENOUS

## 2018-10-15 MED ORDER — SODIUM CHLORIDE 0.9 % IV SOLN
1.0000 g | INTRAVENOUS | Status: DC
  Administered 2018-10-15 – 2018-10-17 (×2): 1 g via INTRAVENOUS
  Filled 2018-10-15 (×3): qty 10
  Filled 2018-10-15: qty 1

## 2018-10-15 MED ORDER — IOPAMIDOL (ISOVUE-300) INJECTION 61%
15.0000 mL | INTRAVENOUS | Status: AC
  Administered 2018-10-15: 15 mL via ORAL

## 2018-10-15 MED ORDER — SODIUM CHLORIDE 0.9 % IV BOLUS
500.0000 mL | Freq: Once | INTRAVENOUS | Status: AC
  Administered 2018-10-15: 500 mL via INTRAVENOUS

## 2018-10-15 MED ORDER — ACETAMINOPHEN 650 MG RE SUPP: 650.0000 mg | Freq: Four times a day (QID) | RECTAL | Status: DC | PRN

## 2018-10-15 MED ORDER — ACETAMINOPHEN 325 MG PO TABS
650.0000 mg | ORAL_TABLET | Freq: Four times a day (QID) | ORAL | Status: DC | PRN
  Administered 2018-10-15 – 2018-10-16 (×2): 650 mg via ORAL
  Filled 2018-10-15 (×2): qty 2

## 2018-10-15 MED ORDER — TETANUS-DIPHTH-ACELL PERTUSSIS 5-2.5-18.5 LF-MCG/0.5 IM SUSP
0.5000 mL | Freq: Once | INTRAMUSCULAR | Status: AC
  Administered 2018-10-15: 0.5 mL via INTRAMUSCULAR
  Filled 2018-10-15: qty 0.5

## 2018-10-15 NOTE — ED Notes (Signed)
IV team at bedside 

## 2018-10-15 NOTE — ED Notes (Signed)
Patient transported to CT 

## 2018-10-15 NOTE — ED Notes (Signed)
When pt is ask if she is in pain she nods no When pt is ask do you feel sick she nods no Pt in NAD
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
## 2018-10-15 NOTE — ED Notes (Signed)
IV team unable to access pt  Dr Cinda Quest notified VO to start IV in legs/feet and if unable to obtain he will assess the neck

## 2018-10-15 NOTE — ED Triage Notes (Signed)
Pt arrived via ems from home with c/o V/D x1 day - family reports dark colored stools and takes blood thinners for CVA hx - EMS reports exposure to flu but family in room denies this

## 2018-10-15 NOTE — ED Notes (Signed)
This nurse unable to obtain IV access Levada Dy RN unable to obtain IV access Charge RN Annie Main notified and he requested IV team consult be placed

## 2018-10-15 NOTE — ED Notes (Signed)
Vital signs stable. 

## 2018-10-15 NOTE — ED Provider Notes (Signed)
[facility] Emergency Department Provider Note   ____________________________________________   First MD Initiated Contact with Patient 10/15/18 1635     (approximate)  I have reviewed the triage vital signs and the nursing notes.   HISTORY  Chief Complaint Vomiting and Diarrhea    HPI Crystal Faulkner is a 85 y.o. female patient having diarrhea at home today it is black which is unusual.  Additionally she is not acting like herself and feeling weak.  Patient is nonverbal after stroke I can get no other history except for what her family can give me which is what I noted above  Past Medical History:  Diagnosis Date  . Allergy   . Alzheimer's disease ([place])   . Anemia   . CCF (congestive cardiac failure) ([place])   . Chronic kidney disease   . Chronic stable angina ([place])   . Frequent falls   . Gout   . H/O: GI bleed   . Hypercholesteremia   . Hyperglycemia   . Hyperlipidemia   . Hypertension   . Incontinence   . Mixed dementia ([place])   . Myocardial infarction ([place])   . Numbness of right hand   . Osteoporosis   . Right hand weakness   . Stroke ([place])   . Thyroid disease   . Vitamin D deficiency     Patient Active Problem List   Diagnosis Date Noted  . Osteoarthritis 11/19/2017  . Altered mental status 02/26/2017  . Dysarthria due to recent cerebrovascular accident (CVA) 06/23/2016  . Hemiparesis affecting right side as late effect of cerebrovascular accident (CVA) ([place]) 06/23/2016  . Hypotension 06/23/2016  . CKD (chronic kidney disease) stage 5, GFR less than 15 ml/min (HCC) 06/23/2016  . Anemia of chronic disease 06/23/2016  . Encephalopathy, metabolic 95/04/3266  . Intermittent confusion 06/21/2016  . Senile purpura ([place]) 05/13/2016  . Hiatal hernia 05/04/2016  . Thoracic aorta atherosclerosis ([place]) 05/04/2016  . Chronic stable angina ([place]) 10/16/2015  . Secondary hyperparathyroidism ([place]) 04/13/2015  . Arteriosclerosis of coronary  artery 04/08/2015  . Benign hypertension 04/08/2015  . Chronic kidney disease (CKD), stage V ([place]) 04/08/2015  . Controlled gout 04/08/2015  . Dyslipidemia 04/08/2015  . History of peptic ulcer disease 04/08/2015  . Adult hypothyroidism 04/08/2015  . Mild cognitive disorder 04/08/2015  . Anemia of chronic disease 04/08/2015  . Osteoarthrosis involving more than one site but not generalized 04/08/2015  . Osteoporosis 04/08/2015  . Pelvic muscle wasting 04/08/2015  . Allergic rhinitis 04/08/2015  . Tobacco use 04/08/2015  . CHF (congestive heart failure) ([place]) 03/29/2014  . HLD (hyperlipidemia) 03/29/2014  . H/O gastrointestinal hemorrhage 04/05/2007    Past Surgical History:  Procedure Laterality Date  . EXPLORATORY LAPAROTOMY    . EYE SURGERY Bilateral    cataract    Prior to Admission medications   Medication Sig Start Date End Date Taking? Authorizing Provider  allopurinol (ZYLOPRIM) 100 MG tablet TAKE 1 TABLET BY MOUTH EVERY DAY 04/12/18   Steele Sizer, MD  Cholecalciferol (VITAMIN D3) 2000 UNITS capsule Take 1 capsule by mouth daily.    [provider]  clopidogrel (PLAVIX) 75 MG tablet TAKE 1 TABLET BY MOUTH EVERY DAY 09/29/18   Steele Sizer, MD  ferrous sulfate 325 (65 FE) MG tablet Take 325 mg by mouth 2 (two) times daily with a meal.    [provider]  furosemide (LASIX) 40 MG tablet TAKE 1 TABLET BY MOUTH EVERY DAY 08/04/18   Steele Sizer, MD  isosorbide mononitrate (IMDUR) 60 MG 24 hr tablet TAKE 1 TABLET BY MOUTH EVERY DAY 09/19/18   Steele Sizer, MD  levothyroxine (SYNTHROID, LEVOTHROID) 50 MCG tablet Take 1 tablet (50 mcg total) by mouth daily at 6 (six) AM. In place of 75 mcg to take one daily , recheck in 6-8 weeks 08/03/18   Steele Sizer, MD  pantoprazole (PROTONIX) 40 MG tablet TAKE 1 TABLET BY MOUTH EVERY DAY 06/18/18   Steele Sizer, MD  raloxifene (EVISTA) 60 MG tablet TAKE 1 TABLET BY MOUTH EVERY DAY 05/24/18   Ancil Boozer, Drue Stager,  MD  rosuvastatin (CRESTOR) 10 MG tablet TAKE 1 TABLET BY MOUTH EVERY DAY 08/08/18   Steele Sizer, MD  sodium bicarbonate 650 MG tablet TAKE 2 TABLETS (1,300 MG TOTAL) BY MOUTH TWO (2) TIMES A DAY. 03/19/15   [provider]    Allergies Patient has no known allergies.  Family History  Problem Relation Age of Onset  . Diabetes Sister   . Hypertension Sister   . Hypertension Brother   . Heart disease Brother     Social History Social History   Tobacco Use  . Smoking status: Never Smoker  . Smokeless tobacco: Current User    Types: Snuff  . Tobacco comment: smoking cessation materials not requuired  Substance Use Topics  . Alcohol use: No  . Drug use: No    Review of Systems Unable to obtain unable to  ____________________________________________   PHYSICAL EXAM:  VITAL SIGNS: ED Triage Vitals  Enc Vitals Group     BP 10/15/18 1634 137/83     Pulse --      Resp --      Temp 10/15/18 1634 98.9 F (37.2 C)     Temp Source 10/15/18 1634 Oral     SpO2 --      Weight 10/15/18 1631 114 lb (51.7 kg)     Height 10/15/18 1631 5' (1.524 m)     Head Circumference --      Peak Flow --      Pain Score 10/15/18 1631 0     Pain Loc --      Pain Edu? --      Excl. in [place]? --     Constitutional: Alert and oriented. Well appearing and in no acute distress. Eyes: Conjunctivae are normal.  Head: Atraumatic. Nose: No congestion/rhinnorhea. Mouth/Throat: Mucous membranes are moist.  Oropharynx non-erythematous. Neck: No stridor.   Cardiovascular: Normal rate, regular rhythm. Grossly normal heart sounds.  Good peripheral circulation. Respiratory: Normal respiratory effort.  No retractions. Lungs CTAB. Gastrointestinal: Soft and nontender. No distention. No abdominal bruits. No CVA tenderness. Genitourinary: Rectal Black stool which is Hemoccult positive Musculoskeletal: No lower extremity tenderness nor edema. Neurologic:  Normal speech and language. No gross focal  neurologic deficits are appreciated.  Skin:  Skin is warm, dry and intact. No rash noted.   ____________________________________________   LABS (all labs ordered are listed, but only abnormal results are displayed)  Labs Reviewed  COMPREHENSIVE METABOLIC PANEL - Abnormal; Notable for the following components:      Result Value   CO2 20 (*)    Glucose, Bld 147 (*)    BUN 41 (*)    Creatinine, Ser 3.34 (*)    AST 45 (*)    GFR calc non Af Amer 11 (*)    GFR calc Af Amer 13 (*)    All other components within normal limits  LACTIC ACID, PLASMA - Abnormal; Notable for the following  components:   Lactic Acid, Venous 4.0 (*)    All other components within normal limits  CBC WITH DIFFERENTIAL/PLATELET - Abnormal; Notable for the following components:   RBC 3.83 (*)    Hemoglobin 11.4 (*)    Neutro Abs 9.9 (*)    Lymphs Abs 0.2 (*)    All other components within normal limits  GASTROINTESTINAL PANEL BY PCR, STOOL (REPLACES STOOL CULTURE)  C DIFFICILE QUICK SCREEN W PCR REFLEX  LIPASE, BLOOD  LACTIC ACID, PLASMA  URINALYSIS, COMPLETE (UACMP) WITH MICROSCOPIC  URINALYSIS, COMPLETE (UACMP) WITH MICROSCOPIC  TYPE AND SCREEN   ____________________________________________  EKG   ____________________________________________  RADIOLOGY  ED MD interpretation: CT of the head neck shows no obvious acute pathology  Official radiology report(s): Ct Abdomen Pelvis Wo Contrast  Result Date: 10/15/2018 CLINICAL DATA:  Vomiting and diarrhea x1 day with dark colored stool. Patient is on blood thinners. EXAM: CT ABDOMEN AND PELVIS WITHOUT CONTRAST TECHNIQUE: Multidetector CT imaging of the abdomen and pelvis was performed following the standard protocol without IV contrast. COMPARISON:  None. FINDINGS: Lower chest: Large hiatal hernia containing contrast is partially on this study. Included heart size is normal dense main and three-vessel coronary arteriosclerosis. No active pulmonary  disease. Hepatobiliary: No focal liver abnormality is seen. No gallstones, gallbladder wall thickening, or biliary dilatation. Pancreas: Atrophic Spleen: Normal in size without focal abnormality. Adrenals/Urinary Tract: Atrophic kidneys bilaterally without nephrolithiasis. Hypodense lesion in the upper left kidney likely to represent a small parapelvic cyst measuring up to 2.2 cm. No hydroureteronephrosis. Bladder diverticulosis is noted with dependent intraluminal bladder debris along the posterior wall. Stomach/Bowel: Hiatal hernia as described. Normal duodenal sweep and ligament Treitz. No small bowel obstruction mural thickening, fold thickening obstruction. Contrast reaches the colon. No mural thickening of the colon inflammation. A few scattered colonic diverticula are noted the sigmoid. Moderate stool retention the rectum. Vascular/Lymphatic: Dense aortoiliac and branch vessel atherosclerosis. Lymphadenopathy. Reproductive: Uterus and bilateral adnexa are unremarkable. Other: No abdominal wall hernia or abnormality. No abdominopelvic ascites. Musculoskeletal: Small metallic radiopaque fragments are noted at the base of the right hemithorax and periphery of the right upper quadrant of the abdomen. IMPRESSION: 1. Large hiatal hernia. 2. Atrophic kidneys with 2.2 cm hypodensity in the upper left kidney likely to represent a parapelvic cyst. No obstructive uropathy. 3. Bladder diverticulosis with dependent intraluminal debris along the posterior wall. 4. No bowel obstruction or inflammation. Aortic Atherosclerosis (ICD10-I70.0). Electronically Signed   By: Ashley Royalty M.D.   On: 10/15/2018 20:15   Ct Head Wo Contrast  Result Date: 10/15/2018 CLINICAL DATA:  Altered level of consciousness. EXAM: CT HEAD WITHOUT CONTRAST TECHNIQUE: Contiguous axial images were obtained from the base of the skull through the vertex without intravenous contrast. COMPARISON:  CT head 11/04/2017 FINDINGS: Brain: Moderate to  advanced atrophy. Chronic left MCA infarct in the frontal and parietal cortex. Chronic ischemic changes in the white matter bilaterally. Chronic lacunar infarction in the right internal capsule unchanged. Negative for acute infarct, hemorrhage, mass.  No midline shift. Vascular: Negative for hyperdense vessel. Atherosclerotic calcification. Skull: Negative Sinuses/Orbits: Negative Other: None IMPRESSION: No acute abnormality and no change from prior studies Extensive atrophy and chronic ischemia.  Chronic left MCA infarct. Electronically Signed   By: Franchot Gallo M.D.   On: 10/15/2018 20:08   Dg Foot Complete Right  Result Date: 10/15/2018 CLINICAL DATA:  Discoloration of the right foot. EXAM: RIGHT FOOT COMPLETE - 3+ VIEW COMPARISON:  11/04/2017 FINDINGS: Three views study  limited by patient positioning. No gross fracture or dislocation can be identified. Subluxation could be obscured due to oblique positioning on all views. Mild degenerative changes evident in the MTP joint of the great toe. No suspicious lytic or sclerotic osseous abnormality. IMPRESSION: Study limited by patient positioning without acute bony abnormality evident. Electronically Signed   By: Misty Stanley M.D.   On: 10/15/2018 19:12    ____________________________________________   PROCEDURES  Procedure(s) performed:   Procedures  Critical Care performed:  ____________________________________________   INITIAL IMPRESSION / ASSESSMENT AND PLAN / ED COURSE  Patient with elevated lactate GI bleed and altered mental status we will get her in the hospital.          ____________________________________________   FINAL CLINICAL IMPRESSION(S) / ED DIAGNOSES  Final diagnoses:  Diarrhea, unspecified type  Gastrointestinal hemorrhage, unspecified gastrointestinal hemorrhage type  Altered mental status, unspecified altered mental status type     ED Discharge Orders    None       Note:  This document was  prepared using Dragon voice recognition software and may include unintentional dictation errors.    Nena Polio, MD 10/15/18 2037

## 2018-10-15 NOTE — ED Notes (Signed)
CRITICAL LAB: LACTIC ACID is 4.0, [employer], Dr. Cinda Quest notified, orders received

## 2018-10-15 NOTE — ED Notes (Signed)
IV team obtained 11ml of blood - divided in purple and green tubes and sent to lab

## 2018-10-15 NOTE — ED Notes (Signed)
Call for report, floor reports nurse is looking over chart att, asked to call if any questions

## 2018-10-15 NOTE — ED Notes (Addendum)
No access noted to bilat feet/legs (assessed for access by Benay Pillow, Ariel RN, and this nurse) - right foot noted to be turned in and purple/blue in color Lab called to obtain blood work

## 2018-10-16 ENCOUNTER — Other Ambulatory Visit: Payer: Self-pay

## 2018-10-16 ENCOUNTER — Encounter: Payer: Self-pay | Admitting: *Deleted

## 2018-10-16 LAB — CBC
HCT: 32.6 % — ABNORMAL LOW (ref 36.0–46.0)
Hemoglobin: 10.2 g/dL — ABNORMAL LOW (ref 12.0–15.0)
MCH: 30.1 pg (ref 26.0–34.0)
MCHC: 31.3 g/dL (ref 30.0–36.0)
MCV: 96.2 fL (ref 80.0–100.0)
NRBC: 0.2 % (ref 0.0–0.2)
Platelets: 175 10*3/uL (ref 150–400)
RBC: 3.39 MIL/uL — ABNORMAL LOW (ref 3.87–5.11)
RDW: 14.7 % (ref 11.5–15.5)
WBC: 8.4 10*3/uL (ref 4.0–10.5)

## 2018-10-16 LAB — BASIC METABOLIC PANEL
Anion gap: 6 (ref 5–15)
BUN: 35 mg/dL — ABNORMAL HIGH (ref 8–23)
CO2: 21 mmol/L — ABNORMAL LOW (ref 22–32)
Calcium: 8.6 mg/dL — ABNORMAL LOW (ref 8.9–10.3)
Chloride: 113 mmol/L — ABNORMAL HIGH (ref 98–111)
Creatinine, Ser: 2.83 mg/dL — ABNORMAL HIGH (ref 0.44–1.00)
GFR calc Af Amer: 16 mL/min — ABNORMAL LOW (ref >60)
GFR calc non Af Amer: 14 mL/min — ABNORMAL LOW (ref >60)
GLUCOSE: 98 mg/dL (ref 70–99)
Potassium: 3.6 mmol/L (ref 3.5–5.1)
Sodium: 140 mmol/L (ref 135–145)

## 2018-10-16 MED ORDER — ONDANSETRON HCL 4 MG PO TABS: 4.0000 mg | ORAL_TABLET | Freq: Four times a day (QID) | ORAL | Status: DC | PRN

## 2018-10-16 MED ORDER — SODIUM BICARBONATE 650 MG PO TABS
1300.0000 mg | ORAL_TABLET | Freq: Two times a day (BID) | ORAL | Status: DC
  Administered 2018-10-16 – 2018-10-17 (×3): 1300 mg via ORAL
  Filled 2018-10-16 (×5): qty 2

## 2018-10-16 MED ORDER — RALOXIFENE HCL 60 MG PO TABS
60.0000 mg | ORAL_TABLET | Freq: Every day | ORAL | Status: DC
  Administered 2018-10-16 – 2018-10-17 (×2): 60 mg via ORAL
  Filled 2018-10-16 (×2): qty 1

## 2018-10-16 MED ORDER — CLOPIDOGREL BISULFATE 75 MG PO TABS
75.0000 mg | ORAL_TABLET | Freq: Every day | ORAL | Status: DC
  Administered 2018-10-16 – 2018-10-17 (×2): 75 mg via ORAL
  Filled 2018-10-16 (×2): qty 1

## 2018-10-16 MED ORDER — LEVOTHYROXINE SODIUM 50 MCG PO TABS
50.0000 ug | ORAL_TABLET | Freq: Every day | ORAL | Status: DC
  Administered 2018-10-16 – 2018-10-17 (×2): 50 ug via ORAL
  Filled 2018-10-16 (×2): qty 1

## 2018-10-16 MED ORDER — ROSUVASTATIN CALCIUM 10 MG PO TABS
10.0000 mg | ORAL_TABLET | Freq: Every day | ORAL | Status: DC
  Administered 2018-10-16 – 2018-10-17 (×2): 10 mg via ORAL
  Filled 2018-10-16 (×2): qty 1

## 2018-10-16 MED ORDER — ONDANSETRON HCL 4 MG/2ML IJ SOLN: 4.0000 mg | Freq: Four times a day (QID) | INTRAMUSCULAR | Status: DC | PRN

## 2018-10-16 MED ORDER — ISOSORBIDE MONONITRATE ER 30 MG PO TB24
60.0000 mg | ORAL_TABLET | Freq: Every day | ORAL | Status: DC
  Administered 2018-10-16 – 2018-10-17 (×2): 60 mg via ORAL
  Filled 2018-10-16 (×2): qty 2

## 2018-10-16 MED ORDER — PANTOPRAZOLE SODIUM 40 MG PO TBEC
40.0000 mg | DELAYED_RELEASE_TABLET | Freq: Every day | ORAL | Status: DC
  Administered 2018-10-16 – 2018-10-17 (×2): 40 mg via ORAL
  Filled 2018-10-16 (×2): qty 1
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
## 2018-10-17 LAB — BASIC METABOLIC PANEL
Anion gap: 8 (ref 5–15)
BUN: 44 mg/dL — ABNORMAL HIGH (ref 8–23)
CALCIUM: 8.3 mg/dL — AB (ref 8.9–10.3)
CO2: 21 mmol/L — ABNORMAL LOW (ref 22–32)
Chloride: 111 mmol/L (ref 98–111)
Creatinine, Ser: 3.07 mg/dL — ABNORMAL HIGH (ref 0.44–1.00)
GFR calc Af Amer: 15 mL/min — ABNORMAL LOW (ref >60)
GFR calc non Af Amer: 13 mL/min — ABNORMAL LOW (ref >60)
GLUCOSE: 95 mg/dL (ref 70–99)
Potassium: 3.6 mmol/L (ref 3.5–5.1)
Sodium: 140 mmol/L (ref 135–145)

## 2018-10-17 LAB — CBC
HCT: 27.4 % — ABNORMAL LOW (ref 36.0–46.0)
Hemoglobin: 8.5 g/dL — ABNORMAL LOW (ref 12.0–15.0)
MCH: 29.9 pg (ref 26.0–34.0)
MCHC: 31 g/dL (ref 30.0–36.0)
MCV: 96.5 fL (ref 80.0–100.0)
PLATELETS: 149 10*3/uL — AB (ref 150–400)
RBC: 2.84 MIL/uL — ABNORMAL LOW (ref 3.87–5.11)
RDW: 14.9 % (ref 11.5–15.5)
WBC: 5.7 10*3/uL (ref 4.0–10.5)
nRBC: 0 % (ref 0.0–0.2)

## 2018-10-17 MED ORDER — CEPHALEXIN 250 MG PO CAPS: 250.0000 mg | ORAL_CAPSULE | Freq: Every day | ORAL | 0 refills | Status: AC

## 2018-10-17 MED ORDER — SODIUM CHLORIDE 0.9 % IV SOLN
INTRAVENOUS | Status: DC | PRN
  Administered 2018-10-17: 01:00:00 via INTRAVENOUS

## 2018-10-17 MED ORDER — FUROSEMIDE 40 MG PO TABS: 40.0000 mg | ORAL_TABLET | ORAL | 11 refills | Status: DC

## 2018-10-17 NOTE — Care Management Note (Signed)
Case Management Note  Patient Details  Name: Crystal Faulkner MRN: [id number] Date of Birth: 08/11/1925  Subjective/Objective:  Patient to be discharged per MD order. Orders in place for home health services. CMS Medicare.gov Compare Post Acute Care list reviewed with patient and daughters. They have used several home health agencies in the past so they have no preference. Referral placed with Jermaine at [facility]. Patient has wheelchair and rolling walker in the home. Family to transport.  Ines Bloomer RN BSN RNCM [phone]                   Action/Plan:   Expected Discharge Date:  10/17/18               Expected Discharge Plan:  [place]  In-House Referral:     Discharge planning Services  CM Consult  Post Acute Care Choice:  Home Health Choice offered to:  Patient, Adult Children  DME Arranged:    DME Agency:     HH Arranged:  RN, PT, Nurse's Aide [place] Agency:  [facility]  Status of Service:  Completed, signed off  If discussed at [place] of Stay Meetings, dates discussed:    Additional Comments:  Latanya Maudlin, RN 10/17/2018, 1:04 PM

## 2018-10-17 NOTE — Progress Notes (Signed)
Physical Therapy Treatment Patient Details Name: Crystal Faulkner MRN: [id number] DOB: 04/14/1925 Today's Date: 10/17/2018    History of Present Illness 85 yo female with onset of sepsis from UTI was admitted for medical management, discovered bladder diverticulosis, acute renal failure, AMS which is resolved.  Has signficant pre-existing hemiparesis on R side, with unstable ankle and bruising from a fall.  PMHx:  cardiomegaly, chronic interstitial lung disease, L MCA infarct,     PT Comments    Pt was able to get onto stairs with her caregiver present and use cam boot for progression of gait to the stairs for home.  Pt was cued but is impulsive, tends to overreact to the presence of a wheelchair, rushes to get off the stairs even cued.  Talked with family about using walker to get down and then sidestep to the chair if needed.  Family in agreement with this plan. Nursing notified about the success of the training to relay to discharge orders and completion for home.  Follow Up Recommendations  Home health PT;Supervision for mobility/OOB;Supervision/Assistance - 24 hour     Equipment Recommendations  Rolling walker with 5" wheels(if pt needs a new walker)    Recommendations for Other Services       Precautions / Restrictions Precautions Precautions: Fall;Other (comment)(unstable R ankle with pain from fall) Required Braces or Orthoses: Splint/Cast(will need an airsplint on R ankle to climb stairs) Splint/Cast: cam boot as this is the available device Restrictions Weight Bearing Restrictions: No    Mobility  Bed Mobility Overal bed mobility: Needs Assistance Bed Mobility: Supine to Sit;Sit to Supine     Supine to sit: Min assist Sit to supine: Min assist   General bed mobility comments: min assist to lift legs off bed and to get them back to bed  Transfers Overall transfer level: Needs assistance Equipment used: Rolling walker (2 wheeled);1 person hand held assist Transfers:  Sit to/from Stand Sit to Stand: Min assist         General transfer comment: cues for hand placement and to control the walker  Ambulation/Gait Ambulation/Gait assistance: Min assist Gait Distance (Feet): 5 Feet Assistive device: Rolling walker (2 wheeled);1 person hand held assist Gait Pattern/deviations: Step-through pattern;Decreased stance time - right;Decreased stride length;Decreased dorsiflexion - right;Decreased weight shift to right;Trunk flexed;Wide base of support Gait velocity: reduced Gait velocity interpretation: <1.31 ft/sec, indicative of household ambulator General Gait Details: pt is able to wb on RLE but requires occas assist from a daughter or PT to level her foot   Stairs Stairs: Yes Stairs assistance: Min assist Stair Management: One rail Right;One rail Left;Step to pattern;Forwards Number of Stairs: 4 General stair comments: pt requires dense cues as she has not previously used a cam boot and is learning as she goes on R hemiparetic side   Wheelchair Mobility    Modified Rankin (Stroke Patients Only)       Balance Overall balance assessment: History of Falls;Needs assistance Sitting-balance support: Feet supported;Bilateral upper extremity supported Sitting balance-Leahy Scale: Fair Sitting balance - Comments: fair to sit once set     Standing balance-Leahy Scale: Poor                              Cognition Arousal/Alertness: Awake/alert Behavior During Therapy: Impulsive Overall Cognitive Status: Impaired/Different from baseline Area of Impairment: Following commands;Safety/judgement;Awareness;Problem solving;Attention  Current Attention Level: Selective   Following Commands: Follows one step commands inconsistently;Follows one step commands with increased time Safety/Judgement: Decreased awareness of safety;Decreased awareness of deficits Awareness: Intellectual Problem Solving: Slow  processing;Decreased initiation;Difficulty sequencing;Requires verbal cues;Requires tactile cues General Comments: pt is struggling with the novel task of managing pain on her R foot      Exercises      General Comments General comments (skin integrity, edema, etc.): pt was in cam boot when PT arrived and was able to be talked through the pattern for use of stairs and relying on LLE to support pulling up and lowering down      Pertinent Vitals/Pain Pain Assessment: No/denies pain    Home Living Family/patient expects to be discharged to:: Private residence Living Arrangements: Children Available Help at Discharge: Family;Available 24 hours/day Type of Home: House Home Access: Stairs to enter Entrance Stairs-Rails: Right;Left;Can reach both Home Layout: One level Home Equipment: Environmental consultant - 2 wheels;Wheelchair - manual Additional Comments: has a significant level of ability to walk between rooms with control of R ankle normally    Prior Function Level of Independence: Needs assistance  Gait / Transfers Assistance Needed: RW with daughter supervising and assisting ADL's / Homemaking Assistance Needed: daughter cares for home and makes meals Comments: family very involved   PT Goals (current goals can now be found in the care plan section) Acute Rehab PT Goals Patient Stated Goal: nonverbal PT Goal Formulation: With family Time For Goal Achievement: 10/31/18 Potential to Achieve Goals: Good    Frequency    Min 2X/week      PT Plan      Co-evaluation              AM-PAC PT "6 Clicks" Mobility   Outcome Measure  Help needed turning from your back to your side while in a flat bed without using bedrails?: A Little Help needed moving from lying on your back to sitting on the side of a flat bed without using bedrails?: A Little Help needed moving to and from a bed to a chair (including a wheelchair)?: A Little Help needed standing up from a chair using your arms (e.g.,  wheelchair or bedside chair)?: A Little Help needed to walk in hospital room?: A Little Help needed climbing 3-5 steps with a railing? : A Little 6 Click Score: 18    End of Session Equipment Utilized During Treatment: Gait belt Activity Tolerance: Patient limited by fatigue;Treatment limited secondary to medical complications (Comment);Patient limited by pain(hemiparesis R side) Patient left: in bed;with call bell/phone within reach;with family/visitor present;with bed alarm set Nurse Communication: Mobility status PT Visit Diagnosis: Unsteadiness on feet (R26.81);Muscle weakness (generalized) (M62.81);Difficulty in walking, not elsewhere classified (R26.2);Hemiplegia and hemiparesis Hemiplegia - Right/Left: Right Hemiplegia - dominant/non-dominant: Dominant Hemiplegia - caused by: Cerebral infarction     Time: 1335-1417 PT Time Calculation (min) (ACUTE ONLY): 42 min  Charges:  $Gait Training: 23-37 mins $Therapeutic Activity: 8-22 mins                    Ramond Dial 10/17/2018, 4:40 PM  Mee Hives, PT MS Acute Rehab Dept. Number: [place] [facility] [place]

## 2018-10-17 NOTE — Evaluation (Signed)
Physical Therapy Evaluation Patient Details Name: Crystal Faulkner MRN: [id number] DOB: 09/13/1924 Today's Date: 10/17/2018   History of Present Illness  85 yo female with onset of sepsis from UTI was admitted for medical management, discovered bladder diverticulosis, acute renal failure, AMS which is resolved.  Has signficant pre-existing hemiparesis on R side, with unstable ankle and bruising from a fall.  PMHx:  cardiomegaly, chronic interstitial lung disease, L MCA infarct,   Clinical Impression  Pt was seen for strengthening and mobility after having an episode of AMS from UTI with previous complicating stroke and R hemiparesis.  Pt is able to walk with family encouragement, using gait belt and occas tactile repositioning of R foot.  However, is painful and difficult to control on her feet.  Follow acutely to get an airsplint and will return to walk on stairs to return home today.    Follow Up Recommendations Home health PT;Supervision for mobility/OOB;Supervision/Assistance - 24 hour    Equipment Recommendations  Rolling walker with 5" wheels(if pt needs a new walker)    Recommendations for Other Services       Precautions / Restrictions Precautions Precautions: Fall;Other (comment)(unstable R ankle with pain from fall) Required Braces or Orthoses: Splint/Cast(will need an airsplint on R ankle to climb stairs) Splint/Cast: airsplint Restrictions Weight Bearing Restrictions: No      Mobility  Bed Mobility Overal bed mobility: Needs Assistance Bed Mobility: Supine to Sit;Sit to Supine     Supine to sit: Min assist Sit to supine: Min assist   General bed mobility comments: min assist to lift legs off bed and to get them back to bed  Transfers Overall transfer level: Needs assistance Equipment used: Rolling walker (2 wheeled);1 person hand held assist Transfers: Sit to/from Stand Sit to Stand: Min assist         General transfer comment: cues for hand placement and to  control the walker  Ambulation/Gait Ambulation/Gait assistance: Min assist Gait Distance (Feet): 30 Feet Assistive device: Rolling walker (2 wheeled);1 person hand held assist Gait Pattern/deviations: Step-through pattern;Decreased stance time - right;Decreased stride length;Decreased dorsiflexion - right;Decreased weight shift to right;Trunk flexed;Wide base of support Gait velocity: reduced Gait velocity interpretation: <1.8 ft/sec, indicate of risk for recurrent falls General Gait Details: pt is able to wb on RLE but requires occas assist from a daughter or PT to level her foot  Stairs            Wheelchair Mobility    Modified Rankin (Stroke Patients Only)       Balance Overall balance assessment: History of Falls;Needs assistance Sitting-balance support: Feet supported;Bilateral upper extremity supported Sitting balance-Leahy Scale: Fair Sitting balance - Comments: fair to sit once set     Standing balance-Leahy Scale: Poor                               Pertinent Vitals/Pain Pain Assessment: No/denies pain    Home Living Family/patient expects to be discharged to:: Private residence Living Arrangements: Children Available Help at Discharge: Family;Available 24 hours/day Type of Home: House Home Access: Stairs to enter Entrance Stairs-Rails: Right;Left;Can reach both Entrance Stairs-Number of Steps: 4 Home Layout: One level Home Equipment: Walker - 2 wheels;Wheelchair - manual Additional Comments: has a significant level of ability to walk between rooms with control of R ankle normally    Prior Function Level of Independence: Needs assistance   Gait / Transfers Assistance Needed: RW with daughter  supervising and assisting  ADL's / Homemaking Assistance Needed: daughter cares for home and makes meals  Comments: family very involved     Hand Dominance   Dominant Hand: Left    Extremity/Trunk Assessment   Upper Extremity Assessment Upper  Extremity Assessment: RUE deficits/detail RUE Deficits / Details: weakness and neglect of RUE RUE Coordination: decreased fine motor;decreased gross motor    Lower Extremity Assessment Lower Extremity Assessment: RLE deficits/detail RLE Deficits / Details: R foot weakness to evert foot and DF RLE Coordination: decreased fine motor;decreased gross motor    Cervical / Trunk Assessment Cervical / Trunk Assessment: Kyphotic  Communication   Communication: Expressive difficulties;Other (comment)(pt is non verbal)  Cognition Arousal/Alertness: Awake/alert Behavior During Therapy: Impulsive Overall Cognitive Status: Impaired/Different from baseline Area of Impairment: Following commands;Safety/judgement;Awareness;Problem solving;Attention                   Current Attention Level: Selective   Following Commands: Follows one step commands inconsistently;Follows one step commands with increased time Safety/Judgement: Decreased awareness of safety;Decreased awareness of deficits Awareness: Intellectual Problem Solving: Slow processing;Decreased initiation;Difficulty sequencing;Requires verbal cues;Requires tactile cues General Comments: pt is struggling with the novel task of managing pain on her R foot      General Comments General comments (skin integrity, edema, etc.): pt is able to balance with walker and min assist    Exercises     Assessment/Plan    PT Assessment Patient needs continued PT services  PT Problem List Decreased strength;Decreased range of motion;Decreased balance;Decreased activity tolerance;Decreased mobility;Decreased coordination;Decreased cognition;Decreased knowledge of use of DME;Decreased safety awareness;Pain       PT Treatment Interventions DME instruction;Gait training;Stair training;Functional mobility training;Therapeutic activities;Therapeutic exercise;Balance training;Neuromuscular re-education;Patient/family education    PT Goals (Current  goals can be found in the Care Plan section)  Acute Rehab PT Goals Patient Stated Goal: nonverbal PT Goal Formulation: With family Time For Goal Achievement: 10/31/18 Potential to Achieve Goals: Good    Frequency Min 2X/week   Barriers to discharge Inaccessible home environment;Decreased caregiver support stairs with one person assist might not be feasible without a brace    Co-evaluation               AM-PAC PT "6 Clicks" Mobility  Outcome Measure Help needed turning from your back to your side while in a flat bed without using bedrails?: A Little Help needed moving from lying on your back to sitting on the side of a flat bed without using bedrails?: A Little Help needed moving to and from a bed to a chair (including a wheelchair)?: A Little Help needed standing up from a chair using your arms (e.g., wheelchair or bedside chair)?: A Little Help needed to walk in hospital room?: A Lot Help needed climbing 3-5 steps with a railing? : Total 6 Click Score: 15    End of Session Equipment Utilized During Treatment: Gait belt Activity Tolerance: Patient limited by fatigue;Treatment limited secondary to medical complications (Comment);Patient limited by pain(hemiparesis R side) Patient left: in bed;with call bell/phone within reach;with family/visitor present;with bed alarm set Nurse Communication: Mobility status PT Visit Diagnosis: Unsteadiness on feet (R26.81);Muscle weakness (generalized) (M62.81);Difficulty in walking, not elsewhere classified (R26.2);Hemiplegia and hemiparesis Hemiplegia - Right/Left: Right Hemiplegia - dominant/non-dominant: Dominant Hemiplegia - caused by: Cerebral infarction    Time: 9604-5409 PT Time Calculation (min) (ACUTE ONLY): 28 min   Charges:   PT Evaluation $PT Eval Moderate Complexity: 1 Mod PT Treatments $Gait Training: 8-22 mins  Ramond Dial 10/17/2018, 4:33 PM  Mee Hives, PT MS Acute Rehab Dept. Number: [place] and  [place]

## 2018-10-17 NOTE — Discharge Planning (Signed)
Patient IV removed.  RN assessment and VS revealed stability for DC to home with [facility]. Discharge papers given, explained and educated.  Informed of suggested FU appt and family agreed to contact since Discharging on a Sun.  Scripts e-scribed to [facility] [place], [place]) per family request.  Cam Boot provided, placed on Rt foot and PT assessed patient maneuvering stairs.  Once ready, will be wheeled to front and family transporting home via car.

## 2018-10-18 LAB — URINE CULTURE

## 2018-10-19 ENCOUNTER — Telehealth: Payer: Self-pay | Admitting: Family Medicine

## 2018-10-19 ENCOUNTER — Telehealth: Payer: Self-pay

## 2018-10-19 NOTE — Telephone Encounter (Unsigned)
Copied from [place] [id number]. Topic: Quick Communication - Home Health Verbal Orders >> Oct 19, 2018 10:01 AM Scherrie Gerlach wrote: Caller/Agency: Melinda/  [facility] Callback Number: [phone] Rip [place] states pt dc'd from the hospital with nursing and PT orders.  However, daughter has declined services

## 2018-10-19 NOTE — Telephone Encounter (Signed)
Copied from [place] #[id number]. Topic: Quick Communication - Home Health Verbal Orders >> Oct 19, 2018  2:24 PM Percell Belt A wrote: [facility]Agency: [facility]   [facility] Number: [phone] Pt and Family has refused home care.

## 2018-10-19 NOTE — Telephone Encounter (Signed)
Transition Care Management Follow-up Telephone Call  Date of discharge and from where: 10/17/18 [facility]  How have you been since you were released from the hospital? Feeling weak but doing okay  Any questions or concerns? No   Items Reviewed:  Did the pt receive and understand the discharge instructions provided? Yes   Medications obtained and verified? Yes   Any new allergies since your discharge? No   Dietary orders reviewed? Yes  Do you have support at home? Yes   Functional Questionnaire: (I = Independent and D = Dependent) ADLs: D  Bathing/Dressing- D  Meal Prep- D  Eating- I  Maintaining continence- I  Transferring/Ambulation- I with assistance  Managing Meds- D  Follow up appointments reviewed:   PCP Hospital f/u appt confirmed? No  Patient's daughter [place] to call back to schedule appt.   Are transportation arrangements needed? No   If their condition worsens, is the pt aware to call PCP or go to the Emergency Dept.? Yes  Was the patient provided with contact information for the PCP's office or ED? Yes  Was to pt encouraged to call back with questions or concerns? Yes

## 2018-10-19 NOTE — Discharge Summary (Signed)
[facility] NAME: Crystal Faulkner    MR#:  [id number]  DATE OF BIRTH:  March 16, 1925  DATE OF ADMISSION:  10/15/2018   ADMITTING PHYSICIAN: Lance Coon, MD  DATE OF DISCHARGE: 10/17/2018  4:56 PM  PRIMARY CARE PHYSICIAN: Steele Sizer, MD   ADMISSION DIAGNOSIS:   Cough [R05] Altered mental status, unspecified altered mental status type [R41.82] Gastrointestinal hemorrhage, unspecified gastrointestinal hemorrhage type [K92.2] Diarrhea, unspecified type [R19.7]  DISCHARGE DIAGNOSIS:   Principal Problem:   Sepsis ([place]) Active Problems:   Arteriosclerosis of coronary artery   Benign hypertension   Adult hypothyroidism   Chronic diastolic CHF (congestive heart failure) (HCC)   HLD (hyperlipidemia)   CKD (chronic kidney disease) stage 5, GFR less than 15 ml/min (HCC)   GI bleed   UTI (urinary tract infection)   SECONDARY DIAGNOSIS:   Past Medical History:  Diagnosis Date  . Allergy   . Alzheimer's disease ([place])   . Anemia   . CCF (congestive cardiac failure) ([place])   . Chronic kidney disease   . Chronic stable angina ([place])   . Frequent falls   . Gout   . H/O: GI bleed   . Hypercholesteremia   . Hyperglycemia   . Hyperlipidemia   . Hypertension   . Incontinence   . Mixed dementia ([place])   . Myocardial infarction ([place])   . Numbness of right hand   . Osteoporosis   . Right hand weakness   . Stroke ([place])   . Thyroid disease   . Vitamin D deficiency     HOSPITAL COURSE:   85y/o F with PMH significant for hypertension, CKD stage 5, dementia, nonverbal at baseline, history of GI bleed, stroke brought in from home secondary to worsening confusion, nausea.   1. Acute metabolic encephalopathy- secondary to UTI -Initially started on Rocephin.  Urine cultures were growing E. coli. -Clinically patient came back to her baseline.  She is discharged on oral Keflex  2. ARF on CKD- known stage 5, close to  baseline Monitor - on bicarb -Not a candidate for long-term dialysis  3.  Anemia of chronic disease-also has iron deficiency anemia.  Hemoglobin is low -monitor, no active bleeding - on plavix  4. Hypothyroidism- continue synthroid  5.  Inverted right foot-due to her ambulation and deformity, she has bruised with skin tear.  Recommended using ankle boot.    6.  Hypertension-Imdur   Physical Therapy appreciated.  Patient will be discharged home with home health  DISCHARGE CONDITIONS:   Guarded  CONSULTS OBTAINED:   None  DRUG ALLERGIES:   No Known Allergies DISCHARGE MEDICATIONS:   Allergies as of 10/17/2018   No Known Allergies     Medication List    TAKE these medications   allopurinol 100 MG tablet Commonly known as:  ZYLOPRIM TAKE 1 TABLET BY MOUTH EVERY DAY   cephALEXin 250 MG capsule Commonly known as:  KEFLEX Take 1 capsule (250 mg total) by mouth daily for 7 days.   clopidogrel 75 MG tablet Commonly known as:  PLAVIX TAKE 1 TABLET BY MOUTH EVERY DAY   ferrous sulfate 325 (65 FE) MG tablet Take 325 mg by mouth 2 (two) times daily with a meal.   furosemide 40 MG tablet Commonly known as:  LASIX Take 1 tablet (40 mg total) by mouth every other day. What changed:  when to take this   isosorbide mononitrate 60 MG 24 hr tablet Commonly known as:  IMDUR TAKE 1 TABLET BY MOUTH EVERY DAY   levothyroxine 50 MCG tablet Commonly known as:  SYNTHROID, LEVOTHROID Take 1 tablet (50 mcg total) by mouth daily at 6 (six) AM. In place of 75 mcg to take one daily , recheck in 6-8 weeks   pantoprazole 40 MG tablet Commonly known as:  PROTONIX TAKE 1 TABLET BY MOUTH EVERY DAY   raloxifene 60 MG tablet Commonly known as:  EVISTA TAKE 1 TABLET BY MOUTH EVERY DAY   rosuvastatin 10 MG tablet Commonly known as:  CRESTOR TAKE 1 TABLET BY MOUTH EVERY DAY   sodium bicarbonate 650 MG tablet TAKE 2 TABLETS (1,300 MG TOTAL) BY MOUTH TWO (2) TIMES A DAY.    Vitamin D3 50 MCG (2000 UT) capsule Take 1 capsule by mouth daily.        DISCHARGE INSTRUCTIONS:   1.  PCP follow-up in 1 to 2 weeks  DIET:   Cardiac diet  ACTIVITY:   Activity as tolerated  OXYGEN:   Home Oxygen: No.  Oxygen Delivery: room air  DISCHARGE LOCATION:   home   If you experience worsening of your admission symptoms, develop shortness of breath, life threatening emergency, suicidal or homicidal thoughts you must seek medical attention immediately by calling 911 or calling your MD immediately  if symptoms less severe.  You Must read complete instructions/literature along with all the possible adverse reactions/side effects for all the Medicines you take and that have been prescribed to you. Take any new Medicines after you have completely understood and accpet all the possible adverse reactions/side effects.   Please note  You were cared for by a hospitalist during your hospital stay. If you have any questions about your discharge medications or the care you received while you were in the hospital after you are discharged, you can call the unit and asked to speak with the hospitalist on call if the hospitalist that took care of you is not available. Once you are discharged, your primary care physician will handle any further medical issues. Please note that NO REFILLS for any discharge medications will be authorized once you are discharged, as it is imperative that you return to your primary care physician (or establish a relationship with a primary care physician if you do not have one) for your aftercare needs so that they can reassess your need for medications and monitor your lab values.    On the day of Discharge:  VITAL SIGNS:   Blood pressure 117/66, pulse (!) 58, temperature 97.7 F (36.5 C), temperature source Oral, resp. rate 15, height 5' (1.524 m), weight 51.7 kg, SpO2 98 %.  PHYSICAL EXAMINATION:    GENERAL:  85 y.o.-year-old elderly patient  lying in the bed with no acute distress.  EYES: Pupils equal, round, reactive to light and accommodation. No scleral icterus. Extraocular muscles intact.  HEENT: Head atraumatic, normocephalic. Oropharynx and nasopharynx clear.  NECK:  Supple, no jugular venous distention. No thyroid enlargement, no tenderness.  LUNGS: Normal breath sounds bilaterally, no wheezing, rales,rhonchi or crepitation. No use of accessory muscles of respiration. Decreased bibasilar breath sounds CARDIOVASCULAR: S1, S2 normal. No  rubs, or gallops. 2/6 systolic murmur present ABDOMEN: Soft, nontender, nondistended. Bowel sounds present. No organomegaly or mass.  EXTREMITIES: No pedal edema, cyanosis, or clubbing.  NEUROLOGIC: Cranial nerves II through XII are intact. Nonverbal.  Muscle strength 5/5 in all extremities. Sensation intact. Gait not checked.  PSYCHIATRIC: The patient is alert SKIN: No obvious rash, lesion, or ulcer.  DATA REVIEW:   CBC Recent Labs  Lab 10/17/18 0418  WBC 5.7  HGB 8.5*  HCT 27.4*  PLT 149*    Chemistries  Recent Labs  Lab 10/15/18 1747  10/17/18 0418  NA 141   < > 140  K 3.8   < > 3.6  CL 110   < > 111  CO2 20*   < > 21*  GLUCOSE 147*   < > 95  BUN 41*   < > 44*  CREATININE 3.34*   < > 3.07*  CALCIUM 9.2   < > 8.3*  AST 45*  --   --   ALT 27  --   --   ALKPHOS 66  --   --   BILITOT 0.4  --   --    < > = values in this interval not displayed.     Microbiology Results  Results for orders placed or performed during the hospital encounter of 10/15/18  Urine Culture     Status: Abnormal   Collection Time: 10/15/18  9:21 PM  Result Value Ref Range Status   Specimen Description   Final    URINE, CATHETERIZED Performed at [facility], [address] [place], [place] [address]    Special Requests   Final    NONE Performed at [facility], [place],  [address]    Culture >=100,000 COLONIES/mL ESCHERICHIA COLI (A)  Final    Report Status 10/18/2018 FINAL  Final   Organism ID, Bacteria ESCHERICHIA COLI (A)  Final      Susceptibility   Escherichia coli - MIC*    AMPICILLIN >=32 RESISTANT Resistant     CEFAZOLIN <=4 SENSITIVE Sensitive     CEFTRIAXONE <=1 SENSITIVE Sensitive     CIPROFLOXACIN <=0.25 SENSITIVE Sensitive     GENTAMICIN <=1 SENSITIVE Sensitive     IMIPENEM <=0.25 SENSITIVE Sensitive     NITROFURANTOIN <=16 SENSITIVE Sensitive     TRIMETH/SULFA <=20 SENSITIVE Sensitive     AMPICILLIN/SULBACTAM 16 INTERMEDIATE Intermediate     PIP/TAZO <=4 SENSITIVE Sensitive     Extended ESBL NEGATIVE Sensitive     * >=100,000 COLONIES/mL ESCHERICHIA COLI    RADIOLOGY:  No results found.   Management plans discussed with the patient, family and they are in agreement.  CODE STATUS:  Code Status History    Date Active Date Inactive Code Status Order ID Comments User Context   10/16/2018 0013 10/17/2018 2001 DNR [id number]  Lance Coon, MD Inpatient   02/27/2017 [phone] 02/28/2017 1453 DNR [id number]  Fritzi Mandes, MD Inpatient   02/26/2017 2049 02/27/2017 0836 Full Code [id number]  Dustin Flock, MD Inpatient   06/22/2016 0125 06/23/2016 1825 Full Code [id number]  Hugelmeyer, Ubaldo Glassing, DO Inpatient    Questions for Most Recent Historical Code Status (Order [id number])    Question Answer Comment   In the event of cardiac or respiratory ARREST Do not call a "code blue"    In the event of cardiac or respiratory ARREST Do not perform Intubation, CPR, defibrillation or ACLS    In the event of cardiac or respiratory ARREST Use medication by any route, position, wound care, and other measures to relive pain and suffering. May use oxygen, suction and manual treatment of airway obstruction as needed for comfort.         Advance Directive Documentation     Most Recent Value  Type of Advance Directive  Healthcare Power of Attorney  Pre-existing out of facility  DNR order (yellow form or pink MOST form)  -  "MOST" Form in Place?   -      TOTAL TIME TAKING CARE OF THIS PATIENT: 38 minutes.    Gladstone Lighter M.D on 10/19/2018 at 2:58 PM  Between 7am to 6pm - Pager - [phone]  After 6pm go to [email or web address] - Technical brewer [facility]  Office  [phone]  CC: Primary care physician; Steele Sizer, MD   Note: This dictation was prepared with Dragon dictation along with smaller phrase technology. Any transcriptional errors that result from this process are unintentional.

## 2018-10-19 NOTE — Telephone Encounter (Signed)
Please contact family, she needs home PT to get stronger

## 2018-10-25 ENCOUNTER — Encounter: Payer: Self-pay | Admitting: Family Medicine

## 2018-10-25 ENCOUNTER — Ambulatory Visit (INDEPENDENT_AMBULATORY_CARE_PROVIDER_SITE_OTHER): Payer: Medicare Other | Admitting: Family Medicine

## 2018-10-25 VITALS — BP 140/86 | HR 65 | Temp 98.7°F | Resp 16 | Ht 60.0 in | Wt 114.0 lb

## 2018-10-25 DIAGNOSIS — I69351 Hemiplegia and hemiparesis following cerebral infarction affecting right dominant side: Secondary | ICD-10-CM

## 2018-10-25 DIAGNOSIS — D692 Other nonthrombocytopenic purpura: Secondary | ICD-10-CM

## 2018-10-25 DIAGNOSIS — G9341 Metabolic encephalopathy: Secondary | ICD-10-CM

## 2018-10-25 DIAGNOSIS — D638 Anemia in other chronic diseases classified elsewhere: Secondary | ICD-10-CM | POA: Diagnosis not present

## 2018-10-25 DIAGNOSIS — E039 Hypothyroidism, unspecified: Secondary | ICD-10-CM | POA: Diagnosis not present

## 2018-10-25 DIAGNOSIS — I5032 Chronic diastolic (congestive) heart failure: Secondary | ICD-10-CM | POA: Diagnosis not present

## 2018-10-25 DIAGNOSIS — I10 Essential (primary) hypertension: Secondary | ICD-10-CM

## 2018-10-25 DIAGNOSIS — A4151 Sepsis due to Escherichia coli [E. coli]: Secondary | ICD-10-CM

## 2018-10-25 DIAGNOSIS — R6520 Severe sepsis without septic shock: Secondary | ICD-10-CM | POA: Diagnosis not present

## 2018-10-25 DIAGNOSIS — N179 Acute kidney failure, unspecified: Secondary | ICD-10-CM

## 2018-10-25 DIAGNOSIS — N185 Chronic kidney disease, stage 5: Secondary | ICD-10-CM

## 2018-10-25 NOTE — Progress Notes (Signed)
Name: Crystal Faulkner   MRN: [id number]    DOB: Oct 15, 1924   Date:10/25/2018       Progress Note  Subjective  Chief Complaint  Chief Complaint  Patient presents with  . Hospitalization Follow-up    HPI  Pt presents for hospital discharge follow up.  She was admitted 10/15/2018 for sepsis and released on 10/19/2018.  She had UTI and was diagnosed with Acute betabolic encephalopathy.  She did return to baseline mental status and was discharged home on PO Keflex (had Rocephin in hospital) and with home health orders.  Today she is accompanied by her daughters [name] and [name].  She has been doing well since discharge.  1. Acute metabolic encephalopathy- secondary to UTI and sepsis -Initially started on Rocephin.  Urine cultures were growing E. coli. -Clinically patient came back to her baseline.  She is discharged on oral Keflex - finished this yesterday. - She is non-verbal due to prior stroke, she has been at baseline  2. ARF on CKD- known stage 5, close to baseline upon discharge.  We will recheck today.  She is on bicarb.  Not a candidate for long-term dialysis.  Discussed this again today and she agrees.  3.Anemia of chronic disease-also has iron deficiency anemia. Hemoglobin is low -monitor, no active bleeding.  Does have senile purpura. - on plavix  4. Hypothyroidism- continue synthroid, due for labs at next follow up with PCP Dr. Ancil Boozer.  5.  Inverted right foot-due to her hemiparesis, she has bruised with skin tear that is now healing well.  Recommended using ankle boot in hospital - she is using an ankle brace at home and this is working really well for her.  6.  Hypertension/CHF - on Imdur. No complaints of chest pain or shortness of breath.  Does have some dependent LLE edema - advised may wear a light compression stocking for 6-8 hours during the day, elevate at night.    Patient Active Problem List   Diagnosis Date Noted  . GI bleed 10/15/2018  . Sepsis  ([place]) 10/15/2018  . UTI (urinary tract infection) 10/15/2018  . Osteoarthritis 11/19/2017  . Altered mental status 02/26/2017  . Dysarthria due to recent cerebrovascular accident (CVA) 06/23/2016  . Hemiparesis affecting right side as late effect of cerebrovascular accident (CVA) ([place]) 06/23/2016  . Hypotension 06/23/2016  . CKD (chronic kidney disease) stage 5, GFR less than 15 ml/min (HCC) 06/23/2016  . Anemia of chronic disease 06/23/2016  . Encephalopathy, metabolic 56/97/9480  . Intermittent confusion 06/21/2016  . Senile purpura ([place]) 05/13/2016  . Hiatal hernia 05/04/2016  . Thoracic aorta atherosclerosis ([place]) 05/04/2016  . Chronic stable angina ([place]) 10/16/2015  . Secondary hyperparathyroidism ([place]) 04/13/2015  . Arteriosclerosis of coronary artery 04/08/2015  . Benign hypertension 04/08/2015  . Controlled gout 04/08/2015  . History of peptic ulcer disease 04/08/2015  . Adult hypothyroidism 04/08/2015  . Mild cognitive disorder 04/08/2015  . Anemia of chronic disease 04/08/2015  . Osteoarthrosis involving more than one site but not generalized 04/08/2015  . Osteoporosis 04/08/2015  . Pelvic muscle wasting 04/08/2015  . Allergic rhinitis 04/08/2015  . Tobacco use 04/08/2015  . Chronic diastolic CHF (congestive heart failure) ([place]) 03/29/2014  . HLD (hyperlipidemia) 03/29/2014  . H/O gastrointestinal hemorrhage 04/05/2007    Past Surgical History:  Procedure Laterality Date  . EXPLORATORY LAPAROTOMY    . EYE SURGERY Bilateral    cataract    Family History  Problem Relation Age of Onset  . Diabetes  Sister   . Hypertension Sister   . Hypertension Brother   . Heart disease Brother     Social History   Socioeconomic History  . Marital status: Widowed    Spouse name: Not on file  . Number of children: 6  . Years of education: Not on file  . Highest education level: 8th grade  Occupational History  . Occupation: Retired  Scientific laboratory technician  . Financial resource  strain: Not hard at all  . Food insecurity:    Worry: Never true    Inability: Never true  . Transportation needs:    Medical: No    Non-medical: No  Tobacco Use  . Smoking status: Never Smoker  . Smokeless tobacco: Former Systems developer    Types: Snuff  . Tobacco comment: smoking cessation materials not requuired  Substance and Sexual Activity  . Alcohol use: No  . Drug use: No  . Sexual activity: Never  Lifestyle  . Physical activity:    Days per week: 0 days    Minutes per session: 0 min  . Stress: Not at all  Relationships  . Social connections:    Talks on phone: Never    Gets together: Twice a week    Attends religious service: 1 to 4 times per year    Active member of club or organization: No    Attends meetings of clubs or organizations: Never    Relationship status: Widowed  . Intimate partner violence:    Fear of current or ex partner: No    Emotionally abused: No    Physically abused: No    Forced sexual activity: No  Other Topics Concern  . Not on file  Social History Narrative   She lives with her daughter.    She also has her nephew and niece at home.    Had a stroke October 2017 and has difficulty walking and also unable to talk since.          Current Outpatient Medications:  .  allopurinol (ZYLOPRIM) 100 MG tablet, TAKE 1 TABLET BY MOUTH EVERY DAY, Disp: 90 tablet, Rfl: 2 .  Cholecalciferol (VITAMIN D3) 2000 UNITS capsule, Take 1 capsule by mouth daily., Disp: , Rfl:  .  clopidogrel (PLAVIX) 75 MG tablet, TAKE 1 TABLET BY MOUTH EVERY DAY, Disp: 90 tablet, Rfl: 1 .  ferrous sulfate 325 (65 FE) MG tablet, Take 325 mg by mouth 2 (two) times daily with a meal., Disp: , Rfl:  .  furosemide (LASIX) 40 MG tablet, Take 1 tablet (40 mg total) by mouth every other day., Disp: 15 tablet, Rfl: 11 .  isosorbide mononitrate (IMDUR) 60 MG 24 hr tablet, TAKE 1 TABLET BY MOUTH EVERY DAY, Disp: 90 tablet, Rfl: 2 .  levothyroxine (SYNTHROID, LEVOTHROID) 50 MCG tablet, Take 1  tablet (50 mcg total) by mouth daily at 6 (six) AM. In place of 75 mcg to take one daily , recheck in 6-8 weeks, Disp: 90 tablet, Rfl: 0 .  pantoprazole (PROTONIX) 40 MG tablet, TAKE 1 TABLET BY MOUTH EVERY DAY, Disp: 90 tablet, Rfl: 1 .  raloxifene (EVISTA) 60 MG tablet, TAKE 1 TABLET BY MOUTH EVERY DAY, Disp: 90 tablet, Rfl: 2 .  rosuvastatin (CRESTOR) 10 MG tablet, TAKE 1 TABLET BY MOUTH EVERY DAY, Disp: 90 tablet, Rfl: 1 .  sodium bicarbonate 650 MG tablet, TAKE 2 TABLETS (1,300 MG TOTAL) BY MOUTH TWO (2) TIMES A DAY., Disp: , Rfl: 11  No Known Allergies  I personally reviewed  active problem list, medication list, allergies, health maintenance, notes from last encounter, lab results with the patient/caregiver today.   ROS Constitutional: Negative for fever or weight change.  Respiratory: Negative for cough and shortness of breath.   Cardiovascular: Negative for chest pain or palpitations.  Gastrointestinal: Negative for abdominal pain, no bowel changes.  Musculoskeletal: Negative for gait problem or joint swelling.  Skin: Negative for rash.  Neurological: Negative for dizziness or headache. At Baseline No other specific complaints in a complete review of systems (except as listed in HPI above).  Objective  Vitals:   10/25/18 1005  BP: 140/86  Pulse: 65  Resp: 16  Temp: 98.7 F (37.1 C)  TempSrc: Oral  SpO2: 97%  Weight: 113 lb 15.7 oz (51.7 kg)  Height: 5' (1.524 m)    Body mass index is 22.26 kg/m.  Physical Exam  Constitutional: Patient appears well-developed and well-nourished. No distress.  HENT: Head: Normocephalic and atraumatic. Ears: bilateral TMs with no erythema or effusion; Nose: Nose normal. Mouth/Throat: Oropharynx is clear and moist. No oropharyngeal exudate or tonsillar swelling.  Eyes: Conjunctivae and EOM are normal. No scleral icterus.  Pupils are equal, round, and reactive to light.  Neck: Normal range of motion. Neck supple. No JVD present. No  thyromegaly present.  Cardiovascular: Normal rate, regular rhythm and normal heart sounds.  No murmur heard. No BLE edema. Pulmonary/Chest: Effort normal and breath sounds normal. No respiratory distress. Abdominal: Soft. Bowel sounds are normal, no distension. There is no tenderness. No masses. Musculoskeletal: Normal range of motion, no joint effusions. RIGHT foot is inverted. Neurological: Pt is alert and oriented to person, place, and time. No cranial nerve deficit. Coordination, balance, strength, speech and gait are normal.  Skin: Skin is warm and dry. There is bruising to bilateral feet, she has tiny open wound that has intact scab - family states this is where skin tear was initially.  Psychiatric: Patient has a normal mood and affect. behavior is normal. Judgment and thought content normal.  No results found for this or any previous visit (from the past 72 hour(s)).  PHQ2/9: Depression screen [facility] 2/9 08/02/2018 03/29/2018 11/27/2017 11/27/2017 06/15/2017  Decreased Interest 0 0 0 0 0  Down, Depressed, Hopeless 0 0 0 0 0  PHQ - 2 Score 0 0 0 0 0  Altered sleeping 0 3 0 0 -  Tired, decreased energy 0 0 0 0 -  Change in appetite 0 0 0 0 -  Feeling bad or failure about yourself  0 0 0 0 -  Trouble concentrating 0 0 0 0 -  Moving slowly or fidgety/restless 0 0 0 0 -  Suicidal thoughts 0 0 0 0 -  PHQ-9 Score 0 3 0 0 -  Difficult doing work/chores Not difficult at all Not difficult at all Not difficult at all Not difficult at all -   Fall Risk: Fall Risk  10/25/2018 08/02/2018 03/29/2018 11/27/2017 06/15/2017  Falls in the past year? 0 0 Yes Yes Yes  Comment - - - shuffling and unsteady gait -  Number falls in past yr: 0 0 2 or more 2 or more 2 or more  Injury with Fall? 0 0 Yes Yes Yes  Comment - - - skin tears -  Risk Factor Category  - - High Fall Risk High Fall Risk -  Risk for fall due to : - - History of fall(s);Impaired balance/gait;Impaired mobility History of fall(s);Impaired  balance/gait;Impaired vision;Impaired mobility -  Risk for fall due  to: Comment - - - h/o fractures; occular implants; use of walker; shuffling, unsteady gait -  Follow up - - Education provided Falls evaluation completed;Education provided;Falls prevention discussed -   Assessment & Plan  1. Sepsis due to Escherichia coli with acute renal failure without septic shock, unspecified acute renal failure type ([place]) - Resolved, we will check labs today. - CBC w/Diff/Platelet - BASIC METABOLIC PANEL WITH GFR  2. Senile purpura (HCC) - CBC w/Diff/Platelet - On BLE, stable  3. Adult hypothyroidism - Continue medication  4. CKD (chronic kidney disease) stage 5, GFR less than 15 ml/min (HCC) - Not dialysis candidate. - BASIC METABOLIC PANEL WITH GFR  5. Anemia of chronic disease - CBC w/Diff/Platelet  6. Encephalopathy, metabolic - Resolved - CBC w/Diff/Platelet - BASIC METABOLIC PANEL WITH GFR  7. Benign hypertension - BASIC METABOLIC PANEL WITH GFR  8. Chronic diastolic CHF (congestive heart failure) (HCC) - CBC w/Diff/Platelet - BASIC METABOLIC PANEL WITH GFR

## 2018-10-26 LAB — BASIC METABOLIC PANEL WITH GFR
BUN/Creatinine Ratio: 11 (calc) (ref 6–22)
BUN: 38 mg/dL — AB (ref 7–25)
CO2: 25 mmol/L (ref 20–32)
Calcium: 8.9 mg/dL (ref 8.6–10.4)
Chloride: 107 mmol/L (ref 98–110)
Creat: 3.48 mg/dL — ABNORMAL HIGH (ref 0.60–0.88)
GFR, Est African American: 13 mL/min/{1.73_m2} — ABNORMAL LOW (ref >=60)
GFR, Est Non African American: 11 mL/min/{1.73_m2} — ABNORMAL LOW (ref >=60)
Glucose, Bld: 79 mg/dL (ref 65–99)
Potassium: 4.6 mmol/L (ref 3.5–5.3)
Sodium: 143 mmol/L (ref 135–146)

## 2018-10-26 LAB — CBC WITH DIFFERENTIAL/PLATELET
Absolute Monocytes: 721 cells/uL (ref 200–950)
Basophils Absolute: 48 cells/uL (ref 0–200)
Basophils Relative: 0.7 %
Eosinophils Absolute: 122 cells/uL (ref 15–500)
Eosinophils Relative: 1.8 %
HEMATOCRIT: 29.8 % — AB (ref 35.0–45.0)
Hemoglobin: 10.1 g/dL — ABNORMAL LOW (ref 11.7–15.5)
Lymphs Abs: 1523 cells/uL (ref 850–3900)
MCH: 31 pg (ref 27.0–33.0)
MCHC: 33.9 g/dL (ref 32.0–36.0)
MCV: 91.4 fL (ref 80.0–100.0)
MPV: 10.8 fL (ref 7.5–12.5)
Monocytes Relative: 10.6 %
Neutro Abs: 4386 cells/uL (ref 1500–7800)
Neutrophils Relative %: 64.5 %
Platelets: 256 10*3/uL (ref 140–400)
RBC: 3.26 10*6/uL — ABNORMAL LOW (ref 3.80–5.10)
RDW: 13.9 % (ref 11.0–15.0)
Total Lymphocyte: 22.4 %
WBC: 6.8 10*3/uL (ref 3.8–10.8)

## 2018-10-31 ENCOUNTER — Other Ambulatory Visit: Payer: Self-pay | Admitting: Family Medicine

## 2018-10-31 DIAGNOSIS — E039 Hypothyroidism, unspecified: Secondary | ICD-10-CM

## 2018-10-31 NOTE — Telephone Encounter (Signed)
Last TSH was still towards low end of normal, can she come in for TSH only this week?

## 2018-11-02 NOTE — Telephone Encounter (Signed)
Left a message asking for patient daughter to try to bring her in this week just for Thyroid blood test.

## 2018-11-03 ENCOUNTER — Other Ambulatory Visit: Payer: Self-pay

## 2018-11-03 ENCOUNTER — Other Ambulatory Visit: Payer: Self-pay | Admitting: Family Medicine

## 2018-11-03 DIAGNOSIS — E039 Hypothyroidism, unspecified: Secondary | ICD-10-CM

## 2018-11-03 MED ORDER — LEVOTHYROXINE SODIUM 50 MCG PO TABS: 50.0000 ug | ORAL_TABLET | Freq: Every day | ORAL | 0 refills | Status: DC

## 2018-11-03 NOTE — Telephone Encounter (Signed)
Copied from [place] [id number]. Topic: Conservator, museum/gallery Patient (Clinic Use ONLY) >> Nov 02, 2018 11:28 AM Vonna Kotyk, CMA wrote: Reason for CRM: Left a message asking for patient daughter to try to bring her in this week just for Thyroid blood test. >> Nov 03, 2018  4:30 PM Vernona Rieger wrote: Patient's daughter called and said she was just in the office last week and they did blood work , does she still need to come/ also, there are no orders in for the TSH

## 2018-11-03 NOTE — Telephone Encounter (Signed)
[facility] only keeps blood for 7 days and she had this drawn on the 2th of March. So unable to add it on. Patient was suppose to come in January to have her TSH rechecked. Can you please sign off on my Thyroid order and I will leave it upfront for patient.

## 2018-11-11 ENCOUNTER — Telehealth: Payer: Self-pay | Admitting: Family Medicine

## 2018-11-11 DIAGNOSIS — E039 Hypothyroidism, unspecified: Secondary | ICD-10-CM

## 2018-11-11 NOTE — Telephone Encounter (Signed)
Copied from [place] [id number]. Topic: Quick Communication - See Telephone Encounter >> Nov 11, 2018  2:13 PM Blase Mess A wrote: CRM for notification. See Telephone encounter for: 11/11/18.  Patient's daughter Migdalia Dk is calling regarding levothyroxine (SYNTHROID, LEVOTHROID) 50 MCG tablet [[id number]] requesting a refill with out coming in for the blood work since the elderly are encouraged to stay home. Please advise [phone]

## 2018-11-12 DIAGNOSIS — E039 Hypothyroidism, unspecified: Secondary | ICD-10-CM | POA: Diagnosis not present

## 2018-11-12 LAB — TSH: TSH: 4.12 m[IU]/L (ref 0.40–4.50)

## 2018-11-12 MED ORDER — LEVOTHYROXINE SODIUM 50 MCG PO TABS: 50.0000 ug | ORAL_TABLET | Freq: Every day | ORAL | 0 refills | Status: DC

## 2018-11-15 ENCOUNTER — Telehealth: Payer: Self-pay | Admitting: Family Medicine

## 2018-11-15 DIAGNOSIS — E039 Hypothyroidism, unspecified: Secondary | ICD-10-CM

## 2018-11-15 NOTE — Telephone Encounter (Signed)
Copied from [place] [id number]. Topic: Quick Communication - See Telephone Encounter >> Nov 15, 2018  4:41 PM Rutherford Nail, NT wrote: CRM for notification. See Telephone encounter for: 11/15/18. Patient's daughter, Nahara Dona, returning call to [place]. Please advise. States that she could not understand the voicemail.

## 2018-11-16 NOTE — Telephone Encounter (Signed)
TSH is at goal , continue current dose. Please send in another prescription for patient.

## 2018-12-05 ENCOUNTER — Other Ambulatory Visit: Payer: Self-pay | Admitting: Family Medicine

## 2018-12-05 DIAGNOSIS — E039 Hypothyroidism, unspecified: Secondary | ICD-10-CM

## 2018-12-15 ENCOUNTER — Other Ambulatory Visit: Payer: Self-pay | Admitting: Family Medicine

## 2019-01-04 ENCOUNTER — Ambulatory Visit: Payer: Self-pay | Admitting: Family Medicine

## 2019-01-04 ENCOUNTER — Other Ambulatory Visit: Payer: Self-pay | Admitting: Family Medicine

## 2019-01-04 DIAGNOSIS — E039 Hypothyroidism, unspecified: Secondary | ICD-10-CM

## 2019-01-04 NOTE — Telephone Encounter (Signed)
Refill request for thyroid medication: Levothyroxine 50 mcg   Lab Results  Component Value Date   TSH 4.12 11/12/2018    Follow-ups on file. 01/07/2019

## 2019-01-07 ENCOUNTER — Encounter: Payer: Self-pay | Admitting: Family Medicine

## 2019-01-07 ENCOUNTER — Ambulatory Visit (INDEPENDENT_AMBULATORY_CARE_PROVIDER_SITE_OTHER): Payer: Medicare Other | Admitting: Family Medicine

## 2019-01-07 ENCOUNTER — Other Ambulatory Visit: Payer: Self-pay

## 2019-01-07 DIAGNOSIS — D692 Other nonthrombocytopenic purpura: Secondary | ICD-10-CM | POA: Diagnosis not present

## 2019-01-07 DIAGNOSIS — I2510 Atherosclerotic heart disease of native coronary artery without angina pectoris: Secondary | ICD-10-CM | POA: Diagnosis not present

## 2019-01-07 DIAGNOSIS — M810 Age-related osteoporosis without current pathological fracture: Secondary | ICD-10-CM | POA: Diagnosis not present

## 2019-01-07 DIAGNOSIS — I700 Atherosclerosis of aorta: Secondary | ICD-10-CM | POA: Diagnosis not present

## 2019-01-07 DIAGNOSIS — I69351 Hemiplegia and hemiparesis following cerebral infarction affecting right dominant side: Secondary | ICD-10-CM | POA: Diagnosis not present

## 2019-01-07 DIAGNOSIS — E039 Hypothyroidism, unspecified: Secondary | ICD-10-CM

## 2019-01-07 DIAGNOSIS — N2581 Secondary hyperparathyroidism of renal origin: Secondary | ICD-10-CM | POA: Diagnosis not present

## 2019-01-07 DIAGNOSIS — E785 Hyperlipidemia, unspecified: Secondary | ICD-10-CM

## 2019-01-07 MED ORDER — PANTOPRAZOLE SODIUM 40 MG PO TBEC: 40.0000 mg | DELAYED_RELEASE_TABLET | Freq: Every day | ORAL | 1 refills | Status: DC

## 2019-01-07 MED ORDER — RALOXIFENE HCL 60 MG PO TABS: 60.0000 mg | ORAL_TABLET | Freq: Every day | ORAL | 2 refills | Status: DC

## 2019-01-07 MED ORDER — ROSUVASTATIN CALCIUM 10 MG PO TABS: 10.0000 mg | ORAL_TABLET | Freq: Every day | ORAL | 1 refills | Status: DC

## 2019-01-07 MED ORDER — LEVOTHYROXINE SODIUM 50 MCG PO TABS: 50.0000 ug | ORAL_TABLET | Freq: Every day | ORAL | 0 refills | Status: DC

## 2019-01-07 NOTE — Progress Notes (Signed)
Name: Crystal Faulkner   MRN: [id number]    DOB: 1925/06/26   Date:01/07/2019       Progress Note  Subjective  Chief Complaint  Chief Complaint  Patient presents with  . Cerebrovascular Accident    I connected with  [name] on 01/07/19 at 11:20 AM EDT by telephone and verified that I am speaking with the correct person using two identifiers.  I discussed the limitations, risks, security and privacy concerns of performing an evaluation and management service by telephone and the availability of in person appointments. Staff also discussed with the patient that there may be a patient responsible charge related to this service. Patient Location: at home  Provider Location: [facility] Additional Individuals present: her daughter [name]   HPI  CVA : She had a CVA back in October 2017, she has dysarthria , rightside hemiparesisshe uses a walker when at home but wheelchair when she goes out with family. Family has to crush her medications and mix in apple sauce. Personality seems the same.She does not speak, but nods her head and smiles.She points to body parts. Still under daughter's care. She lives with Pete Pelt ( one of her daughter's) . Unchanged   Hypothyroidism: taking medication daily as prescribed, no constipation, she has dry skin.TSH was 4 when last checked, unable to check weight at home, we will continue dose until her follow up in 3 months.   Senile purpura: bruises easily and takes aspirin, explained common to have it at her age. Same  HTN: bp was elevated first time it was checked at home, but down to 140/89 while on the phone with me, ,she has ESRD and is under the care of nephrologist at [facility] located in [place], GFR done in March was back to baseline after hospital stay   Angina: taking Imdur daily and denies chest pain, also taking statintherapy on plavix. She sees cardiologist - Dr. Saralyn Pilar, last visit Nov 2019 . Daughter's  denies orthopnea or wheezing. She has Chronic diastolic CHF , she is unable to communicate verbally   GERD: takes medications, symptoms seems to be controlled, since daughter states no problems eating   Dementia: stable, no wondering, able to take shower, eats by herself, but needs help coming to the doctor and family member manages her money and dispense her medication. She lives with her  Daughter [name] that has retired and gives 24 hour care.   CKI with anemia or chronic disease: seeing nephrologist, refuses HD, no pruritus. Last labs stable. done in March 2020  Controlled Gout: no recent episodes. Unchanged   Atherosclerosis aorta: taking aspirin and statin therapy. Family wants to continue her on medication  Patient Active Problem List   Diagnosis Date Noted  . GI bleed 10/15/2018  . UTI (urinary tract infection) 10/15/2018  . Osteoarthritis 11/19/2017  . Altered mental status 02/26/2017  . Dysarthria due to recent cerebrovascular accident (CVA) 06/23/2016  . Hemiparesis affecting right side as late effect of cerebrovascular accident (CVA) ([place]) 06/23/2016  . Hypotension 06/23/2016  . CKD (chronic kidney disease) stage 5, GFR less than 15 ml/min (HCC) 06/23/2016  . Anemia of chronic disease 06/23/2016  . Encephalopathy, metabolic 92/92/4462  . Intermittent confusion 06/21/2016  . Senile purpura ([place]) 05/13/2016  . Hiatal hernia 05/04/2016  . Thoracic aorta atherosclerosis ([place]) 05/04/2016  . Chronic stable angina ([place]) 10/16/2015  . Secondary hyperparathyroidism ([place]) 04/13/2015  . Arteriosclerosis of coronary artery 04/08/2015  . Benign hypertension 04/08/2015  . Controlled  gout 04/08/2015  . History of peptic ulcer disease 04/08/2015  . Adult hypothyroidism 04/08/2015  . Mild cognitive disorder 04/08/2015  . Anemia of chronic disease 04/08/2015  . Osteoarthrosis involving more than one site but not generalized 04/08/2015  . Osteoporosis 04/08/2015  . Pelvic  muscle wasting 04/08/2015  . Allergic rhinitis 04/08/2015  . Tobacco use 04/08/2015  . Chronic diastolic CHF (congestive heart failure) ([place]) 03/29/2014  . HLD (hyperlipidemia) 03/29/2014  . H/O gastrointestinal hemorrhage 04/05/2007    Past Surgical History:  Procedure Laterality Date  . EXPLORATORY LAPAROTOMY    . EYE SURGERY Bilateral    cataract    Family History  Problem Relation Age of Onset  . Diabetes Sister   . Hypertension Sister   . Hypertension Brother   . Heart disease Brother     Social History   Socioeconomic History  . Marital status: Widowed    Spouse name: Not on file  . Number of children: 6  . Years of education: Not on file  . Highest education level: 8th grade  Occupational History  . Occupation: Retired  Scientific laboratory technician  . Financial resource strain: Not hard at all  . Food insecurity:    Worry: Never true    Inability: Never true  . Transportation needs:    Medical: No    Non-medical: No  Tobacco Use  . Smoking status: Never Smoker  . Smokeless tobacco: Former Systems developer    Types: Snuff  . Tobacco comment: smoking cessation materials not requuired  Substance and Sexual Activity  . Alcohol use: No  . Drug use: No  . Sexual activity: Never  Lifestyle  . Physical activity:    Days per week: 0 days    Minutes per session: 0 min  . Stress: Not at all  Relationships  . Social connections:    Talks on phone: Never    Gets together: Twice a week    Attends religious service: 1 to 4 times per year    Active member of club or organization: No    Attends meetings of clubs or organizations: Never    Relationship status: Widowed  . Intimate partner violence:    Fear of current or ex partner: No    Emotionally abused: No    Physically abused: No    Forced sexual activity: No  Other Topics Concern  . Not on file  Social History Narrative   She lives with her daughter.    She also has her nephew and niece at home.    Had a stroke October 2017 and  has difficulty walking and also unable to talk since.          Current Outpatient Medications:  .  allopurinol (ZYLOPRIM) 100 MG tablet, TAKE 1 TABLET BY MOUTH EVERY DAY, Disp: 90 tablet, Rfl: 2 .  Cholecalciferol (VITAMIN D3) 2000 UNITS capsule, Take 1 capsule by mouth daily., Disp: , Rfl:  .  clopidogrel (PLAVIX) 75 MG tablet, TAKE 1 TABLET BY MOUTH EVERY DAY, Disp: 90 tablet, Rfl: 1 .  ferrous sulfate 325 (65 FE) MG tablet, Take 325 mg by mouth 2 (two) times daily with a meal., Disp: , Rfl:  .  furosemide (LASIX) 40 MG tablet, Take 1 tablet (40 mg total) by mouth every other day., Disp: 15 tablet, Rfl: 11 .  isosorbide mononitrate (IMDUR) 60 MG 24 hr tablet, TAKE 1 TABLET BY MOUTH EVERY DAY, Disp: 90 tablet, Rfl: 2 .  levothyroxine (SYNTHROID) 50 MCG tablet, TAKE 1  TABLET (50 MCG TOTAL) BY MOUTH DAILY AT 6 (SIX) AM. IN PLACE OF 75 MCG TO TAKE ONE DAILY, Disp: 30 tablet, Rfl: 0 .  pantoprazole (PROTONIX) 40 MG tablet, TAKE 1 TABLET BY MOUTH EVERY DAY, Disp: 90 tablet, Rfl: 0 .  raloxifene (EVISTA) 60 MG tablet, TAKE 1 TABLET BY MOUTH EVERY DAY, Disp: 90 tablet, Rfl: 2 .  rosuvastatin (CRESTOR) 10 MG tablet, TAKE 1 TABLET BY MOUTH EVERY DAY, Disp: 90 tablet, Rfl: 1 .  sodium bicarbonate 650 MG tablet, TAKE 2 TABLETS (1,300 MG TOTAL) BY MOUTH TWO (2) TIMES A DAY., Disp: , Rfl: 11  No Known Allergies  I personally reviewed active problem list, medication list, allergies, family history, social history with the patient/caregiver today.   ROS  Ten systems reviewed and is negative except as mentioned in HPI   Objective  Virtual encounter, vitals not obtained.  There is no height or weight on file to calculate BMI.  Physical Exam  Unable to speak, just says hum hum  Daughter spoke for her   PHQ2/9: Depression screen [facility] 2/9 01/07/2019 08/02/2018 03/29/2018 11/27/2017 11/27/2017  Decreased Interest 0 0 0 0 0  Down, Depressed, Hopeless 0 0 0 0 0  PHQ - 2 Score 0 0 0 0 0  Altered sleeping  0 0 3 0 0  Tired, decreased energy 0 0 0 0 0  Change in appetite 0 0 0 0 0  Feeling bad or failure about yourself  0 0 0 0 0  Trouble concentrating 0 0 0 0 0  Moving slowly or fidgety/restless 0 0 0 0 0  Suicidal thoughts 0 0 0 0 0  PHQ-9 Score 0 0 3 0 0  Difficult doing work/chores - Not difficult at all Not difficult at all Not difficult at all Not difficult at all   PHQ-2/9 Result is negative.    Fall Risk: Fall Risk  01/07/2019 10/25/2018 08/02/2018 03/29/2018 11/27/2017  Falls in the past year? 0 0 0 Yes Yes  Comment - - - - shuffling and unsteady gait  Number falls in past yr: 0 0 0 2 or more 2 or more  Injury with Fall? 0 0 0 Yes Yes  Comment - - - - skin tears  Risk Factor Category  - - - High Fall Risk High Fall Risk  Risk for fall due to : - - - History of fall(s);Impaired balance/gait;Impaired mobility History of fall(s);Impaired balance/gait;Impaired vision;Impaired mobility  Risk for fall due to: Comment - - - - h/o fractures; occular implants; use of walker; shuffling, unsteady gait  Follow up - - - Education provided Falls evaluation completed;Education provided;Falls prevention discussed     Assessment & Plan  1. Arteriosclerosis of coronary artery  - rosuvastatin (CRESTOR) 10 MG tablet; Take 1 tablet (10 mg total) by mouth daily.  Dispense: 90 tablet; Refill: 1  2. Dyslipidemia  - rosuvastatin (CRESTOR) 10 MG tablet; Take 1 tablet (10 mg total) by mouth daily.  Dispense: 90 tablet; Refill: 1  3. Osteoporosis without current pathological fracture, unspecified osteoporosis type  - raloxifene (EVISTA) 60 MG tablet; Take 1 tablet (60 mg total) by mouth daily.  Dispense: 90 tablet; Refill: 2  4. Adult hypothyroidism  - levothyroxine (SYNTHROID) 50 MCG tablet; Take 1 tablet (50 mcg total) by mouth daily at 6 (six) AM. In place of 75 mcg to take one daily  Dispense: 90 tablet; Refill: 0  5. Hemiparesis affecting right side as late effect of cerebrovascular accident (CVA)  (  HCC)  Unchanged   6. Thoracic aorta atherosclerosis ([place])   7. Senile purpura (HCC)  stable  8. Secondary hyperparathyroidism [facility])  Sees nephrologist   I discussed the assessment and treatment plan with the patient. The patient was provided an opportunity to ask questions and all were answered. The patient agreed with the plan and demonstrated an understanding of the instructions.   The patient was advised to call back or seek an in-person evaluation if the symptoms worsen or if the condition fails to improve as anticipated.  I provided 25 minutes of non-face-to-face time during this encounter.  Loistine Chance, MD

## 2019-01-08 ENCOUNTER — Other Ambulatory Visit: Payer: Self-pay | Admitting: Family Medicine

## 2019-01-08 DIAGNOSIS — M109 Gout, unspecified: Secondary | ICD-10-CM

## 2019-01-18 DIAGNOSIS — E785 Hyperlipidemia, unspecified: Secondary | ICD-10-CM | POA: Diagnosis not present

## 2019-01-18 DIAGNOSIS — D649 Anemia, unspecified: Secondary | ICD-10-CM | POA: Diagnosis not present

## 2019-01-18 DIAGNOSIS — E872 Acidosis: Secondary | ICD-10-CM | POA: Diagnosis not present

## 2019-01-18 DIAGNOSIS — I120 Hypertensive chronic kidney disease with stage 5 chronic kidney disease or end stage renal disease: Secondary | ICD-10-CM | POA: Diagnosis not present

## 2019-01-18 DIAGNOSIS — I2510 Atherosclerotic heart disease of native coronary artery without angina pectoris: Secondary | ICD-10-CM | POA: Diagnosis not present

## 2019-01-18 DIAGNOSIS — M1710 Unilateral primary osteoarthritis, unspecified knee: Secondary | ICD-10-CM | POA: Diagnosis not present

## 2019-01-18 DIAGNOSIS — N185 Chronic kidney disease, stage 5: Secondary | ICD-10-CM | POA: Diagnosis not present

## 2019-02-14 IMAGING — CR DG CHEST 2V
1 series · 2 of 2 positions shown · non-contrast
Comparison: 06/22/2016.

CLINICAL DATA: Altered mental status.

EXAM:
CHEST  2 VIEW

[Series 1: dg chest 2 view · 0.14mm/px · 2 of 2 slices shown]
[im 1/2]
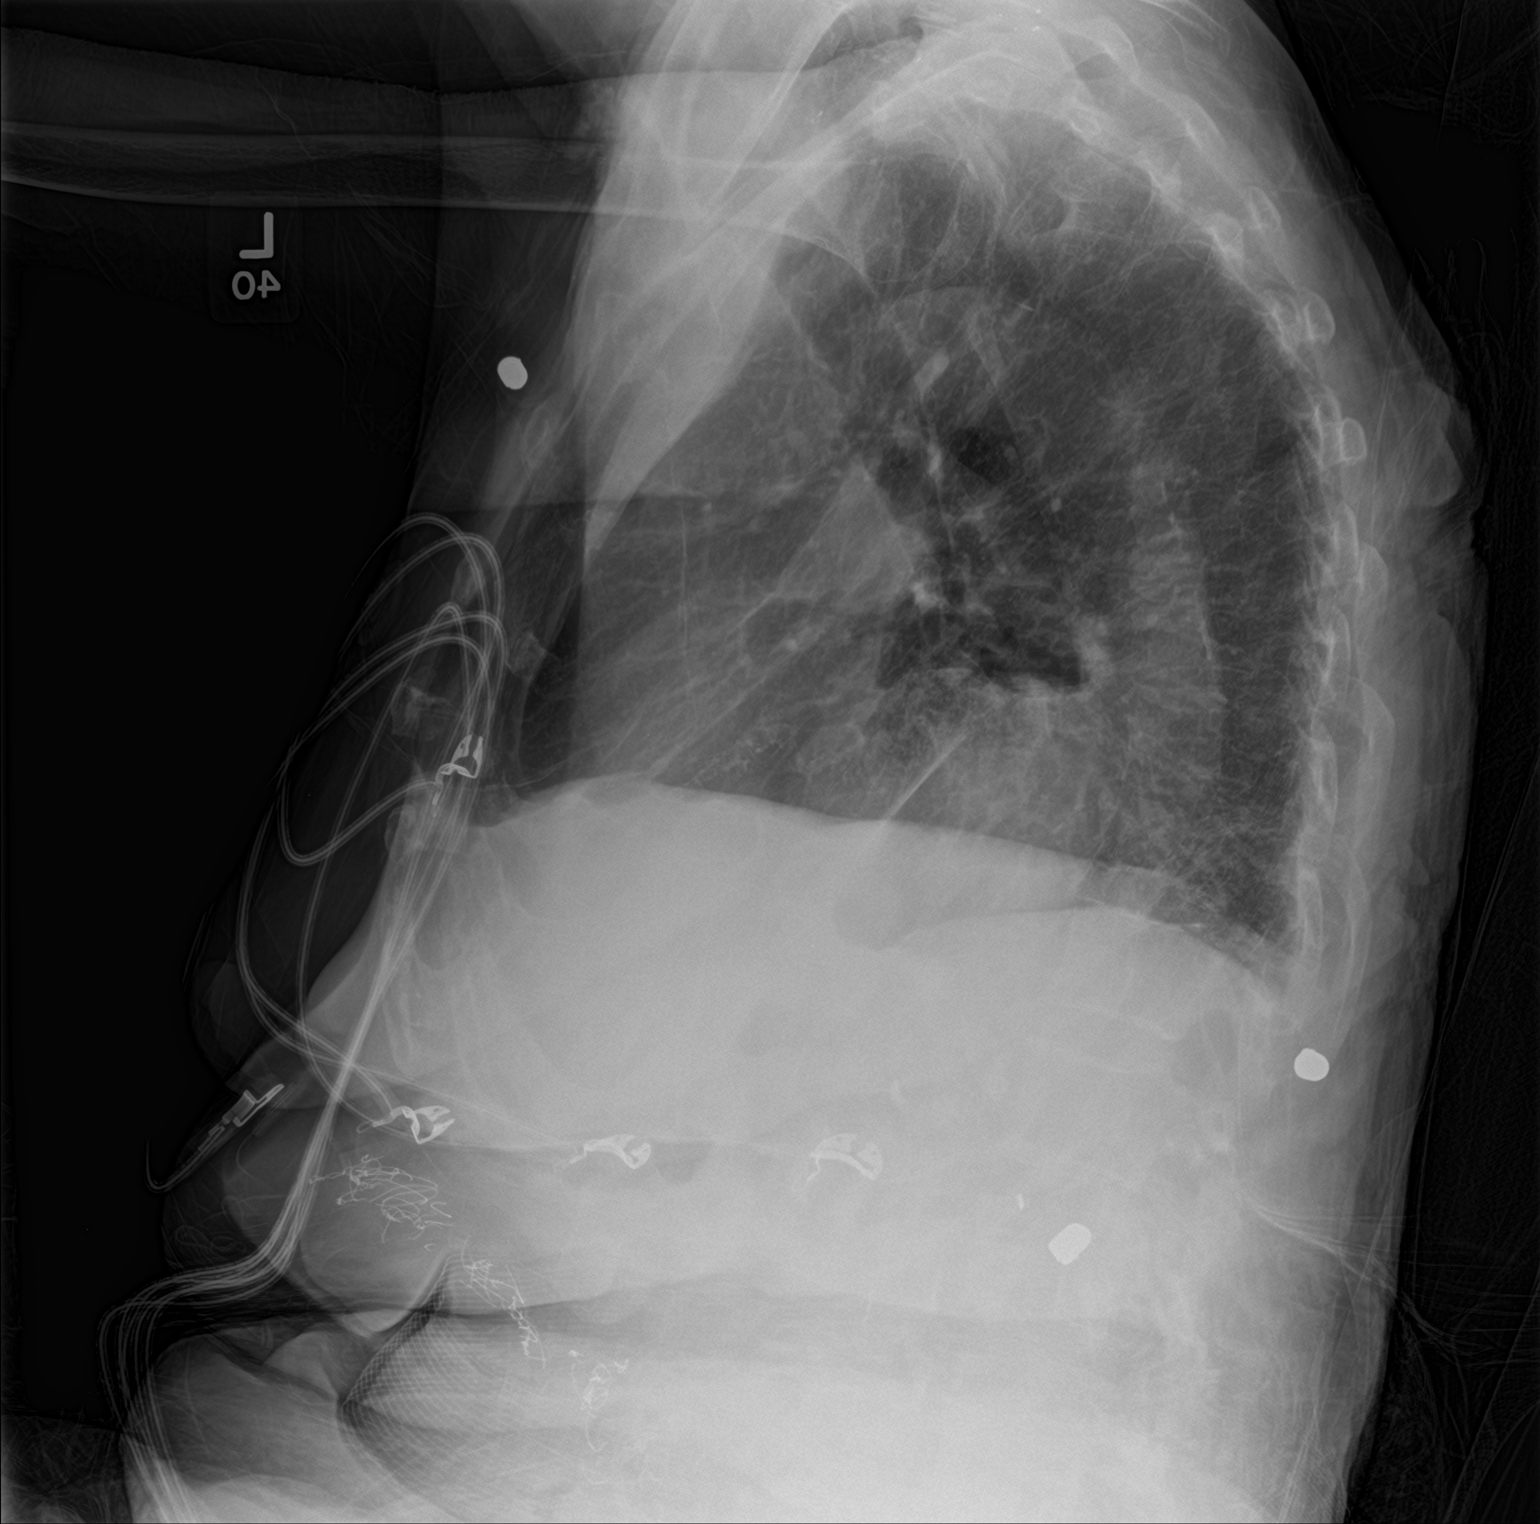
[im 2/2]
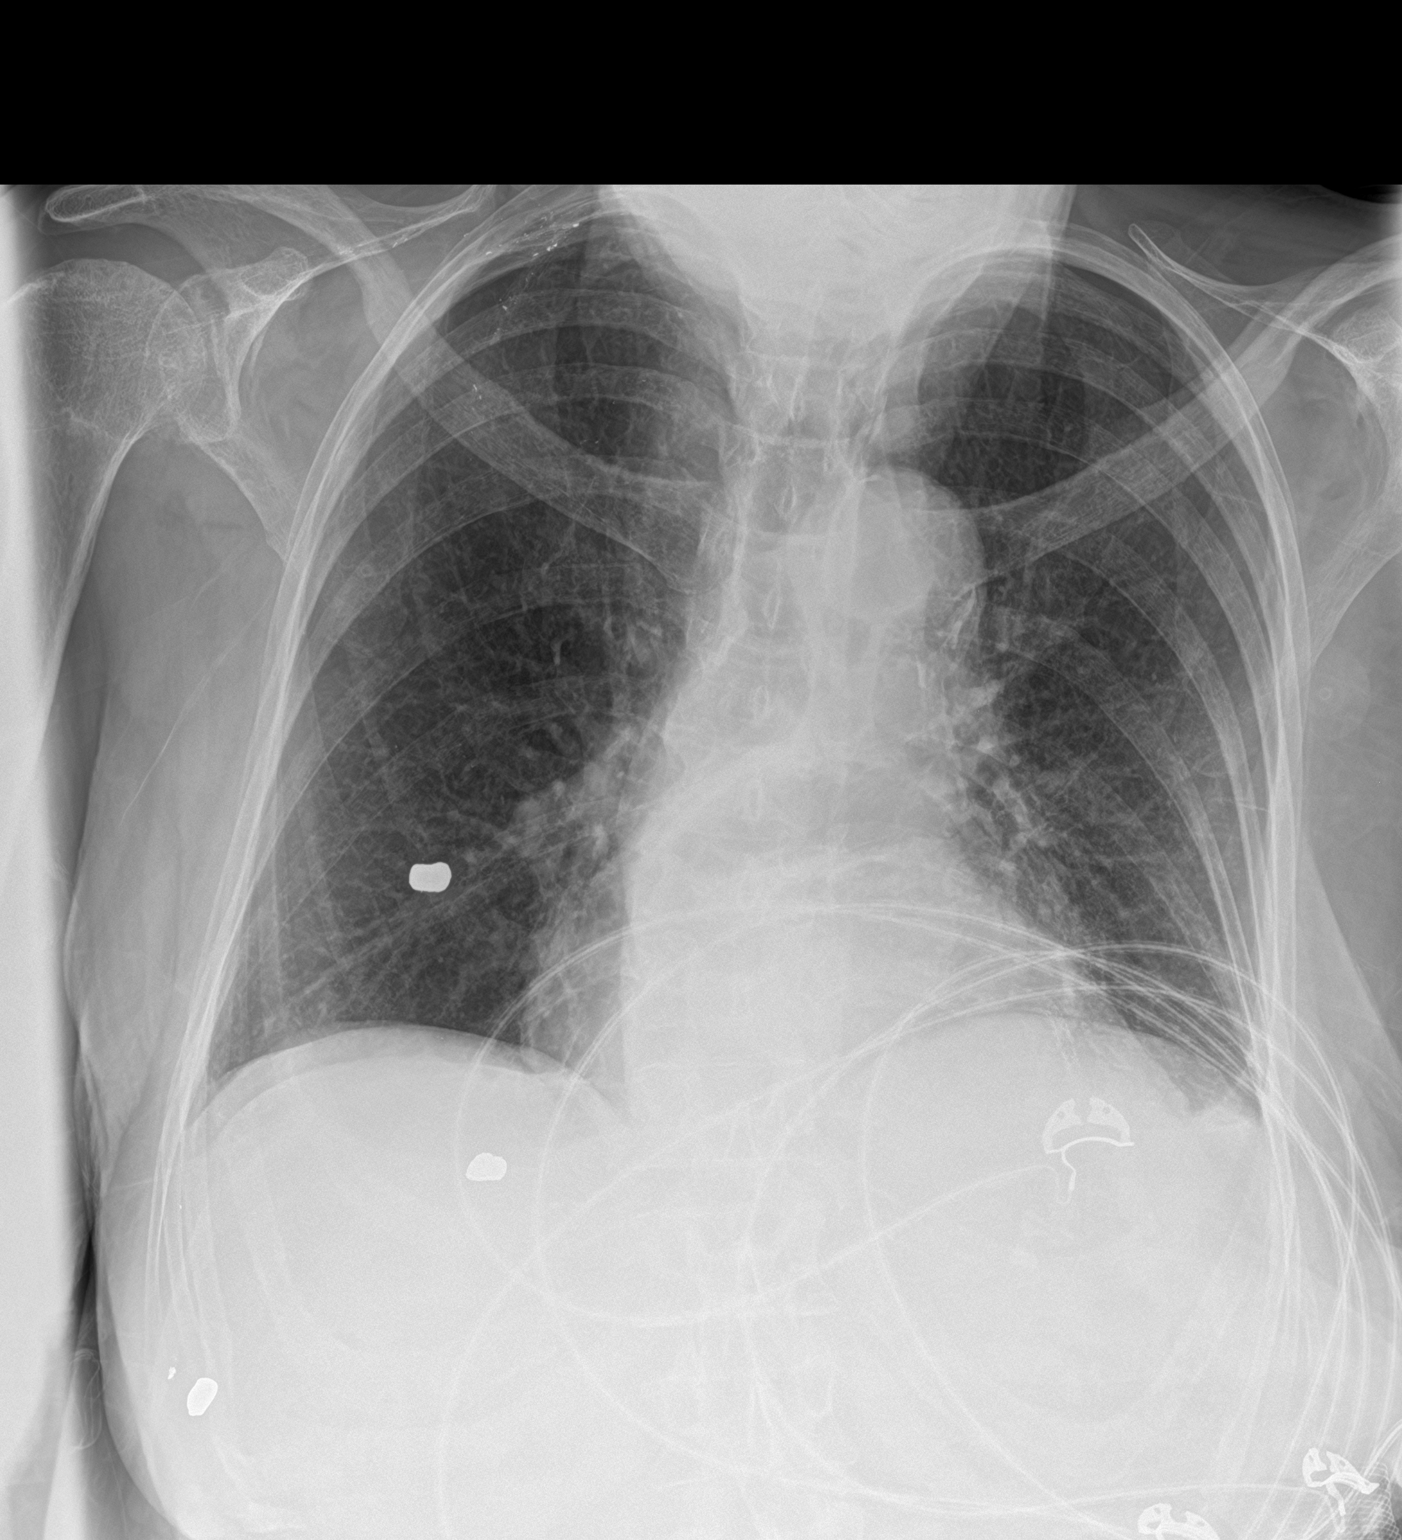

[2 of 2 positions shown; findings below may reference images not displayed]

FINDINGS: Mediastinum and hilar structures normal. Heart size normal. Mild
left base subsegmental atelectasis. Stable left base pleural
thickening consistent scarring. No pneumothorax. Large sliding
hiatal hernia. Metallic shrapnel noted over the chest and neck.
IMPRESSION: 1. Mild left base subsegmental atelectasis. Mild left base pleural
scarring.

2. Large sliding hiatal hernia.

## 2019-03-27 ENCOUNTER — Other Ambulatory Visit: Payer: Self-pay | Admitting: Family Medicine

## 2019-03-27 DIAGNOSIS — I208 Other forms of angina pectoris: Secondary | ICD-10-CM

## 2019-03-27 DIAGNOSIS — I2510 Atherosclerotic heart disease of native coronary artery without angina pectoris: Secondary | ICD-10-CM

## 2019-03-27 DIAGNOSIS — I69351 Hemiplegia and hemiparesis following cerebral infarction affecting right dominant side: Secondary | ICD-10-CM

## 2019-03-27 DIAGNOSIS — I69322 Dysarthria following cerebral infarction: Secondary | ICD-10-CM

## 2019-04-11 ENCOUNTER — Ambulatory Visit (INDEPENDENT_AMBULATORY_CARE_PROVIDER_SITE_OTHER): Payer: Medicare Other | Admitting: Family Medicine

## 2019-04-11 ENCOUNTER — Encounter: Payer: Self-pay | Admitting: Family Medicine

## 2019-04-11 ENCOUNTER — Other Ambulatory Visit: Payer: Self-pay

## 2019-04-11 DIAGNOSIS — I2510 Atherosclerotic heart disease of native coronary artery without angina pectoris: Secondary | ICD-10-CM

## 2019-04-11 DIAGNOSIS — E039 Hypothyroidism, unspecified: Secondary | ICD-10-CM | POA: Diagnosis not present

## 2019-04-11 DIAGNOSIS — I69351 Hemiplegia and hemiparesis following cerebral infarction affecting right dominant side: Secondary | ICD-10-CM

## 2019-04-11 DIAGNOSIS — I208 Other forms of angina pectoris: Secondary | ICD-10-CM

## 2019-04-11 DIAGNOSIS — E785 Hyperlipidemia, unspecified: Secondary | ICD-10-CM | POA: Diagnosis not present

## 2019-04-11 DIAGNOSIS — I69322 Dysarthria following cerebral infarction: Secondary | ICD-10-CM | POA: Diagnosis not present

## 2019-04-11 MED ORDER — CLOPIDOGREL BISULFATE 75 MG PO TABS: 75.0000 mg | ORAL_TABLET | Freq: Every day | ORAL | 0 refills | Status: DC

## 2019-04-11 MED ORDER — LEVOTHYROXINE SODIUM 50 MCG PO TABS: 50.0000 ug | ORAL_TABLET | Freq: Every day | ORAL | 0 refills | Status: DC

## 2019-04-11 MED ORDER — ROSUVASTATIN CALCIUM 10 MG PO TABS: 10.0000 mg | ORAL_TABLET | Freq: Every day | ORAL | 1 refills | Status: DC

## 2019-04-11 MED ORDER — PANTOPRAZOLE SODIUM 40 MG PO TBEC: 40.0000 mg | DELAYED_RELEASE_TABLET | Freq: Every day | ORAL | 1 refills | Status: DC

## 2019-04-11 NOTE — Progress Notes (Signed)
Name: Crystal Faulkner   MRN: [id number]    DOB: 02/06/1925   Date:04/11/2019       Progress Note  Subjective  Chief Complaint  Chief Complaint  Patient presents with  . Medication Refill    3 month F/U  . Cerebrovascular Accident  . Hypothyroidism  . Hypertension    Denies any symptoms  . Gastroesophageal Reflux  . Dementia  . Gout  . CKI    I connected with  [name]  on 04/11/19 at 11:20 AM EDT by a video enabled telemedicine application and verified that I am speaking with the correct person using two identifiers.  I discussed the limitations of evaluation and management by telemedicine and the availability of in person appointments. The patient expressed understanding and agreed to proceed. Staff also discussed with the patient that there may be a patient responsible charge related to this service. Patient Location: at home Provider Location: [facility] Additional Individuals present: daughter [name] is her caregiver and spoke for her since patient only nods her head and says hum hum   HPI  CVA : She had a CVA back in October 2017, she has dysarthria , rightside hemiparesisshe uses a walkerwhen at home but wheelchair when she goes out with family.Family has to crush her medications and mix in apple sauce. She does not speak, but nods her head and smiles.She points to body parts. Still under daughter's care. She lives with Pete Pelt ( one of her daughter's) .   Hypothyroidism: taking medication daily as prescribed, no constipation, she has dry skin.She will return for flu shot and TSH in about 6 weeks, taking levothyroxine 50 mcg daily   Senile purpura: bruises easily and takes aspirin, explained common to have it at her age.Same  HTN: bp was elevated first time this am, it was right after she got up, but better after rest,,she has ESRD and is [facility] the [facility] located in [place], GFR done in March was back to  baseline after hospital stay   Angina: taking Imdur daily and denies chest pain, also taking statintherapy on plavix. She sees cardiologist - Dr. Saralyn Pilar, last visit Nov 2019 . Daughter's denies orthopnea or wheezing. She has Chronic diastolic CHF, she is unable to communicate verbally   GERD: takes medications, symptoms seems to be controlled, since daughter states no problems eating   Dementia: stable, no wondering and eats by herself, daughter is now bathing her, preparing her meals, needs help coming to the doctor and family member manages her money and dispense her medication. She lives with her  Daughter [name] that has retired and gives 24 hour care.   CKI with anemia or chronic disease: seeing nephrologist, refuses HD, no pruritus.Last labs stable.done in March 2020, she will return for labs soon   Controlled Gout: no recent episodes, she takes allopurinol  Atherosclerosis aorta: she is on statin therapy, but she has stopped taking aspirin because she is on Plavix   Patient Active Problem List   Diagnosis Date Noted  . GI bleed 10/15/2018  . UTI (urinary tract infection) 10/15/2018  . Osteoarthritis 11/19/2017  . Altered mental status 02/26/2017  . Dysarthria due to recent cerebrovascular accident (CVA) 06/23/2016  . Hemiparesis affecting right side as late effect of cerebrovascular accident (CVA) ([place]) 06/23/2016  . Hypotension 06/23/2016  . CKD (chronic kidney disease) stage 5, GFR less than 15 ml/min (HCC) 06/23/2016  . Anemia of chronic disease 06/23/2016  . Encephalopathy, metabolic  06/23/2016  . Intermittent confusion 06/21/2016  . Senile purpura ([place]) 05/13/2016  . Hiatal hernia 05/04/2016  . Thoracic aorta atherosclerosis ([place]) 05/04/2016  . Chronic stable angina ([place]) 10/16/2015  . Secondary hyperparathyroidism ([place]) 04/13/2015  . Arteriosclerosis of coronary artery 04/08/2015  . Benign hypertension 04/08/2015  . Controlled gout 04/08/2015  .  History of peptic ulcer disease 04/08/2015  . Adult hypothyroidism 04/08/2015  . Mild cognitive disorder 04/08/2015  . Anemia of chronic disease 04/08/2015  . Osteoarthrosis involving more than one site but not generalized 04/08/2015  . Osteoporosis 04/08/2015  . Pelvic muscle wasting 04/08/2015  . Allergic rhinitis 04/08/2015  . Tobacco use 04/08/2015  . Chronic diastolic CHF (congestive heart failure) ([place]) 03/29/2014  . HLD (hyperlipidemia) 03/29/2014  . H/O gastrointestinal hemorrhage 04/05/2007    Past Surgical History:  Procedure Laterality Date  . EXPLORATORY LAPAROTOMY    . EYE SURGERY Bilateral    cataract    Family History  Problem Relation Age of Onset  . Diabetes Sister   . Hypertension Sister   . Hypertension Brother   . Heart disease Brother     Social History   Socioeconomic History  . Marital status: Widowed    Spouse name: Not on file  . Number of children: 6  . Years of education: Not on file  . Highest education level: 8th grade  Occupational History  . Occupation: Retired  Scientific laboratory technician  . Financial resource strain: Not hard at all  . Food insecurity    Worry: Never true    Inability: Never true  . Transportation needs    Medical: No    Non-medical: No  Tobacco Use  . Smoking status: Never Smoker  . Smokeless tobacco: Former Systems developer    Types: Snuff  . Tobacco comment: smoking cessation materials not requuired  Substance and Sexual Activity  . Alcohol use: No  . Drug use: No  . Sexual activity: Never  Lifestyle  . Physical activity    Days per week: 0 days    Minutes per session: 0 min  . Stress: Not at all  Relationships  . Social Herbalist on phone: Never    Gets together: Twice a week    Attends religious service: 1 to 4 times per year    Active member of club or organization: No    Attends meetings of clubs or organizations: Never    Relationship status: Widowed  . Intimate partner violence    Fear of current or ex  partner: No    Emotionally abused: No    Physically abused: No    Forced sexual activity: No  Other Topics Concern  . Not on file  Social History Narrative   She lives with her daughter.    She also has her nephew and niece at home.    Had a stroke October 2017 and has difficulty walking and also unable to talk since.          Current Outpatient Medications:  .  allopurinol (ZYLOPRIM) 100 MG tablet, TAKE 1 TABLET BY MOUTH EVERY DAY, Disp: 90 tablet, Rfl: 2 .  Cholecalciferol (VITAMIN D3) 2000 UNITS capsule, Take 1 capsule by mouth daily., Disp: , Rfl:  .  clopidogrel (PLAVIX) 75 MG tablet, TAKE 1 TABLET BY MOUTH EVERY DAY, Disp: 90 tablet, Rfl: 0 .  ferrous sulfate 325 (65 FE) MG tablet, Take 325 mg by mouth 2 (two) times daily with a meal., Disp: , Rfl:  .  furosemide (LASIX) 40 MG tablet, Take 1 tablet (40 mg total) by mouth every other day., Disp: 15 tablet, Rfl: 11 .  isosorbide mononitrate (IMDUR) 60 MG 24 hr tablet, TAKE 1 TABLET BY MOUTH EVERY DAY, Disp: 90 tablet, Rfl: 2 .  levothyroxine (SYNTHROID) 50 MCG tablet, Take 1 tablet (50 mcg total) by mouth daily at 6 (six) AM. In place of 75 mcg to take one daily, Disp: 90 tablet, Rfl: 0 .  pantoprazole (PROTONIX) 40 MG tablet, Take 1 tablet (40 mg total) by mouth daily., Disp: 90 tablet, Rfl: 1 .  raloxifene (EVISTA) 60 MG tablet, Take 1 tablet (60 mg total) by mouth daily., Disp: 90 tablet, Rfl: 2 .  rosuvastatin (CRESTOR) 10 MG tablet, Take 1 tablet (10 mg total) by mouth daily., Disp: 90 tablet, Rfl: 1 .  sodium bicarbonate 650 MG tablet, TAKE 2 TABLETS (1,300 MG TOTAL) BY MOUTH TWO (2) TIMES A DAY., Disp: , Rfl: 11  No Known Allergies  I personally reviewed active problem list, medication list, allergies, family history, social history with the patient/caregiver today.   ROS  Ten systems reviewed and is negative except as mentioned in HPI   Objective  Virtual encounter, vitals done at home   Vitals:   04/11/19 1201  04/11/19 1202  BP: (!) 152/78 134/87  Pulse:  62  Temp:  (!) 95.9 F (35.5 C)    There is no height or weight on file to calculate BMI.  Physical Exam  Awake, alert, only nodding her head, sitting in her PJ's without distress  PHQ2/9: Depression screen [facility] 2/9 04/11/2019 01/07/2019 08/02/2018 03/29/2018 11/27/2017  Decreased Interest 0 0 0 0 0  Down, Depressed, Hopeless 0 0 0 0 0  PHQ - 2 Score 0 0 0 0 0  Altered sleeping 0 0 0 3 0  Tired, decreased energy 0 0 0 0 0  Change in appetite 0 0 0 0 0  Feeling bad or failure about yourself  0 0 0 0 0  Trouble concentrating 0 0 0 0 0  Moving slowly or fidgety/restless 0 0 0 0 0  Suicidal thoughts 0 0 0 0 0  PHQ-9 Score 0 0 0 3 0  Difficult doing work/chores Not difficult at all - Not difficult at all Not difficult at all Not difficult at all   PHQ-2/9 Result is negative.    Fall Risk: Fall Risk  04/11/2019 01/07/2019 10/25/2018 08/02/2018 03/29/2018  Falls in the past year? 0 0 0 0 Yes  Comment - - - - -  Number falls in past yr: 0 0 0 0 2 or more  Injury with Fall? 0 0 0 0 Yes  Comment - - - - -  Risk Factor Category  - - - - High Fall Risk  Risk for fall due to : - - - - History of fall(s);Impaired balance/gait;Impaired mobility  Risk for fall due to: Comment - - - - -  Follow up - - - - Education provided     Assessment & Plan  1. Hemiparesis affecting right side as late effect of cerebrovascular accident (CVA) ([place])  - clopidogrel (PLAVIX) 75 MG tablet; Take 1 tablet (75 mg total) by mouth daily.  Dispense: 90 tablet; Refill: 0  2. Dysarthria as late effect of cerebrovascular accident (CVA)  - clopidogrel (PLAVIX) 75 MG tablet; Take 1 tablet (75 mg total) by mouth daily.  Dispense: 90 tablet; Refill: 0  3. Arteriosclerosis of coronary artery  - rosuvastatin (CRESTOR)  10 MG tablet; Take 1 tablet (10 mg total) by mouth daily.  Dispense: 90 tablet; Refill: 1 - clopidogrel (PLAVIX) 75 MG tablet; Take 1 tablet (75 mg total) by  mouth daily.  Dispense: 90 tablet; Refill: 0  4. Chronic stable angina (HCC)  - clopidogrel (PLAVIX) 75 MG tablet; Take 1 tablet (75 mg total) by mouth daily.  Dispense: 90 tablet; Refill: 0  5. Adult hypothyroidism  - levothyroxine (SYNTHROID) 50 MCG tablet; Take 1 tablet (50 mcg total) by mouth daily at 6 (six) AM.  Dispense: 90 tablet; Refill: 0  6. Dyslipidemia  - rosuvastatin (CRESTOR) 10 MG tablet; Take 1 tablet (10 mg total) by mouth daily.  Dispense: 90 tablet; Refill: 1  I discussed the assessment and treatment plan with the patient. The patient was provided an opportunity to ask questions and all were answered. The patient agreed with the plan and demonstrated an understanding of the instructions.  The patient was advised to call back or seek an in-person evaluation if the symptoms worsen or if the condition fails to improve as anticipated.  I provided 25  minutes of non-face-to-face time during this encounter.

## 2019-05-30 DIAGNOSIS — E7800 Pure hypercholesterolemia, unspecified: Secondary | ICD-10-CM | POA: Diagnosis not present

## 2019-05-30 DIAGNOSIS — I10 Essential (primary) hypertension: Secondary | ICD-10-CM | POA: Diagnosis not present

## 2019-05-30 DIAGNOSIS — I5030 Unspecified diastolic (congestive) heart failure: Secondary | ICD-10-CM | POA: Diagnosis not present

## 2019-05-30 DIAGNOSIS — I2510 Atherosclerotic heart disease of native coronary artery without angina pectoris: Secondary | ICD-10-CM | POA: Diagnosis not present

## 2019-05-30 DIAGNOSIS — I63311 Cerebral infarction due to thrombosis of right middle cerebral artery: Secondary | ICD-10-CM | POA: Diagnosis not present

## 2019-06-02 ENCOUNTER — Ambulatory Visit (INDEPENDENT_AMBULATORY_CARE_PROVIDER_SITE_OTHER): Payer: Medicare Other

## 2019-06-02 ENCOUNTER — Other Ambulatory Visit: Payer: Self-pay

## 2019-06-02 DIAGNOSIS — E785 Hyperlipidemia, unspecified: Secondary | ICD-10-CM | POA: Diagnosis not present

## 2019-06-02 DIAGNOSIS — M109 Gout, unspecified: Secondary | ICD-10-CM | POA: Diagnosis not present

## 2019-06-02 DIAGNOSIS — I10 Essential (primary) hypertension: Secondary | ICD-10-CM

## 2019-06-02 DIAGNOSIS — E559 Vitamin D deficiency, unspecified: Secondary | ICD-10-CM | POA: Diagnosis not present

## 2019-06-02 DIAGNOSIS — D638 Anemia in other chronic diseases classified elsewhere: Secondary | ICD-10-CM | POA: Diagnosis not present

## 2019-06-02 DIAGNOSIS — N185 Chronic kidney disease, stage 5: Secondary | ICD-10-CM

## 2019-06-02 DIAGNOSIS — Z23 Encounter for immunization: Secondary | ICD-10-CM

## 2019-06-02 DIAGNOSIS — E039 Hypothyroidism, unspecified: Secondary | ICD-10-CM | POA: Diagnosis not present

## 2019-06-03 LAB — COMPLETE METABOLIC PANEL WITH GFR
AG Ratio: 1.6 (calc) (ref 1.0–2.5)
ALT: 12 U/L (ref 6–29)
AST: 17 U/L (ref 10–35)
Albumin: 3.8 g/dL (ref 3.6–5.1)
Alkaline phosphatase (APISO): 55 U/L (ref 37–153)
BUN/Creatinine Ratio: 15 (calc) (ref 6–22)
BUN: 43 mg/dL — ABNORMAL HIGH (ref 7–25)
CO2: 25 mmol/L (ref 20–32)
Calcium: 9.4 mg/dL (ref 8.6–10.4)
Chloride: 108 mmol/L (ref 98–110)
Creat: 2.94 mg/dL — ABNORMAL HIGH (ref 0.60–0.88)
GFR, Est African American: 15 mL/min/{1.73_m2} — ABNORMAL LOW (ref >=60)
GFR, Est Non African American: 13 mL/min/{1.73_m2} — ABNORMAL LOW (ref >=60)
Globulin: 2.4 g/dL (calc) (ref 1.9–3.7)
Glucose, Bld: 90 mg/dL (ref 65–99)
Potassium: 4.2 mmol/L (ref 3.5–5.3)
Sodium: 142 mmol/L (ref 135–146)
Total Bilirubin: 0.4 mg/dL (ref 0.2–1.2)
Total Protein: 6.2 g/dL (ref 6.1–8.1)

## 2019-06-03 LAB — CBC WITH DIFFERENTIAL/PLATELET
Absolute Monocytes: 399 {cells}/uL (ref 200–950)
Basophils Absolute: 51 {cells}/uL (ref 0–200)
Basophils Relative: 0.9 %
Eosinophils Absolute: 200 {cells}/uL (ref 15–500)
Eosinophils Relative: 3.5 %
HCT: 31.8 % — ABNORMAL LOW (ref 35.0–45.0)
Hemoglobin: 10.2 g/dL — ABNORMAL LOW (ref 11.7–15.5)
Lymphs Abs: 1471 {cells}/uL (ref 850–3900)
MCH: 31 pg (ref 27.0–33.0)
MCHC: 32.1 g/dL (ref 32.0–36.0)
MCV: 96.7 fL (ref 80.0–100.0)
MPV: 10.8 fL (ref 7.5–12.5)
Monocytes Relative: 7 %
Neutro Abs: 3580 {cells}/uL (ref 1500–7800)
Neutrophils Relative %: 62.8 %
Platelets: 196 Thousand/uL (ref 140–400)
RBC: 3.29 Million/uL — ABNORMAL LOW (ref 3.80–5.10)
RDW: 13.4 % (ref 11.0–15.0)
Total Lymphocyte: 25.8 %
WBC: 5.7 Thousand/uL (ref 3.8–10.8)

## 2019-06-03 LAB — LIPID PANEL
Cholesterol: 152 mg/dL (ref <200)
HDL: 61 mg/dL (ref >=50)
LDL Cholesterol (Calc): 74 mg/dL (calc)
Non-HDL Cholesterol (Calc): 91 mg/dL (calc) (ref <130)
Total CHOL/HDL Ratio: 2.5 (calc) (ref <5.0)
Triglycerides: 83 mg/dL (ref <150)

## 2019-06-03 LAB — VITAMIN D 25 HYDROXY (VIT D DEFICIENCY, FRACTURES): Vit D, 25-Hydroxy: 44 ng/mL (ref 30–100)

## 2019-06-03 LAB — TSH: TSH: 2.21 mIU/L (ref 0.40–4.50)

## 2019-06-03 LAB — MICROALBUMIN / CREATININE URINE RATIO
Creatinine, Urine: 56 mg/dL (ref 20–275)
Microalb Creat Ratio: 45 ug/mg{creat} — ABNORMAL HIGH
Microalb, Ur: 2.5 mg/dL

## 2019-06-03 LAB — URIC ACID: Uric Acid, Serum: 5.1 mg/dL (ref 2.5–7.0)

## 2019-06-03 LAB — PARATHYROID HORMONE, INTACT (NO CA): PTH: 223 pg/mL — ABNORMAL HIGH (ref 14–64)

## 2019-06-17 ENCOUNTER — Telehealth: Payer: Self-pay | Admitting: Family Medicine

## 2019-06-17 NOTE — Chronic Care Management (AMB) (Signed)
°  Chronic Care Management   Outreach Note  06/17/2019 Name: Crystal Faulkner MRN: [id number] DOB: [birth year]  Referred by: Steele Sizer, MD Reason for referral : Chronic Care Management (Initial CCM outreach was unsuccessful. )   An unsuccessful telephone outreach was attempted today. The patient was referred to the case management team by for assistance with care management and care coordination.   Follow Up Plan: The care management team will reach out to the patient again over the next 7 days.   [place]  [email or web address]   ??[phone]

## 2019-06-19 ENCOUNTER — Other Ambulatory Visit: Payer: Self-pay | Admitting: Family Medicine

## 2019-06-19 DIAGNOSIS — I208 Other forms of angina pectoris: Secondary | ICD-10-CM

## 2019-06-22 NOTE — Chronic Care Management (AMB) (Signed)
°  Chronic Care Management   Outreach Note  06/22/2019 Name: Crystal Faulkner MRN: [id number] DOB: Jan 02, 1925  Referred by: Steele Sizer, MD Reason for referral : Chronic Care Management (Initial CCM outreach was unsuccessful. ) and Chronic Care Management (Second CCM outreach was unsuccessful.)   A second unsuccessful telephone outreach was attempted today. The patient was referred to the case management team for assistance with care management and care coordination.   Follow Up Plan: A HIPPA compliant phone message was left for the patient providing contact information and requesting a return call.  The care management team will reach out to the patient again over the next 7 days.  If patient returns call to provider office, please advise to call [place] at [place]  [email or web address]   ??[phone]

## 2019-06-24 ENCOUNTER — Ambulatory Visit: Payer: Self-pay | Admitting: *Deleted

## 2019-06-24 NOTE — Telephone Encounter (Signed)
Summary: medication side effect   Pt's daughter calling. States that pt is on clopidogrel (PLAVIX) 75 MG tablet and is starting to bruise very badly. Pt wants to know if this is normal.      Call to daughter- they are concerned that mother is bruising very easily on her Plavix and it is becoming a problem. Call to office- they will see her Monday with instruction to be seen over weekend if concerns become worse.  Reason for Disposition . Taking Coumadin (warfarin) or other strong blood thinner, or known bleeding disorder (e.g., thrombocytopenia)  Answer Assessment - Initial Assessment Questions 1. APPEARANCE of BRUISE: "Describe the bruise."      Daughter states mother is bruising just with rubbing up against things 2. SIZE: "How large is the bruise?"      Size of tangerine 3. NUMBER: "How many bruises are there?"      1 on foot last week and now on front of leg 4. LOCATION: "Where is the bruise located?"      See above 5. ONSET: "How long ago did the bruise occur?"      months 6. CAUSE: "Tell me how it happened."     Patient can just brush up against something and bruise  7. MEDICAL HISTORY: "Do you have any medical problems that can cause easy bruising or bleeding?" (e.g., leukemia, liver disease, recent chemotherapy)     Heart attack and stroke- on Plavix 8. MEDICATIONS : "Do you take any medications which thin the blood such as: aspirin, heparin, ibuprofen (NSAIDS), Plavix, or Coumadin?"     Plavix and possible aspirin 9. OTHER SYMPTOMS: "Do you have any other symptoms?"  (e.g., weakness, dizziness, pain, fever, nosebleed, blood in urine/stool)     no 10. PREGNANCY: "Is there any chance you are pregnant?" "When was your last menstrual period?"       n/a  Protocols used: BRUISES-A-AH

## 2019-06-24 NOTE — Telephone Encounter (Signed)
Spoke with patient's daughter Yusra Chavers and offer an appointment with Dr.Sowles for Monday, but daughter stated that she had to work and is off on Friday, 07/01/2019.  [name] stated that it wasn't urgent she just wanted to know if it was normal for her mom to bruise so easily on Plavix.  I also informed daughter that Dr. Ancil Boozer was already gone for the day and that she would receive a return call sometime on Monday and if she feels as though mom needs to be seen over the weekend to take her to [facility] or ER.  Daughter verbalized understanding and stated again that it wasn't urgent.

## 2019-06-25 NOTE — Telephone Encounter (Signed)
See follow up note. / Closing encounter.

## 2019-06-27 NOTE — Telephone Encounter (Signed)
Patient called. Patient's caretaker is aware.

## 2019-07-08 ENCOUNTER — Other Ambulatory Visit: Payer: Self-pay | Admitting: Family Medicine

## 2019-07-08 DIAGNOSIS — E039 Hypothyroidism, unspecified: Secondary | ICD-10-CM

## 2019-07-11 NOTE — Chronic Care Management (AMB) (Signed)
Chronic Care Management   Note  07/11/2019 Name: Crystal Faulkner MRN: [id number] DOB: Mar 28, 1925  Crystal Faulkner is a 85 y.o. year old female who is a primary care patient of Steele Sizer, MD. I reached out to [place] by phone today in response to a referral sent by [name]'s health plan.     [name] was given information about Chronic Care Management services today including:  1. CCM service includes personalized support from designated clinical staff supervised by her physician, including individualized plan of care and coordination with other care providers 2. 24/7 contact phone numbers for assistance for urgent and routine care needs. 3. Service will only be billed when office clinical staff spend 20 minutes or more in a month to coordinate care. 4. Only one practitioner may furnish and bill the service in a calendar month. 5. The patient may stop CCM services at any time (effective at the end of the month) by phone call to the office staff. 6. The patient will be responsible for cost sharing (co-pay) of up to 20% of the service fee (after annual deductible is met).  Patients daughter [name] did not agree to enrollment in care management services and does not wish to consider at this time.  Follow up plan: The patient has been provided with contact information for the chronic care management team and has been advised to call with any health related questions or concerns.   [place], [place] [address] Direct Dial: [email or web address]  Website: [email or web address]

## 2019-07-11 NOTE — Chronic Care Management (AMB) (Signed)
°  Chronic Care Management   Outreach Note  07/11/2019 Name: Crystal Faulkner MRN: [id number] DOB: [birth year]  Referred by: Steele Sizer, MD Reason for referral : Chronic Care Management (Initial CCM outreach was unsuccessful. ), Chronic Care Management (Second CCM outreach was unsuccessful.), and Chronic Care Management (Third CCM outreach was unsuccessful.)   Third unsuccessful telephone outreach was attempted today. The patient was referred to the case management team for assistance with care management and care coordination. The patient's primary care provider has been notified of our unsuccessful attempts to make or maintain contact with the patient. The care management team is pleased to engage with this patient at any time in the future should he/she be interested in assistance from the care management team.   Follow Up Plan: The care management team is available to follow up with the patient after provider conversation with the patient regarding recommendation for care management engagement and subsequent re-referral to the care management team.   [place], [place] [facility]  [place], [place] [address] Direct Dial: [email or web address]  Website: [email or web address]

## 2019-08-04 ENCOUNTER — Other Ambulatory Visit: Payer: Self-pay | Admitting: Family Medicine

## 2019-08-04 DIAGNOSIS — M109 Gout, unspecified: Secondary | ICD-10-CM

## 2019-08-26 ENCOUNTER — Other Ambulatory Visit: Payer: Self-pay

## 2019-08-26 ENCOUNTER — Emergency Department: Payer: Medicare Other

## 2019-08-26 ENCOUNTER — Encounter: Payer: Self-pay | Admitting: Emergency Medicine

## 2019-08-26 ENCOUNTER — Emergency Department
Admission: EM | Admit: 2019-08-26 | Discharge: 2019-08-27 | Disposition: A | Discharge status: Home / Self Care | Payer: Medicare Other | Attending: Emergency Medicine | Admitting: Emergency Medicine

## 2019-08-26 DIAGNOSIS — S8001XA Contusion of right knee, initial encounter: Secondary | ICD-10-CM | POA: Insufficient documentation

## 2019-08-26 DIAGNOSIS — N185 Chronic kidney disease, stage 5: Secondary | ICD-10-CM | POA: Diagnosis not present

## 2019-08-26 DIAGNOSIS — I10 Essential (primary) hypertension: Secondary | ICD-10-CM | POA: Diagnosis not present

## 2019-08-26 DIAGNOSIS — W010XXA Fall on same level from slipping, tripping and stumbling without subsequent striking against object, initial encounter: Secondary | ICD-10-CM | POA: Diagnosis not present

## 2019-08-26 DIAGNOSIS — W19XXXA Unspecified fall, initial encounter: Secondary | ICD-10-CM | POA: Diagnosis not present

## 2019-08-26 DIAGNOSIS — Z7902 Long term (current) use of antithrombotics/antiplatelets: Secondary | ICD-10-CM | POA: Diagnosis not present

## 2019-08-26 DIAGNOSIS — Y92008 Other place in unspecified non-institutional (private) residence as the place of occurrence of the external cause: Secondary | ICD-10-CM | POA: Diagnosis not present

## 2019-08-26 DIAGNOSIS — S61412A Laceration without foreign body of left hand, initial encounter: Secondary | ICD-10-CM | POA: Diagnosis not present

## 2019-08-26 DIAGNOSIS — Y999 Unspecified external cause status: Secondary | ICD-10-CM | POA: Insufficient documentation

## 2019-08-26 DIAGNOSIS — I132 Hypertensive heart and chronic kidney disease with heart failure and with stage 5 chronic kidney disease, or end stage renal disease: Secondary | ICD-10-CM | POA: Insufficient documentation

## 2019-08-26 DIAGNOSIS — R52 Pain, unspecified: Secondary | ICD-10-CM

## 2019-08-26 DIAGNOSIS — T148XXA Other injury of unspecified body region, initial encounter: Secondary | ICD-10-CM

## 2019-08-26 DIAGNOSIS — S41111A Laceration without foreign body of right upper arm, initial encounter: Secondary | ICD-10-CM | POA: Insufficient documentation

## 2019-08-26 DIAGNOSIS — S0990XA Unspecified injury of head, initial encounter: Secondary | ICD-10-CM | POA: Diagnosis not present

## 2019-08-26 DIAGNOSIS — S81811A Laceration without foreign body, right lower leg, initial encounter: Secondary | ICD-10-CM | POA: Insufficient documentation

## 2019-08-26 DIAGNOSIS — I5032 Chronic diastolic (congestive) heart failure: Secondary | ICD-10-CM | POA: Insufficient documentation

## 2019-08-26 DIAGNOSIS — G309 Alzheimer's disease, unspecified: Secondary | ICD-10-CM | POA: Diagnosis not present

## 2019-08-26 DIAGNOSIS — S6992XA Unspecified injury of left wrist, hand and finger(s), initial encounter: Secondary | ICD-10-CM | POA: Diagnosis not present

## 2019-08-26 DIAGNOSIS — R0902 Hypoxemia: Secondary | ICD-10-CM | POA: Diagnosis not present

## 2019-08-26 DIAGNOSIS — S61213A Laceration without foreign body of left middle finger without damage to nail, initial encounter: Secondary | ICD-10-CM | POA: Insufficient documentation

## 2019-08-26 DIAGNOSIS — Y9301 Activity, walking, marching and hiking: Secondary | ICD-10-CM | POA: Diagnosis not present

## 2019-08-26 DIAGNOSIS — S81812A Laceration without foreign body, left lower leg, initial encounter: Secondary | ICD-10-CM | POA: Diagnosis not present

## 2019-08-26 MED ORDER — ACETAMINOPHEN 160 MG/5ML PO SOLN
650.0000 mg | Freq: Once | ORAL | Status: AC
  Administered 2019-08-27: 650 mg via ORAL
  Filled 2019-08-26: qty 20.3
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
## 2019-08-27 ENCOUNTER — Emergency Department: Payer: Medicare Other

## 2019-08-27 DIAGNOSIS — S61213A Laceration without foreign body of left middle finger without damage to nail, initial encounter: Secondary | ICD-10-CM | POA: Diagnosis not present

## 2019-08-27 DIAGNOSIS — S6992XA Unspecified injury of left wrist, hand and finger(s), initial encounter: Secondary | ICD-10-CM | POA: Diagnosis not present

## 2019-08-27 NOTE — ED Notes (Signed)
Patient transported to CT or DG; this RN to room with tylenol from pharm att

## 2019-08-27 NOTE — ED Notes (Signed)
Patient transported to X-ray 
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
## 2019-08-27 NOTE — Discharge Instructions (Addendum)

## 2019-08-27 NOTE — ED Notes (Signed)
Pt has skin tears and bruising at right upper arm, right lower leg, and left middle finger, pt has blood appearing at bandages that were applied prior to EMS arrival   CMS intact to extremities

## 2019-08-27 NOTE — ED Notes (Signed)
No peripheral IV placed this visit.    Discharge instructions reviewed with patient. Questions fielded by this RN. Patient verbalizes understanding of instructions. Patient discharged home in stable condition per [facility] . No acute distress noted at time of discharge.   Pt wheeled and loaded into car, given warm blanket owing to cold and frailty of pt

## 2019-09-06 ENCOUNTER — Telehealth: Payer: Self-pay | Admitting: Family Medicine

## 2019-09-06 NOTE — Telephone Encounter (Signed)
Pt daughter Crystal Faulkner is calling and her mother having confusion again and daughter said she is acting like she did the last time she had UTI this has been going on for 2 day . Daughter would like abx call into [facility] [address] in [place]. Pt daughter is aware someone will call her back

## 2019-09-06 NOTE — Telephone Encounter (Signed)
Called and spoke with patient's daughter, [name], due to [name] not being available and informed her that Dr. Ancil Boozer stated she would not be able to send in an antibiotic due to patient needing to be evaluated either here at the office or at [facility].  [name] stated that she would relay the message to patient's other daughter, [name].

## 2019-10-08 ENCOUNTER — Other Ambulatory Visit: Payer: Self-pay | Admitting: Family Medicine

## 2019-10-08 DIAGNOSIS — I69351 Hemiplegia and hemiparesis following cerebral infarction affecting right dominant side: Secondary | ICD-10-CM

## 2019-10-08 DIAGNOSIS — I2510 Atherosclerotic heart disease of native coronary artery without angina pectoris: Secondary | ICD-10-CM

## 2019-10-08 DIAGNOSIS — I69322 Dysarthria following cerebral infarction: Secondary | ICD-10-CM

## 2019-10-08 DIAGNOSIS — I208 Other forms of angina pectoris: Secondary | ICD-10-CM

## 2019-10-17 ENCOUNTER — Ambulatory Visit (INDEPENDENT_AMBULATORY_CARE_PROVIDER_SITE_OTHER): Payer: Medicare Other | Admitting: Family Medicine

## 2019-10-17 ENCOUNTER — Ambulatory Visit: Payer: Medicare Other | Admitting: Family Medicine

## 2019-10-17 ENCOUNTER — Encounter: Payer: Self-pay | Admitting: Family Medicine

## 2019-10-17 VITALS — BP 119/76 | HR 63 | Ht 60.0 in

## 2019-10-17 DIAGNOSIS — I208 Other forms of angina pectoris: Secondary | ICD-10-CM | POA: Diagnosis not present

## 2019-10-17 DIAGNOSIS — I10 Essential (primary) hypertension: Secondary | ICD-10-CM | POA: Diagnosis not present

## 2019-10-17 DIAGNOSIS — M109 Gout, unspecified: Secondary | ICD-10-CM

## 2019-10-17 DIAGNOSIS — I69351 Hemiplegia and hemiparesis following cerebral infarction affecting right dominant side: Secondary | ICD-10-CM

## 2019-10-17 DIAGNOSIS — E559 Vitamin D deficiency, unspecified: Secondary | ICD-10-CM | POA: Diagnosis not present

## 2019-10-17 DIAGNOSIS — E039 Hypothyroidism, unspecified: Secondary | ICD-10-CM

## 2019-10-17 DIAGNOSIS — D638 Anemia in other chronic diseases classified elsewhere: Secondary | ICD-10-CM | POA: Diagnosis not present

## 2019-10-17 DIAGNOSIS — I69322 Dysarthria following cerebral infarction: Secondary | ICD-10-CM

## 2019-10-17 DIAGNOSIS — I700 Atherosclerosis of aorta: Secondary | ICD-10-CM

## 2019-10-17 DIAGNOSIS — D692 Other nonthrombocytopenic purpura: Secondary | ICD-10-CM

## 2019-10-17 DIAGNOSIS — N185 Chronic kidney disease, stage 5: Secondary | ICD-10-CM | POA: Diagnosis not present

## 2019-10-17 DIAGNOSIS — E785 Hyperlipidemia, unspecified: Secondary | ICD-10-CM

## 2019-10-17 DIAGNOSIS — M810 Age-related osteoporosis without current pathological fracture: Secondary | ICD-10-CM | POA: Diagnosis not present

## 2019-10-17 DIAGNOSIS — N2581 Secondary hyperparathyroidism of renal origin: Secondary | ICD-10-CM

## 2019-10-17 DIAGNOSIS — I2510 Atherosclerotic heart disease of native coronary artery without angina pectoris: Secondary | ICD-10-CM

## 2019-10-17 DIAGNOSIS — K219 Gastro-esophageal reflux disease without esophagitis: Secondary | ICD-10-CM

## 2019-10-17 DIAGNOSIS — I5032 Chronic diastolic (congestive) heart failure: Secondary | ICD-10-CM

## 2019-10-17 MED ORDER — CLOPIDOGREL BISULFATE 75 MG PO TABS: 75.0000 mg | ORAL_TABLET | Freq: Every day | ORAL | 1 refills | Status: DC

## 2019-10-17 MED ORDER — LEVOTHYROXINE SODIUM 50 MCG PO TABS: 50.0000 ug | ORAL_TABLET | Freq: Every day | ORAL | 1 refills | Status: DC

## 2019-10-17 MED ORDER — PANTOPRAZOLE SODIUM 40 MG PO TBEC: 40.0000 mg | DELAYED_RELEASE_TABLET | Freq: Every day | ORAL | 1 refills | Status: DC

## 2019-10-17 MED ORDER — ROSUVASTATIN CALCIUM 10 MG PO TABS: 10.0000 mg | ORAL_TABLET | Freq: Every day | ORAL | 1 refills | Status: DC

## 2019-10-17 MED ORDER — RALOXIFENE HCL 60 MG PO TABS: 60.0000 mg | ORAL_TABLET | Freq: Every day | ORAL | 2 refills | Status: DC

## 2019-10-17 NOTE — Progress Notes (Signed)
Name: Crystal Faulkner   MRN: [id number]    DOB: 1925-01-05   Date:10/17/2019       Progress Note  Subjective  Chief Complaint  Chief Complaint  Patient presents with  . Medication Refill  . Hypothyroidism  . Hypertension    Denies any symptoms  . Gastroesophageal Reflux    Would like to discuss long term effects of taking medication daily  . Dementia  . Gout  . CKI    I connected with  [name] on 10/17/19 at 11:40 AM EST by telephone and verified that I am speaking with the correct person using two identifiers.  I discussed the limitations, risks, security and privacy concerns of performing an evaluation and management service by telephone and the availability of in person appointments. Staff also discussed with the patient that there may be a patient responsible charge related to this service. Patient Location: at her house, lives with daughter [name]  Provider Location: [facility] Additional Individuals present: [name]   HPI  CVA : She had a CVA back in October 2017, she has dysarthria , rightside hemiparesisshe uses a walkerwhen at home but wheelchair when she goes out with family.Family has to crush her medications and mix in apple sauce. She does not speak, but nods her head and smiles.She points to body parts. Still under daughter's care. She lives with Pete Pelt ( one of her daughter's). Per daughter no changes since last visit  Recent Fall: she has severe OA, she fell going out of the front door with her walker went to [facility] 09/06/2019 but no fractures, bruises healed now   Hypothyroidism: taking medication daily as prescribed, no constipation, she has dry skin.,taking levothyroxine 50 mcg daily , last TSH done 05/2019 was at goal   Senile purpura: bruises easily and takes aspirin, explained common to have it at her age.Unchanged   HTN: ,she has ESRD and is [facility] in [place], bp has  been well controlled at home   Angina: taking Imdur daily and denies chest pain, also taking statintherapy on plavix. She sees cardiologist - Dr. Saralyn Pilar, last visit Nov 2019 . Daughter's denies orthopnea or wheezing. She has Chronic diastolic CHF, she is unable to communicate verbally, so unsure if sehe has pain.   GERD:takes medications, symptoms seems to be controlled, since daughter states no problems eating, she does not seem to be in pain   Dementia: stable, no wondering and eats by herself, daughter is now bathing her, preparing her meals, needs help coming to the doctor and family member manages her money and dispense her medication. Shelives with herDaughter [name] that hasretired and gives 24 hour care.Unchanged    CKI with anemia or chronic disease: seeing nephrologist, refuses HD, no pruritus.Reviewed last labs  Controlled Gout: no recent episodes, she takes allopurinol and no rashes  Atherosclerosis aorta: she is on statin therapy, but she has stopped taking aspirin because she is on Plavix. Unchanged     Patient Active Problem List   Diagnosis Date Noted  . GI bleed 10/15/2018  . UTI (urinary tract infection) 10/15/2018  . Osteoarthritis 11/19/2017  . Altered mental status 02/26/2017  . Dysarthria due to recent cerebrovascular accident (CVA) 06/23/2016  . Hemiparesis affecting right side as late effect of cerebrovascular accident (CVA) ([place]) 06/23/2016  . Hypotension 06/23/2016  . CKD (chronic kidney disease) stage 5, GFR less than 15 ml/min (HCC) 06/23/2016  . Anemia of chronic disease 06/23/2016  .  Encephalopathy, metabolic [id number]  . Intermittent confusion 06/21/2016  . Senile purpura ([place]) 05/13/2016  . Hiatal hernia 05/04/2016  . Thoracic aorta atherosclerosis ([place]) 05/04/2016  . Chronic stable angina ([place]) 10/16/2015  . Secondary hyperparathyroidism ([place]) 04/13/2015  . Arteriosclerosis of coronary artery 04/08/2015  . Benign hypertension  04/08/2015  . Controlled gout 04/08/2015  . History of peptic ulcer disease 04/08/2015  . Adult hypothyroidism 04/08/2015  . Mild cognitive disorder 04/08/2015  . Anemia of chronic disease 04/08/2015  . Osteoarthrosis involving more than one site but not generalized 04/08/2015  . Osteoporosis 04/08/2015  . Pelvic muscle wasting 04/08/2015  . Allergic rhinitis 04/08/2015  . Tobacco use 04/08/2015  . Chronic diastolic CHF (congestive heart failure) ([place]) 03/29/2014  . HLD (hyperlipidemia) 03/29/2014  . H/O gastrointestinal hemorrhage 04/05/2007    Past Surgical History:  Procedure Laterality Date  . EXPLORATORY LAPAROTOMY    . EYE SURGERY Bilateral    cataract    Family History  Problem Relation Age of Onset  . Diabetes Sister   . Hypertension Sister   . Hypertension Brother   . Heart disease Brother       Tobacco Use: Medium Risk  . Smoking Tobacco Use: Never Smoker  . Smokeless Tobacco Use: Former Systems developer   Social History   Substance and Sexual Activity  Alcohol Use No    Current Outpatient Medications:  .  allopurinol (ZYLOPRIM) 100 MG tablet, TAKE 1 TABLET BY MOUTH EVERY DAY, Disp: 90 tablet, Rfl: 2 .  calcitRIOL (ROCALTROL) 0.25 MCG capsule, Take 0.25 mcg by mouth daily., Disp: , Rfl:  .  clopidogrel (PLAVIX) 75 MG tablet, TAKE 1 TABLET BY MOUTH EVERY DAY, Disp: 30 tablet, Rfl: 0 .  dexlansoprazole (DEXILANT) 60 MG capsule, Take by mouth., Disp: , Rfl:  .  ferrous sulfate 325 (65 FE) MG tablet, Take 325 mg by mouth 2 (two) times daily with a meal., Disp: , Rfl:  .  furosemide (LASIX) 40 MG tablet, Take 1 tablet (40 mg total) by mouth every other day., Disp: 15 tablet, Rfl: 11 .  isosorbide mononitrate (IMDUR) 60 MG 24 hr tablet, TAKE 1 TABLET BY MOUTH EVERY DAY, Disp: 90 tablet, Rfl: 2 .  levothyroxine (SYNTHROID) 50 MCG tablet, TAKE 1 TABLET (50 MCG TOTAL) BY MOUTH DAILY AT 6 (SIX) AM., Disp: 90 tablet, Rfl: 0 .  pantoprazole (PROTONIX) 40 MG tablet, Take 1 tablet  (40 mg total) by mouth daily., Disp: 90 tablet, Rfl: 1 .  raloxifene (EVISTA) 60 MG tablet, Take 1 tablet (60 mg total) by mouth daily., Disp: 90 tablet, Rfl: 2 .  rosuvastatin (CRESTOR) 10 MG tablet, Take 1 tablet (10 mg total) by mouth daily., Disp: 90 tablet, Rfl: 1 .  sodium bicarbonate 650 MG tablet, Take 650 mg by mouth daily. , Disp: , Rfl: 11 .  Cholecalciferol (VITAMIN D3) 2000 UNITS capsule, Take 1 capsule by mouth daily., Disp: , Rfl:   No Known Allergies  I personally reviewed active problem list, medication list, allergies, family history, social history, health maintenance with the patient/caregiver today.   ROS  Ten systems reviewed and is negative except as mentioned in HPI   Objective  Virtual encounter, vitals not obtained.  Body mass index is 27.73 kg/m.  Physical Exam  Awake and cooperative   PHQ2/9: Depression screen [facility] 2/9 10/17/2019 04/11/2019 01/07/2019 08/02/2018 03/29/2018  Decreased Interest 0 0 0 0 0  Down, Depressed, Hopeless 0 0 0 0 0  PHQ - 2 Score 0 0 0  0 0  Altered sleeping 0 0 0 0 3  Tired, decreased energy 0 0 0 0 0  Change in appetite 0 0 0 0 0  Feeling bad or failure about yourself  0 0 0 0 0  Trouble concentrating 0 0 0 0 0  Moving slowly or fidgety/restless 0 0 0 0 0  Suicidal thoughts 0 0 0 0 0  PHQ-9 Score 0 0 0 0 3  Difficult doing work/chores Not difficult at all Not difficult at all - Not difficult at all Not difficult at all  Some recent data might be hidden   PHQ-2/9 Result is negative but patient is non verbal, answers given by daughter  Fall Risk: Fall Risk  10/17/2019 04/11/2019 01/07/2019 10/25/2018 08/02/2018  Falls in the past year? 0 0 0 0 0  Comment - - - - -  Number falls in past yr: 0 0 0 0 0  Injury with Fall? 0 0 0 0 0  Comment - - - - -  Risk Factor Category  - - - - -  Risk for fall due to : - - - - -  Risk for fall due to: Comment - - - - -  Follow up - - - - -     Assessment & Plan  1. Dyslipidemia  -  rosuvastatin (CRESTOR) 10 MG tablet; Take 1 tablet (10 mg total) by mouth daily.  Dispense: 90 tablet; Refill: 1  2. Anemia of chronic disease   3. Vitamin D deficiency   4. Benign hypertension  At goal at home  5. CKD (chronic kidney disease) stage 5, GFR less than 15 ml/min (HCC)  Keep follow up with nephrologist   6. Arteriosclerosis of coronary artery  - clopidogrel (PLAVIX) 75 MG tablet; Take 1 tablet (75 mg total) by mouth daily.  Dispense: 90 tablet; Refill: 1 - rosuvastatin (CRESTOR) 10 MG tablet; Take 1 tablet (10 mg total) by mouth daily.  Dispense: 90 tablet; Refill: 1  7. Dysarthria as late effect of cerebrovascular accident (CVA)  - clopidogrel (PLAVIX) 75 MG tablet; Take 1 tablet (75 mg total) by mouth daily.  Dispense: 90 tablet; Refill: 1  8. Adult hypothyroidism  - levothyroxine (SYNTHROID) 50 MCG tablet; Take 1 tablet (50 mcg total) by mouth daily at 6 (six) AM.  Dispense: 90 tablet; Refill: 1  9. Osteoporosis without current pathological fracture, unspecified osteoporosis type  - raloxifene (EVISTA) 60 MG tablet; Take 1 tablet (60 mg total) by mouth daily.  Dispense: 90 tablet; Refill: 2  10. Thoracic aorta atherosclerosis ([place])  On statin and plavix  11. Chronic stable angina (HCC)  - clopidogrel (PLAVIX) 75 MG tablet; Take 1 tablet (75 mg total) by mouth daily.  Dispense: 90 tablet; Refill: 1  12. Hemiparesis affecting right side as late effect of cerebrovascular accident (CVA) ([place])  - clopidogrel (PLAVIX) 75 MG tablet; Take 1 tablet (75 mg total) by mouth daily.  Dispense: 90 tablet; Refill: 1  13. Controlled gout  Seems to be controlled   14. Senile purpura ([place])  Per daughter stable  15. Secondary hyperparathyroidism ([place])  Managed by nephrologist  16. Chronic diastolic CHF (congestive heart failure) (HCC)  No orthopnea  17. Gastroesophageal reflux disease without esophagitis  - pantoprazole (PROTONIX) 40 MG tablet; Take 1 tablet  (40 mg total) by mouth daily.  Dispense: 90 tablet; Refill: 1  I discussed the assessment and treatment plan with the patient. The patient was provided an opportunity to ask questions  and all were answered. The patient agreed with the plan and demonstrated an understanding of the instructions.   The patient was advised to call back or seek an in-person evaluation if the symptoms worsen or if the condition fails to improve as anticipated.  I provided 25  minutes of non-face-to-face time during this encounter.  Loistine Chance, MD

## 2019-10-23 IMAGING — CT CT CERVICAL SPINE W/O CM
3 of 7 series · 12 of 33 positions shown, 13 images · non-contrast
Comparison: Head CT dated 02/26/2017

CLINICAL DATA: 11-year-old female with multiple fall.

EXAM:
CT HEAD WITHOUT CONTRAST
CT CERVICAL SPINE WITHOUT CONTRAST
TECHNIQUE: Multidetector CT imaging of the head and cervical spine was
performed following the standard protocol without intravenous
contrast. Multiplanar CT image reconstructions of the cervical spine
were also generated.

[Series 4: sagittal bone · sagittal · 0.20mm/px · 5 of 51 slices shown]
[im 9/51  bone]
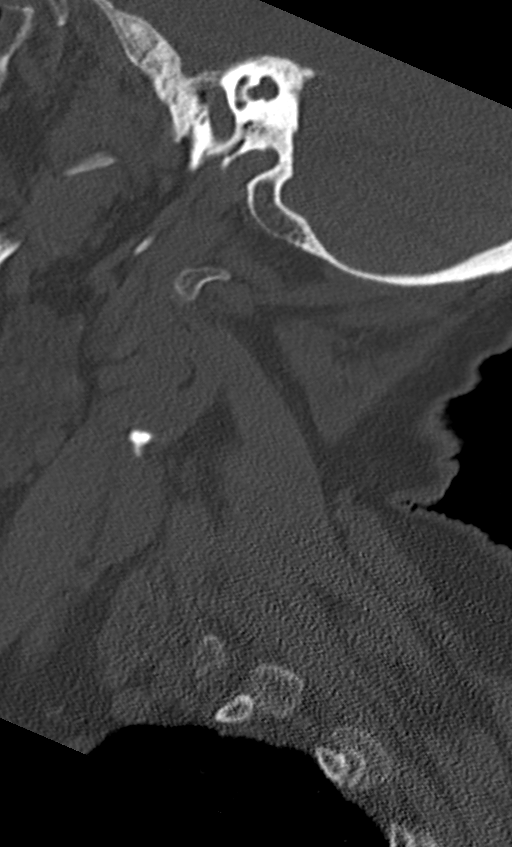
[im 17/51  bone]
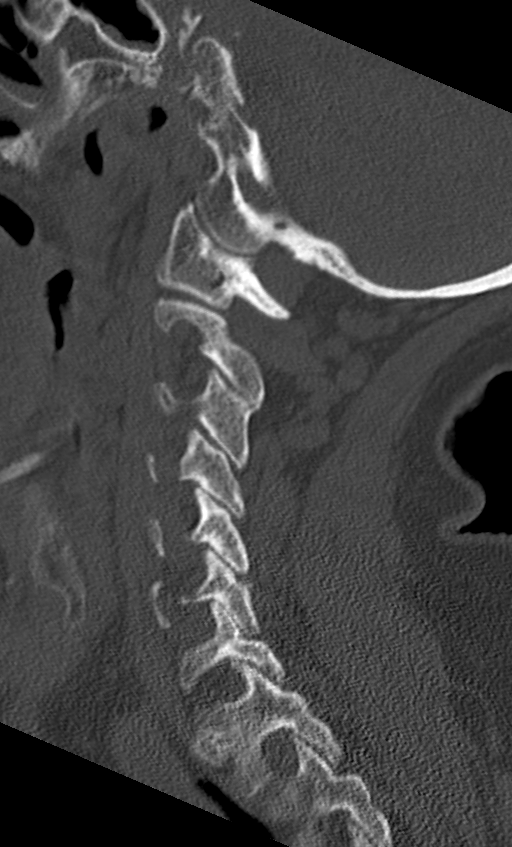
[im 26/51  bone]
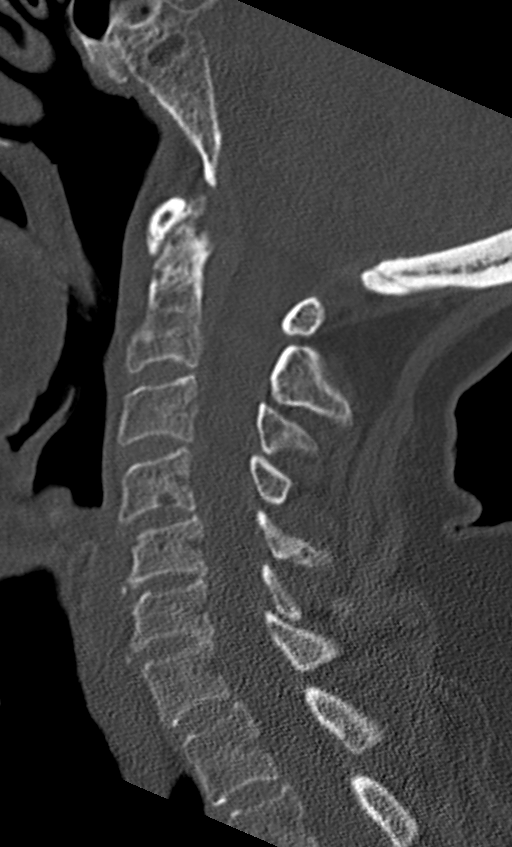
[im 34/51  bone]
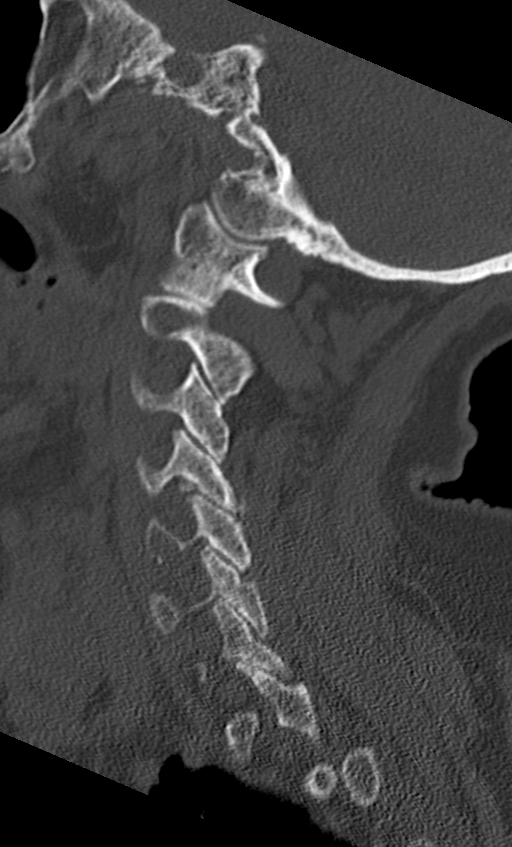
[im 42/51  bone]
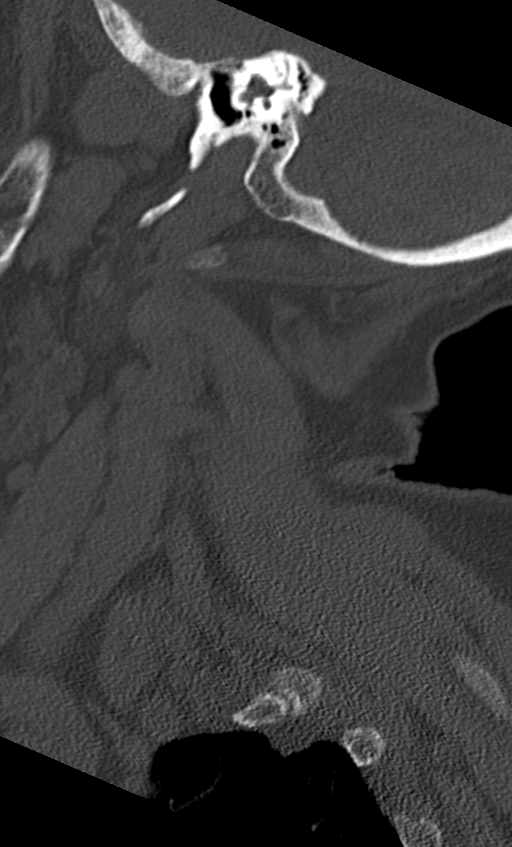

[Series 9: coronal bone · coronal · 0.19mm/px · 3 of 57 slices shown]
[im 15/57  bone]
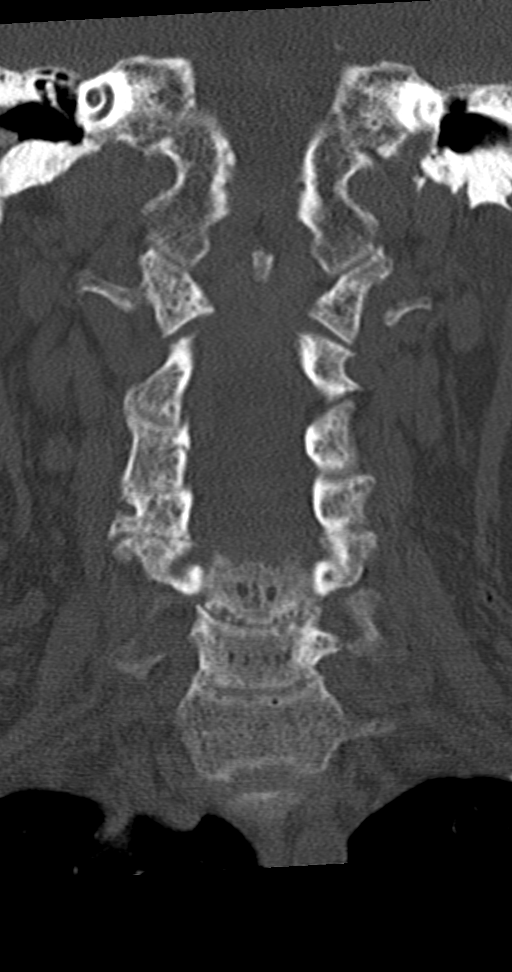
[im 29/57  bone]
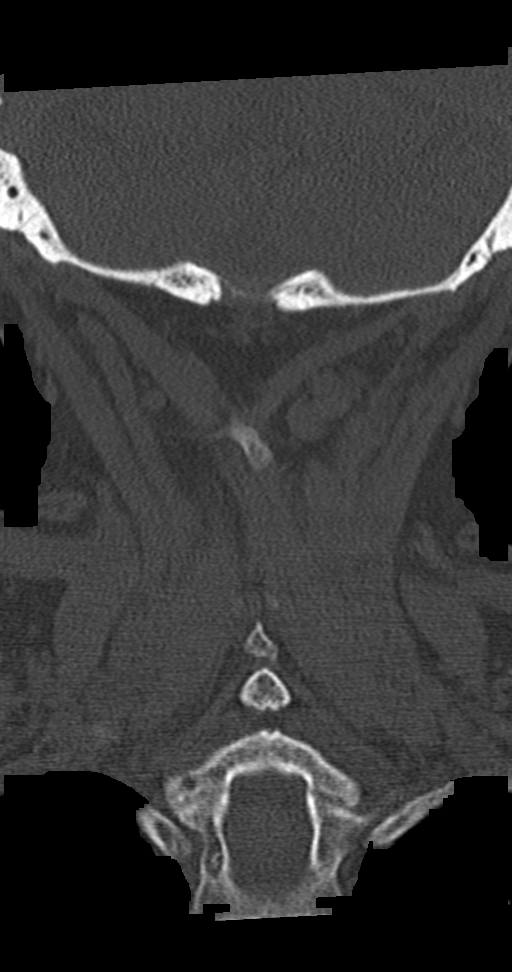
[im 43/57  bone]
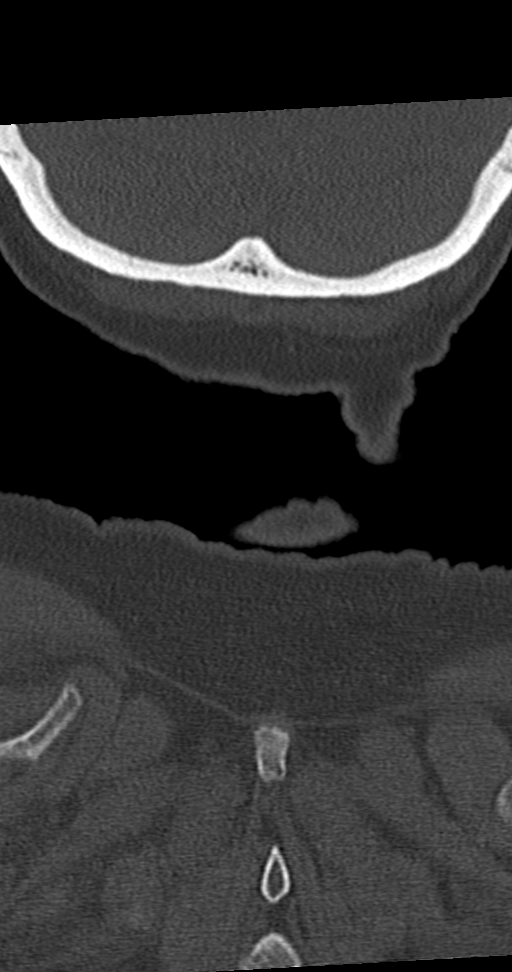

[Series 10: orthogonal bone · axial · 0.20mm/px · z∈[+389,+476]mm · 4 of 81 slices shown, 5 images]
[im 17/81  soft-tissue]
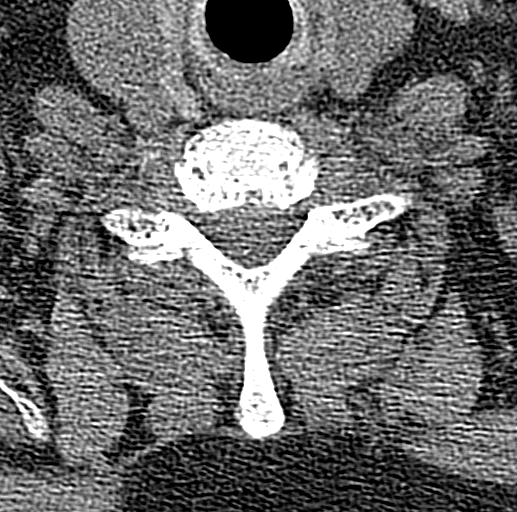
[im 17/81  bone]
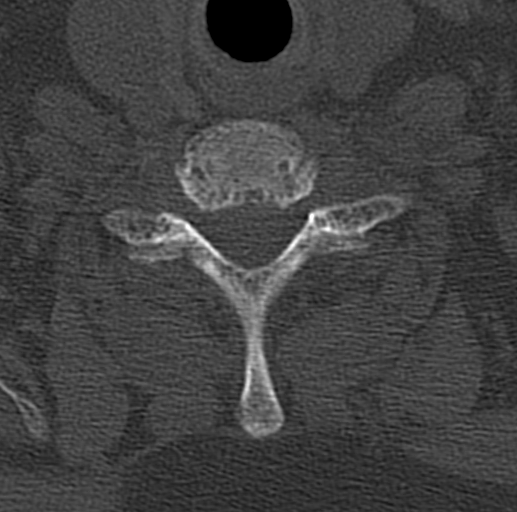
[im 33/81  bone]
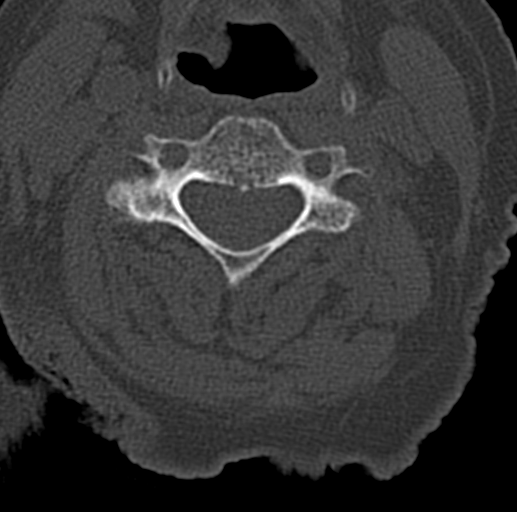
[im 49/81  bone]
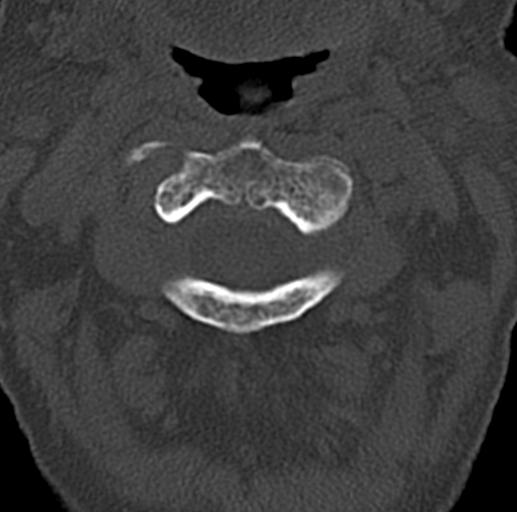
[im 65/81  bone]
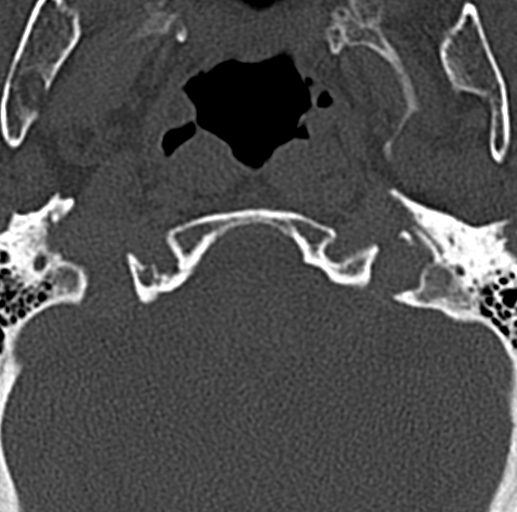

[12 of 33 positions shown; findings below may reference images not displayed]

FINDINGS: CT HEAD FINDINGS

Brain: There is moderate age-related atrophy and chronic
microvascular ischemic changes. Stable appearing left MCA territory
old infarct and encephalomalacia. There is no acute intracranial
hemorrhage. No mass effect or midline shift. No extra-axial fluid
collection.

Vascular: No hyperdense vessel or unexpected calcification.

Skull: Normal. Negative for fracture or focal lesion.

Sinuses/Orbits: There is opacification of multiple ethmoid air cells
and partial opacification of the left frontal and left sinuses. The
mastoid air cells are clear. No air-fluid levels. Maxillary

Other: None

CT CERVICAL SPINE FINDINGS

Alignment: No acute subluxation.

Skull base and vertebrae: No acute fracture.  Osteopenia.

Soft tissues and spinal canal: No prevertebral fluid or swelling. No
visible canal hematoma.

Disc levels:  Mild degenerative changes.

Upper chest: Right apical postsurgical changes and scarring.

Other: None
IMPRESSION: 1. No acute intracranial hemorrhage.
2. Age-related atrophy and chronic microvascular ischemic changes as
well as evidence of left MCA territory old infarct.
3. No acute/traumatic cervical spine pathology.

## 2019-10-31 ENCOUNTER — Other Ambulatory Visit: Payer: Self-pay | Admitting: Family Medicine

## 2019-10-31 DIAGNOSIS — E039 Hypothyroidism, unspecified: Secondary | ICD-10-CM

## 2019-11-08 ENCOUNTER — Emergency Department
Admission: EM | Admit: 2019-11-08 | Discharge: 2019-11-09 | Disposition: A | Discharge status: Home / Self Care | Payer: Medicare Other | Attending: Emergency Medicine | Admitting: Emergency Medicine

## 2019-11-08 ENCOUNTER — Emergency Department: Payer: Medicare Other

## 2019-11-08 ENCOUNTER — Encounter: Payer: Self-pay | Admitting: Emergency Medicine

## 2019-11-08 ENCOUNTER — Other Ambulatory Visit: Payer: Self-pay

## 2019-11-08 DIAGNOSIS — Z79899 Other long term (current) drug therapy: Secondary | ICD-10-CM | POA: Diagnosis not present

## 2019-11-08 DIAGNOSIS — S81811A Laceration without foreign body, right lower leg, initial encounter: Secondary | ICD-10-CM | POA: Diagnosis not present

## 2019-11-08 DIAGNOSIS — I69351 Hemiplegia and hemiparesis following cerebral infarction affecting right dominant side: Secondary | ICD-10-CM | POA: Diagnosis not present

## 2019-11-08 DIAGNOSIS — Z7902 Long term (current) use of antithrombotics/antiplatelets: Secondary | ICD-10-CM | POA: Diagnosis not present

## 2019-11-08 DIAGNOSIS — M8580 Other specified disorders of bone density and structure, unspecified site: Secondary | ICD-10-CM | POA: Diagnosis not present

## 2019-11-08 DIAGNOSIS — Y999 Unspecified external cause status: Secondary | ICD-10-CM | POA: Insufficient documentation

## 2019-11-08 DIAGNOSIS — Y929 Unspecified place or not applicable: Secondary | ICD-10-CM | POA: Insufficient documentation

## 2019-11-08 DIAGNOSIS — S9031XA Contusion of right foot, initial encounter: Secondary | ICD-10-CM | POA: Insufficient documentation

## 2019-11-08 DIAGNOSIS — X58XXXA Exposure to other specified factors, initial encounter: Secondary | ICD-10-CM | POA: Diagnosis not present

## 2019-11-08 DIAGNOSIS — R600 Localized edema: Secondary | ICD-10-CM | POA: Diagnosis not present

## 2019-11-08 DIAGNOSIS — S91311A Laceration without foreign body, right foot, initial encounter: Secondary | ICD-10-CM | POA: Insufficient documentation

## 2019-11-08 DIAGNOSIS — Y9389 Activity, other specified: Secondary | ICD-10-CM | POA: Insufficient documentation

## 2019-11-08 LAB — CBC WITH DIFFERENTIAL/PLATELET
Abs Immature Granulocytes: 0.03 10*3/uL (ref 0.00–0.07)
Basophils Absolute: 0.1 10*3/uL (ref 0.0–0.1)
Basophils Relative: 1 %
Eosinophils Absolute: 0.2 10*3/uL (ref 0.0–0.5)
Eosinophils Relative: 2 %
HCT: 33.3 % — ABNORMAL LOW (ref 36.0–46.0)
Hemoglobin: 10.2 g/dL — ABNORMAL LOW (ref 12.0–15.0)
Immature Granulocytes: 0 %
Lymphocytes Relative: 25 %
Lymphs Abs: 1.8 10*3/uL (ref 0.7–4.0)
MCH: 31 pg (ref 26.0–34.0)
MCHC: 30.6 g/dL (ref 30.0–36.0)
MCV: 101.2 fL — ABNORMAL HIGH (ref 80.0–100.0)
Monocytes Absolute: 0.7 10*3/uL (ref 0.1–1.0)
Monocytes Relative: 9 %
Neutro Abs: 4.7 10*3/uL (ref 1.7–7.7)
Neutrophils Relative %: 63 %
Platelets: 200 10*3/uL (ref 150–400)
RBC: 3.29 MIL/uL — ABNORMAL LOW (ref 3.87–5.11)
RDW: 14.8 % (ref 11.5–15.5)
WBC: 7.5 10*3/uL (ref 4.0–10.5)
nRBC: 0 % (ref 0.0–0.2)

## 2019-11-08 MED ORDER — CEFAZOLIN SODIUM-DEXTROSE 1-4 GM/50ML-% IV SOLN
1.0000 g | Freq: Once | INTRAVENOUS | Status: AC
  Administered 2019-11-09: 1 g via INTRAVENOUS
  Filled 2019-11-08: qty 50
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
## 2019-11-09 DIAGNOSIS — S91311A Laceration without foreign body, right foot, initial encounter: Secondary | ICD-10-CM | POA: Diagnosis not present

## 2019-11-09 LAB — COMPREHENSIVE METABOLIC PANEL
ALT: 12 U/L (ref 0–44)
AST: 16 U/L (ref 15–41)
Albumin: 3.4 g/dL — ABNORMAL LOW (ref 3.5–5.0)
Alkaline Phosphatase: 51 U/L (ref 38–126)
Anion gap: 10 (ref 5–15)
BUN: 50 mg/dL — ABNORMAL HIGH (ref 8–23)
CO2: 25 mmol/L (ref 22–32)
Calcium: 9.6 mg/dL (ref 8.9–10.3)
Chloride: 107 mmol/L (ref 98–111)
Creatinine, Ser: 3.44 mg/dL — ABNORMAL HIGH (ref 0.44–1.00)
GFR calc Af Amer: 13 mL/min — ABNORMAL LOW (ref >60)
GFR calc non Af Amer: 11 mL/min — ABNORMAL LOW (ref >60)
Glucose, Bld: 130 mg/dL — ABNORMAL HIGH (ref 70–99)
Potassium: 4.4 mmol/L (ref 3.5–5.1)
Sodium: 142 mmol/L (ref 135–145)
Total Bilirubin: 0.5 mg/dL (ref 0.3–1.2)
Total Protein: 6.7 g/dL (ref 6.5–8.1)

## 2019-11-09 LAB — LACTIC ACID, PLASMA: Lactic Acid, Venous: 1.5 mmol/L (ref 0.5–1.9)

## 2019-11-09 MED ORDER — CEPHALEXIN 500 MG PO CAPS: 500.0000 mg | ORAL_CAPSULE | Freq: Three times a day (TID) | ORAL | 0 refills | Status: AC

## 2019-11-09 NOTE — ED Notes (Signed)
1st set of cultures sent to lab by Raquel RN

## 2019-11-09 NOTE — ED Provider Notes (Signed)
[facility] Emergency Department Provider Note  ____________________________________________  Time seen: Approximately 12:00 AM  I have reviewed the triage vital signs and the nursing notes.   HISTORY  Chief Complaint Foot Injury  Level 5 caveat:  Portions of the history and physical were unable to be obtained due to nonverbal and dementia   HPI [name] L Goggins is a 86 y.o. female history of dementia, stroke with right-sided hemiparesis, nonverbal who presents with her daughter for right foot injury.  According to the daughter patient still ambulates with a walker and assistance.  2 days ago patient was being assisted out of her chair when she sustained a cut on the dorsum of the right foot.  The daughter has been putting topical antibiotics and keeping it dry and clean.  Patient is nonverbal but the daughter said if she was in pain she was able to communicate.  She did communicate any pain.  She continues to walk on the foot.  She has had no fever, normal appetite with no nausea or vomiting.   Past Medical History:  Diagnosis Date  . Allergy   . Alzheimer's disease ([place])   . Anemia   . CCF (congestive cardiac failure) ([place])   . Chronic kidney disease   . Chronic stable angina ([place])   . Frequent falls   . Gout   . H/O: GI bleed   . Hypercholesteremia   . Hyperglycemia   . Hyperlipidemia   . Hypertension   . Incontinence   . Mixed dementia ([place])   . Myocardial infarction ([place])   . Numbness of right hand   . Osteoporosis   . Right hand weakness   . Stroke ([place])   . Thyroid disease   . Vitamin D deficiency     Patient Active Problem List   Diagnosis Date Noted  . GI bleed 10/15/2018  . UTI (urinary tract infection) 10/15/2018  . Osteoarthritis 11/19/2017  . Altered mental status 02/26/2017  . Dysarthria due to recent cerebrovascular accident (CVA) 06/23/2016  . Hemiparesis affecting right side as late effect of cerebrovascular accident  (CVA) ([place]) 06/23/2016  . Hypotension 06/23/2016  . CKD (chronic kidney disease) stage 5, GFR less than 15 ml/min (HCC) 06/23/2016  . Anemia of chronic disease 06/23/2016  . Encephalopathy, metabolic 18/56/3149  . Intermittent confusion 06/21/2016  . Senile purpura ([place]) 05/13/2016  . Hiatal hernia 05/04/2016  . Thoracic aorta atherosclerosis ([place]) 05/04/2016  . Chronic stable angina ([place]) 10/16/2015  . Secondary hyperparathyroidism ([place]) 04/13/2015  . Arteriosclerosis of coronary artery 04/08/2015  . Benign hypertension 04/08/2015  . Controlled gout 04/08/2015  . History of peptic ulcer disease 04/08/2015  . Adult hypothyroidism 04/08/2015  . Mild cognitive disorder 04/08/2015  . Anemia of chronic disease 04/08/2015  . Osteoarthrosis involving more than one site but not generalized 04/08/2015  . Osteoporosis 04/08/2015  . Pelvic muscle wasting 04/08/2015  . Allergic rhinitis 04/08/2015  . Tobacco use 04/08/2015  . Chronic diastolic CHF (congestive heart failure) ([place]) 03/29/2014  . HLD (hyperlipidemia) 03/29/2014  . H/O gastrointestinal hemorrhage 04/05/2007    Past Surgical History:  Procedure Laterality Date  . EXPLORATORY LAPAROTOMY    . EYE SURGERY Bilateral    cataract    Prior to Admission medications   Medication Sig Start Date End Date Taking? Authorizing Provider  allopurinol (ZYLOPRIM) 100 MG tablet TAKE 1 TABLET BY MOUTH EVERY DAY 08/04/19   Ancil Boozer, Drue Stager, MD  calcitRIOL (ROCALTROL) 0.25 MCG capsule Take 0.25 mcg by  mouth daily. 09/05/19   [provider]  cephALEXin (KEFLEX) 500 MG capsule Take 1 capsule (500 mg total) by mouth 3 (three) times daily for 7 days. 11/09/19 11/16/19  Rudene Re, MD  clopidogrel (PLAVIX) 75 MG tablet Take 1 tablet (75 mg total) by mouth daily. 10/17/19   Steele Sizer, MD  dexlansoprazole (DEXILANT) 60 MG capsule Take by mouth. 09/02/12   [provider]  ferrous sulfate 325 (65 FE) MG tablet Take 325 mg by  mouth 2 (two) times daily with a meal.    [provider]  furosemide (LASIX) 40 MG tablet Take 1 tablet (40 mg total) by mouth every other day. 10/17/18 10/17/19  Gladstone Lighter, MD  isosorbide mononitrate (IMDUR) 60 MG 24 hr tablet TAKE 1 TABLET BY MOUTH EVERY DAY 06/19/19   Steele Sizer, MD  levothyroxine (SYNTHROID) 50 MCG tablet Take 1 tablet (50 mcg total) by mouth daily at 6 (six) AM. 10/17/19   Ancil Boozer, Drue Stager, MD  pantoprazole (PROTONIX) 40 MG tablet Take 1 tablet (40 mg total) by mouth daily. 10/17/19   Steele Sizer, MD  raloxifene (EVISTA) 60 MG tablet Take 1 tablet (60 mg total) by mouth daily. 10/17/19   Steele Sizer, MD  rosuvastatin (CRESTOR) 10 MG tablet Take 1 tablet (10 mg total) by mouth daily. 10/17/19   Steele Sizer, MD  sodium bicarbonate 650 MG tablet Take 650 mg by mouth daily.  03/19/15   [provider]    Allergies Patient has no known allergies.  Family History  Problem Relation Age of Onset  . Diabetes Sister   . Hypertension Sister   . Hypertension Brother   . Heart disease Brother     Social History Social History   Tobacco Use  . Smoking status: Never Smoker  . Smokeless tobacco: Former Systems developer    Types: Snuff  . Tobacco comment: smoking cessation materials not requuired  Substance Use Topics  . Alcohol use: No  . Drug use: No    Review of Systems  Constitutional: Negative for fever. Respiratory: Negative for shortness of breath. Gastrointestinal: Negative for vomiting or diarrhea. Musculoskeletal: + R foot wound  ____________________________________________   PHYSICAL EXAM:  VITAL SIGNS: ED Triage Vitals  Enc Vitals Group     BP 11/08/19 2329 (!) 156/83     Pulse Rate 11/08/19 2329 96     Resp 11/08/19 2329 20     Temp 11/08/19 2329 98.9 F (37.2 C)     Temp Source 11/08/19 2329 Oral     SpO2 11/08/19 2329 99 %     Weight 11/08/19 2259 128 lb (58.1 kg)     Height 11/08/19 2259 5\' 2"  (1.575 m)     Head  Circumference --      Peak Flow --      Pain Score --      Pain Loc --      Pain Edu? --      Excl. in [place]? --     Constitutional: Alert, smiling, waving hello, in no distress. HEENT:      Head: Normocephalic and atraumatic.         Eyes: Conjunctivae are normal. Sclera is non-icteric.       Mouth/Throat: Mucous membranes are moist.       Neck: Supple with no signs of meningismus. Cardiovascular: Regular rate and rhythm.  Respiratory: Normal respiratory effort.  Musculoskeletal: There is a circular skin tear located on the dorsum of the R foot with surrounding edema  and bruising, not tender to the touch, no purulent discharge, no warmth, no crepitus, no bullae Neurologic: R sided hemiparesis - stable and chronic Skin: Skin is warm, dry and intact. No rash noted. Psychiatric: Mood and affect are normal. behavior are normal.  ____________________________________________   LABS (all labs ordered are listed, but only abnormal results are displayed)  Labs Reviewed  CBC WITH DIFFERENTIAL/PLATELET - Abnormal; Notable for the following components:      Result Value   RBC 3.29 (*)    Hemoglobin 10.2 (*)    HCT 33.3 (*)    MCV 101.2 (*)    All other components within normal limits  COMPREHENSIVE METABOLIC PANEL - Abnormal; Notable for the following components:   Glucose, Bld 130 (*)    BUN 50 (*)    Creatinine, Ser 3.44 (*)    Albumin 3.4 (*)    GFR calc non Af Amer 11 (*)    GFR calc Af Amer 13 (*)    All other components within normal limits  CULTURE, BLOOD (ROUTINE X 2)  CULTURE, BLOOD (ROUTINE X 2)  LACTIC ACID, PLASMA  LACTIC ACID, PLASMA   ____________________________________________  EKG  none  ____________________________________________  RADIOLOGY  I have personally reviewed the images performed during this visit and I agree with the Radiologist's read.   Interpretation by Radiologist:  DG Foot Complete Right  Result Date: 11/08/2019 CLINICAL DATA:   86 year old female with trauma to the right foot. EXAM: RIGHT FOOT COMPLETE - 3+ VIEW COMPARISON:  Right foot radiograph dated 10/15/2018. FINDINGS: There is no acute fracture or dislocation. The bones are osteopenic. No bone erosion or periosteal elevation. There is diffuse soft tissue edema of the dorsum of the foot. No radiopaque foreign object or soft tissue gas. IMPRESSION: 1. No acute fracture or dislocation. 2. Diffuse soft tissue edema of the foot. Clinical correlation is recommended. Electronically Signed   By: Anner Crete M.D.   On: 11/08/2019 23:16     ____________________________________________   PROCEDURES  Procedure(s) performed: None Procedures Critical Care performed:  None ____________________________________________   INITIAL IMPRESSION / ASSESSMENT AND PLAN / ED COURSE   86 y.o. female history of dementia, stroke with right-sided hemiparesis, nonverbal who presents with her daughter for right foot injury.  Patient is nonverbal but continues to walk on the foot and is not complaining of any pain.  She has had no fever, nausea or vomiting.  She is not a diabetic.  She does have a circular skin tear on the dorsum of her right foot with bruising and swelling around it but no warmth, no purulent discharge, no fluctuance, no crepitus, no bulla.  Patient has distal brisk capillary refill, extremities warm and well-perfused otherwise.  Here she is afebrile, she has no tenderness to palpation around the injury.  She has no white count, her labs are within patient's baseline with no significant changes.  Lactic acid is normal at 1.5.  X-ray showing no evidence of osteomyelitis or subcutaneous air. Wound was cleaned and dressed in the ED with xeroform and dry clean dressing. Ancef given for prophylaxis and will place patient on keflex. Discussed outpatient vs inpatient wound care with daughter who is patient's HCPOA and she prefers that patient goes home and see wound clinic as  outpatient due to [place] pandemic. I do believe this is a reasonable option as the wound does not look infected at this time.  I discussed wound care at home and close follow-up with the wound clinic.  Discussed signs of infection including pus, warmth, pain, fever, nausea or vomiting and recommended return to the emergency room if these develop.  Otherwise Tylenol for pain.      _____________________________________________ Please note:  Patient was evaluated in Emergency Department today for the symptoms described in the history of present illness. Patient was evaluated in the context of the global COVID-19 pandemic, which necessitated consideration that the patient might be at risk for infection with the SARS-CoV-2 virus that causes COVID-19. Institutional protocols and algorithms that pertain to the evaluation of patients at risk for COVID-19 are in a state of rapid change based on information released by regulatory bodies including the CDC and federal and state organizations. These policies and algorithms were followed during the patient's care in the ED.  Some ED evaluations and interventions may be delayed as a result of limited staffing during the pandemic.   ____________________________________________   FINAL CLINICAL IMPRESSION(S) / ED DIAGNOSES   Final diagnoses:  Contusion of right foot, initial encounter  Noninfected skin tear of right lower extremity, initial encounter      NEW MEDICATIONS STARTED DURING THIS VISIT:  ED Discharge Orders         Ordered    cephALEXin (KEFLEX) 500 MG capsule  3 times daily     11/09/19 0057           Note:  This document was prepared using Dragon voice recognition software and may include unintentional dictation errors.    Alfred Levins, [place], MD 11/09/19 0100

## 2019-11-09 NOTE — Discharge Instructions (Signed)
Keep the wound dry and clean. It is okay to put topical bacitracin on it. It is okay to wash with warm water and soap. Keep it wrapped to prevent any further injuries to it. Call the wound center this morning for close follow-up. Give her the antibiotics 3 times a day as prescribed. Give her Tylenol 1000 mg 3 times a day for pain. Return to the emergency room if you notice pus, if the area around the wound or the foot feels hot to the touch, if she has pain, if you notice blisters, if you notice worsening swelling, fever, nausea or vomiting.

## 2019-11-11 DIAGNOSIS — Z23 Encounter for immunization: Secondary | ICD-10-CM | POA: Diagnosis not present

## 2019-11-14 ENCOUNTER — Ambulatory Visit: Payer: Self-pay

## 2019-11-14 LAB — CULTURE, BLOOD (ROUTINE X 2)
Culture: NO GROWTH
Special Requests: ADEQUATE

## 2019-11-22 ENCOUNTER — Ambulatory Visit (INDEPENDENT_AMBULATORY_CARE_PROVIDER_SITE_OTHER): Payer: Medicare Other

## 2019-11-22 DIAGNOSIS — Z7189 Other specified counseling: Secondary | ICD-10-CM | POA: Diagnosis not present

## 2019-11-22 DIAGNOSIS — Z0000 Encounter for general adult medical examination without abnormal findings: Secondary | ICD-10-CM | POA: Diagnosis not present

## 2019-11-22 NOTE — Progress Notes (Signed)
Subjective:   Crystal Faulkner is a 86 y.o. female who presents for Medicare Annual (Subsequent) preventive examination.   Virtual Visit via Telephone Note  I connected with Crystal Faulkner on 11/22/19 at 11:20 AM EDT by telephone and verified that I am speaking with the correct person using two identifiers.  Medicare Annual Wellness visit completed telephonically due to Covid-19 pandemic.   Location: Patient: home Provider: office   I discussed the limitations, risks, security and privacy concerns of performing an evaluation and management service by telephone and the availability of in person appointments. The patient expressed understanding and agreed to proceed.  Some vital signs may be absent or patient reported.   Crystal Marker, [place]    Review of Systems:   Cardiac Risk Factors include: advanced age (>86men, >75 women);hypertension;dyslipidemia;sedentary lifestyle     Objective:     Vitals: There were no vitals taken for this visit.  There is no height or weight on file to calculate BMI.  Advanced Directives 11/22/2019 11/08/2019 08/26/2019 10/16/2018 10/15/2018 11/27/2017 11/04/2017  Does Patient Have a Medical Advance Directive? Yes Yes No Yes Yes Yes Yes  Type of Paramedic of [place];Living will Healthcare Power of [place];Living will [place];Living will  Does patient want to make changes to medical advance directive? - No - Guardian declined - No - Patient declined - - -  Copy of [place] in Chart? No - copy requested No - copy requested - Yes - validated most recent copy scanned in chart (See row information) - Yes No - copy requested  Would patient like information on creating a medical advance directive? - - - - - - -    Tobacco Social History   Tobacco Use  Smoking Status Never Smoker  Smokeless Tobacco Former  Systems developer  . Types: Snuff  Tobacco Comment   smoking cessation materials not requuired     Counseling given: Not Answered Comment: smoking cessation materials not requuired   Clinical Intake:  Pre-visit preparation completed: Yes  Pain : No/denies pain     Nutritional Risks: None Diabetes: No  How often do you need to have someone help you when you read instructions, pamphlets, or other written materials from your doctor or pharmacy?: 1 - Never  Interpreter Needed?: No  Information entered by :: Crystal Faulkner  Past Medical History:  Diagnosis Date  . Allergy   . Alzheimer's disease ([place])   . Anemia   . CCF (congestive cardiac failure) ([place])   . Chronic kidney disease   . Chronic stable angina ([place])   . Frequent falls   . Gout   . H/O: GI bleed   . Hypercholesteremia   . Hyperglycemia   . Hyperlipidemia   . Hypertension   . Incontinence   . Mixed dementia ([place])   . Myocardial infarction ([place])   . Numbness of right hand   . Osteoporosis   . Right hand weakness   . Stroke ([place])   . Thyroid disease   . Vitamin D deficiency    Past Surgical History:  Procedure Laterality Date  . EXPLORATORY LAPAROTOMY    . EYE SURGERY Bilateral    cataract   Family History  Problem Relation Age of Onset  . Diabetes Sister   . Hypertension Sister   . Hypertension Brother   . Heart disease Brother    Social History  Socioeconomic History  . Marital status: Widowed    Spouse name: Not on file  . Number of children: 6  . Years of education: Not on file  . Highest education level: 8th grade  Occupational History  . Occupation: Retired  Tobacco Use  . Smoking status: Never Smoker  . Smokeless tobacco: Former Systems developer    Types: Snuff  . Tobacco comment: smoking cessation materials not requuired  Substance and Sexual Activity  . Alcohol use: No  . Drug use: No  . Sexual activity: Not Currently  Other Topics Concern  . Not on file  Social History Narrative   She lives  with her daughter.    She also has her nephew and niece at home.    Had a stroke October 2017 and has difficulty walking and also unable to talk since.        Social Determinants of Health   Financial Resource Strain: Low Risk   . Difficulty of Paying Living Expenses: Not hard at all  Food Insecurity: No Food Insecurity  . Worried About Charity fundraiser in the Last Year: Never true  . Ran Out of Food in the Last Year: Never true  Transportation Needs: No Transportation Needs  . Lack of Transportation (Medical): No  . Lack of Transportation (Non-Medical): No  Physical Activity: Inactive  . Days of Exercise per Week: 0 days  . Minutes of Exercise per Session: 0 min  Stress: No Stress Concern Present  . Feeling of Stress : Not at all  Social Connections: Severely Isolated  . Frequency of Communication with Friends and Family: Never  . Frequency of Social Gatherings with Friends and Family: Twice a week  . Attends Religious Services: Never  . Active Member of Clubs or Organizations: No  . Attends Archivist Meetings: Never  . Marital Status: Widowed    Outpatient Encounter Medications as of 11/22/2019  Medication Sig  . allopurinol (ZYLOPRIM) 100 MG tablet TAKE 1 TABLET BY MOUTH EVERY DAY  . calcitRIOL (ROCALTROL) 0.25 MCG capsule Take 0.25 mcg by mouth daily.  . clopidogrel (PLAVIX) 75 MG tablet Take 1 tablet (75 mg total) by mouth daily.  Marland Kitchen dexlansoprazole (DEXILANT) 60 MG capsule Take by mouth.  . ferrous sulfate 325 (65 FE) MG tablet Take 325 mg by mouth 2 (two) times daily with a meal.  . isosorbide mononitrate (IMDUR) 60 MG 24 hr tablet TAKE 1 TABLET BY MOUTH EVERY DAY  . levothyroxine (SYNTHROID) 50 MCG tablet Take 1 tablet (50 mcg total) by mouth daily at 6 (six) AM.  . pantoprazole (PROTONIX) 40 MG tablet Take 1 tablet (40 mg total) by mouth daily.  . raloxifene (EVISTA) 60 MG tablet Take 1 tablet (60 mg total) by mouth daily.  . rosuvastatin (CRESTOR) 10 MG  tablet Take 1 tablet (10 mg total) by mouth daily.  . sodium bicarbonate 650 MG tablet Take 650 mg by mouth daily.   . furosemide (LASIX) 40 MG tablet Take 1 tablet (40 mg total) by mouth every other day.   No facility-administered encounter medications on file as of 11/22/2019.    Activities of Daily Living In your present state of health, do you have any difficulty performing the following activities: 11/22/2019 10/17/2019  Hearing? N N  Comment declines hearing aids -  Vision? N Y  Difficulty concentrating or making decisions? Y Y  Comment - Dementia  Walking or climbing stairs? Y Y  Comment - Walks with a walker  Dressing  or bathing? Y Y  Comment - Daughter helps  Doing errands, shopping? Tempie Donning  Preparing Food and eating ? Y -  Using the Toilet? Y -  Comment needs assistance -  In the past six months, have you accidently leaked urine? Y -  Comment wears depends -  Do you have problems with loss of bowel control? Y -  Managing your Medications? Y -  Managing your Finances? Y -  Housekeeping or managing your Housekeeping? Y -  Some recent data might be hidden    Patient Care Team: Steele Sizer, MD as PCP - General (Family Medicine) Steele Sizer, MD (Family Medicine) Isaias Cowman, MD as Consulting Physician (Cardiology) Vladimir Crofts, MD as Consulting Physician (Neurology) Justin Mend, MD as Attending Physician (Nephrology)    Assessment:   This is a routine wellness examination for [name].  Exercise Activities and Dietary recommendations Current Exercise Habits: The patient does not participate in regular exercise at present, Exercise limited by: neurologic condition(s);orthopedic condition(s)  Goals    . Prevent falls     Recommend to remove any items from the home that may cause slips or trips.       Fall Risk Fall Risk  11/22/2019 10/17/2019 04/11/2019 01/07/2019 10/25/2018  Falls in the past year? 0 0 0 0 0  Comment - - - - -  Number falls in  past yr: 0 0 0 0 0  Injury with Fall? 0 0 0 0 0  Comment - - - - -  Risk Factor Category  - - - - -  Risk for fall due to : Impaired balance/gait;Impaired mobility - - - -  Risk for fall due to: Comment - - - - -  Follow up Falls prevention discussed - - - -   FALL RISK PREVENTION PERTAINING TO THE HOME:  Any stairs in or around the home? Yes  If so, do they handrails? Yes   Home free of loose throw rugs in walkways, pet beds, electrical cords, etc? Yes  Adequate lighting in your home to reduce risk of falls? Yes   ASSISTIVE DEVICES UTILIZED TO PREVENT FALLS:  Life alert? No  Use of a cane, walker or w/c? Yes  Grab bars in the bathroom? No  Shower chair or bench in shower? Yes  Elevated toilet seat or a handicapped toilet? No   DME ORDERS:  DME order needed?  No   TIMED UP AND GO:  Was the test performed? No . Telephonic visit.   Education: Fall risk prevention has been discussed.  Intervention(s) required? No   Depression Screen PHQ 2/9 Scores 11/22/2019 10/17/2019 04/11/2019 01/07/2019  PHQ - 2 Score 0 0 0 0  PHQ- 9 Score - 0 0 0     Cognitive Function unable to assess due to patient has difficulty with speech        Immunization History  Administered Date(s) Administered  . Fluad Quad(high Dose 65+) 06/02/2019  . Influenza, High Dose Seasonal PF 05/13/2016, 06/15/2017, 06/08/2018  . Influenza, Seasonal, Injecte, Preservative Fre 05/01/2010, 05/30/2011, 05/17/2012  . Influenza,inj,Quad PF,6+ Mos 05/26/2013, 06/12/2014, 05/11/2015  . Influenza-Unspecified 06/12/2014  . Janssen (J&J) SARS-COV-2 Vaccination 11/11/2019  . Pneumococcal Conjugate-13 05/11/2015  . Pneumococcal Polysaccharide-23 06/22/2006  . Tdap 10/16/2015, 10/15/2018  . Zoster 04/13/2015    Qualifies for Shingles Vaccine?Yes  Zostavax completed 2016. Due for Shingrix. Education has been provided regarding the importance of this vaccine. Pt has been advised to call insurance company to determine  out of pocket expense. Advised may also receive vaccine at local pharmacy or Health Dept. Verbalized acceptance and understanding.  Tdap: Up to date  Flu Vaccine: Up to date  Pneumococcal Vaccine: Up to date   Screening Tests Health Maintenance  Topic Date Due  . TETANUS/TDAP  10/15/2028  . INFLUENZA VACCINE  Completed  . DEXA SCAN  Completed  . PNA vac Low Risk Adult  Completed    Cancer Screenings:  Colorectal Screening: No longer required.   Mammogram:No longer required.  Bone Density: no longer required   Lung Cancer Screening: (Low Dose CT Chest recommended if Age 2-80 years, 30 pack-year currently smoking OR have quit w/in 15years.) does not qualify.   Additional Screening:  Hepatitis C Screening: no longer required  Vision Screening: Recommended annual ophthalmology exams for early detection of glaucoma and other disorders of the eye. Is the patient up to date with their annual eye exam?  No  Who is the provider or what is the name of the office in which the pt attends annual eye exams? Dr. Gwynn Burly  Dental Screening: Recommended annual dental exams for proper oral hygiene  Community Resource Referral:  CRR required this visit?  Yes  needs ramp to prevent falls     Plan:     I have personally reviewed and addressed the Medicare Annual Wellness questionnaire and have noted the following in the patient's chart:  A. Medical and social history B. Use of alcohol, tobacco or illicit drugs  C. Current medications and supplements D. Functional ability and status E.  Nutritional status F.  Physical activity G. Advance directives H. List of other physicians I.  Hospitalizations, surgeries, and ER visits in previous 12 months J.  [place] such as hearing and vision if needed, cognitive and depression L. Referrals and appointments   In addition, I have reviewed and discussed with patient certain preventive protocols, quality metrics, and best  practice recommendations. A written personalized care plan for preventive services as well as general preventive health recommendations were provided to patient.   Signed,  Crystal Marker, [place] Nurse Health Advisor   Nurse Notes: spoke to patient's daughter [name] to assist with wellness visit. She reports patient is doing well and foot has healed since recent ER visit.

## 2019-11-22 NOTE — Patient Instructions (Signed)
Crystal Faulkner , Thank you for taking time to come for your Medicare Wellness Visit. I appreciate your ongoing commitment to your health goals. Please review the following plan we discussed and let me know if I can assist you in the future.   Screening recommendations/referrals: Colonoscopy: no longer required Mammogram: no longer required Bone Density: no longer required Recommended yearly ophthalmology/optometry visit for glaucoma screening and checkup Recommended yearly dental visit for hygiene and checkup  Vaccinations: Influenza vaccine: done 06/02/19 Pneumococcal vaccine: done 05/11/15 Tdap vaccine: done 10/15/18 Shingles vaccine: Shingrix discussed. Please contact your pharmacy for coverage information.  Covid-19: done 11/11/19  Advanced directives: Please bring a copy of your health care power of attorney and living will to the office at your convenience.  Conditions/risks identified: recommend continuing to prevent falls   Next appointment: Please follow up in one year for your Medicare Annual Wellness visit.     Preventive Care 57 Years and Older, Female Preventive care refers to lifestyle choices and visits with your health care provider that can promote health and wellness. What does preventive care include?  A yearly physical exam. This is also called an annual well check.  Dental exams once or twice a year.  Routine eye exams. Ask your health care provider how often you should have your eyes checked.  Personal lifestyle choices, including:  Daily care of your teeth and gums.  Regular physical activity.  Eating a healthy diet.  Avoiding tobacco and drug use.  Limiting alcohol use.  Practicing safe sex.  Taking low-dose aspirin every day.  Taking vitamin and mineral supplements as recommended by your health care provider. What happens during an annual well check? The services and screenings done by your health care provider during your annual well check will  depend on your age, overall health, lifestyle risk factors, and family history of disease. Counseling  Your health care provider may ask you questions about your:  Alcohol use.  Tobacco use.  Drug use.  Emotional well-being.  Home and relationship well-being.  Sexual activity.  Eating habits.  History of falls.  Memory and ability to understand (cognition).  Work and work Statistician.  Reproductive health. Screening  You may have the following tests or measurements:  Height, weight, and BMI.  Blood pressure.  Lipid and cholesterol levels. These may be checked every 5 years, or more frequently if you are over 84 years old.  Skin check.  Lung cancer screening. You may have this screening every year starting at age 84 if you have a 30-pack-year history of smoking and currently smoke or have quit within the past 15 years.  Fecal occult blood test (FOBT) of the stool. You may have this test every year starting at age 84.  Flexible sigmoidoscopy or colonoscopy. You may have a sigmoidoscopy every 5 years or a colonoscopy every 10 years starting at age 84.  Hepatitis C blood test.  Hepatitis B blood test.  Sexually transmitted disease (STD) testing.  Diabetes screening. This is done by checking your blood sugar (glucose) after you have not eaten for a while (fasting). You may have this done every 1-3 years.  Bone density scan. This is done to screen for osteoporosis. You may have this done starting at age 52.  Mammogram. This may be done every 1-2 years. Talk to your health care provider about how often you should have regular mammograms. Talk with your health care provider about your test results, treatment options, and if necessary, the need for more  tests. Vaccines  Your health care provider may recommend certain vaccines, such as:  Influenza vaccine. This is recommended every year.  Tetanus, diphtheria, and acellular pertussis (Tdap, Td) vaccine. You may need a  Td booster every 10 years.  Zoster vaccine. You may need this after age 20.  Pneumococcal 13-valent conjugate (PCV13) vaccine. One dose is recommended after age 13.  Pneumococcal polysaccharide (PPSV23) vaccine. One dose is recommended after age 72. Talk to your health care provider about which screenings and vaccines you need and how often you need them. This information is not intended to replace advice given to you by your health care provider. Make sure you discuss any questions you have with your health care provider. Document Released: 09/07/2015 Document Revised: 04/30/2016 Document Reviewed: 06/12/2015 Elsevier Interactive Patient Education  2017 [place] Prevention in the Home Falls can cause injuries. They can happen to people of all ages. There are many things you can do to make your home safe and to help prevent falls. What can I do on the outside of my home?  Regularly fix the edges of walkways and driveways and fix any cracks.  Remove anything that might make you trip as you walk through a door, such as a raised step or threshold.  Trim any bushes or trees on the path to your home.  Use bright outdoor lighting.  Clear any walking paths of anything that might make someone trip, such as rocks or tools.  Regularly check to see if handrails are loose or broken. Make sure that both sides of any steps have handrails.  Any raised decks and porches should have guardrails on the edges.  Have any leaves, snow, or ice cleared regularly.  Use sand or salt on walking paths during winter.  Clean up any spills in your garage right away. This includes oil or grease spills. What can I do in the bathroom?  Use night lights.  Install grab bars by the toilet and in the tub and shower. Do not use towel bars as grab bars.  Use non-skid mats or decals in the tub or shower.  If you need to sit down in the shower, use a plastic, non-slip stool.  Keep the floor dry. Clean  up any water that spills on the floor as soon as it happens.  Remove soap buildup in the tub or shower regularly.  Attach bath mats securely with double-sided non-slip rug tape.  Do not have throw rugs and other things on the floor that can make you trip. What can I do in the bedroom?  Use night lights.  Make sure that you have a light by your bed that is easy to reach.  Do not use any sheets or blankets that are too big for your bed. They should not hang down onto the floor.  Have a firm chair that has side arms. You can use this for support while you get dressed.  Do not have throw rugs and other things on the floor that can make you trip. What can I do in the kitchen?  Clean up any spills right away.  Avoid walking on wet floors.  Keep items that you use a lot in easy-to-reach places.  If you need to reach something above you, use a strong step stool that has a grab bar.  Keep electrical cords out of the way.  Do not use floor polish or wax that makes floors slippery. If you must use wax, use non-skid floor  wax.  Do not have throw rugs and other things on the floor that can make you trip. What can I do with my stairs?  Do not leave any items on the stairs.  Make sure that there are handrails on both sides of the stairs and use them. Fix handrails that are broken or loose. Make sure that handrails are as long as the stairways.  Check any carpeting to make sure that it is firmly attached to the stairs. Fix any carpet that is loose or worn.  Avoid having throw rugs at the top or bottom of the stairs. If you do have throw rugs, attach them to the floor with carpet tape.  Make sure that you have a light switch at the top of the stairs and the bottom of the stairs. If you do not have them, ask someone to add them for you. What else can I do to help prevent falls?  Wear shoes that:  Do not have high heels.  Have rubber bottoms.  Are comfortable and fit you well.  Are  closed at the toe. Do not wear sandals.  If you use a stepladder:  Make sure that it is fully opened. Do not climb a closed stepladder.  Make sure that both sides of the stepladder are locked into place.  Ask someone to hold it for you, if possible.  Clearly mark and make sure that you can see:  Any grab bars or handrails.  First and last steps.  Where the edge of each step is.  Use tools that help you move around (mobility aids) if they are needed. These include:  Canes.  Walkers.  Scooters.  Crutches.  Turn on the lights when you go into a dark area. Replace any light bulbs as soon as they burn out.  Set up your furniture so you have a clear path. Avoid moving your furniture around.  If any of your floors are uneven, fix them.  If there are any pets around you, be aware of where they are.  Review your medicines with your doctor. Some medicines can make you feel dizzy. This can increase your chance of falling. Ask your doctor what other things that you can do to help prevent falls. This information is not intended to replace advice given to you by your health care provider. Make sure you discuss any questions you have with your health care provider. Document Released: 06/07/2009 Document Revised: 01/17/2016 Document Reviewed: 09/15/2014 Elsevier Interactive Patient Education  2017 [employer].

## 2019-11-23 ENCOUNTER — Telehealth: Payer: Self-pay

## 2019-11-23 NOTE — Telephone Encounter (Signed)
Copied from [place] [id number]. Topic: Referral - Status >> Nov 23, 2019  1:15 PM Simone Curia D wrote: 03/19/2034 Left message on voicemail for patient's daughter Aayliah Rotenberry to return my call regarding wheelchair ramp for patient. Ambrose Mantle [phone]

## 2019-12-05 ENCOUNTER — Telehealth: Payer: Self-pay

## 2019-12-05 NOTE — Telephone Encounter (Signed)
Copied from [place] [id number]. Topic: Referral - Status >> Dec 05, 2019 31:51 PM Simone Curia D wrote: 7/61/60 Spoke with patient's daughter Raziya Aveni, she has not received a call from Hollice Gong at [facility].  Sent [place] n email for a status update.  Will contact [name] when I receive a response from [place].  Ambrose Mantle [phone]

## 2019-12-09 ENCOUNTER — Telehealth: Payer: Self-pay

## 2019-12-09 NOTE — Telephone Encounter (Signed)
Copied from [place] [id number]. Topic: Referral - Status >> Dec 09, 2019 48:35 PM Simone Curia D wrote: 0/75/73 Spoke with patient's daughter, she received a call from Hollice Gong at [facility]. Sharyn Lull is sending an application to be signed and returned to her. She will keep patient's daughter updated throughout the process.  Patient does not have any further needs. Closing referral. Ambrose Mantle [phone]

## 2019-12-21 ENCOUNTER — Other Ambulatory Visit: Payer: Self-pay | Admitting: Family Medicine

## 2019-12-21 DIAGNOSIS — I208 Other forms of angina pectoris: Secondary | ICD-10-CM

## 2019-12-21 DIAGNOSIS — I2089 Other forms of angina pectoris: Secondary | ICD-10-CM

## 2019-12-22 ENCOUNTER — Other Ambulatory Visit: Payer: Self-pay | Admitting: Family Medicine

## 2019-12-22 NOTE — Telephone Encounter (Signed)
Requested medication (s) are due for refill today- yes  Requested medication (s) are on the active medication list -yes  Future visit scheduled -no  Last refill: Rx written 10/17/18 11 RF- expired Rx  Notes to clinic: Request for RF of medication written by outside provider- ED  Requested Prescriptions  Pending Prescriptions Disp Refills   furosemide (LASIX) 40 MG tablet 15 tablet 11    Sig: Take 1 tablet (40 mg total) by mouth every other day.      Cardiovascular:  Diuretics - Loop Failed - 12/22/2019 11:52 AM      Failed - Cr in normal range and within 360 days    Creat  Date Value Ref Range Status  06/02/2019 2.94 (H) 0.60 - 0.88 mg/dL Final    Comment:    For patients >63 years of age, the reference limit for Creatinine is approximately 13% higher for people identified as African-American. .    Creatinine, Ser  Date Value Ref Range Status  11/08/2019 3.44 (H) 0.44 - 1.00 mg/dL Final   Creatinine, Urine  Date Value Ref Range Status  06/02/2019 56 20 - 275 mg/dL Final          Failed - Last BP in normal range    BP Readings from Last 1 Encounters:  11/09/19 (!) 163/82          Passed - K in normal range and within 360 days    Potassium  Date Value Ref Range Status  11/08/2019 4.4 3.5 - 5.1 mmol/L Final  07/08/2012 4.0 3.5 - 5.1 mmol/L Final          Passed - Ca in normal range and within 360 days    Calcium  Date Value Ref Range Status  11/08/2019 9.6 8.9 - 10.3 mg/dL Final   Calcium, Total  Date Value Ref Range Status  07/08/2012 8.8 8.5 - 10.1 mg/dL Final          Passed - Na in normal range and within 360 days    Sodium  Date Value Ref Range Status  11/08/2019 142 135 - 145 mmol/L Final  10/16/2015 140 134 - 144 mmol/L Final  07/08/2012 139 136 - 145 mmol/L Final          Passed - Valid encounter within last 6 months    Recent Outpatient Visits           2 months ago Dyslipidemia   [facility] [place], Drue Stager, MD   8  months ago Hemiparesis affecting right side as late effect of cerebrovascular accident (CVA) [facility])   [facility] Steele Sizer, MD   11 months ago Arteriosclerosis of coronary artery   [facility] [place], Drue Stager, MD   1 year ago Sepsis due to Escherichia coli with acute renal failure without septic shock, unspecified acute renal failure type [facility])   [place], Astrid Divine, FNP   1 year ago Hemiparesis affecting right side as late effect of cerebrovascular accident (CVA) [facility])   [facility] [place], Drue Stager, MD                  Requested Prescriptions  Pending Prescriptions Disp Refills   furosemide (LASIX) 40 MG tablet 15 tablet 11    Sig: Take 1 tablet (40 mg total) by mouth every other day.      Cardiovascular:  Diuretics - Loop Failed - 12/22/2019 11:52 AM      Failed -  Cr in normal range and within 360 days    Creat  Date Value Ref Range Status  06/02/2019 2.94 (H) 0.60 - 0.88 mg/dL Final    Comment:    For patients >44 years of age, the reference limit for Creatinine is approximately 13% higher for people identified as African-American. .    Creatinine, Ser  Date Value Ref Range Status  11/08/2019 3.44 (H) 0.44 - 1.00 mg/dL Final   Creatinine, Urine  Date Value Ref Range Status  06/02/2019 56 20 - 275 mg/dL Final          Failed - Last BP in normal range    BP Readings from Last 1 Encounters:  11/09/19 (!) 163/82          Passed - K in normal range and within 360 days    Potassium  Date Value Ref Range Status  11/08/2019 4.4 3.5 - 5.1 mmol/L Final  07/08/2012 4.0 3.5 - 5.1 mmol/L Final          Passed - Ca in normal range and within 360 days    Calcium  Date Value Ref Range Status  11/08/2019 9.6 8.9 - 10.3 mg/dL Final   Calcium, Total  Date Value Ref Range Status  07/08/2012 8.8 8.5 - 10.1 mg/dL Final          Passed - Na in normal range and within 360 days     Sodium  Date Value Ref Range Status  11/08/2019 142 135 - 145 mmol/L Final  10/16/2015 140 134 - 144 mmol/L Final  07/08/2012 139 136 - 145 mmol/L Final          Passed - Valid encounter within last 6 months    Recent Outpatient Visits           2 months ago Dyslipidemia   [facility] [place], Drue Stager, MD   8 months ago Hemiparesis affecting right side as late effect of cerebrovascular accident (CVA) [facility])   [facility] Steele Sizer, MD   11 months ago Arteriosclerosis of coronary artery   [facility] Steele Sizer, MD   1 year ago Sepsis due to Escherichia coli with acute renal failure without septic shock, unspecified acute renal failure type [facility])   [place], FNP   1 year ago Hemiparesis affecting right side as late effect of cerebrovascular accident (CVA) [facility])   [facility] Steele Sizer, MD

## 2019-12-22 NOTE — Telephone Encounter (Signed)
Copied from [place] [id number]. Topic: Quick Communication - Rx Refill/Question >> Dec 22, 2019 11:49 AM Rainey Pines A wrote: Medication: furosemide (LASIX) 40 MG tablet  Has the patient contacted their pharmacy?yes (Agent: If no, request that the patient contact the pharmacy for the refill.) (Agent: If yes, when and what did the pharmacy advise?)contact pcp  Preferred Pharmacy (with phone number or street name): [id number] [place], [place] - [address]  Phone:  [phone] Fax:  [phone]     Agent: Please be advised that RX refills may take up to 3 business days. We ask that you follow-up with your pharmacy.

## 2019-12-27 ENCOUNTER — Other Ambulatory Visit: Payer: Self-pay | Admitting: Family Medicine

## 2019-12-27 NOTE — Telephone Encounter (Signed)
Pts daughter called in regarding pts medication. Medication is currently in a pending status. Please advise

## 2019-12-28 ENCOUNTER — Telehealth: Payer: Self-pay

## 2019-12-28 NOTE — Telephone Encounter (Signed)
Please clarify what you would like me to say to the patient about coming in.

## 2019-12-28 NOTE — Telephone Encounter (Signed)
Patient is scheduled to come in on 12/29/19 for an appointment and she will be accompanied by her daughter, [place].

## 2019-12-28 NOTE — Telephone Encounter (Signed)
Copied from [place] [id number]. Topic: General - Other >> Dec 27, 2019  2:10 PM Antonieta Iba C wrote: Reason for CRM: pt''s daughter Kerie Badger is returning the office call in regards to [facility] paperwork. She said that they have to be returned by 01/04/20.   She would like a call back if any concerns. >> Dec 28, 2019 12:40 PM Sharene Skeans wrote: Daughter [name] called to speak with Cassandra and asked questions about why Pt needs to come in / Pt has been doing appts over the phone/ she asked that Cassandra please call sister [name] today asap / Dr. Ancil Boozer next available appt was 5.12.21 and the paperwork is due by that day/ please advise  >> Dec 27, 2019  4:32 PM Keene Breath wrote: Called to speak with Cassandra regarding an appt.  Please call back at [phone] >> Dec 27, 2019  2:50 PM Samson Frederic wrote: Per Dr Ancil Boozer she must be seen--lvm for pt to schedule appt in order for Dr Ancil Boozer to fill out FMLA paperwork

## 2019-12-29 ENCOUNTER — Other Ambulatory Visit: Payer: Self-pay

## 2019-12-29 ENCOUNTER — Encounter: Payer: Self-pay | Admitting: Family Medicine

## 2019-12-29 ENCOUNTER — Ambulatory Visit (INDEPENDENT_AMBULATORY_CARE_PROVIDER_SITE_OTHER): Payer: Medicare Other | Admitting: Family Medicine

## 2019-12-29 VITALS — BP 120/60 | HR 102 | Temp 97.8°F | Resp 16 | Ht 60.0 in | Wt 105.0 lb

## 2019-12-29 DIAGNOSIS — R4701 Aphasia: Secondary | ICD-10-CM

## 2019-12-29 DIAGNOSIS — M21371 Foot drop, right foot: Secondary | ICD-10-CM | POA: Diagnosis not present

## 2019-12-29 DIAGNOSIS — I69351 Hemiplegia and hemiparesis following cerebral infarction affecting right dominant side: Secondary | ICD-10-CM | POA: Diagnosis not present

## 2019-12-29 DIAGNOSIS — I2510 Atherosclerotic heart disease of native coronary artery without angina pectoris: Secondary | ICD-10-CM

## 2019-12-29 NOTE — Progress Notes (Signed)
Name: Crystal Faulkner   MRN: [id number]    DOB: Jan 11, 1925   Date:12/29/2019       Progress Note  Subjective  Chief Complaint  Chief Complaint  Patient presents with  . Form Completion    FMLA    HPI  She has follow ups with [place] about every 6 months now, she is frail but stable, she also sees nephrologist twice a year. She is bruising easily and has difficulty with ambulation due to CVA and right foot drop. Lissa Merlin , her daughter ( came in with her today), she asked me about a brace to help with foot drop right side. She ambulates using a walker at home, and drags her right leg. She uses wheelchair when out of the house. She lives with another daughter , we called her and she agreed on having home health/PT stop by to evaluate her and see if a brace would be beneficial.  She is unable to speak and not able to stand up without assistance to walk in our office today.     Patient Active Problem List   Diagnosis Date Noted  . Osteoarthritis 11/19/2017  . Dysarthria due to recent cerebrovascular accident (CVA) 06/23/2016  . Hemiparesis affecting right side as late effect of cerebrovascular accident (CVA) ([place]) 06/23/2016  . CKD (chronic kidney disease) stage 5, GFR less than 15 ml/min (HCC) 06/23/2016  . Senile purpura ([place]) 05/13/2016  . Hiatal hernia 05/04/2016  . Thoracic aorta atherosclerosis ([place]) 05/04/2016  . Chronic stable angina ([place]) 10/16/2015  . Secondary hyperparathyroidism ([place]) 04/13/2015  . Arteriosclerosis of coronary artery 04/08/2015  . Benign hypertension 04/08/2015  . Controlled gout 04/08/2015  . History of peptic ulcer disease 04/08/2015  . Adult hypothyroidism 04/08/2015  . Mild cognitive disorder 04/08/2015  . Anemia of chronic disease 04/08/2015  . Osteoarthrosis involving more than one site but not generalized 04/08/2015  . Osteoporosis 04/08/2015  . Pelvic muscle wasting 04/08/2015  . Allergic rhinitis 04/08/2015  . Chronic diastolic CHF (congestive  heart failure) ([place]) 03/29/2014  . HLD (hyperlipidemia) 03/29/2014  . H/O gastrointestinal hemorrhage 04/05/2007    Past Surgical History:  Procedure Laterality Date  . EXPLORATORY LAPAROTOMY    . EYE SURGERY Bilateral    cataract    Family History  Problem Relation Age of Onset  . Diabetes Sister   . Hypertension Sister   . Hypertension Brother   . Heart disease Brother     Social History   Tobacco Use  . Smoking status: Never Smoker  . Smokeless tobacco: Former Systems developer    Types: Snuff  . Tobacco comment: smoking cessation materials not requuired  Substance Use Topics  . Alcohol use: No     Current Outpatient Medications:  .  allopurinol (ZYLOPRIM) 100 MG tablet, TAKE 1 TABLET BY MOUTH EVERY DAY, Disp: 90 tablet, Rfl: 2 .  calcitRIOL (ROCALTROL) 0.25 MCG capsule, Take 0.25 mcg by mouth daily., Disp: , Rfl:  .  clopidogrel (PLAVIX) 75 MG tablet, Take 1 tablet (75 mg total) by mouth daily., Disp: 90 tablet, Rfl: 1 .  dexlansoprazole (DEXILANT) 60 MG capsule, Take by mouth., Disp: , Rfl:  .  ferrous sulfate 325 (65 FE) MG tablet, Take 325 mg by mouth 2 (two) times daily with a meal., Disp: , Rfl:  .  isosorbide mononitrate (IMDUR) 60 MG 24 hr tablet, TAKE 1 TABLET BY MOUTH EVERY DAY, Disp: 90 tablet, Rfl: 2 .  levothyroxine (SYNTHROID) 50 MCG tablet, Take 1 tablet (50 mcg  total) by mouth daily at 6 (six) AM., Disp: 90 tablet, Rfl: 1 .  pantoprazole (PROTONIX) 40 MG tablet, Take 1 tablet (40 mg total) by mouth daily., Disp: 90 tablet, Rfl: 1 .  raloxifene (EVISTA) 60 MG tablet, Take 1 tablet (60 mg total) by mouth daily., Disp: 90 tablet, Rfl: 2 .  rosuvastatin (CRESTOR) 10 MG tablet, Take 1 tablet (10 mg total) by mouth daily., Disp: 90 tablet, Rfl: 1 .  sodium bicarbonate 650 MG tablet, Take 650 mg by mouth daily. , Disp: , Rfl: 11 .  furosemide (LASIX) 40 MG tablet, Take 1 tablet (40 mg total) by mouth every other day., Disp: 15 tablet, Rfl: 11  No Known Allergies  I  personally reviewed active problem list, medication list, allergies, family history, social history, health maintenance with the patient/caregiver today.   ROS  Unable to give her information, does not speak  Objective  Vitals:   12/29/19 1154  BP: 120/60  Pulse: (!) 102  Resp: 16  Temp: 97.8 F (36.6 C)  TempSrc: Temporal  SpO2: 96%  Weight: 105 lb (47.6 kg)  Height: 5' (1.524 m)    Body mass index is 20.51 kg/m.  Physical Exam  Constitutional: Patient appears frail, sitting on her wheelchair , but in no  distress.  HEENT: head atraumatic, normocephalic, pupils equal and reactive to light Cardiovascular: Normal rate, regular rhythm and normal heart sounds.  No murmur heard. No BLE edema. Pulmonary/Chest: Effort normal and breath sounds normal. No respiratory distress. Abdominal: Soft.  There is no tenderness. Skin: senile purpura , she also has some blood blisters on right lower leg and on 5th finger, she walks with assistance at home and has very frail skin  Muscular skeletal: sitting on her wheelchair without distress  Psychiatric: Patient has a normal mood and affect. Smiling and cooperative, but non communicative   Recent Results (from the past 2160 hour(s))  CBC with Differential     Status: Abnormal   Collection Time: 11/08/19 11:46 PM  Result Value Ref Range   WBC 7.5 4.0 - 10.5 K/uL   RBC 3.29 (L) 3.87 - 5.11 MIL/uL   Hemoglobin 10.2 (L) 12.0 - 15.0 g/dL   HCT 33.3 (L) 36.0 - 46.0 %   MCV 101.2 (H) 80.0 - 100.0 fL   MCH 31.0 26.0 - 34.0 pg   MCHC 30.6 30.0 - 36.0 g/dL   RDW 14.8 11.5 - 15.5 %   Platelets 200 150 - 400 K/uL   nRBC 0.0 0.0 - 0.2 %   Neutrophils Relative % 63 %   Neutro Abs 4.7 1.7 - 7.7 K/uL   Lymphocytes Relative 25 %   Lymphs Abs 1.8 0.7 - 4.0 K/uL   Monocytes Relative 9 %   Monocytes Absolute 0.7 0.1 - 1.0 K/uL   Eosinophils Relative 2 %   Eosinophils Absolute 0.2 0.0 - 0.5 K/uL   Basophils Relative 1 %   Basophils Absolute 0.1 0.0  - 0.1 K/uL   Immature Granulocytes 0 %   Abs Immature Granulocytes 0.03 0.00 - 0.07 K/uL    Comment: Performed at [facility], [address] [place], [place] [address]  Comprehensive metabolic panel     Status: Abnormal   Collection Time: 11/08/19 11:46 PM  Result Value Ref Range   Sodium 142 135 - 145 mmol/L   Potassium 4.4 3.5 - 5.1 mmol/L   Chloride 107 98 - 111 mmol/L   CO2 25 22 - 32 mmol/L   Glucose,  Bld 130 (H) 70 - 99 mg/dL    Comment: Glucose reference range applies only to samples taken after fasting for at least 8 hours.   BUN 50 (H) 8 - 23 mg/dL   Creatinine, Ser 3.44 (H) 0.44 - 1.00 mg/dL   Calcium 9.6 8.9 - 10.3 mg/dL   Total Protein 6.7 6.5 - 8.1 g/dL   Albumin 3.4 (L) 3.5 - 5.0 g/dL   AST 16 15 - 41 U/L   ALT 12 0 - 44 U/L   Alkaline Phosphatase 51 38 - 126 U/L   Total Bilirubin 0.5 0.3 - 1.2 mg/dL   GFR calc non Af Amer 11 (L) >60 mL/min   GFR calc Af Amer 13 (L) >60 mL/min   Anion gap 10 5 - 15    Comment: Performed at [facility], [address] [place], [place] [address]  Lactic acid, plasma     Status: None   Collection Time: 11/08/19 11:46 PM  Result Value Ref Range   Lactic Acid, Venous 1.5 0.5 - 1.9 mmol/L    Comment: Performed at [facility], [address] [place], [place] [address]  Blood culture (routine x 2)     Status: None   Collection Time: 11/08/19 11:46 PM   Specimen: BLOOD  Result Value Ref Range   Specimen Description      BLOOD LEFT ANTECUBITAL Performed at [facility], [place] [address] [place], [place] [address]    Special Requests      BOTTLES DRAWN AEROBIC AND ANAEROBIC Blood Culture adequate volume Performed at [facility] [address], [place], [place] [address]    Culture      NO GROWTH 5 DAYS Performed at [facility], [address] [place], [place] [address]    Report Status 11/14/2019 FINAL      PHQ2/9: Depression screen [facility] 2/9  12/29/2019 11/22/2019 10/17/2019 04/11/2019 01/07/2019  Decreased Interest 0 0 0 0 0  Down, Depressed, Hopeless 0 0 0 0 0  PHQ - 2 Score 0 0 0 0 0  Altered sleeping 0 - 0 0 0  Tired, decreased energy 0 - 0 0 0  Change in appetite 0 - 0 0 0  Feeling bad or failure about yourself  0 - 0 0 0  Trouble concentrating 0 - 0 0 0  Moving slowly or fidgety/restless 0 - 0 0 0  Suicidal thoughts 0 - 0 0 0  PHQ-9 Score 0 - 0 0 0  Difficult doing work/chores - - Not difficult at all Not difficult at all -  Some recent data might be hidden    phq 9 is negative   Fall Risk: Fall Risk  12/29/2019 11/22/2019 10/17/2019 04/11/2019 01/07/2019  Falls in the past year? 0 0 0 0 0  Comment - - - - -  Number falls in past yr: 0 0 0 0 0  Injury with Fall? 0 0 0 0 0  Comment - - - - -  Risk Factor Category  - - - - -  Risk for fall due to : - Impaired balance/gait;Impaired mobility - - -  Risk for fall due to: Comment - - - - -  Follow up - Falls prevention discussed - - -     Functional Status Survey: Is the patient deaf or have difficulty hearing?: No Does the patient have difficulty seeing, even when wearing glasses/contacts?: No Does the patient have difficulty concentrating, remembering, or making decisions?:  No Does the patient have difficulty walking or climbing stairs?: Yes Does the patient have difficulty dressing or bathing?: Yes Does the patient have difficulty doing errands alone such as visiting a doctor's office or shopping?: Yes    Assessment & Plan  1. Right foot drop  - Ambulatory referral to Home Health  2. Hemiparesis affecting right side as late effect of cerebrovascular accident (CVA) [facility])  - Ambulatory referral to [place] forms filled out for her daughter so she can take time to bring her to office visits  3. Mutism  Stable

## 2020-01-03 ENCOUNTER — Telehealth: Payer: Self-pay | Admitting: Family Medicine

## 2020-01-03 NOTE — Telephone Encounter (Signed)
Soni with [facility] states she just saw the pt for PT eval.  However, all the pt wants is a foot drop brace.  So so need for him to return to see pt Pt would like you to sen order in for this brace  cb  [phone]

## 2020-01-04 ENCOUNTER — Other Ambulatory Visit: Payer: Self-pay | Admitting: Family Medicine

## 2020-01-04 NOTE — Telephone Encounter (Signed)
It is called Foot drop brace for left foot but this office do not do. She will have to be referred to Podiatry

## 2020-01-04 NOTE — Telephone Encounter (Signed)
Are you going to ok a brace or must she due PT

## 2020-01-05 ENCOUNTER — Telehealth: Payer: Self-pay

## 2020-01-05 NOTE — Telephone Encounter (Signed)
Copied from [place] [phone] [phone]. Topic: General - Other >> Jan 05, 2020 10:31 AM Leward Quan A wrote: Reason for CRM: Patient daughter [name] called to say that she was informed by [facility] nurse to ask Dr Ancil Boozer to please order a foot drop brace for the patient because of damage sustained from a stroke. Please call [name] with questions at Ph# [phone]

## 2020-01-06 ENCOUNTER — Other Ambulatory Visit: Payer: Self-pay | Admitting: Emergency Medicine

## 2020-01-06 ENCOUNTER — Telehealth: Payer: Self-pay | Admitting: Family Medicine

## 2020-01-06 DIAGNOSIS — M21371 Foot drop, right foot: Secondary | ICD-10-CM

## 2020-01-06 NOTE — Telephone Encounter (Signed)
Order put in system 

## 2020-01-06 NOTE — Telephone Encounter (Signed)
Pt is requsting a referral to be put in for her to see Dr  Crystal Faulkner (podiatry at [facility]). She use to see him years ago

## 2020-01-09 NOTE — Telephone Encounter (Signed)
Pt was informed on Friday when she picked up her FMLA paperwork

## 2020-01-10 NOTE — Telephone Encounter (Signed)
Patient daughter notified of referral

## 2020-01-27 ENCOUNTER — Other Ambulatory Visit: Payer: Self-pay

## 2020-01-27 ENCOUNTER — Ambulatory Visit (INDEPENDENT_AMBULATORY_CARE_PROVIDER_SITE_OTHER): Payer: Medicare Other | Admitting: Podiatry

## 2020-01-27 ENCOUNTER — Encounter: Payer: Self-pay | Admitting: Podiatry

## 2020-01-27 DIAGNOSIS — M21541 Acquired clubfoot, right foot: Secondary | ICD-10-CM | POA: Diagnosis not present

## 2020-01-27 DIAGNOSIS — I6930 Unspecified sequelae of cerebral infarction: Secondary | ICD-10-CM | POA: Diagnosis not present

## 2020-01-29 NOTE — Progress Notes (Signed)
   HPI: 86 y.o. female nonverbal secondary to stroke presenting today as a new patient for evaluation of right lower extremity deformity.  The patient presents today with her daughter.  She states that in 2017 she suffered a stroke affecting her right lower extremity.  Over the past 3-4 years she has developed a cavovarus deformity of her foot.  When she walks she states that her foot turns in.  The patient's daughter is concerned for slip and fall injury and overall instability during ambulation  Past Medical History:  Diagnosis Date  . Allergy   . Alzheimer's disease ([place])   . Anemia   . CCF (congestive cardiac failure) ([place])   . Chronic kidney disease   . Chronic stable angina ([place])   . Frequent falls   . Gout   . H/O: GI bleed   . Hypercholesteremia   . Hyperglycemia   . Hyperlipidemia   . Hypertension   . Incontinence   . Mixed dementia ([place])   . Myocardial infarction ([place])   . Numbness of right hand   . Osteoporosis   . Right hand weakness   . Stroke ([place])   . Thyroid disease   . Vitamin D deficiency      Physical Exam: General: The patient is alert and oriented x3 in no acute distress.  Dermatology: Skin is warm, dry and supple bilateral lower extremities. Negative for open lesions or macerations.  Vascular: Palpable pedal pulses bilaterally. No edema or erythema noted. Capillary refill within normal limits.  Neurological: Epicritic and protective threshold grossly intact bilaterally.   Musculoskeletal Exam: Range of motion within normal limits to all pedal and ankle joints bilateral. Muscle strength 5/5 in all groups bilateral.  There is a semireducible contracture, cavovarus deformity with equinus.  Likely due to peroneal nerve injury and imbalance of the tendons of the foot  Radiographic Exam taken 11/08/2019 by PCP:  There is a cavovarus deformity noted with equinus.  Given the patient's age there is normal osseous mineralization and no fracture identified.  Joints  are roughly preserved.  Assessment: 1.  History of stroke right lower extremity 2017 2.  Cavovarus equinus deformity right lower extremity   Plan of Care:  1. Patient evaluated. X-Rays reviewed.  2.  I explained to the patient's daughter that her best option for treatment would be an AFO brace to provide stability and keep the foot in a rectus alignment during ambulation.  Patient's daughter agrees. 3.  Appointment with Pedorthist for AFO brace 4.  Return to clinic as needed      Edrick Kins, DPM [facility]  Dr. Edrick Kins, DPM    [address], [place] [address]                Office [phone]  Fax [phone]

## 2020-02-22 ENCOUNTER — Telehealth: Payer: Self-pay | Admitting: Family Medicine

## 2020-02-22 ENCOUNTER — Other Ambulatory Visit: Payer: Self-pay

## 2020-02-22 ENCOUNTER — Ambulatory Visit: Payer: Medicare Other | Admitting: Orthotics

## 2020-02-22 DIAGNOSIS — I6930 Unspecified sequelae of cerebral infarction: Secondary | ICD-10-CM

## 2020-02-22 DIAGNOSIS — M21541 Acquired clubfoot, right foot: Secondary | ICD-10-CM

## 2020-02-22 NOTE — Progress Notes (Signed)
Extremely hard to cast due to sever cavovarus deformity secondary to peroneal nerve damage.  Cast her for Richie style brace to control RF/FF motion (adducto varus); she is flexible.

## 2020-02-22 NOTE — Telephone Encounter (Signed)
Ret'd call to pt.  Spoke with daughter, [name].  Daughter stated pt. has a sore is on the right lower leg, just above the ankle.  Daughter reported she has been cleaning the area with soap and water daily, and applying Neosporin oint.  Stated there is a scab over the area about dime-size with redness surrounding the scab.  Reported the redness has worsened in past 1-2 days.  Advised that pt. will need to sched. Appt. For eval., before an antibiotic can be prescribed.  Daughter refused to make an appt.  Stated "I take good care of her."  Reported she thinks it is healing.  Denied any drainage from sore.  Advised to monitor for worsening; ie: increased redness/ spreading of redness into the surrounding tissue, increased warmth of area; swelling of the foot/ lower leg, drainage from site, increased size of sore, pain, and fever.  Daughter verb. Understanding.  Questioned if she could apply Iodine to the area.  Nurse advised against this at this time; advised that the Iodine could be more irritating to the fragile tissue, that is not completely healed.  Encouraged to call for appt. if signs of any worsening.  Daughter verb. Understanding.

## 2020-02-22 NOTE — Telephone Encounter (Signed)
Patient called requesting a antibiotic for infection stated the patients leg has a scab that is turning red and looking infected,she states she does not need a appt bc she knows its infected.please advise

## 2020-02-22 NOTE — Telephone Encounter (Signed)
Attempted to return call to Jovi Alvizo @ [phone]; the phone rang multiple times; no answer and no option to leave vm.

## 2020-03-14 ENCOUNTER — Other Ambulatory Visit: Payer: Medicare Other | Admitting: Orthotics

## 2020-03-28 ENCOUNTER — Other Ambulatory Visit: Payer: Self-pay

## 2020-03-28 ENCOUNTER — Ambulatory Visit: Payer: Medicare Other | Admitting: Orthotics

## 2020-03-28 DIAGNOSIS — I6930 Unspecified sequelae of cerebral infarction: Secondary | ICD-10-CM

## 2020-03-28 DIAGNOSIS — I69351 Hemiplegia and hemiparesis following cerebral infarction affecting right dominant side: Secondary | ICD-10-CM

## 2020-03-28 DIAGNOSIS — M21541 Acquired clubfoot, right foot: Secondary | ICD-10-CM

## 2020-03-28 NOTE — Progress Notes (Signed)
Couldn't get foot to fit into brace, as the varus deformity (adducto varus ff) was fighting getting RF and FF in rectus/neutral; possibly need to change type of brace to a more rigid [place]; however, I did give a rx to [facility] to evaluate and perhaps fit into a solid thermoplastic afo.

## 2020-05-01 ENCOUNTER — Other Ambulatory Visit: Payer: Self-pay | Admitting: Family Medicine

## 2020-05-01 DIAGNOSIS — I69322 Dysarthria following cerebral infarction: Secondary | ICD-10-CM

## 2020-05-01 DIAGNOSIS — E039 Hypothyroidism, unspecified: Secondary | ICD-10-CM

## 2020-05-01 DIAGNOSIS — I69351 Hemiplegia and hemiparesis following cerebral infarction affecting right dominant side: Secondary | ICD-10-CM

## 2020-05-01 DIAGNOSIS — E785 Hyperlipidemia, unspecified: Secondary | ICD-10-CM

## 2020-05-01 DIAGNOSIS — I208 Other forms of angina pectoris: Secondary | ICD-10-CM

## 2020-05-01 DIAGNOSIS — I2510 Atherosclerotic heart disease of native coronary artery without angina pectoris: Secondary | ICD-10-CM

## 2020-05-17 DIAGNOSIS — M21371 Foot drop, right foot: Secondary | ICD-10-CM | POA: Diagnosis not present

## 2020-05-30 ENCOUNTER — Ambulatory Visit (INDEPENDENT_AMBULATORY_CARE_PROVIDER_SITE_OTHER): Payer: Medicare HMO | Admitting: Family Medicine

## 2020-05-30 ENCOUNTER — Encounter: Payer: Self-pay | Admitting: Family Medicine

## 2020-05-30 ENCOUNTER — Other Ambulatory Visit: Payer: Self-pay

## 2020-05-30 VITALS — BP 110/78 | HR 78 | Temp 97.4°F | Resp 14

## 2020-05-30 DIAGNOSIS — H9201 Otalgia, right ear: Secondary | ICD-10-CM

## 2020-05-30 DIAGNOSIS — H6121 Impacted cerumen, right ear: Secondary | ICD-10-CM

## 2020-05-30 DIAGNOSIS — Z23 Encounter for immunization: Secondary | ICD-10-CM | POA: Diagnosis not present

## 2020-05-30 DIAGNOSIS — H60501 Unspecified acute noninfective otitis externa, right ear: Secondary | ICD-10-CM | POA: Diagnosis not present

## 2020-05-30 MED ORDER — OFLOXACIN 0.3 % OT SOLN: 5.0000 [drp] | Freq: Two times a day (BID) | OTIC | 0 refills | Status: AC

## 2020-05-30 NOTE — Progress Notes (Signed)
Patient ID: Crystal Faulkner, female    DOB: June 07, 1925, 86 y.o.   MRN: [id number]  PCP: Steele Sizer, MD  Chief Complaint  Patient presents with  . Otalgia    right ear    Subjective:   Crystal Faulkner is a 86 y.o. female, presents to clinic with CC of the following:  Pt presents with her daughter Crystal Faulkner with CC of right otalgia.  She lives with her other daughter and had held her right ear last night and was crying.  Pt has hx of stroke with aphasia, is unable to give hx.  All Crystal Faulkner states is her sister said she hurt and cried last night.  Pt is not currently complaining of pain.  Crystal Faulkner does not know if there is any hx of ear infections or cerumen impaction.  Nothing tried yet for discomfort.  No known trauma.  Pt had not seemed to have any nasal sx or sore throat.  No fever.  Otalgia  There is pain in the right ear. This is a new problem. The current episode started yesterday.      Patient Active Problem List   Diagnosis Date Noted  . Osteoarthritis 11/19/2017  . Dysarthria due to recent cerebrovascular accident (CVA) 06/23/2016  . Hemiparesis affecting right side as late effect of cerebrovascular accident (CVA) ([place]) 06/23/2016  . CKD (chronic kidney disease) stage 5, GFR less than 15 ml/min (HCC) 06/23/2016  . Senile purpura ([place]) 05/13/2016  . Hiatal hernia 05/04/2016  . Thoracic aorta atherosclerosis ([place]) 05/04/2016  . Chronic stable angina ([place]) 10/16/2015  . Secondary hyperparathyroidism ([place]) 04/13/2015  . Arteriosclerosis of coronary artery 04/08/2015  . Benign hypertension 04/08/2015  . Controlled gout 04/08/2015  . History of peptic ulcer disease 04/08/2015  . Adult hypothyroidism 04/08/2015  . Mild cognitive disorder 04/08/2015  . Anemia of chronic disease 04/08/2015  . Osteoarthrosis involving more than one site but not generalized 04/08/2015  . Osteoporosis 04/08/2015  . Pelvic muscle wasting 04/08/2015  . Allergic rhinitis 04/08/2015  .  Chronic diastolic CHF (congestive heart failure) ([place]) 03/29/2014  . HLD (hyperlipidemia) 03/29/2014  . H/O gastrointestinal hemorrhage 04/05/2007      Current Outpatient Medications:  .  allopurinol (ZYLOPRIM) 100 MG tablet, TAKE 1 TABLET BY MOUTH EVERY DAY, Disp: 90 tablet, Rfl: 2 .  calcitRIOL (ROCALTROL) 0.25 MCG capsule, Take 0.25 mcg by mouth daily., Disp: , Rfl:  .  clopidogrel (PLAVIX) 75 MG tablet, TAKE 1 TABLET BY MOUTH EVERY DAY, Disp: 90 tablet, Rfl: 1 .  dexlansoprazole (DEXILANT) 60 MG capsule, Take by mouth., Disp: , Rfl:  .  ferrous sulfate 325 (65 FE) MG tablet, Take 325 mg by mouth 2 (two) times daily with a meal., Disp: , Rfl:  .  isosorbide mononitrate (IMDUR) 60 MG 24 hr tablet, TAKE 1 TABLET BY MOUTH EVERY DAY, Disp: 90 tablet, Rfl: 2 .  levothyroxine (SYNTHROID) 50 MCG tablet, TAKE 1 TABLET (50 MCG TOTAL) BY MOUTH DAILY AT 6 (SIX) AM., Disp: 90 tablet, Rfl: 1 .  pantoprazole (PROTONIX) 40 MG tablet, Take 1 tablet (40 mg total) by mouth daily., Disp: 90 tablet, Rfl: 1 .  raloxifene (EVISTA) 60 MG tablet, Take 1 tablet (60 mg total) by mouth daily., Disp: 90 tablet, Rfl: 2 .  rosuvastatin (CRESTOR) 10 MG tablet, TAKE 1 TABLET BY MOUTH EVERY DAY, Disp: 90 tablet, Rfl: 1 .  sodium bicarbonate 650 MG tablet, Take 650 mg by mouth daily. , Disp: , Rfl: 11 .  furosemide (LASIX) 40 MG tablet, Take 1 tablet (40 mg total) by mouth every other day., Disp: 15 tablet, Rfl: 11   No Known Allergies   Social History   Tobacco Use  . Smoking status: Never Smoker  . Smokeless tobacco: Former Systems developer    Types: Snuff  . Tobacco comment: smoking cessation materials not requuired  Vaping Use  . Vaping Use: Never used  Substance Use Topics  . Alcohol use: No  . Drug use: No      Chart Review Today: I personally reviewed active problem list, medication list, allergies, family history, social history, health maintenance, notes from last encounter, lab results, imaging with the  patient/caregiver today.   Review of Systems  HENT: Positive for ear pain.   10 Systems reviewed and are negative for acute change except as noted in the HPI.  ROS limited due to pt condition (aphasia) and limited hx given by daughter, primary caregiver is not with her today      Objective:   Vitals:   05/30/20 0903  BP: 110/78  Pulse: 78  Resp: 14  Temp: (!) 97.4 F (36.3 C)  TempSrc: Oral  SpO2: 98%    There is no height or weight on file to calculate BMI.   Physical Exam Vitals and nursing note reviewed.  Constitutional:      General: She is not in acute distress.    Appearance: Normal appearance. She is not ill-appearing, toxic-appearing or diaphoretic.     Comments: Elderly, appears stated age, NAD, pt alert  HENT:     Head: Normocephalic and atraumatic. No right periorbital erythema or left periorbital erythema.     Jaw: No trismus, tenderness or pain on movement.     Salivary Glands: Right salivary gland is not diffusely enlarged or tender. Left salivary gland is not diffusely enlarged or tender.     Right Ear: No swelling or tenderness. There is impacted cerumen. No mastoid tenderness.     Left Ear: Tympanic membrane and ear canal normal. No swelling or tenderness.  No middle ear effusion. There is no impacted cerumen. No mastoid tenderness.     Nose: Nose normal. No rhinorrhea.     Mouth/Throat:     Mouth: Mucous membranes are dry.     Pharynx: Oropharynx is clear. No oropharyngeal exudate or posterior oropharyngeal erythema.     Comments: No teeth, no dentures Tongue slightly dry, black color to areas of tongue Eyes:     General: No scleral icterus.       Right eye: No discharge.        Left eye: No discharge.     Conjunctiva/sclera: Conjunctivae normal.  Cardiovascular:     Pulses: Normal pulses.     Heart sounds: Normal heart sounds.  Pulmonary:     Effort: Pulmonary effort is normal.     Breath sounds: Normal breath sounds.  Lymphadenopathy:      Head:     Right side of head: No submental, submandibular, tonsillar, preauricular, posterior auricular or occipital adenopathy.     Left side of head: No submental, submandibular, tonsillar, preauricular, posterior auricular or occipital adenopathy.     Cervical: No cervical adenopathy.  Skin:    General: Skin is warm and dry.     Coloration: Skin is not jaundiced or pale.  Neurological:     Mental Status: She is alert.     Cranial Nerves: No facial asymmetry.     Comments: In [facility]  Psychiatric:  Attention and Perception: Attention normal.        Mood and Affect: Mood normal.        Speech: She is noncommunicative.        Behavior: Behavior is cooperative.     Ear Cerumen Removal  Date/Time: 05/30/2020 9:28 AM Performed by: Delsa Grana, PA-C Authorized by: Delsa Grana, PA-C   Anesthesia: Local Anesthetic: none Location details: right ear Patient tolerance: patient tolerated the procedure well with no immediate complications Procedure type: irrigation  Sedation: Patient sedated: no    Indication: Cerumen impaction of the ear(s)  Medical necessity statement: On physical examination, cerumen impairs clinically significant portions of the external auditory canal, and tympanic membrane. Noted obstructive, copious cerumen that cannot be removed without magnification and instrumentations requiring MD/APP skills/procedure  Consent: Discussed benefits and risks of procedure and verbal consent obtained  Procedure:  Cerumen Disimpaction  Patient was prepped for the procedure.  Utilized an otoscope to assess and take note of the ear canal, the tympanic membrane, and the presence, amount, and placement of the cerumen.  Gentle ear lavage with warm water and hydrogen peroxide performed on the right ear.   Soft plastic curette and rounded tweezers was also utilized to remove cerumen by myself with direct visualization. - partial removal of wax  Pt did not tolerate irrigation She  tolerated some manual removal   Post procedure examination: shows cerumen was not completely removed.  There was some visible canal erythema and scant bleeding.  the tympanic membrane was not visible on the right  The patient is made aware that they may experience temporary vertigo, temporary hearing loss, and temporary discomfort.  If these symptom last for more than 24 hours to follow up in clinic.    Assessment & Plan:   1. Acute otalgia, right Initial exam - she has no signs of otitis externa or URI (no URI sx noted either), no lymphadenopathy no tenderness to tragus pinna or external ear Only noted abnormality is obstructing cerumen to right ear which appears dark brown shiny and soft Will attempt to irrigate ear and reexamine  Patient is unable to give history due to aphasia she is sitting calmly in her wheelchair appears stated age, elderly and frail but nontoxic and does not appear to have any discomfort at distress no wincing or expression of pain when I examined her  2. Impacted cerumen of right ear - Ear Cerumen Removal Did not tolerate irrigation very well- didn't like it Not completely removed - manually removed as noted above   3.  Need for influenza vaccination - Flu Vaccine QUAD High Dose(Fluad)  4. Acute otitis externa of right ear, unspecified type Unable to visualize inner half of external auditory canal, signs of erythema and scant bleeding, TM not visible - abx drops to right ear x 7 d, no other OTC meds, drops and nothing in ear at home Tylenol prn for pain Recheck on Monday Explained ofloxacin safe in middle ear - ofloxacin (FLOXIN) 0.3 % OTIC solution; Place 5 drops into the right ear 2 (two) times daily for 7 days.  Dispense: 10 mL; Refill: 0     pts outer ear, with little manipulation during irrigation procedure - she had frail skin and some bleeding to her skin on outer ear, some blood tinged fluid noted deep in ear canal after irrigation, when I manually  removed more wax there was more canal visible that was inflamed, but no additional bleeding or blood tinged fluid noted.  No  purulence.  Unfortunately pt unable to tell me if she had any improvement in sx, but she did tolerate manual cerumen removal and exam afterwards without any visible signs of pain or distress.     Return in about 5 days (around 06/04/2020) for monday recheck ear and TM with me or PCP .   Greater than 50% of this visit was spent in direct face-to-face counseling, obtaining history and physical, discussing and educating pt on treatment plan.  Total time of this visit was 45+ min.  Remainder of time involved but was not limited to reviewing chart (recent and pertinent OV notes and labs), documentation in EMR, and coordinating care and treatment plan.     Delsa Grana, PA-C 05/30/20 9:20 AM

## 2020-05-30 NOTE — Patient Instructions (Signed)
Apply ear drops twice a day to right ear - 5 drops and have her tilt head or lay to the side holding medicine in for a few minutes if able

## 2020-06-04 ENCOUNTER — Encounter: Payer: Self-pay | Admitting: Family Medicine

## 2020-06-04 ENCOUNTER — Ambulatory Visit (INDEPENDENT_AMBULATORY_CARE_PROVIDER_SITE_OTHER): Payer: Medicare HMO | Admitting: Family Medicine

## 2020-06-04 ENCOUNTER — Other Ambulatory Visit: Payer: Self-pay

## 2020-06-04 ENCOUNTER — Telehealth: Payer: Self-pay

## 2020-06-04 VITALS — BP 120/60 | HR 77 | Temp 97.9°F | Resp 14

## 2020-06-04 DIAGNOSIS — H6121 Impacted cerumen, right ear: Secondary | ICD-10-CM | POA: Diagnosis not present

## 2020-06-04 NOTE — Patient Instructions (Signed)
Let me know if you would like an ENT referral    Earwax Buildup, Adult The ears produce a substance called earwax that helps keep bacteria out of the ear and protects the skin in the ear canal. Occasionally, earwax can build up in the ear and cause discomfort or hearing loss. What increases the risk? This condition is more likely to develop in people who:  Are female.  Are elderly.  Naturally produce more earwax.  Clean their ears often with cotton swabs.  Use earplugs often.  Use in-ear headphones often.  Wear hearing aids.  Have narrow ear canals.  Have earwax that is overly thick or sticky.  Have eczema.  Are dehydrated.  Have excess hair in the ear canal. What are the signs or symptoms? Symptoms of this condition include:  Reduced or muffled hearing.  A feeling of fullness in the ear or feeling that the ear is plugged.  Fluid coming from the ear.  Ear pain.  Ear itch.  Ringing in the ear.  Coughing.  An obvious piece of earwax that can be seen inside the ear canal. How is this diagnosed? This condition may be diagnosed based on:  Your symptoms.  Your medical history.  An ear exam. During the exam, your health care provider will look into your ear with an instrument called an otoscope. You may have tests, including a hearing test. How is this treated? This condition may be treated by:  Using ear drops to soften the earwax.  Having the earwax removed by a health care provider. The health care provider may: ? Flush the ear with water. ? Use an instrument that has a loop on the end (curette). ? Use a suction device.  Surgery to remove the wax buildup. This may be done in severe cases. Follow these instructions at home:   Take over-the-counter and prescription medicines only as told by your health care provider.  Do not put any objects, including cotton swabs, into your ear. You can clean the opening of your ear canal with a washcloth or facial  tissue.  Follow instructions from your health care provider about cleaning your ears. Do not over-clean your ears.  Drink enough fluid to keep your urine clear or pale yellow. This will help to thin the earwax.  Keep all follow-up visits as told by your health care provider. If earwax builds up in your ears often or if you use hearing aids, consider seeing your health care provider for routine, preventive ear cleanings. Ask your health care provider how often you should schedule your cleanings.  If you have hearing aids, clean them according to instructions from the manufacturer and your health care provider. Contact a health care provider if:  You have ear pain.  You develop a fever.  You have blood, pus, or other fluid coming from your ear.  You have hearing loss.  You have ringing in your ears that does not go away.  Your symptoms do not improve with treatment.  You feel like the room is spinning (vertigo). Summary  Earwax can build up in the ear and cause discomfort or hearing loss.  The most common symptoms of this condition include reduced or muffled hearing and a feeling of fullness in the ear or feeling that the ear is plugged.  This condition may be diagnosed based on your symptoms, your medical history, and an ear exam.  This condition may be treated by using ear drops to soften the earwax or by having  the earwax removed by a health care provider.  Do not put any objects, including cotton swabs, into your ear. You can clean the opening of your ear canal with a washcloth or facial tissue. This information is not intended to replace advice given to you by your health care provider. Make sure you discuss any questions you have with your health care provider. Document Revised: 07/24/2017 Document Reviewed: 10/22/2016 Elsevier Patient Education  2020 [employer].

## 2020-06-04 NOTE — Progress Notes (Signed)
Name: Crystal Faulkner   MRN: [id number]    DOB: [birth year]   Date:06/04/2020       Progress Note  Chief Complaint  Patient presents with  . Follow-up  . Cerumen Impaction     Subjective:   Crystal Faulkner is a 86 y.o. female, presents to clinic for recheck of right ear pain and cerumen impaction.  She did not tolerate irrigation last week, she was in pain, they started Abx drops, she has no complaints since OV last week.  No swelling, redness, drainage.  Pt at baseline, well appearing, pleasant, sleeping well.  Daughters are here with pt, she has aphasia secondary to stoke, non-verbal     Current Outpatient Medications:  .  allopurinol (ZYLOPRIM) 100 MG tablet, TAKE 1 TABLET BY MOUTH EVERY DAY, Disp: 90 tablet, Rfl: 2 .  calcitRIOL (ROCALTROL) 0.25 MCG capsule, Take 0.25 mcg by mouth daily., Disp: , Rfl:  .  clopidogrel (PLAVIX) 75 MG tablet, TAKE 1 TABLET BY MOUTH EVERY DAY, Disp: 90 tablet, Rfl: 1 .  dexlansoprazole (DEXILANT) 60 MG capsule, Take by mouth., Disp: , Rfl:  .  ferrous sulfate 325 (65 FE) MG tablet, Take 325 mg by mouth 2 (two) times daily with a meal., Disp: , Rfl:  .  furosemide (LASIX) 40 MG tablet, Take 1 tablet (40 mg total) by mouth every other day., Disp: 15 tablet, Rfl: 11 .  isosorbide mononitrate (IMDUR) 60 MG 24 hr tablet, TAKE 1 TABLET BY MOUTH EVERY DAY, Disp: 90 tablet, Rfl: 2 .  levothyroxine (SYNTHROID) 50 MCG tablet, TAKE 1 TABLET (50 MCG TOTAL) BY MOUTH DAILY AT 6 (SIX) AM., Disp: 90 tablet, Rfl: 1 .  ofloxacin (FLOXIN) 0.3 % OTIC solution, Place 5 drops into the right ear 2 (two) times daily for 7 days., Disp: 10 mL, Rfl: 0 .  pantoprazole (PROTONIX) 40 MG tablet, Take 1 tablet (40 mg total) by mouth daily., Disp: 90 tablet, Rfl: 1 .  raloxifene (EVISTA) 60 MG tablet, Take 1 tablet (60 mg total) by mouth daily., Disp: 90 tablet, Rfl: 2 .  rosuvastatin (CRESTOR) 10 MG tablet, TAKE 1 TABLET BY MOUTH EVERY DAY, Disp: 90 tablet, Rfl: 1 .  sodium  bicarbonate 650 MG tablet, Take 650 mg by mouth daily. , Disp: , Rfl: 11  Patient Active Problem List   Diagnosis Date Noted  . Osteoarthritis 11/19/2017  . Dysarthria due to recent cerebrovascular accident (CVA) 06/23/2016  . Hemiparesis affecting right side as late effect of cerebrovascular accident (CVA) ([place]) 06/23/2016  . CKD (chronic kidney disease) stage 5, GFR less than 15 ml/min (HCC) 06/23/2016  . Senile purpura ([place]) 05/13/2016  . Hiatal hernia 05/04/2016  . Thoracic aorta atherosclerosis ([place]) 05/04/2016  . Chronic stable angina ([place]) 10/16/2015  . Secondary hyperparathyroidism ([place]) 04/13/2015  . Arteriosclerosis of coronary artery 04/08/2015  . Benign hypertension 04/08/2015  . Controlled gout 04/08/2015  . History of peptic ulcer disease 04/08/2015  . Adult hypothyroidism 04/08/2015  . Mild cognitive disorder 04/08/2015  . Anemia of chronic disease 04/08/2015  . Osteoarthrosis involving more than one site but not generalized 04/08/2015  . Osteoporosis 04/08/2015  . Pelvic muscle wasting 04/08/2015  . Allergic rhinitis 04/08/2015  . Chronic diastolic CHF (congestive heart failure) ([place]) 03/29/2014  . HLD (hyperlipidemia) 03/29/2014  . H/O gastrointestinal hemorrhage 04/05/2007    Past Surgical History:  Procedure Laterality Date  . EXPLORATORY LAPAROTOMY    . EYE SURGERY Bilateral    cataract  Family History  Problem Relation Age of Onset  . Diabetes Sister   . Hypertension Sister   . Hypertension Brother   . Heart disease Brother     Social History   Tobacco Use  . Smoking status: Never Smoker  . Smokeless tobacco: Former Systems developer    Types: Snuff  . Tobacco comment: smoking cessation materials not requuired  Vaping Use  . Vaping Use: Never used  Substance Use Topics  . Alcohol use: No  . Drug use: No     No Known Allergies  Health Maintenance  Topic Date Due  . TETANUS/TDAP  10/15/2028  . INFLUENZA VACCINE  Completed  . DEXA SCAN  Completed   . COVID-19 Vaccine  Completed  . PNA vac Low Risk Adult  Completed    Chart Review Today: I personally reviewed active problem list, medication list, allergies, family history, social history, health maintenance, notes from last encounter, lab results, imaging with the patient/caregiver today.   Review of Systems  10 Systems reviewed and are negative for acute change except as noted in the HPI.  Objective:   Vitals:   06/04/20 1114  BP: 120/60  Pulse: 77  Resp: 14  Temp: 97.9 F (36.6 C)  SpO2: 100%    There is no height or weight on file to calculate BMI.  Physical Exam Vitals and nursing note reviewed.  HENT:     Head: Normocephalic and atraumatic.     Right Ear: Tympanic membrane normal. No drainage, swelling or tenderness. There is impacted cerumen. No mastoid tenderness.     Left Ear: Tympanic membrane, ear canal and external ear normal. No drainage, swelling or tenderness. There is no impacted cerumen.     Nose: Nose normal.  Eyes:     General:        Right eye: No discharge.        Left eye: No discharge.     Conjunctiva/sclera: Conjunctivae normal.  Lymphadenopathy:     Head:     Right side of head: No preauricular or posterior auricular adenopathy.     Left side of head: No preauricular or posterior auricular adenopathy.  Neurological:     Mental Status: She is alert.         Assessment & Plan:     ICD-10-CM   1. Impacted cerumen of right ear  H61.21    irrigation attempted again today, gently, pt had no sx over the weekend, visible portions of canal and outer ear normal today, no pain on initial exam   Not much cerumen removed by repeated irrigation. Pt no longer has complaints Outer ear normal appearing, no pain, erythema, edema, purulence.    Can d/c abx drops F/up as needed Offered ENT referral but politely declined at this time  After discussion with pt and her daughters we agree with no sx, no need to continue procedures or tx at this  time.  If desired I encouraged them to call and we'll put in ENT referral.    Pt pleasant, well appearing, she was smiling when leaving exam room and clinic today, hugged me.     Delsa Grana, PA-C 06/04/20 11:28 AM

## 2020-06-11 ENCOUNTER — Other Ambulatory Visit: Payer: Self-pay | Admitting: Family Medicine

## 2020-06-11 DIAGNOSIS — M109 Gout, unspecified: Secondary | ICD-10-CM

## 2020-06-11 NOTE — Telephone Encounter (Signed)
errenous °

## 2020-06-12 ENCOUNTER — Other Ambulatory Visit: Payer: Self-pay | Admitting: Family Medicine

## 2020-06-12 DIAGNOSIS — K219 Gastro-esophageal reflux disease without esophagitis: Secondary | ICD-10-CM

## 2020-06-12 NOTE — Telephone Encounter (Signed)
Requested Prescriptions  Pending Prescriptions Disp Refills  . pantoprazole (PROTONIX) 40 MG tablet [Pharmacy Med Name: PANTOPRAZOLE SOD DR 40 MG TAB] 90 tablet 3    Sig: TAKE 1 TABLET BY MOUTH EVERY DAY     Gastroenterology: Proton Pump Inhibitors Passed - 06/12/2020  1:35 AM      Passed - Valid encounter within last 12 months    Recent Outpatient Visits          1 week ago Impacted cerumen of right ear   [facility] Delsa Grana, PA-C   1 week ago Otalgia, right   [facility] [place], [place], PA-C   5 months ago Right foot drop   [facility] Steele Sizer, MD   7 months ago Dyslipidemia   [facility] [place], Drue Stager, MD   1 year ago Hemiparesis affecting right side as late effect of cerebrovascular accident (CVA) [facility])   [facility] Steele Sizer, MD      Future Appointments            In 2 weeks Ancil Boozer, Drue Stager, MD [facility]

## 2020-06-19 ENCOUNTER — Other Ambulatory Visit: Payer: Self-pay | Admitting: Family Medicine

## 2020-06-19 DIAGNOSIS — M810 Age-related osteoporosis without current pathological fracture: Secondary | ICD-10-CM

## 2020-06-19 DIAGNOSIS — M109 Gout, unspecified: Secondary | ICD-10-CM

## 2020-06-19 NOTE — Telephone Encounter (Signed)
Requested Prescriptions  Pending Prescriptions Disp Refills   raloxifene (EVISTA) 60 MG tablet [Pharmacy Med Name: RALOXIFENE HCL 60 MG TABLET] 90 tablet 2    Sig: TAKE 1 TABLET BY MOUTH EVERY DAY     OB/GYN:  Selective Estrogen Receptor Modulators Passed - 06/19/2020  3:43 PM      Passed - Valid encounter within last 12 months    Recent Outpatient Visits          2 weeks ago Impacted cerumen of right ear   [facility] Delsa Grana, PA-C   2 weeks ago Otalgia, right   [facility], [place], PA-C   5 months ago Right foot drop   [facility] [place], Drue Stager, MD   8 months ago Dyslipidemia   [facility] [place], Drue Stager, MD   1 year ago Hemiparesis affecting right side as late effect of cerebrovascular accident (CVA) [facility])   [facility] Steele Sizer, MD      Future Appointments            In 1 week Steele Sizer, MD [facility], [facility]            allopurinol (ZYLOPRIM) 100 MG tablet [Pharmacy Med Name: ALLOPURINOL 100 MG TABLET] 90 tablet     Sig: TAKE 1 TABLET BY [place]     Endocrinology:  Gout Agents Failed - 06/19/2020  3:43 PM      Failed - Uric Acid in normal range and within 360 days    Uric Acid, Serum  Date Value Ref Range Status  06/02/2019 5.1 2.5 - 7.0 mg/dL Final    Comment:    Therapeutic target for gout patients: <6.0 mg/dL .    Uric Acid  Date Value Ref Range Status  10/16/2015 7.2 (H) 2.5 - 7.1 mg/dL Final    Comment:               Therapeutic target for gout patients: <6.0         Failed - Cr in normal range and within 360 days    Creat  Date Value Ref Range Status  06/02/2019 2.94 (H) 0.60 - 0.88 mg/dL Final    Comment:    For patients >62 years of age, the reference limit for Creatinine is approximately 13% higher for people identified as African-American. .    Creatinine, Ser  Date Value Ref Range Status   11/08/2019 3.44 (H) 0.44 - 1.00 mg/dL Final   Creatinine, Urine  Date Value Ref Range Status  06/02/2019 56 20 - 275 mg/dL Final         Passed - Valid encounter within last 12 months    Recent Outpatient Visits          2 weeks ago Impacted cerumen of right ear   [facility] Delsa Grana, PA-C   2 weeks ago Otalgia, right   [facility] [place], Kristeen Miss, PA-C   5 months ago Right foot drop   [facility] Steele Sizer, MD   8 months ago Dyslipidemia   [facility] [place], Drue Stager, MD   1 year ago Hemiparesis affecting right side as late effect of cerebrovascular accident (CVA) [facility])   [facility] Steele Sizer, MD      Future Appointments            In 1 week Steele Sizer, MD [facility],  [facility]

## 2020-06-29 ENCOUNTER — Ambulatory Visit (INDEPENDENT_AMBULATORY_CARE_PROVIDER_SITE_OTHER): Payer: Medicare HMO | Admitting: Family Medicine

## 2020-06-29 ENCOUNTER — Encounter: Payer: Self-pay | Admitting: Family Medicine

## 2020-06-29 VITALS — BP 122/68 | HR 72 | Temp 97.6°F | Resp 14 | Ht 60.0 in | Wt 113.5 lb

## 2020-06-29 DIAGNOSIS — E039 Hypothyroidism, unspecified: Secondary | ICD-10-CM

## 2020-06-29 DIAGNOSIS — E559 Vitamin D deficiency, unspecified: Secondary | ICD-10-CM

## 2020-06-29 DIAGNOSIS — N185 Chronic kidney disease, stage 5: Secondary | ICD-10-CM | POA: Diagnosis not present

## 2020-06-29 DIAGNOSIS — D692 Other nonthrombocytopenic purpura: Secondary | ICD-10-CM | POA: Diagnosis not present

## 2020-06-29 DIAGNOSIS — I208 Other forms of angina pectoris: Secondary | ICD-10-CM

## 2020-06-29 DIAGNOSIS — N2581 Secondary hyperparathyroidism of renal origin: Secondary | ICD-10-CM

## 2020-06-29 DIAGNOSIS — I5032 Chronic diastolic (congestive) heart failure: Secondary | ICD-10-CM

## 2020-06-29 DIAGNOSIS — I2510 Atherosclerotic heart disease of native coronary artery without angina pectoris: Secondary | ICD-10-CM | POA: Diagnosis not present

## 2020-06-29 DIAGNOSIS — I700 Atherosclerosis of aorta: Secondary | ICD-10-CM

## 2020-06-29 DIAGNOSIS — E785 Hyperlipidemia, unspecified: Secondary | ICD-10-CM

## 2020-06-29 DIAGNOSIS — I69351 Hemiplegia and hemiparesis following cerebral infarction affecting right dominant side: Secondary | ICD-10-CM | POA: Diagnosis not present

## 2020-06-29 DIAGNOSIS — I10 Essential (primary) hypertension: Secondary | ICD-10-CM

## 2020-06-29 DIAGNOSIS — M109 Gout, unspecified: Secondary | ICD-10-CM

## 2020-06-29 DIAGNOSIS — D638 Anemia in other chronic diseases classified elsewhere: Secondary | ICD-10-CM

## 2020-06-29 DIAGNOSIS — R4701 Aphasia: Secondary | ICD-10-CM | POA: Diagnosis not present

## 2020-06-29 DIAGNOSIS — I69322 Dysarthria following cerebral infarction: Secondary | ICD-10-CM

## 2020-06-29 NOTE — Progress Notes (Signed)
Name: Crystal Faulkner   MRN: [id number]    DOB: 01/15/1925   Date:07/02/2020       Progress Note  Subjective  Chief Complaint  Chief Complaint  Patient presents with  . Follow-up    I connected with  [name] on 07/02/20 at 11:20 AM EDT by telephone and verified that I am speaking with the correct person using two identifiers.  Started over the phone but patient came in to finish the visit in person   HPI  CVA : She had a CVA back in October 2017, she has dysarthria , rightside hemiparesisshe uses a walkerwhen at home but wheelchair when she goes out with family.Family has to crush her medications and mix in apple sauce. She does not speak, but nods her head and smiles.She points to body parts. Still under daughter's care. She lives with Pete Pelt ( one of her daughter's). she is stable   OA of both knees: she walks slowly, uses a walker, taking Tylenol for pain   Hypothyroidism: taking medication daily as prescribed, no constipation, she has dry skin.,taking levothyroxine 50 mcg daily, last TSH done 05/2019 and needs to come in for repeat labs   Senile purpura: bruises easily and is stable, on Plavix   HTN: ,she has ESRD and [facility] [place], bp has been well controlled at home  today is was 132/86   Angina: taking Imdur daily and denies chest pain, also taking statintherapy on plavix. She sees cardiologist - Dr. Saralyn Pilar, last visit 05/2019 , reminded daughter to schedule a follow up . Daughter's denies orthopnea or wheezing. She has Chronic diastolic CHF, she is unable to communicate verbally, so unsure if she is in pain but does not have any change in behavior   GERD:she is not longer taking   Dementia: stable, no wonderingand eats by herself,daughter is now bathing her,preparing her meals,needs help coming to the doctor and family member manages her money and dispense her medication. Shelives  with herdaughter [name] that hasretired and gives 24 hour care.    CKI with anemia or chronic disease: seeing nephrologist at [facility] , she  refuses HD, no pruritus.Reviewed last labs done 02/2020, GFR was 11, and Pth was elevated   Controlled Gout: no recent episodes, she takes allopurinol and no side effects  Atherosclerosis aorta:she is on statin therapy and Plavix   Patient Active Problem List   Diagnosis Date Noted  . Osteoarthritis 11/19/2017  . Dysarthria due to recent cerebrovascular accident (CVA) 06/23/2016  . Hemiparesis affecting right side as late effect of cerebrovascular accident (CVA) ([place]) 06/23/2016  . CKD (chronic kidney disease) stage 5, GFR less than 15 ml/min (HCC) 06/23/2016  . Senile purpura ([place]) 05/13/2016  . Hiatal hernia 05/04/2016  . Thoracic aorta atherosclerosis ([place]) 05/04/2016  . Chronic stable angina ([place]) 10/16/2015  . Secondary hyperparathyroidism ([place]) 04/13/2015  . Arteriosclerosis of coronary artery 04/08/2015  . Benign hypertension 04/08/2015  . Controlled gout 04/08/2015  . History of peptic ulcer disease 04/08/2015  . Adult hypothyroidism 04/08/2015  . Mild cognitive disorder 04/08/2015  . Anemia of chronic disease 04/08/2015  . Osteoarthrosis involving more than one site but not generalized 04/08/2015  . Osteoporosis 04/08/2015  . Pelvic muscle wasting 04/08/2015  . Allergic rhinitis 04/08/2015  . Chronic diastolic CHF (congestive heart failure) ([place]) 03/29/2014  . HLD (hyperlipidemia) 03/29/2014  . H/O gastrointestinal hemorrhage 04/05/2007    Past Surgical History:  Procedure Laterality Date  .  EXPLORATORY LAPAROTOMY    . EYE SURGERY Bilateral    cataract    Family History  Problem Relation Age of Onset  . Diabetes Sister   . Hypertension Sister   . Hypertension Brother   . Heart disease Brother     Social History   Socioeconomic History  . Marital status: Widowed    Spouse name: Not on file  . Number of  children: 6  . Years of education: Not on file  . Highest education level: 8th grade  Occupational History  . Occupation: Retired  Tobacco Use  . Smoking status: Never Smoker  . Smokeless tobacco: Former Systems developer    Types: Snuff  . Tobacco comment: smoking cessation materials not requuired  Vaping Use  . Vaping Use: Never used  Substance and Sexual Activity  . Alcohol use: No  . Drug use: No  . Sexual activity: Not Currently  Other Topics Concern  . Not on file  Social History Narrative   She lives with her daughter.    She also has her nephew and niece at home.    Had a stroke October 2017 and has difficulty walking and also unable to talk since.        Social Determinants of Health   Financial Resource Strain: Low Risk   . Difficulty of Paying Living Expenses: Not hard at all  Food Insecurity: No Food Insecurity  . Worried About Charity fundraiser in the Last Year: Never true  . Ran Out of Food in the Last Year: Never true  Transportation Needs: No Transportation Needs  . Lack of Transportation (Medical): No  . Lack of Transportation (Non-Medical): No  Physical Activity: Inactive  . Days of Exercise per Week: 0 days  . Minutes of Exercise per Session: 0 min  Stress: No Stress Concern Present  . Feeling of Stress : Not at all  Social Connections: Socially Isolated  . Frequency of Communication with Friends and Family: Never  . Frequency of Social Gatherings with Friends and Family: Twice a week  . Attends Religious Services: Never  . Active Member of Clubs or Organizations: No  . Attends Archivist Meetings: Never  . Marital Status: Widowed  Intimate Partner Violence: Not At Risk  . Fear of Current or Ex-Partner: No  . Emotionally Abused: No  . Physically Abused: No  . Sexually Abused: No     Current Outpatient Medications:  .  allopurinol (ZYLOPRIM) 100 MG tablet, TAKE 1 TABLET BY MOUTH EVERY DAY, Disp: 90 tablet, Rfl: 0 .  calcitRIOL (ROCALTROL) 0.25  MCG capsule, Take 0.25 mcg by mouth daily., Disp: , Rfl:  .  clopidogrel (PLAVIX) 75 MG tablet, TAKE 1 TABLET BY MOUTH EVERY DAY, Disp: 90 tablet, Rfl: 1 .  ferrous sulfate 325 (65 FE) MG tablet, Take 325 mg by mouth 2 (two) times daily with a meal., Disp: , Rfl:  .  isosorbide mononitrate (IMDUR) 60 MG 24 hr tablet, TAKE 1 TABLET BY MOUTH EVERY DAY, Disp: 90 tablet, Rfl: 2 .  levothyroxine (SYNTHROID) 50 MCG tablet, TAKE 1 TABLET (50 MCG TOTAL) BY MOUTH DAILY AT 6 (SIX) AM., Disp: 90 tablet, Rfl: 1 .  pantoprazole (PROTONIX) 40 MG tablet, TAKE 1 TABLET BY MOUTH EVERY DAY, Disp: 90 tablet, Rfl: 3 .  raloxifene (EVISTA) 60 MG tablet, TAKE 1 TABLET BY MOUTH EVERY DAY, Disp: 90 tablet, Rfl: 2 .  rosuvastatin (CRESTOR) 10 MG tablet, TAKE 1 TABLET BY MOUTH EVERY DAY, Disp: 90  tablet, Rfl: 1 .  sodium bicarbonate 650 MG tablet, Take 650 mg by mouth daily. , Disp: , Rfl: 11 .  furosemide (LASIX) 40 MG tablet, Take 1 tablet (40 mg total) by mouth every other day., Disp: 15 tablet, Rfl: 11  No Known Allergies  I personally reviewed active problem list, medication list, allergies, family history, social history, health maintenance with the patient/caregiver today.   ROS   Constitutional: Negative for fever or weight change.  Respiratory: Negative for cough and shortness of breath.   Cardiovascular: Negative for chest pain or palpitations.  Gastrointestinal: Negative for abdominal pain, no bowel changes.  Musculoskeletal: Positive for gait problem and right joint swelling.  Skin: Negative for rash.  Neurological: Negative for dizziness or headache.  No other specific complaints in a complete review of systems (except as listed in HPI above).  Objective  Today's Vitals   06/29/20 1144 07/02/20 1149  BP: 132/86 122/68  Pulse:  72  Resp:  14  Temp:  97.6 F (36.4 C)  TempSrc:  Oral  SpO2:  99%  Weight:  113 lb 8 oz (51.5 kg)  PainSc:  0-No pain   Body mass index is 22.17  kg/m.   Physical Exam  Constitutional: Patient appears well-developed and well-nourished.  No distress.  HEENT: head atraumatic, normocephalic, pupils equal and reactive to light, neck supple Cardiovascular: Normal rate, regular rhythm and normal heart sounds.  No murmur heard. No BLE edema. Pulmonary/Chest: Effort normal and breath sounds normal. No respiratory distress. Abdominal: Soft.  There is no tenderness. Muscular Skeletal: pain during palpation of right knee, some effusion , sitting on wheelchair  Psychiatric: Patient has a normal mood and affect. behavior is normal. Judgment and thought content normal.  PHQ2/9: Depression screen [facility] 2/9 06/29/2020 06/04/2020 05/30/2020 12/29/2019 11/22/2019  Decreased Interest 0 0 0 0 0  Down, Depressed, Hopeless 0 0 0 0 0  PHQ - 2 Score 0 0 0 0 0  Altered sleeping 0 0 - 0 -  Tired, decreased energy 0 0 - 0 -  Change in appetite 0 0 - 0 -  Feeling bad or failure about yourself  0 0 - 0 -  Trouble concentrating 0 0 - 0 -  Moving slowly or fidgety/restless 0 0 - 0 -  Suicidal thoughts 0 0 - 0 -  PHQ-9 Score 0 0 - 0 -  Difficult doing work/chores - - - - -  Some recent data might be hidden   PHQ-2/9 Result is negative.    Fall Risk: Fall Risk  06/29/2020 06/04/2020 05/30/2020 12/29/2019 11/22/2019  Falls in the past year? 0 1 1 0 0  Comment - - - - -  Number falls in past yr: 0 1 1 0 0  Injury with Fall? 0 0 1 0 0  Comment - - - - -  Risk Factor Category  - - - - -  Risk for fall due to : - - History of fall(s);Impaired balance/gait;Mental status change;Impaired mobility - Impaired balance/gait;Impaired mobility  Risk for fall due to: Comment - - - - -  Follow up - - - - Falls prevention discussed     Assessment & Plan  1. CKD (chronic kidney disease) stage 5, GFR less than 15 ml/min (HCC)  - COMPLETE METABOLIC PANEL WITH GFR  2. Anemia of chronic disease  - CBC with Differential/Platelet  3. Hemiparesis affecting right side as  late effect of cerebrovascular accident (CVA) ([place])   4. Adult hypothyroidism  -  TSH  5. Vitamin D deficiency   6. Arteriosclerosis of coronary artery  - Lipid panel - COMPLETE METABOLIC PANEL WITH GFR  7. Dysarthria as late effect of cerebrovascular accident (CVA)   8. Mutism   9. Dyslipidemia   10. Benign hypertension  At goal   11. Secondary hyperparathyroidism ([place])  We will recheck labs  12. Thoracic aorta atherosclerosis ([place])   13. Senile purpura (HCC)  Both arms   14. Controlled gout   15. Chronic stable angina (HCC)   16. Chronic diastolic CHF (congestive heart failure) ([place])

## 2020-07-02 ENCOUNTER — Ambulatory Visit: Payer: Medicare HMO

## 2020-07-02 ENCOUNTER — Other Ambulatory Visit: Payer: Self-pay

## 2020-07-02 ENCOUNTER — Encounter: Payer: Self-pay | Admitting: Family Medicine

## 2020-07-02 DIAGNOSIS — D638 Anemia in other chronic diseases classified elsewhere: Secondary | ICD-10-CM | POA: Diagnosis not present

## 2020-07-02 DIAGNOSIS — E039 Hypothyroidism, unspecified: Secondary | ICD-10-CM | POA: Diagnosis not present

## 2020-07-02 DIAGNOSIS — N185 Chronic kidney disease, stage 5: Secondary | ICD-10-CM | POA: Diagnosis not present

## 2020-07-02 DIAGNOSIS — I2510 Atherosclerotic heart disease of native coronary artery without angina pectoris: Secondary | ICD-10-CM | POA: Diagnosis not present

## 2020-07-03 LAB — COMPLETE METABOLIC PANEL WITH GFR
AG Ratio: 1.3 (calc) (ref 1.0–2.5)
ALT: 19 U/L (ref 6–29)
AST: 29 U/L (ref 10–35)
Albumin: 3.2 g/dL — ABNORMAL LOW (ref 3.6–5.1)
Alkaline phosphatase (APISO): 49 U/L (ref 37–153)
BUN/Creatinine Ratio: 9 (calc) (ref 6–22)
BUN: 31 mg/dL — ABNORMAL HIGH (ref 7–25)
CO2: 25 mmol/L (ref 20–32)
Calcium: 9.2 mg/dL (ref 8.6–10.4)
Chloride: 112 mmol/L — ABNORMAL HIGH (ref 98–110)
Creat: 3.31 mg/dL — ABNORMAL HIGH (ref 0.60–0.88)
GFR, Est African American: 13 mL/min/{1.73_m2} — ABNORMAL LOW (ref >=60)
GFR, Est Non African American: 11 mL/min/{1.73_m2} — ABNORMAL LOW (ref >=60)
Globulin: 2.5 g/dL (calc) (ref 1.9–3.7)
Glucose, Bld: 77 mg/dL (ref 65–99)
Potassium: 4.3 mmol/L (ref 3.5–5.3)
Sodium: 145 mmol/L (ref 135–146)
Total Bilirubin: 0.3 mg/dL (ref 0.2–1.2)
Total Protein: 5.7 g/dL — ABNORMAL LOW (ref 6.1–8.1)

## 2020-07-03 LAB — CBC WITH DIFFERENTIAL/PLATELET
Absolute Monocytes: 508 cells/uL (ref 200–950)
Basophils Absolute: 59 cells/uL (ref 0–200)
Basophils Relative: 0.9 %
Eosinophils Absolute: 191 cells/uL (ref 15–500)
Eosinophils Relative: 2.9 %
HCT: 29.8 % — ABNORMAL LOW (ref 35.0–45.0)
Hemoglobin: 9.7 g/dL — ABNORMAL LOW (ref 11.7–15.5)
Lymphs Abs: 1366 cells/uL (ref 850–3900)
MCH: 32 pg (ref 27.0–33.0)
MCHC: 32.6 g/dL (ref 32.0–36.0)
MCV: 98.3 fL (ref 80.0–100.0)
MPV: 10.5 fL (ref 7.5–12.5)
Monocytes Relative: 7.7 %
Neutro Abs: 4475 cells/uL (ref 1500–7800)
Neutrophils Relative %: 67.8 %
Platelets: 194 10*3/uL (ref 140–400)
RBC: 3.03 10*6/uL — ABNORMAL LOW (ref 3.80–5.10)
RDW: 13.5 % (ref 11.0–15.0)
Total Lymphocyte: 20.7 %
WBC: 6.6 10*3/uL (ref 3.8–10.8)

## 2020-07-03 LAB — TSH: TSH: 1.61 mIU/L (ref 0.40–4.50)

## 2020-07-03 LAB — LIPID PANEL
Cholesterol: 123 mg/dL (ref <200)
HDL: 58 mg/dL (ref >=50)
LDL Cholesterol (Calc): 49 mg/dL (calc)
Non-HDL Cholesterol (Calc): 65 mg/dL (calc) (ref <130)
Total CHOL/HDL Ratio: 2.1 (calc) (ref <5.0)
Triglycerides: 75 mg/dL (ref <150)

## 2020-07-26 DIAGNOSIS — I69320 Aphasia following cerebral infarction: Secondary | ICD-10-CM | POA: Diagnosis not present

## 2020-07-26 DIAGNOSIS — D631 Anemia in chronic kidney disease: Secondary | ICD-10-CM | POA: Diagnosis not present

## 2020-07-26 DIAGNOSIS — I120 Hypertensive chronic kidney disease with stage 5 chronic kidney disease or end stage renal disease: Secondary | ICD-10-CM | POA: Diagnosis not present

## 2020-07-26 DIAGNOSIS — N185 Chronic kidney disease, stage 5: Secondary | ICD-10-CM | POA: Diagnosis not present

## 2020-07-26 DIAGNOSIS — M1710 Unilateral primary osteoarthritis, unspecified knee: Secondary | ICD-10-CM | POA: Diagnosis not present

## 2020-07-26 DIAGNOSIS — E872 Acidosis: Secondary | ICD-10-CM | POA: Diagnosis not present

## 2020-07-26 DIAGNOSIS — N2581 Secondary hyperparathyroidism of renal origin: Secondary | ICD-10-CM | POA: Diagnosis not present

## 2020-08-28 ENCOUNTER — Other Ambulatory Visit: Payer: Self-pay | Admitting: Family Medicine

## 2020-08-28 DIAGNOSIS — I2510 Atherosclerotic heart disease of native coronary artery without angina pectoris: Secondary | ICD-10-CM

## 2020-08-28 DIAGNOSIS — E785 Hyperlipidemia, unspecified: Secondary | ICD-10-CM

## 2020-08-28 DIAGNOSIS — E039 Hypothyroidism, unspecified: Secondary | ICD-10-CM

## 2020-08-28 DIAGNOSIS — I69351 Hemiplegia and hemiparesis following cerebral infarction affecting right dominant side: Secondary | ICD-10-CM

## 2020-08-28 DIAGNOSIS — I69322 Dysarthria following cerebral infarction: Secondary | ICD-10-CM

## 2020-08-28 DIAGNOSIS — M109 Gout, unspecified: Secondary | ICD-10-CM

## 2020-08-28 DIAGNOSIS — I2089 Other forms of angina pectoris: Secondary | ICD-10-CM

## 2020-08-28 DIAGNOSIS — I208 Other forms of angina pectoris: Secondary | ICD-10-CM

## 2020-10-08 ENCOUNTER — Other Ambulatory Visit: Payer: Self-pay | Admitting: Family Medicine

## 2020-10-08 DIAGNOSIS — I208 Other forms of angina pectoris: Secondary | ICD-10-CM

## 2020-10-08 DIAGNOSIS — M109 Gout, unspecified: Secondary | ICD-10-CM

## 2020-10-08 NOTE — Telephone Encounter (Signed)
Medication Refill - Medication: allopurinol (ZYLOPRIM) 100 MG tablet   Pt is completely out of her current supply.   Has the patient contacted their pharmacy? Yes.   (Agent: If no, request that the patient contact the pharmacy for the refill.) (Agent: If yes, when and what did the pharmacy advise?)  Preferred Pharmacy (with phone number or street name):  [id number] [place] [place] [id number] Phone: [phone]: [phone]    Agent: Please be advised that RX refills may take up to 3 business days. We ask that you follow-up with your pharmacy.

## 2020-10-25 DIAGNOSIS — N185 Chronic kidney disease, stage 5: Secondary | ICD-10-CM | POA: Diagnosis not present

## 2020-10-29 ENCOUNTER — Other Ambulatory Visit: Payer: Self-pay

## 2020-10-29 DIAGNOSIS — N185 Chronic kidney disease, stage 5: Secondary | ICD-10-CM

## 2020-10-29 NOTE — Progress Notes (Unsigned)
rf

## 2020-10-31 DIAGNOSIS — M7989 Other specified soft tissue disorders: Secondary | ICD-10-CM | POA: Diagnosis not present

## 2020-11-01 DIAGNOSIS — N185 Chronic kidney disease, stage 5: Secondary | ICD-10-CM | POA: Diagnosis not present

## 2020-11-02 ENCOUNTER — Encounter: Payer: Self-pay | Admitting: Family Medicine

## 2020-11-05 ENCOUNTER — Other Ambulatory Visit: Payer: Self-pay | Admitting: Family Medicine

## 2020-11-05 DIAGNOSIS — M109 Gout, unspecified: Secondary | ICD-10-CM

## 2020-11-16 ENCOUNTER — Telehealth: Payer: Self-pay | Admitting: Family Medicine

## 2020-11-16 NOTE — Telephone Encounter (Signed)
Copied from [place] [id number]. Topic: Medicare AWV >> Nov 16, 2020 11:37 AM Cher Nakai R wrote: Reason for CRM:  Left message for patient to call back and schedule Medicare Annual Wellness Visit (AWV) in office.   If unable to come into the office for AWV,  please offer to do virtually or by telephone.  Last AWV: 11/22/2019  Please schedule at anytime with [place].  40 minute appointment  Any questions, please contact me at [phone]

## 2020-11-22 ENCOUNTER — Ambulatory Visit (INDEPENDENT_AMBULATORY_CARE_PROVIDER_SITE_OTHER): Payer: Medicare HMO

## 2020-11-22 DIAGNOSIS — Z0000 Encounter for general adult medical examination without abnormal findings: Secondary | ICD-10-CM | POA: Diagnosis not present

## 2020-11-22 NOTE — Progress Notes (Signed)
Name: Crystal Faulkner   MRN: [id number]    DOB: April 04, 1925   Date:11/23/2020       Progress Note  Subjective  Chief Complaint  Follow up   HPI   She came today with two daughters: Crystal Faulkner and Crystal Faulkner   CVA : She had a CVA back in October 2017, she has dysarthria , rightside hemiparesisshe uses a walkerwhen at home but wheelchair when she goes out with family, she has right foot drop and tried a brace but didn't seem to help.Family has to crush her medications and mix in apple sauce. She does not speak, but nods her head and smiles.She points to body parts. Still under daughter's care. She lives with Crystal Faulkner ( one of her daughter's). She has been stable.   OA of both knees: she walks slowly, uses a walker, taking Tylenol for pain , sitting on wheelchair at this time   Hypothyroidism: taking medication daily as prescribed, no constipation, she has dry skin,,taking levothyroxine 50 mcg daily, last TSH was at goal   Senile purpura: bruises easily, she takes Plavix. Reassurance given   HTN: ,she has ESRD and [facility] [place], daughter checks bp at home but not sure of values. It is under control today.   Angina: taking Imdur daily and denies chest pain, also taking statintherapy on plavix. She sees cardiologist - Dr. Saralyn Pilar, yearly, but missed appointment last year. . Daughter's denies orthopnea or wheezing. She has Chronic diastolic CHF, she is unable to communicate verbally, so unsure if she is in pain but does not have any change in behavior   GERD:she is taking pantoprazole daily, she does not like to complain, afraid to stop medication since she takes plavix and increase risk of bleeding.   Dementia: stable, no wonderingand eats by herself,daughter is now bathing her,preparing her meals,assistance with ambulation/has walker at home, needs help coming to the doctor and family member manages her money and  dispense her medication. Shelives with herdaughter Crystal Faulkner that hasretired and gives 24 hour care.    CKI ESRD:  with anemia or chronic disease and secondary hyperparathyroidism: seeing nephrologist at [facility] , she  refuses HD, no pruritus.Reviewed last labs done 10/2020 , GFR was 13 ( stable , she is still able to void. Albumin down to 3.2   Controlled Gout: no recent episodes, she takes allopurinol, nephrologist is aware , we will decrease dose of allopurinol to 50 mg daily   Atherosclerosis aorta:she is on statin therapy and Plavix   Malnutrition: she has lost weight, but good appetite, albumin a little low, taking ensure once a day, I will hand them a high calorie diet   Insomnia: she is not agitated, just takes short naps , gets up and sits on the edge of the bed, discussed sleep hygiene, risk of falls  Patient Active Problem List   Diagnosis Date Noted  . Osteoarthritis 11/19/2017  . Dysarthria due to recent cerebrovascular accident (CVA) 06/23/2016  . Hemiparesis affecting right side as late effect of cerebrovascular accident (CVA) ([place]) 06/23/2016  . CKD (chronic kidney disease) stage 5, GFR less than 15 ml/min (HCC) 06/23/2016  . Senile purpura ([place]) 05/13/2016  . Hiatal hernia 05/04/2016  . Thoracic aorta atherosclerosis ([place]) 05/04/2016  . Chronic stable angina ([place]) 10/16/2015  . Secondary hyperparathyroidism ([place]) 04/13/2015  . Arteriosclerosis of coronary artery 04/08/2015  . Benign hypertension 04/08/2015  . Controlled gout 04/08/2015  . History of peptic ulcer disease 04/08/2015  .  Adult hypothyroidism 04/08/2015  . Mild cognitive disorder 04/08/2015  . Anemia of chronic disease 04/08/2015  . Osteoarthrosis involving more than one site but not generalized 04/08/2015  . Osteoporosis 04/08/2015  . Pelvic muscle wasting 04/08/2015  . Allergic rhinitis 04/08/2015  . Chronic diastolic CHF (congestive heart failure) ([place]) 03/29/2014  . HLD (hyperlipidemia)  03/29/2014  . H/O gastrointestinal hemorrhage 04/05/2007    Past Surgical History:  Procedure Laterality Date  . EXPLORATORY LAPAROTOMY    . EYE SURGERY Bilateral    cataract    Family History  Problem Relation Age of Onset  . Diabetes Sister   . Hypertension Sister   . Hypertension Brother   . Heart disease Brother     Social History   Tobacco Use  . Smoking status: Never Smoker  . Smokeless tobacco: Former Systems developer    Types: Snuff  . Tobacco comment: smoking cessation materials not requuired  Substance Use Topics  . Alcohol use: No     Current Outpatient Medications:  .  allopurinol (ZYLOPRIM) 100 MG tablet, TAKE 1 TABLET BY MOUTH EVERY DAY, Disp: 30 tablet, Rfl: 0 .  calcitRIOL (ROCALTROL) 0.25 MCG capsule, Take 0.25 mcg by mouth daily., Disp: , Rfl:  .  clopidogrel (PLAVIX) 75 MG tablet, TAKE 1 TABLET BY MOUTH EVERY DAY, Disp: 90 tablet, Rfl: 1 .  ferrous sulfate 325 (65 FE) MG tablet, Take 325 mg by mouth 2 (two) times daily with a meal., Disp: , Rfl:  .  isosorbide mononitrate (IMDUR) 60 MG 24 hr tablet, TAKE 1 TABLET BY MOUTH EVERY DAY, Disp: 90 tablet, Rfl: 1 .  levothyroxine (SYNTHROID) 50 MCG tablet, TAKE 1 TABLET (50 MCG TOTAL) BY MOUTH DAILY AT 6 (SIX) AM., Disp: 90 tablet, Rfl: 1 .  pantoprazole (PROTONIX) 40 MG tablet, TAKE 1 TABLET BY MOUTH EVERY DAY, Disp: 90 tablet, Rfl: 3 .  raloxifene (EVISTA) 60 MG tablet, TAKE 1 TABLET BY MOUTH EVERY DAY, Disp: 90 tablet, Rfl: 2 .  rosuvastatin (CRESTOR) 10 MG tablet, TAKE 1 TABLET BY MOUTH EVERY DAY, Disp: 90 tablet, Rfl: 1 .  sodium bicarbonate 650 MG tablet, Take 650 mg by mouth daily. , Disp: , Rfl: 11 .  furosemide (LASIX) 40 MG tablet, Take 1 tablet (40 mg total) by mouth every other day., Disp: 15 tablet, Rfl: 11  No Known Allergies  I personally reviewed active problem list, medication list, allergies, family history, social history, health maintenance with the patient/caregiver today.   ROS  Constitutional:  Negative for fever , positive for weight change.  Respiratory: Negative for cough and shortness of breath.   Cardiovascular: Negative for chest pain or palpitations.  Gastrointestinal: Negative for abdominal pain, no bowel changes.  Musculoskeletal: positive  for gait problem and knee  joint swelling.  Skin: Negative for rash.  Neurological: Negative for dizziness or headache.  No other specific complaints in a complete review of systems (except as listed in HPI above).   Objective  Vitals:   11/23/20 1121  BP: 128/82  Pulse: 75  Resp: 16  Temp: 98.1 F (36.7 C)  TempSrc: Oral  SpO2: 95%  Weight: 100 lb (45.4 kg)  Height: 5' (1.524 m)    Body mass index is 19.53 kg/m.  Physical Exam  Constitutional: Patient appears well-developed , thin, in no distress HEENT: head atraumatic, normocephalic, pupils equal and reactive to light,  neck supple Cardiovascular: Normal rate, regular rhythm and normal heart sounds.  No murmur heard. No BLE edema. Pulmonary/Chest: Effort normal  and breath sounds normal. No respiratory distress. Abdominal: Soft.  There is no tenderness. Psychiatric:seems happy, non verbal   PHQ2/9: Depression screen [facility] 2/9 11/23/2020 11/22/2020 06/29/2020 06/04/2020 05/30/2020  Decreased Interest 0 0 0 0 0  Down, Depressed, Hopeless 0 0 0 0 0  PHQ - 2 Score 0 0 0 0 0  Altered sleeping - - 0 0 -  Tired, decreased energy - - 0 0 -  Change in appetite - - 0 0 -  Feeling bad or failure about yourself  - - 0 0 -  Trouble concentrating - - 0 0 -  Moving slowly or fidgety/restless - - 0 0 -  Suicidal thoughts - - 0 0 -  PHQ-9 Score - - 0 0 -  Difficult doing work/chores - - - - -  Some recent data might be hidden    phq 9 is negative   Fall Risk: Fall Risk  11/23/2020 11/22/2020 06/29/2020 06/04/2020 05/30/2020  Falls in the past year? 1 1 0 1 1  Comment - - - - -  Number falls in past yr: 0 0 0 1 1  Injury with Fall? 0 0 0 0 1  Comment - - - - -  Risk Factor  Category  - - - - -  Risk for fall due to : - Impaired balance/gait;Impaired mobility - - History of fall(s);Impaired balance/gait;Mental status change;Impaired mobility  Risk for fall due to: Comment - - - - -  Follow up - Falls prevention discussed - - -     Functional Status Survey: Is the patient deaf or have difficulty hearing?: No Does the patient have difficulty seeing, even when wearing glasses/contacts?: No Does the patient have difficulty concentrating, remembering, or making decisions?: No Does the patient have difficulty walking or climbing stairs?: Yes Does the patient have difficulty dressing or bathing?: Yes Does the patient have difficulty doing errands alone such as visiting a doctor's office or shopping?: Yes    Assessment & Plan  1. CKD (chronic kidney disease) stage 5, GFR less than 15 ml/min (HCC)  Not interested on HD  2. Adult hypothyroidism  Continue current dose of medications   3. Anemia of chronic disease  Reviewed recent labs done by nephrologist   4. Thoracic aorta atherosclerosis ([place])  On statin therapy   5. Vitamin D deficiency   6. Mutism   7. Dyslipidemia   8. Controlled gout  We will decrease dose and monitor for symptoms - allopurinol (ZYLOPRIM) 100 MG tablet; Take 0.5 tablets (50 mg total) by mouth daily.  Dispense: 45 tablet; Refill: 1  9. Arteriosclerosis of coronary artery   10. Dysarthria as late effect of cerebrovascular accident (CVA)   72. Benign hypertension   12. Senile purpura (HCC)  On arms and legs   13. Chronic diastolic CHF (congestive heart failure) (HCC)  Stable, no longer seeing cardiologist  14. Chronic stable angina (HCC)  On medical management only   15. Secondary hyperparathyroidism ([place])  Last level over 400   16. Gastroesophageal reflux disease without esophagitis  On PPI   17. Hemiparesis affecting right side as late effect of cerebrovascular accident (CVA) ([place])  And right  foot drop  18. Mild protein-calorie malnutrition ([place])  Discussed high calorie diet with caregivers, she seems to have a good appetite

## 2020-11-22 NOTE — Patient Instructions (Signed)
Crystal Faulkner , Thank you for taking time to come for your Medicare Wellness Visit. I appreciate your ongoing commitment to your health goals. Please review the following plan we discussed and let me know if I can assist you in the future.   Screening recommendations/referrals: Colonoscopy: no longer required Mammogram: no longer required Bone Density: no longer required Recommended yearly ophthalmology/optometry visit for glaucoma screening and checkup Recommended yearly dental visit for hygiene and checkup  Vaccinations: Influenza vaccine: done 05/30/20 Pneumococcal vaccine: done 05/11/15 Tdap vaccine: done 10/15/18 Shingles vaccine: Shingrix discussed. Please contact your pharmacy for coverage information.  Covid-19: done 11/11/19; please bring a copy of your vaccine card for booster information  Conditions/risks identified: Recommend continuing fall prevention in the home  Next appointment: Follow up in one year for your annual wellness visit    Preventive Care 65 Years and Older, Female Preventive care refers to lifestyle choices and visits with your health care provider that can promote health and wellness. What does preventive care include?  A yearly physical exam. This is also called an annual well check.  Dental exams once or twice a year.  Routine eye exams. Ask your health care provider how often you should have your eyes checked.  Personal lifestyle choices, including:  Daily care of your teeth and gums.  Regular physical activity.  Eating a healthy diet.  Avoiding tobacco and drug use.  Limiting alcohol use.  Practicing safe sex.  Taking low-dose aspirin every day.  Taking vitamin and mineral supplements as recommended by your health care provider. What happens during an annual well check? The services and screenings done by your health care provider during your annual well check will depend on your age, overall health, lifestyle risk factors, and family history  of disease. Counseling  Your health care provider may ask you questions about your:  Alcohol use.  Tobacco use.  Drug use.  Emotional well-being.  Home and relationship well-being.  Sexual activity.  Eating habits.  History of falls.  Memory and ability to understand (cognition).  Work and work Statistician.  Reproductive health. Screening  You may have the following tests or measurements:  Height, weight, and BMI.  Blood pressure.  Lipid and cholesterol levels. These may be checked every 5 years, or more frequently if you are over 63 years old.  Skin check.  Lung cancer screening. You may have this screening every year starting at age 50 if you have a 30-pack-year history of smoking and currently smoke or have quit within the past 15 years.  Fecal occult blood test (FOBT) of the stool. You may have this test every year starting at age 20.  Flexible sigmoidoscopy or colonoscopy. You may have a sigmoidoscopy every 5 years or a colonoscopy every 10 years starting at age 28.  Hepatitis C blood test.  Hepatitis B blood test.  Sexually transmitted disease (STD) testing.  Diabetes screening. This is done by checking your blood sugar (glucose) after you have not eaten for a while (fasting). You may have this done every 1-3 years.  Bone density scan. This is done to screen for osteoporosis. You may have this done starting at age 60.  Mammogram. This may be done every 1-2 years. Talk to your health care provider about how often you should have regular mammograms. Talk with your health care provider about your test results, treatment options, and if necessary, the need for more tests. Vaccines  Your health care provider may recommend certain vaccines, such as:  Influenza vaccine. This is recommended every year.  Tetanus, diphtheria, and acellular pertussis (Tdap, Td) vaccine. You may need a Td booster every 10 years.  Zoster vaccine. You may need this after age  7.  Pneumococcal 13-valent conjugate (PCV13) vaccine. One dose is recommended after age 54.  Pneumococcal polysaccharide (PPSV23) vaccine. One dose is recommended after age 41. Talk to your health care provider about which screenings and vaccines you need and how often you need them. This information is not intended to replace advice given to you by your health care provider. Make sure you discuss any questions you have with your health care provider. Document Released: 09/07/2015 Document Revised: 04/30/2016 Document Reviewed: 06/12/2015 Elsevier Interactive Patient Education  2017 [place] Prevention in the Home Falls can cause injuries. They can happen to people of all ages. There are many things you can do to make your home safe and to help prevent falls. What can I do on the outside of my home?  Regularly fix the edges of walkways and driveways and fix any cracks.  Remove anything that might make you trip as you walk through a door, such as a raised step or threshold.  Trim any bushes or trees on the path to your home.  Use bright outdoor lighting.  Clear any walking paths of anything that might make someone trip, such as rocks or tools.  Regularly check to see if handrails are loose or broken. Make sure that both sides of any steps have handrails.  Any raised decks and porches should have guardrails on the edges.  Have any leaves, snow, or ice cleared regularly.  Use sand or salt on walking paths during winter.  Clean up any spills in your garage right away. This includes oil or grease spills. What can I do in the bathroom?  Use night lights.  Install grab bars by the toilet and in the tub and shower. Do not use towel bars as grab bars.  Use non-skid mats or decals in the tub or shower.  If you need to sit down in the shower, use a plastic, non-slip stool.  Keep the floor dry. Clean up any water that spills on the floor as soon as it happens.  Remove  soap buildup in the tub or shower regularly.  Attach bath mats securely with double-sided non-slip rug tape.  Do not have throw rugs and other things on the floor that can make you trip. What can I do in the bedroom?  Use night lights.  Make sure that you have a light by your bed that is easy to reach.  Do not use any sheets or blankets that are too big for your bed. They should not hang down onto the floor.  Have a firm chair that has side arms. You can use this for support while you get dressed.  Do not have throw rugs and other things on the floor that can make you trip. What can I do in the kitchen?  Clean up any spills right away.  Avoid walking on wet floors.  Keep items that you use a lot in easy-to-reach places.  If you need to reach something above you, use a strong step stool that has a grab bar.  Keep electrical cords out of the way.  Do not use floor polish or wax that makes floors slippery. If you must use wax, use non-skid floor wax.  Do not have throw rugs and other things on the floor that  can make you trip. What can I do with my stairs?  Do not leave any items on the stairs.  Make sure that there are handrails on both sides of the stairs and use them. Fix handrails that are broken or loose. Make sure that handrails are as long as the stairways.  Check any carpeting to make sure that it is firmly attached to the stairs. Fix any carpet that is loose or worn.  Avoid having throw rugs at the top or bottom of the stairs. If you do have throw rugs, attach them to the floor with carpet tape.  Make sure that you have a light switch at the top of the stairs and the bottom of the stairs. If you do not have them, ask someone to add them for you. What else can I do to help prevent falls?  Wear shoes that:  Do not have high heels.  Have rubber bottoms.  Are comfortable and fit you well.  Are closed at the toe. Do not wear sandals.  If you use a  stepladder:  Make sure that it is fully opened. Do not climb a closed stepladder.  Make sure that both sides of the stepladder are locked into place.  Ask someone to hold it for you, if possible.  Clearly mark and make sure that you can see:  Any grab bars or handrails.  First and last steps.  Where the edge of each step is.  Use tools that help you move around (mobility aids) if they are needed. These include:  Canes.  Walkers.  Scooters.  Crutches.  Turn on the lights when you go into a dark area. Replace any light bulbs as soon as they burn out.  Set up your furniture so you have a clear path. Avoid moving your furniture around.  If any of your floors are uneven, fix them.  If there are any pets around you, be aware of where they are.  Review your medicines with your doctor. Some medicines can make you feel dizzy. This can increase your chance of falling. Ask your doctor what other things that you can do to help prevent falls. This information is not intended to replace advice given to you by your health care provider. Make sure you discuss any questions you have with your health care provider. Document Released: 06/07/2009 Document Revised: 01/17/2016 Document Reviewed: 09/15/2014 Elsevier Interactive Patient Education  2017 [employer].

## 2020-11-22 NOTE — Progress Notes (Signed)
Subjective:   Crystal Faulkner is a 87 y.o. female who presents for Medicare Annual (Subsequent) preventive examination.  Virtual Visit via Telephone Note  I connected with  Crystal Faulkner on 11/22/20 at  1:30 PM EDT by telephone and verified that I am speaking with the correct person using two identifiers.  Location: Patient: home Provider: [place] Persons participating in the virtual visit: patient & daughter [facility] Health Advisor   I discussed the limitations, risks, security and privacy concerns of performing an evaluation and management service by telephone and the availability of in person appointments. The patient expressed understanding and agreed to proceed.  Interactive audio and video telecommunications were attempted between this nurse and patient, however failed, due to patient having technical difficulties OR patient did not have access to video capability.  We continued and completed visit with audio only.  Some vital signs may be absent or patient reported.   Clemetine Marker, LPN   Review of Systems     Cardiac Risk Factors include: advanced age (>52mn, >>57women);hypertension;dyslipidemia;sedentary lifestyle     Objective:    There were no vitals filed for this visit. There is no height or weight on file to calculate BMI.  Advanced Directives 11/22/2020 11/22/2019 11/08/2019 08/26/2019 10/16/2018 10/15/2018 11/27/2017  Does Patient Have a Medical Advance Directive? Yes Yes Yes No Yes Yes Yes  Type of AParamedicof ADakota DunesLiving will HGen[place]Living will Healthcare Power of AFall[place]Living will  Does patient want to make changes to medical advance directive? - - No - Guardian declined - No - Patient declined - -  Copy of [place] Chart? Yes - validated most recent copy scanned in chart (See row information) No  - copy requested No - copy requested - Yes - validated most recent copy scanned in chart (See row information) - Yes  Would patient like information on creating a medical advance directive? - - - - - - -    Current Medications (verified) Outpatient Encounter Medications as of 11/22/2020  Medication Sig  . allopurinol (ZYLOPRIM) 100 MG tablet TAKE 1 TABLET BY MOUTH EVERY DAY  . calcitRIOL (ROCALTROL) 0.25 MCG capsule Take 0.25 mcg by mouth daily.  . clopidogrel (PLAVIX) 75 MG tablet TAKE 1 TABLET BY MOUTH EVERY DAY  . ferrous sulfate 325 (65 FE) MG tablet Take 325 mg by mouth 2 (two) times daily with a meal.  . isosorbide mononitrate (IMDUR) 60 MG 24 hr tablet TAKE 1 TABLET BY MOUTH EVERY DAY  . levothyroxine (SYNTHROID) 50 MCG tablet TAKE 1 TABLET (50 MCG TOTAL) BY MOUTH DAILY AT 6 (SIX) AM.  . pantoprazole (PROTONIX) 40 MG tablet TAKE 1 TABLET BY MOUTH EVERY DAY  . raloxifene (EVISTA) 60 MG tablet TAKE 1 TABLET BY MOUTH EVERY DAY  . rosuvastatin (CRESTOR) 10 MG tablet TAKE 1 TABLET BY MOUTH EVERY DAY  . sodium bicarbonate 650 MG tablet Take 650 mg by mouth daily.   . furosemide (LASIX) 40 MG tablet Take 1 tablet (40 mg total) by mouth every other day.   No facility-administered encounter medications on file as of 11/22/2020.    Allergies (verified) Patient has no known allergies.   History: Past Medical History:  Diagnosis Date  . Allergy   . Alzheimer's disease ([place]   . Anemia   . CCF (congestive cardiac failure) ([place]   . Chronic kidney disease   .  Chronic stable angina ([place])   . Frequent falls   . Gout   . H/O: GI bleed   . Hypercholesteremia   . Hyperglycemia   . Hyperlipidemia   . Hypertension   . Incontinence   . Mixed dementia ([place])   . Myocardial infarction ([place])   . Numbness of right hand   . Osteoporosis   . Right hand weakness   . Stroke ([place])   . Thyroid disease   . Vitamin D deficiency    Past Surgical History:  Procedure Laterality Date  .  EXPLORATORY LAPAROTOMY    . EYE SURGERY Bilateral    cataract   Family History  Problem Relation Age of Onset  . Diabetes Sister   . Hypertension Sister   . Hypertension Brother   . Heart disease Brother    Social History   Socioeconomic History  . Marital status: Widowed    Spouse name: Not on file  . Number of children: 6  . Years of education: Not on file  . Highest education level: 8th grade  Occupational History  . Occupation: Retired  Tobacco Use  . Smoking status: Never Smoker  . Smokeless tobacco: Former Systems developer    Types: Snuff  . Tobacco comment: smoking cessation materials not requuired  Vaping Use  . Vaping Use: Never used  Substance and Sexual Activity  . Alcohol use: No  . Drug use: No  . Sexual activity: Not Currently  Other Topics Concern  . Not on file  Social History Narrative   She lives with her daughter.    She also has her great nephew and niece at home.    Had a stroke October 2017 and has difficulty walking and also unable to talk since.        Social Determinants of Health   Financial Resource Strain: Low Risk   . Difficulty of Paying Living Expenses: Not hard at all  Food Insecurity: No Food Insecurity  . Worried About Charity fundraiser in the Last Year: Never true  . Ran Out of Food in the Last Year: Never true  Transportation Needs: No Transportation Needs  . Lack of Transportation (Medical): No  . Lack of Transportation (Non-Medical): No  Physical Activity: Inactive  . Days of Exercise per Week: 0 days  . Minutes of Exercise per Session: 0 min  Stress: No Stress Concern Present  . Feeling of Stress : Not at all  Social Connections: Socially Isolated  . Frequency of Communication with Friends and Family: Never  . Frequency of Social Gatherings with Friends and Family: Twice a week  . Attends Religious Services: Never  . Active Member of Clubs or Organizations: No  . Attends Archivist Meetings: Never  . Marital Status:  Widowed    Tobacco Counseling Counseling given: Not Answered Comment: smoking cessation materials not requuired   Clinical Intake:  Pre-visit preparation completed: Yes  Pain : No/denies pain     Nutritional Risks: None Diabetes: No  How often do you need to have someone help you when you read instructions, pamphlets, or other written materials from your doctor or pharmacy?: 5 - Always    Interpreter Needed?: No  Information entered by :: Clemetine Marker LPN   Activities of Daily Living In your present state of health, do you have any difficulty performing the following activities: 11/22/2020 06/29/2020  Hearing? N N  Comment declines hearing aids -  Vision? N N  Difficulty concentrating or making decisions? Darreld Mclean  Y  Walking or climbing stairs? Y Y  Dressing or bathing? Y Y  Doing errands, shopping? Tempie Donning  Preparing Food and eating ? Y -  Using the Toilet? N -  In the past six months, have you accidently leaked urine? Y -  Comment wears depends for protection -  Do you have problems with loss of bowel control? N -  Managing your Medications? Y -  Managing your Finances? Y -  Housekeeping or managing your Housekeeping? Y -  Some recent data might be hidden    Patient Care Team: Steele Sizer, MD as PCP - General (Family Medicine) Isaias Cowman, MD as Consulting Physician (Cardiology) Vladimir Crofts, MD as Consulting Physician (Neurology) Marian Sorrow (Nephrology) Edrick Kins, DPM as Consulting Physician (Podiatry)  Indicate any recent Medical Services you may have received from other than [facility] providers in the past year (date may be approximate).     Assessment:   This is a routine wellness examination for [name].  Hearing/Vision screen  Hearing Screening   '125Hz'$  '250Hz'$  '500Hz'$  '1000Hz'$  '2000Hz'$  '3000Hz'$  '4000Hz'$  '6000Hz'$  '8000Hz'$   Right ear:           Left ear:           Comments:  Pt denies hearing difficulty  Vision Screening Comments: Past due for eye exam  with Dr. Gwynn Burly; denies vision difficulty  Dietary issues and exercise activities discussed: Current Exercise Habits: The patient does not participate in regular exercise at present, Exercise limited by: orthopedic condition(s)  Goals    . Prevent falls     Recommend to remove any items from the home that may cause slips or trips.      Depression Screen PHQ 2/9 Scores 11/22/2020 06/29/2020 06/04/2020 05/30/2020 12/29/2019 11/22/2019 10/17/2019  PHQ - 2 Score 0 0 0 0 0 0 0  PHQ- 9 Score - 0 0 - 0 - 0    Fall Risk Fall Risk  11/22/2020 06/29/2020 06/04/2020 05/30/2020 12/29/2019  Falls in the past year? 1 0 1 1 0  Comment - - - - -  Number falls in past yr: 0 0 1 1 0  Injury with Fall? 0 0 0 1 0  Comment - - - - -  Risk Factor Category  - - - - -  Risk for fall due to : Impaired balance/gait;Impaired mobility - - History of fall(s);Impaired balance/gait;Mental status change;Impaired mobility -  Risk for fall due to: Comment - - - - -  Follow up Falls prevention discussed - - - -    FALL RISK PREVENTION PERTAINING TO THE HOME:  Any stairs in or around the home? Yes  If so, are there any without handrails? No  Home free of loose throw rugs in walkways, pet beds, electrical cords, etc? Yes  Adequate lighting in your home to reduce risk of falls? Yes   ASSISTIVE DEVICES UTILIZED TO PREVENT FALLS:  Life alert? No  Use of a cane, walker or w/c? Yes  Grab bars in the bathroom? No  Shower chair or bench in shower? Yes  Elevated toilet seat or a handicapped toilet? No   TIMED UP AND GO:  Was the test performed? No . Telephonic visit.   Cognitive Function: Cognitive status assessed by direct observation. Patient has current diagnosis of cognitive impairment.         Immunizations Immunization History  Administered Date(s) Administered  . Fluad Quad(high Dose 65+) 06/02/2019, 05/30/2020  . Influenza, High Dose Seasonal PF 05/13/2016,  06/15/2017, 06/08/2018  . Influenza, Seasonal,  Injecte, Preservative Fre 05/01/2010, 05/30/2011, 05/17/2012  . Influenza,inj,Quad PF,6+ Mos 05/26/2013, 06/12/2014, 05/11/2015  . Influenza-Unspecified 06/12/2014  . Janssen (J&J) SARS-COV-2 Vaccination 11/11/2019  . Pneumococcal Conjugate-13 05/11/2015  . Pneumococcal Polysaccharide-23 06/22/2006  . Tdap 10/16/2015, 10/15/2018  . Zoster 04/13/2015    TDAP status: Up to date  Flu Vaccine status: Up to date  Pneumococcal vaccine status: Up to date  Covid-19 vaccine status: Completed vaccines. Patient's daughter advised to bring copy of vaccine record to office for booster vaccine information.   Qualifies for Shingles Vaccine? Yes   Zostavax completed Yes   Shingrix Completed?: No.    Education has been provided regarding the importance of this vaccine. Patient has been advised to call insurance company to determine out of pocket expense if they have not yet received this vaccine. Advised may also receive vaccine at local pharmacy or Health Dept. Verbalized acceptance and understanding.  Screening Tests Health Maintenance  Topic Date Due  . COVID-19 Vaccine (2 - Booster for Janssen series) 01/06/2020  . TETANUS/TDAP  10/15/2028  . INFLUENZA VACCINE  Completed  . DEXA SCAN  Completed  . PNA vac Low Risk Adult  Completed  . HPV VACCINES  Aged Out    Health Maintenance  Health Maintenance Due  Topic Date Due  . COVID-19 Vaccine (2 - Booster for Janssen series) 01/06/2020    Colorectal cancer screening: No longer required.   Mammogram status: No longer required due to age.  Bone density screening: no longer required due to age.   Lung Cancer Screening: (Low Dose CT Chest recommended if Age 18-80 years, 30 pack-year currently smoking OR have quit w/in 15years.) does not qualify.   Additional Screening:  Hepatitis C Screening: does not qualify.   Vision Screening: Recommended annual ophthalmology exams for early detection of glaucoma and other disorders of the eye. Is  the patient up to date with their annual eye exam?  No  Who is the provider or what is the name of the office in which the patient attends annual eye exams? [place] Screening: Recommended annual dental exams for proper oral hygiene  Community Resource Referral / Chronic Care Management: CRR required this visit?  No   CCM required this visit?  No      Plan:     I have personally reviewed and noted the following in the patient's chart:   . Medical and social history . Use of alcohol, tobacco or illicit drugs  . Current medications and supplements . Functional ability and status . Nutritional status . Physical activity . Advanced directives . List of other physicians . Hospitalizations, surgeries, and ER visits in previous 12 months . Vitals . Screenings to include cognitive, depression, and falls . Referrals and appointments  In addition, I have reviewed and discussed with patient certain preventive protocols, quality metrics, and best practice recommendations. A written personalized care plan for preventive services as well as general preventive health recommendations were provided to patient.     Clemetine Marker, LPN   [id number]   Nurse Notes: none

## 2020-11-23 ENCOUNTER — Ambulatory Visit (INDEPENDENT_AMBULATORY_CARE_PROVIDER_SITE_OTHER): Payer: Medicare HMO | Admitting: Family Medicine

## 2020-11-23 ENCOUNTER — Other Ambulatory Visit: Payer: Self-pay

## 2020-11-23 ENCOUNTER — Encounter: Payer: Self-pay | Admitting: Family Medicine

## 2020-11-23 VITALS — BP 128/82 | HR 75 | Temp 98.1°F | Resp 16 | Ht 60.0 in | Wt 100.0 lb

## 2020-11-23 DIAGNOSIS — I69351 Hemiplegia and hemiparesis following cerebral infarction affecting right dominant side: Secondary | ICD-10-CM | POA: Diagnosis not present

## 2020-11-23 DIAGNOSIS — E559 Vitamin D deficiency, unspecified: Secondary | ICD-10-CM

## 2020-11-23 DIAGNOSIS — I2510 Atherosclerotic heart disease of native coronary artery without angina pectoris: Secondary | ICD-10-CM

## 2020-11-23 DIAGNOSIS — R4701 Aphasia: Secondary | ICD-10-CM | POA: Diagnosis not present

## 2020-11-23 DIAGNOSIS — I700 Atherosclerosis of aorta: Secondary | ICD-10-CM

## 2020-11-23 DIAGNOSIS — I208 Other forms of angina pectoris: Secondary | ICD-10-CM | POA: Diagnosis not present

## 2020-11-23 DIAGNOSIS — K219 Gastro-esophageal reflux disease without esophagitis: Secondary | ICD-10-CM

## 2020-11-23 DIAGNOSIS — M109 Gout, unspecified: Secondary | ICD-10-CM

## 2020-11-23 DIAGNOSIS — E785 Hyperlipidemia, unspecified: Secondary | ICD-10-CM | POA: Diagnosis not present

## 2020-11-23 DIAGNOSIS — I5032 Chronic diastolic (congestive) heart failure: Secondary | ICD-10-CM

## 2020-11-23 DIAGNOSIS — I69322 Dysarthria following cerebral infarction: Secondary | ICD-10-CM

## 2020-11-23 DIAGNOSIS — I10 Essential (primary) hypertension: Secondary | ICD-10-CM

## 2020-11-23 DIAGNOSIS — D692 Other nonthrombocytopenic purpura: Secondary | ICD-10-CM

## 2020-11-23 DIAGNOSIS — F0390 Unspecified dementia without behavioral disturbance: Secondary | ICD-10-CM | POA: Diagnosis not present

## 2020-11-23 DIAGNOSIS — E441 Mild protein-calorie malnutrition: Secondary | ICD-10-CM

## 2020-11-23 DIAGNOSIS — D638 Anemia in other chronic diseases classified elsewhere: Secondary | ICD-10-CM

## 2020-11-23 DIAGNOSIS — E039 Hypothyroidism, unspecified: Secondary | ICD-10-CM

## 2020-11-23 DIAGNOSIS — N2581 Secondary hyperparathyroidism of renal origin: Secondary | ICD-10-CM

## 2020-11-23 DIAGNOSIS — N185 Chronic kidney disease, stage 5: Secondary | ICD-10-CM

## 2020-11-23 MED ORDER — ALLOPURINOL 100 MG PO TABS: 50.0000 mg | ORAL_TABLET | Freq: Every day | ORAL | 1 refills | Status: DC

## 2020-11-29 ENCOUNTER — Other Ambulatory Visit: Payer: Self-pay | Admitting: Family Medicine

## 2020-11-29 DIAGNOSIS — M109 Gout, unspecified: Secondary | ICD-10-CM

## 2020-12-14 DIAGNOSIS — I214 Non-ST elevation (NSTEMI) myocardial infarction: Secondary | ICD-10-CM | POA: Diagnosis not present

## 2020-12-14 DIAGNOSIS — I2510 Atherosclerotic heart disease of native coronary artery without angina pectoris: Secondary | ICD-10-CM | POA: Diagnosis not present

## 2020-12-14 DIAGNOSIS — I5030 Unspecified diastolic (congestive) heart failure: Secondary | ICD-10-CM | POA: Diagnosis not present

## 2020-12-14 DIAGNOSIS — E7800 Pure hypercholesterolemia, unspecified: Secondary | ICD-10-CM | POA: Diagnosis not present

## 2020-12-14 DIAGNOSIS — R471 Dysarthria and anarthria: Secondary | ICD-10-CM | POA: Diagnosis not present

## 2020-12-14 DIAGNOSIS — R0602 Shortness of breath: Secondary | ICD-10-CM | POA: Diagnosis not present

## 2020-12-14 DIAGNOSIS — N185 Chronic kidney disease, stage 5: Secondary | ICD-10-CM | POA: Diagnosis not present

## 2020-12-14 DIAGNOSIS — I63311 Cerebral infarction due to thrombosis of right middle cerebral artery: Secondary | ICD-10-CM | POA: Diagnosis not present

## 2021-02-26 DIAGNOSIS — N185 Chronic kidney disease, stage 5: Secondary | ICD-10-CM | POA: Diagnosis not present

## 2021-03-04 DIAGNOSIS — N185 Chronic kidney disease, stage 5: Secondary | ICD-10-CM | POA: Diagnosis not present

## 2021-03-11 ENCOUNTER — Other Ambulatory Visit: Payer: Self-pay | Admitting: Family Medicine

## 2021-03-11 DIAGNOSIS — E039 Hypothyroidism, unspecified: Secondary | ICD-10-CM

## 2021-03-11 DIAGNOSIS — M810 Age-related osteoporosis without current pathological fracture: Secondary | ICD-10-CM

## 2021-05-08 ENCOUNTER — Telehealth: Payer: Self-pay | Admitting: Family Medicine

## 2021-05-08 DIAGNOSIS — E039 Hypothyroidism, unspecified: Secondary | ICD-10-CM

## 2021-05-08 DIAGNOSIS — M810 Age-related osteoporosis without current pathological fracture: Secondary | ICD-10-CM

## 2021-05-09 NOTE — Telephone Encounter (Signed)
Called and spoke to her daughter to schedule, she will call back once they know a date to bring pt

## 2021-05-27 ENCOUNTER — Ambulatory Visit: Payer: Medicare HMO | Admitting: Family Medicine

## 2021-06-03 ENCOUNTER — Encounter: Payer: Self-pay | Admitting: Family Medicine

## 2021-06-03 ENCOUNTER — Ambulatory Visit (INDEPENDENT_AMBULATORY_CARE_PROVIDER_SITE_OTHER): Payer: Medicare HMO | Admitting: Family Medicine

## 2021-06-03 ENCOUNTER — Ambulatory Visit: Payer: Medicare HMO | Admitting: Family Medicine

## 2021-06-03 ENCOUNTER — Other Ambulatory Visit: Payer: Self-pay

## 2021-06-03 VITALS — BP 118/72 | HR 88 | Temp 98.8°F | Resp 18 | Ht 60.0 in | Wt 110.7 lb

## 2021-06-03 DIAGNOSIS — E039 Hypothyroidism, unspecified: Secondary | ICD-10-CM | POA: Diagnosis not present

## 2021-06-03 DIAGNOSIS — M109 Gout, unspecified: Secondary | ICD-10-CM

## 2021-06-03 DIAGNOSIS — D692 Other nonthrombocytopenic purpura: Secondary | ICD-10-CM

## 2021-06-03 DIAGNOSIS — I208 Other forms of angina pectoris: Secondary | ICD-10-CM

## 2021-06-03 DIAGNOSIS — I69351 Hemiplegia and hemiparesis following cerebral infarction affecting right dominant side: Secondary | ICD-10-CM | POA: Diagnosis not present

## 2021-06-03 DIAGNOSIS — I10 Essential (primary) hypertension: Secondary | ICD-10-CM | POA: Diagnosis not present

## 2021-06-03 DIAGNOSIS — Z23 Encounter for immunization: Secondary | ICD-10-CM

## 2021-06-03 DIAGNOSIS — N185 Chronic kidney disease, stage 5: Secondary | ICD-10-CM | POA: Diagnosis not present

## 2021-06-03 DIAGNOSIS — E785 Hyperlipidemia, unspecified: Secondary | ICD-10-CM

## 2021-06-03 DIAGNOSIS — N2581 Secondary hyperparathyroidism of renal origin: Secondary | ICD-10-CM | POA: Diagnosis not present

## 2021-06-03 NOTE — Patient Instructions (Signed)
It was great to see you!  Our plans for today:  - Check with your pharmacy about the shingles vaccine.  - We are checking some labs today, we will release these results to your MyChart.  Take care and seek immediate care sooner if you develop any concerns.   Dr. Ky Barban

## 2021-06-03 NOTE — Assessment & Plan Note (Signed)
Maintains good control despite dose decrease of allopurinol, continue.

## 2021-06-03 NOTE — Assessment & Plan Note (Signed)
Recheck labs today. 

## 2021-06-03 NOTE — Assessment & Plan Note (Signed)
Tolerant of statin, may continue.

## 2021-06-03 NOTE — Assessment & Plan Note (Signed)
No current anginal symptoms. Continue to follow with cardiology.

## 2021-06-03 NOTE — Progress Notes (Signed)
   SUBJECTIVE:   CHIEF COMPLAINT / HPI:   Hypothyroidism - Medications: Synthroid 16mg - Current symptoms:  none - Denies change in energy level, diarrhea, and weight changes - Symptoms have been well-controlled  Hypertension, h/o CVA: - follows with Nephro, has ESRD - Medications: imdur, plavix, crestor, lasix eod,  - Compliance: good - Checking BP at home: sometimes, 110s SBP - Denies any medication SEs, or symptoms of hypotension  CHRONIC KIDNEY DISEASE - CKD V - follows with Nephro, has upcoming appt in November.   - with AoCD and 2ndary hyperparathyroidism - previously refused HD, still voiding. Last labs 02/2021 with GFR 10. - taking laxix eod Medications renally dose: yes Previous renal evaluation: yes Pneumovax:  Up to Date Influenza Vaccine:  Up to Date  Gout - on allopurinol '50mg'$  daily (renally dosed). Last uric acid 5.1 2020. No gout flares since dose decrease. Typically will get flares in feet. Last flare years ago. No current uremic symptoms.  Dementia - walks with walker at home. Wheelchair when out and about. Can feed herself. Needs assistance with bathing, dressing.   OBJECTIVE:   BP 118/72   Pulse 88   Temp 98.8 F (37.1 C) (Oral)   Resp 18   Ht 5' (1.524 m)   Wt 102 lb 4.8 oz (46.4 kg)   SpO2 97%   BMI 19.98 kg/m   Gen: elderly,frail, in NAD Card: RRR Lungs: CTAB Ext: WWP, trace edema to b/l feet   ASSESSMENT/PLAN:   Chronic stable angina (HCC) No current anginal symptoms. Continue to follow with cardiology.  Benign hypertension At goal without orthostatic symptoms, continue to follow with Cardiology and Nephrology.  CKD (chronic kidney disease) stage 5, GFR less than 15 ml/min (HCC) Continue to follow with Nephro.  HLD (hyperlipidemia) Tolerant of statin, may continue.  Controlled gout Maintains good control despite dose decrease of allopurinol, continue.  Adult hypothyroidism Recheck labs today.    AMyles Gip DO

## 2021-06-03 NOTE — Assessment & Plan Note (Signed)
At goal without orthostatic symptoms, continue to follow with Cardiology and Nephrology.

## 2021-06-03 NOTE — Assessment & Plan Note (Signed)
Continue to follow with Nephro.

## 2021-06-04 LAB — TSH: TSH: 2.24 mIU/L (ref 0.40–4.50)

## 2021-06-18 ENCOUNTER — Telehealth: Payer: Self-pay | Admitting: Family Medicine

## 2021-06-18 ENCOUNTER — Emergency Department: Payer: Medicare HMO

## 2021-06-18 ENCOUNTER — Ambulatory Visit: Payer: Self-pay | Admitting: *Deleted

## 2021-06-18 DIAGNOSIS — I5032 Chronic diastolic (congestive) heart failure: Secondary | ICD-10-CM | POA: Diagnosis not present

## 2021-06-18 DIAGNOSIS — N185 Chronic kidney disease, stage 5: Secondary | ICD-10-CM | POA: Insufficient documentation

## 2021-06-18 DIAGNOSIS — I132 Hypertensive heart and chronic kidney disease with heart failure and with stage 5 chronic kidney disease, or end stage renal disease: Secondary | ICD-10-CM | POA: Insufficient documentation

## 2021-06-18 DIAGNOSIS — Z79899 Other long term (current) drug therapy: Secondary | ICD-10-CM | POA: Insufficient documentation

## 2021-06-18 DIAGNOSIS — E039 Hypothyroidism, unspecified: Secondary | ICD-10-CM | POA: Diagnosis not present

## 2021-06-18 DIAGNOSIS — N3001 Acute cystitis with hematuria: Secondary | ICD-10-CM | POA: Diagnosis not present

## 2021-06-18 DIAGNOSIS — Z20822 Contact with and (suspected) exposure to covid-19: Secondary | ICD-10-CM | POA: Insufficient documentation

## 2021-06-18 DIAGNOSIS — Z87891 Personal history of nicotine dependence: Secondary | ICD-10-CM | POA: Insufficient documentation

## 2021-06-18 DIAGNOSIS — I10 Essential (primary) hypertension: Secondary | ICD-10-CM | POA: Diagnosis not present

## 2021-06-18 DIAGNOSIS — R4182 Altered mental status, unspecified: Secondary | ICD-10-CM | POA: Diagnosis not present

## 2021-06-18 LAB — COMPREHENSIVE METABOLIC PANEL
ALT: 18 U/L (ref 0–44)
AST: 21 U/L (ref 15–41)
Albumin: 3.4 g/dL — ABNORMAL LOW (ref 3.5–5.0)
Alkaline Phosphatase: 56 U/L (ref 38–126)
Anion gap: 6 (ref 5–15)
BUN: 49 mg/dL — ABNORMAL HIGH (ref 8–23)
CO2: 25 mmol/L (ref 22–32)
Calcium: 9.7 mg/dL (ref 8.9–10.3)
Chloride: 106 mmol/L (ref 98–111)
Creatinine, Ser: 3.7 mg/dL — ABNORMAL HIGH (ref 0.44–1.00)
GFR, Estimated: 11 mL/min — ABNORMAL LOW (ref >60)
Glucose, Bld: 123 mg/dL — ABNORMAL HIGH (ref 70–99)
Potassium: 4.4 mmol/L (ref 3.5–5.1)
Sodium: 137 mmol/L (ref 135–145)
Total Bilirubin: 0.6 mg/dL (ref 0.3–1.2)
Total Protein: 6.7 g/dL (ref 6.5–8.1)

## 2021-06-18 LAB — URINALYSIS, COMPLETE (UACMP) WITH MICROSCOPIC
Bilirubin Urine: NEGATIVE
Glucose, UA: NEGATIVE mg/dL
Hgb urine dipstick: NEGATIVE
Ketones, ur: NEGATIVE mg/dL
Nitrite: POSITIVE — AB
Protein, ur: NEGATIVE mg/dL
Specific Gravity, Urine: 1.005 (ref 1.005–1.030)
pH: 5 (ref 5.0–8.0)

## 2021-06-18 LAB — CBC
HCT: 31.2 % — ABNORMAL LOW (ref 36.0–46.0)
Hemoglobin: 10.1 g/dL — ABNORMAL LOW (ref 12.0–15.0)
MCH: 32.4 pg (ref 26.0–34.0)
MCHC: 32.4 g/dL (ref 30.0–36.0)
MCV: 100 fL (ref 80.0–100.0)
Platelets: 222 10*3/uL (ref 150–400)
RBC: 3.12 MIL/uL — ABNORMAL LOW (ref 3.87–5.11)
RDW: 14.6 % (ref 11.5–15.5)
WBC: 6.7 10*3/uL (ref 4.0–10.5)
nRBC: 0 % (ref 0.0–0.2)

## 2021-06-18 LAB — TROPONIN I (HIGH SENSITIVITY): Troponin I (High Sensitivity): 11 ng/L (ref <18)

## 2021-06-18 LAB — RESP PANEL BY RT-PCR (FLU A&B, COVID) ARPGX2
Influenza A by PCR: NEGATIVE
Influenza B by PCR: NEGATIVE
SARS Coronavirus 2 by RT PCR: NEGATIVE

## 2021-06-18 NOTE — ED Provider Notes (Signed)
Emergency Medicine Provider Triage Evaluation Note  Crystal Faulkner , a 87 y.o. female  was evaluated in triage.  Pt complains of confusion.  Patient is here with her daughter Crystal, advises that for about 2 days now patient does not quite seem herself they have noticed that she is not sleeping well she seems a little bit confused beyond her normal.  She does not speak due to previous stroke.  Daughter reports she does take a blood thinner.  No falls or known injuries.  Has had some similar symptoms in the past with a possible urinary tract infection.  Review of Systems  Positive: Confusion, not quite acting herself for about 2 days now not sleeping well Negative: Obvious pain or a fever.   EM caveat: Patient aphasic history provided through family  Physical Exam  BP (!) 143/102 (BP Location: Right Arm)   Pulse 86   Temp 97.7 F (36.5 C) (Oral)   Resp 17   SpO2 95%  Gen:   Awake, no distress   Resp:  Normal effort  Neuro: Alert, follows basic commands. Non-verbal (baseline per daughter from prior stroke) Other: edema, lower extremeties (chronic per daughter)  Medical Decision Making  Medically screening exam initiated at 5:29 PM.  Appropriate orders placed.  Crystal Faulkner was informed that the remainder of the evaluation will be completed by another provider, this initial triage assessment does not replace that evaluation, and the importance of remaining in the ED until their evaluation is complete.     Delman Kitten, MD 06/18/21 1734

## 2021-06-18 NOTE — Telephone Encounter (Signed)
Tried calling patient's daughter, Wilmer Berryhill @ [phone] (but call would not go through) to inform her that per Dr. Ancil Boozer it's very dangerous to treat without being seen, she could be septic, she needs to either come in or go to EC/urgent care tonight.

## 2021-06-18 NOTE — Telephone Encounter (Signed)
Summary: UTI/ confusion   Pts daughter called and asked for medication to be sent to pharmacy for a UTI / stated pt was up all night and is having issue with confusion / stated this usually happens when she gets a UTI/ they didn't want to bring her in due to age and states Dr. Ancil Boozer usually calls something in / pt is at home with Daughter Crystal Faulkner but was not with the daughter that called / please advise     Pt's daughter Crystal Faulkner calling. States pt with confusion, malodorous and cloudy urine, onset yesterday.Not sleeping well.  No fever. States "This is how she gets with a UTI.Dr. Ancil Boozer will usually call something in for pt, " as difficult to bring to practice. Assured daughter NT would route to practice for PCPs review and final disposition. If appropriate, please call in to [facility] on [address] for delivery to home.  CB# [phone] Reason for Disposition  Bad or foul-smelling urine  Answer Assessment - Initial Assessment Questions 1. SYMPTOM: "What's the main symptom you're concerned about?" (e.g., frequency, incontinence)     Confusion 2. ONSET: "When did the  *No Answer*  start?"     Yesterday 3. PAIN: "Is there any pain?" If Yes, ask: "How bad is it?" (Scale: 1-10; mild, moderate, severe)     no 4. CAUSE: "What do you think is causing the symptoms?"     UTI 5. OTHER SYMPTOMS: "Do you have any other symptoms?" (e.g., fever, flank pain, blood in urine, pain with urination)     Foul odor to urine, cloudy  Protocols used: Urinary Symptoms-A-AH

## 2021-06-18 NOTE — Telephone Encounter (Signed)
Pts daughter called and asked if Dr. Ancil Boozer can call in something for a [facility]TI to [facility] / please advise

## 2021-06-18 NOTE — ED Triage Notes (Signed)
Per pt daughter, pt has been altered for the past couple of days, not sleeping at night, states she is nonverbal since having previous stroke, face appears symmetrical..

## 2021-06-18 NOTE — ED Provider Notes (Signed)
Triage EKG reviewed at 1745 Heart rate 85 QRS 79 QTc 440 Some artifact due to patient motion.  Tach advised attempted multiple times to get EKG. Normal sinus rhythm, slight artifact.  No evidence of obvious acute ischemia denoted   Delman Kitten, MD 06/18/21 1744

## 2021-06-18 NOTE — Telephone Encounter (Signed)
Called and spoke to [place] to get mom scheduled for an appt. [name] states she will have to call back to schedule due to everyone being at work

## 2021-06-19 ENCOUNTER — Emergency Department
Admission: EM | Admit: 2021-06-19 | Discharge: 2021-06-19 | Disposition: A | Discharge status: Home / Self Care | Payer: Medicare HMO | Attending: Emergency Medicine | Admitting: Emergency Medicine

## 2021-06-19 DIAGNOSIS — N3001 Acute cystitis with hematuria: Secondary | ICD-10-CM

## 2021-06-19 MED ORDER — CEPHALEXIN 500 MG PO CAPS: 500.0000 mg | ORAL_CAPSULE | Freq: Three times a day (TID) | ORAL | 0 refills | Status: AC

## 2021-06-19 MED ORDER — CEFTRIAXONE SODIUM 1 G IJ SOLR
1.0000 g | Freq: Once | INTRAMUSCULAR | Status: AC
  Administered 2021-06-19: 1 g via INTRAMUSCULAR
  Filled 2021-06-19: qty 10

## 2021-06-19 NOTE — ED Provider Notes (Signed)
[facility] Emergency Department Provider Note  ____________________________________________  Time seen: Approximately 4:10 AM  I have reviewed the triage vital signs and the nursing notes.   HISTORY  Chief Complaint Altered Mental Status   HPI Crystal Faulkner is a 87 y.o. female with a history of advanced Alzheimer's dementia, CVA, nonverbal, hypertension, hyperlipidemia, frequent falls, chronic kidney disease, anemia who presents for evaluation of confusion.  Patient is accompanied by 2 daughters who provide history.  According to them patient has become slightly more agitated and moaning over the last 2 days.  Has been having difficulty sleeping at night.  Continues to be able to walk with assistant and with a walker.  Has had no cough or fever, no vomiting, no diarrhea, no difficulty breathing, no recent falls or trauma.  Patient has had similar symptoms in the past with a urinary tract infection.   Past Medical History:  Diagnosis Date   Allergy    Alzheimer's disease ([place])    Anemia    CCF (congestive cardiac failure) (HCC)    Chronic kidney disease    Chronic stable angina (HCC)    Frequent falls    Gout    H/O: GI bleed    Hypercholesteremia    Hyperglycemia    Hyperlipidemia    Hypertension    Incontinence    Mixed dementia (HCC)    Myocardial infarction (HCC)    Numbness of right hand    Osteoporosis    Right hand weakness    Stroke [facility])    Thyroid disease    Vitamin D deficiency     Patient Active Problem List   Diagnosis Date Noted   Osteoarthritis 11/19/2017   Dysarthria due to recent cerebrovascular accident (CVA) 06/23/2016   Hemiparesis affecting right side as late effect of cerebrovascular accident (CVA) ([place]) 06/23/2016   CKD (chronic kidney disease) stage 5, GFR less than 15 ml/min ([place]) 06/23/2016   Senile purpura ([place]) 05/13/2016   Hiatal hernia 05/04/2016   Thoracic aorta atherosclerosis ([place]) 05/04/2016   Chronic  stable angina ([place]) 10/16/2015   Secondary hyperparathyroidism ([place]) 04/13/2015   Arteriosclerosis of coronary artery 04/08/2015   Benign hypertension 04/08/2015   Controlled gout 04/08/2015   History of peptic ulcer disease 04/08/2015   Adult hypothyroidism 04/08/2015   Mild cognitive disorder 04/08/2015   Anemia of chronic disease 04/08/2015   Osteoarthrosis involving more than one site but not generalized 04/08/2015   Osteoporosis 04/08/2015   Pelvic muscle wasting 04/08/2015   Allergic rhinitis 04/08/2015   Chronic diastolic CHF (congestive heart failure) ([place]) 03/29/2014   HLD (hyperlipidemia) 03/29/2014   H/O gastrointestinal hemorrhage 04/05/2007    Past Surgical History:  Procedure Laterality Date   EXPLORATORY LAPAROTOMY     EYE SURGERY Bilateral    cataract    Prior to Admission medications   Medication Sig Start Date End Date Taking? Authorizing Provider  cephALEXin (KEFLEX) 500 MG capsule Take 1 capsule (500 mg total) by mouth 3 (three) times daily for 7 days. 06/19/21 06/26/21 Yes Renatha Rosen, [place], MD  allopurinol (ZYLOPRIM) 100 MG tablet Take 0.5 tablets (50 mg total) by mouth daily. 11/23/20   Steele Sizer, MD  calcitRIOL (ROCALTROL) 0.25 MCG capsule Take 0.25 mcg by mouth daily. 09/05/19   [provider]  clopidogrel (PLAVIX) 75 MG tablet TAKE 1 TABLET BY MOUTH EVERY DAY 08/28/20   Steele Sizer, MD  ferrous sulfate 325 (65 FE) MG tablet Take 325 mg by mouth 2 (two) times daily  with a meal.    [provider]  furosemide (LASIX) 40 MG tablet Take 1 tablet (40 mg total) by mouth every other day. 10/17/18 10/17/19  Gladstone Lighter, MD  isosorbide mononitrate (IMDUR) 60 MG 24 hr tablet TAKE 1 TABLET BY MOUTH EVERY DAY 10/08/20   Ancil Boozer, Drue Stager, MD  levothyroxine (SYNTHROID) 50 MCG tablet TAKE 1 TABLET (50 MCG TOTAL) BY MOUTH DAILY AT 6 (SIX) AM. 05/08/21   Steele Sizer, MD  pantoprazole (PROTONIX) 40 MG tablet TAKE 1 TABLET BY MOUTH EVERY DAY  06/12/20   Steele Sizer, MD  raloxifene (EVISTA) 60 MG tablet TAKE 1 TABLET BY MOUTH EVERY DAY 05/08/21   Ancil Boozer, Drue Stager, MD  rosuvastatin (CRESTOR) 10 MG tablet TAKE 1 TABLET BY MOUTH EVERY DAY 08/28/20   Steele Sizer, MD  sodium bicarbonate 650 MG tablet Take 650 mg by mouth daily.  03/19/15   [provider]    Allergies Patient has no known allergies.  Family History  Problem Relation Age of Onset   Diabetes Sister    Hypertension Sister    Hypertension Brother    Heart disease Brother     Social History Social History   Tobacco Use   Smoking status: Never   Smokeless tobacco: Former    Types: Snuff   Tobacco comments:    smoking cessation materials not requuired  Scientific laboratory technician Use: Never used  Substance Use Topics   Alcohol use: No   Drug use: No    Review of Systems  Constitutional: Negative for fever. + confusion Cardiovascular: Negative for chest pain. Respiratory: Negative for shortness of breath or cough Gastrointestinal: Negative for vomiting or diarrhea.  ____________________________________________   PHYSICAL EXAM:  VITAL SIGNS: ED Triage Vitals [06/18/21 1718]  Enc Vitals Group     BP (!) 143/102     Pulse Rate 86     Resp 17     Temp 97.7 F (36.5 C)     Temp Source Oral     SpO2 95 %     Weight      Height      Head Circumference      Peak Flow      Pain Score      Pain Loc      Pain Edu?      Excl. in [place]?     Constitutional: Awake, good eye contact, unable to follow commands but moving all extremities voluntarily. HEENT:      Head: Normocephalic and atraumatic.         Eyes: Conjunctivae are normal. Sclera is non-icteric.       Mouth/Throat: Mucous membranes are moist.       Neck: Supple with no signs of meningismus. Cardiovascular: Regular rate and rhythm. No murmurs, gallops, or rubs. 2+ symmetrical distal pulses are present in all extremities. No JVD. Respiratory: Normal respiratory effort. Lungs are clear  to auscultation bilaterally.  Gastrointestinal: Soft, non tender, and non distended with positive bowel sounds. No rebound or guarding. Genitourinary: No CVA tenderness. Musculoskeletal: 2+ pitting edema bilaterally Neurologic: Non verbal. Face is symmetric. Moving all extremities.  Skin: Skin is warm, dry and intact. No rash noted. Psychiatric: Mood and affect are normal. Speech and behavior are normal.  ____________________________________________   LABS (all labs ordered are listed, but only abnormal results are displayed)  Labs Reviewed  COMPREHENSIVE METABOLIC PANEL - Abnormal; Notable for the following components:      Result Value   Glucose, Bld 123 (*)  BUN 49 (*)    Creatinine, Ser 3.70 (*)    Albumin 3.4 (*)    GFR, Estimated 11 (*)    All other components within normal limits  CBC - Abnormal; Notable for the following components:   RBC 3.12 (*)    Hemoglobin 10.1 (*)    HCT 31.2 (*)    All other components within normal limits  URINALYSIS, COMPLETE (UACMP) WITH MICROSCOPIC - Abnormal; Notable for the following components:   Color, Urine YELLOW (*)    APPearance CLOUDY (*)    Nitrite POSITIVE (*)    Leukocytes,Ua SMALL (*)    Bacteria, UA RARE (*)    All other components within normal limits  RESP PANEL BY RT-PCR (FLU A&B, COVID) ARPGX2  CBG MONITORING, ED  CBG MONITORING, ED  TROPONIN I (HIGH SENSITIVITY)   ____________________________________________  EKG  ED ECG REPORT I, Rudene Re, the attending physician, personally viewed and interpreted this ECG.  Sinus rhythm with a rate of 85, left axis deviation, anterior Q waves, no ST elevations or depressions.  Somewhat limited evaluation due to artifact but seems unchanged when compared to prior. ____________________________________________  RADIOLOGY  I have personally reviewed the images performed during this visit and I agree with the Radiologist's read.   Interpretation by Radiologist:  CT  Head Wo Contrast  Result Date: 06/18/2021 CLINICAL DATA:  Altered mental status EXAM: CT HEAD WITHOUT CONTRAST TECHNIQUE: Contiguous axial images were obtained from the base of the skull through the vertex without intravenous contrast. COMPARISON:  CT head dated August 27, 2019 FINDINGS: Brain: No evidence of acute infarction, hemorrhage, hydrocephalus, extra-axial collection or mass lesion/mass effect. Encephalomalacia of the left frontal and parietal lobes with ex-vacuo dilatation of the left frontal horn. Multiple lacunar infarcts in bilateral basal ganglia. Low-attenuation of the periventricular and subcortical white matter representing chronic microvascular ischemic changes. Vascular: Bilateral atherosclerotic calcification of carotid siphons Skull: Normal. Negative for fracture or focal lesion. Sinuses/Orbits: No acute finding. Other: None. IMPRESSION: No acute intracranial abnormality. Chronic left MCA territory infarct and advanced chronic microvascular ischemic changes of the white matter, unchanged. Electronically Signed   By: Keane Police D.O.   On: 06/18/2021 18:31     ____________________________________________   PROCEDURES  Procedure(s) performed: None Procedures   Critical Care performed:  None ____________________________________________   INITIAL IMPRESSION / ASSESSMENT AND PLAN / ED COURSE   87 y.o. female with a history of advanced Alzheimer's dementia, CVA, nonverbal, hypertension, hyperlipidemia, frequent falls, chronic kidney disease, anemia who presents for evaluation of confusion x 2 days.  Patient is nonverbal and has been moaning and not sleeping well over the last 2 days.  Daughters are here with patient and provide most of the history.  They are concerned that she might have a UTI.  Her face is symmetric, she is moving all extremities, she has good eye contact, she does moan intermittently.  She is in no respiratory distress, her lungs are clear to auscultation.  Her  abdomen is soft and nontender.  She has 2+ pitting edema of her bilateral lower extremities which according to her daughters is normal especially since unfortunately she has been sitting in triage for 11 hours to be seen.  She does have a UTI with no signs of sepsis.  Stable creatinine, no significant electrolyte derangements, no white count, no tachycardia, no fever.  Mild stable anemia.  EKG is unchanged from baseline.  Troponin is normal.  COVID and flu negative.  Head CT showing stable  chronic left MCA infarct with no acute findings.  At this point after waiting 11 hours we still have a bed for patient and therefore she was seen in triage.  The daughters are pretty adamant about taking her home.  They feel confident that her change in mentation is due to a UTI as he has happened before.  With no signs of sepsis and no neurological deficits on exam other than patient speech and I believe that is reasonable to take patient home at this time.  Patient was given a dose of IM Rocephin and will be discharged home on Keflex.  I did review patient's prior chart and prior results of her urine cultures which grew E.coli sensitive to both Rocephin and Keflex.  I recommended close follow-up with primary care doctor and recommend return to the emergency room if she has a fever or if she starts vomiting or if she becomes even more confused.  Old medical records were reviewed.      _____________________________________________ Please note:  Patient was evaluated in Emergency Department today for the symptoms described in the history of present illness. Patient was evaluated in the context of the global COVID-19 pandemic, which necessitated consideration that the patient might be at risk for infection with the SARS-CoV-2 virus that causes COVID-19. Institutional protocols and algorithms that pertain to the evaluation of patients at risk for COVID-19 are in a state of rapid change based on information released by  regulatory bodies including the CDC and federal and state organizations. These policies and algorithms were followed during the patient's care in the ED.  Some ED evaluations and interventions may be delayed as a result of limited staffing during the pandemic.   [place] Controlled Substance Database was reviewed by me. ____________________________________________   FINAL CLINICAL IMPRESSION(S) / ED DIAGNOSES   Final diagnoses:  Acute cystitis with hematuria      NEW MEDICATIONS STARTED DURING THIS VISIT:  ED Discharge Orders          Ordered    cephALEXin (KEFLEX) 500 MG capsule  3 times daily        06/19/21 0408             Note:  This document was prepared using Dragon voice recognition software and may include unintentional dictation errors.    Rudene Re, MD 06/19/21 [phone]

## 2021-06-19 NOTE — ED Notes (Signed)
Patient discharged to home per MD order. Patient in stable condition, and deemed medically cleared by ED provider for discharge. Discharge instructions reviewed with patient/family using "Teach Back"; verbalized understanding of medication education and administration, and information about follow-up care. Denies further concerns. ° °

## 2021-06-29 ENCOUNTER — Other Ambulatory Visit: Payer: Self-pay

## 2021-06-29 ENCOUNTER — Encounter: Payer: Self-pay | Admitting: Emergency Medicine

## 2021-06-29 DIAGNOSIS — R55 Syncope and collapse: Secondary | ICD-10-CM | POA: Diagnosis not present

## 2021-06-29 DIAGNOSIS — Z7902 Long term (current) use of antithrombotics/antiplatelets: Secondary | ICD-10-CM | POA: Diagnosis not present

## 2021-06-29 DIAGNOSIS — J101 Influenza due to other identified influenza virus with other respiratory manifestations: Secondary | ICD-10-CM | POA: Diagnosis not present

## 2021-06-29 DIAGNOSIS — R2243 Localized swelling, mass and lump, lower limb, bilateral: Secondary | ICD-10-CM | POA: Insufficient documentation

## 2021-06-29 DIAGNOSIS — I5032 Chronic diastolic (congestive) heart failure: Secondary | ICD-10-CM | POA: Diagnosis not present

## 2021-06-29 DIAGNOSIS — I132 Hypertensive heart and chronic kidney disease with heart failure and with stage 5 chronic kidney disease, or end stage renal disease: Secondary | ICD-10-CM | POA: Diagnosis not present

## 2021-06-29 DIAGNOSIS — Z20822 Contact with and (suspected) exposure to covid-19: Secondary | ICD-10-CM | POA: Diagnosis not present

## 2021-06-29 DIAGNOSIS — E039 Hypothyroidism, unspecified: Secondary | ICD-10-CM | POA: Diagnosis not present

## 2021-06-29 DIAGNOSIS — F0390 Unspecified dementia without behavioral disturbance: Secondary | ICD-10-CM | POA: Diagnosis not present

## 2021-06-29 DIAGNOSIS — N185 Chronic kidney disease, stage 5: Secondary | ICD-10-CM | POA: Diagnosis not present

## 2021-06-29 DIAGNOSIS — Z5321 Procedure and treatment not carried out due to patient leaving prior to being seen by health care provider: Secondary | ICD-10-CM | POA: Diagnosis not present

## 2021-06-29 DIAGNOSIS — R059 Cough, unspecified: Secondary | ICD-10-CM | POA: Diagnosis not present

## 2021-06-29 DIAGNOSIS — K449 Diaphragmatic hernia without obstruction or gangrene: Secondary | ICD-10-CM | POA: Diagnosis not present

## 2021-06-29 DIAGNOSIS — T17920A Food in respiratory tract, part unspecified causing asphyxiation, initial encounter: Secondary | ICD-10-CM | POA: Diagnosis not present

## 2021-06-29 NOTE — ED Triage Notes (Signed)
Pt presents to ER from home accompanied by daughter who reports pt has kidney problems and has both lower legs and feet swollen. Pt has pitting edema +3 on feet, left lower leg is weeping currently wrapped per daughter.  Pt is calm, non-verbal

## 2021-06-30 ENCOUNTER — Emergency Department
Admission: EM | Admit: 2021-06-30 | Discharge: 2021-06-30 | Disposition: A | Discharge status: Home / Self Care | Payer: Medicare HMO | Attending: Emergency Medicine | Admitting: Emergency Medicine

## 2021-07-02 ENCOUNTER — Other Ambulatory Visit: Payer: Self-pay | Admitting: Family Medicine

## 2021-07-02 DIAGNOSIS — K219 Gastro-esophageal reflux disease without esophagitis: Secondary | ICD-10-CM

## 2021-07-03 ENCOUNTER — Telehealth: Payer: Self-pay

## 2021-07-03 NOTE — Telephone Encounter (Signed)
Crystal Faulkner will get [name] to call and schedule this apt

## 2021-07-03 NOTE — Telephone Encounter (Signed)
Tried to eplain to patient what was going on but didn't let me finish reading notes, Please call back to explain further

## 2021-07-03 NOTE — Telephone Encounter (Signed)
[name] has been informed that the forms have been filled out and faxed.

## 2021-07-10 ENCOUNTER — Other Ambulatory Visit: Payer: Self-pay | Admitting: Family Medicine

## 2021-07-10 DIAGNOSIS — M109 Gout, unspecified: Secondary | ICD-10-CM

## 2021-07-10 NOTE — Telephone Encounter (Signed)
Caller would like PCP to know patient will run out tomorrow. Advised called please allow 48 to 72 hour turn around time.

## 2021-07-11 DIAGNOSIS — D638 Anemia in other chronic diseases classified elsewhere: Secondary | ICD-10-CM | POA: Diagnosis not present

## 2021-07-11 DIAGNOSIS — N185 Chronic kidney disease, stage 5: Secondary | ICD-10-CM | POA: Diagnosis not present

## 2021-07-11 DIAGNOSIS — I10 Essential (primary) hypertension: Secondary | ICD-10-CM | POA: Diagnosis not present

## 2021-07-23 ENCOUNTER — Other Ambulatory Visit: Payer: Self-pay

## 2021-07-23 ENCOUNTER — Emergency Department
Admission: EM | Admit: 2021-07-23 | Discharge: 2021-07-23 | Disposition: A | Discharge status: Home / Self Care | Payer: Medicare HMO | Attending: Emergency Medicine | Admitting: Emergency Medicine

## 2021-07-23 ENCOUNTER — Emergency Department: Payer: Medicare HMO

## 2021-07-23 DIAGNOSIS — Z7902 Long term (current) use of antithrombotics/antiplatelets: Secondary | ICD-10-CM | POA: Insufficient documentation

## 2021-07-23 DIAGNOSIS — I5032 Chronic diastolic (congestive) heart failure: Secondary | ICD-10-CM | POA: Insufficient documentation

## 2021-07-23 DIAGNOSIS — J101 Influenza due to other identified influenza virus with other respiratory manifestations: Secondary | ICD-10-CM | POA: Diagnosis not present

## 2021-07-23 DIAGNOSIS — N185 Chronic kidney disease, stage 5: Secondary | ICD-10-CM | POA: Insufficient documentation

## 2021-07-23 DIAGNOSIS — E039 Hypothyroidism, unspecified: Secondary | ICD-10-CM | POA: Diagnosis not present

## 2021-07-23 DIAGNOSIS — Z20822 Contact with and (suspected) exposure to covid-19: Secondary | ICD-10-CM | POA: Diagnosis not present

## 2021-07-23 DIAGNOSIS — Z79899 Other long term (current) drug therapy: Secondary | ICD-10-CM | POA: Diagnosis not present

## 2021-07-23 DIAGNOSIS — Z87891 Personal history of nicotine dependence: Secondary | ICD-10-CM | POA: Insufficient documentation

## 2021-07-23 DIAGNOSIS — I2510 Atherosclerotic heart disease of native coronary artery without angina pectoris: Secondary | ICD-10-CM | POA: Insufficient documentation

## 2021-07-23 DIAGNOSIS — R0989 Other specified symptoms and signs involving the circulatory and respiratory systems: Secondary | ICD-10-CM

## 2021-07-23 DIAGNOSIS — I132 Hypertensive heart and chronic kidney disease with heart failure and with stage 5 chronic kidney disease, or end stage renal disease: Secondary | ICD-10-CM | POA: Diagnosis not present

## 2021-07-23 DIAGNOSIS — T17920A Food in respiratory tract, part unspecified causing asphyxiation, initial encounter: Secondary | ICD-10-CM | POA: Diagnosis not present

## 2021-07-23 DIAGNOSIS — I959 Hypotension, unspecified: Secondary | ICD-10-CM | POA: Diagnosis not present

## 2021-07-23 DIAGNOSIS — K449 Diaphragmatic hernia without obstruction or gangrene: Secondary | ICD-10-CM | POA: Diagnosis not present

## 2021-07-23 DIAGNOSIS — R2243 Localized swelling, mass and lump, lower limb, bilateral: Secondary | ICD-10-CM | POA: Diagnosis not present

## 2021-07-23 DIAGNOSIS — R55 Syncope and collapse: Secondary | ICD-10-CM | POA: Diagnosis not present

## 2021-07-23 DIAGNOSIS — F0390 Unspecified dementia without behavioral disturbance: Secondary | ICD-10-CM | POA: Diagnosis not present

## 2021-07-23 DIAGNOSIS — R059 Cough, unspecified: Secondary | ICD-10-CM | POA: Diagnosis not present

## 2021-07-23 LAB — RESP PANEL BY RT-PCR (FLU A&B, COVID) ARPGX2
Influenza A by PCR: POSITIVE — AB
Influenza B by PCR: NEGATIVE
SARS Coronavirus 2 by RT PCR: NEGATIVE

## 2021-07-23 MED ORDER — BENZONATATE 100 MG PO CAPS: 100.0000 mg | ORAL_CAPSULE | Freq: Four times a day (QID) | ORAL | 0 refills | Status: DC | PRN

## 2021-07-23 NOTE — ED Provider Notes (Signed)
[facility] Emergency Department Provider Note ____________________________________________   Event Date/Time   First MD Initiated Contact with Patient 07/23/21 1557     (approximate)  I have reviewed the triage vital signs and the nursing notes.  HISTORY  Chief Complaint No chief complaint on file.   HPI Crystal Faulkner is a 87 y.o. femalewho presents to the ED for evaluation of choking episode and syncope.  Chart review indicates CKD 5 with GFR around 10.  Lives at home with her daughters who help take care of her.  She is nonverbal due to history of strokes.  Largely bedbound these days. Her daughter, Crystal Faulkner, is one of our patient registration employees and was working today.  She comes over and is present at the bedside during my evaluation, and she assists with history acquisition.  Patient is nonverbal, limiting history. Crystal Faulkner got a call from her sister today that patient had a choking episode while eating a banana without her teeth in.  She reportedly was choking on this banana, and ultimately may have passed out, and 911 was called. Patient reportedly has been sick with cough and congestion for the past 2 days prior to this.  No known fevers or other events such as falls or other episodes of passing out.   Past Medical History:  Diagnosis Date   Allergy    Alzheimer's disease ([employer])    Anemia    CCF (congestive cardiac failure) (HCC)    Chronic kidney disease    Chronic stable angina (HCC)    Frequent falls    Gout    H/O: GI bleed    Hypercholesteremia    Hyperglycemia    Hyperlipidemia    Hypertension    Incontinence    Mixed dementia (HCC)    Myocardial infarction (HCC)    Numbness of right hand    Osteoporosis    Right hand weakness    Stroke [facility])    Thyroid disease    Vitamin D deficiency     Patient Active Problem List   Diagnosis Date Noted   Osteoarthritis 11/19/2017   Dysarthria due to recent cerebrovascular  accident (CVA) 06/23/2016   Hemiparesis affecting right side as late effect of cerebrovascular accident (CVA) ([place]) 06/23/2016   CKD (chronic kidney disease) stage 5, GFR less than 15 ml/min ([place]) 06/23/2016   Senile purpura ([place]) 05/13/2016   Hiatal hernia 05/04/2016   Thoracic aorta atherosclerosis ([place]) 05/04/2016   Chronic stable angina ([place]) 10/16/2015   Secondary hyperparathyroidism ([place]) 04/13/2015   Arteriosclerosis of coronary artery 04/08/2015   Benign hypertension 04/08/2015   Controlled gout 04/08/2015   History of peptic ulcer disease 04/08/2015   Adult hypothyroidism 04/08/2015   Mild cognitive disorder 04/08/2015   Anemia of chronic disease 04/08/2015   Osteoarthrosis involving more than one site but not generalized 04/08/2015   Osteoporosis 04/08/2015   Pelvic muscle wasting 04/08/2015   Allergic rhinitis 04/08/2015   Chronic diastolic CHF (congestive heart failure) ([place]) 03/29/2014   HLD (hyperlipidemia) 03/29/2014   H/O gastrointestinal hemorrhage 04/05/2007    Past Surgical History:  Procedure Laterality Date   EXPLORATORY LAPAROTOMY     EYE SURGERY Bilateral    cataract    Prior to Admission medications   Medication Sig Start Date End Date Taking? Authorizing Provider  benzonatate (TESSALON PERLES) 100 MG capsule Take 1 capsule (100 mg total) by mouth every 6 (six) hours as needed for cough. 07/23/21 07/23/22 Yes Vladimir Crofts, MD  allopurinol (ZYLOPRIM) 100  MG tablet TAKE 1 TABLET BY MOUTH EVERY DAY 07/10/21   Steele Sizer, MD  calcitRIOL (ROCALTROL) 0.25 MCG capsule Take 0.25 mcg by mouth daily. 09/05/19   [provider]  clopidogrel (PLAVIX) 75 MG tablet TAKE 1 TABLET BY MOUTH EVERY DAY 08/28/20   Steele Sizer, MD  ferrous sulfate 325 (65 FE) MG tablet Take 325 mg by mouth 2 (two) times daily with a meal.    [provider]  furosemide (LASIX) 40 MG tablet Take 1 tablet (40 mg total) by mouth every other day. 10/17/18 10/17/19   Gladstone Lighter, MD  isosorbide mononitrate (IMDUR) 60 MG 24 hr tablet TAKE 1 TABLET BY MOUTH EVERY DAY 10/08/20   Ancil Boozer, Drue Stager, MD  levothyroxine (SYNTHROID) 50 MCG tablet TAKE 1 TABLET (50 MCG TOTAL) BY MOUTH DAILY AT 6 (SIX) AM. 05/08/21   Steele Sizer, MD  pantoprazole (PROTONIX) 40 MG tablet TAKE 1 TABLET BY MOUTH EVERY DAY 07/02/21   Steele Sizer, MD  raloxifene (EVISTA) 60 MG tablet TAKE 1 TABLET BY MOUTH EVERY DAY 05/08/21   Ancil Boozer, Drue Stager, MD  rosuvastatin (CRESTOR) 10 MG tablet TAKE 1 TABLET BY MOUTH EVERY DAY 08/28/20   Steele Sizer, MD  sodium bicarbonate 650 MG tablet Take 650 mg by mouth daily.  03/19/15   [provider]    Allergies Patient has no known allergies.  Family History  Problem Relation Age of Onset   Diabetes Sister    Hypertension Sister    Hypertension Brother    Heart disease Brother     Social History Social History   Tobacco Use   Smoking status: Never   Smokeless tobacco: Former    Types: Snuff   Tobacco comments:    smoking cessation materials not requuired  Scientific laboratory technician Use: Never used  Substance Use Topics   Alcohol use: No   Drug use: No    Review of Systems  Unable to be accurately assessed due to patient's nonverbal status. ____________________________________________   PHYSICAL EXAM:  VITAL SIGNS: Vitals:   07/23/21 1700 07/23/21 1800  BP: 120/71 134/68  Pulse: 75 72  Resp: 17 18  Temp:    SpO2: 96% 97%      Constitutional: Alert without distress.  Frail and bedbound. Eyes: Conjunctivae are normal. PERRL. EOMI. Head: Atraumatic. Nose: Mild clear congestion/rhinnorhea. Mouth/Throat: Mucous membranes are moist.  Oropharynx non-erythematous. Edentulous. No evidence of stridor or respiratory distress. Neck: No stridor. No cervical spine tenderness to palpation. Cardiovascular: Normal rate, regular rhythm. Grossly normal heart sounds.  Good peripheral circulation. Respiratory: Normal  respiratory effort.  No retractions. Lungs CTAB. Gastrointestinal: Soft , nondistended, nontender to palpation. No CVA tenderness. Musculoskeletal: No lower extremity tenderness nor edema.  No joint effusions. No signs of acute trauma. Neurologic:  Normal speech and language. No gross focal neurologic deficits are appreciated. No gait instability noted. Skin:  Skin is warm, dry and intact. No rash noted. Psychiatric: Mood and affect are normal. Speech and behavior are normal.  ____________________________________________   LABS (all labs ordered are listed, but only abnormal results are displayed)  Labs Reviewed  RESP PANEL BY RT-PCR (FLU A&B, COVID) ARPGX2 - Abnormal; Notable for the following components:      Result Value   Influenza A by PCR POSITIVE (*)    All other components within normal limits   ____________________________________________  12 Lead EKG   ____________________________________________  RADIOLOGY  ED MD interpretation:  CXR reviewed by me without evidence of acute cardiopulmonary  pathology.  Official radiology report(s): DG Chest 2 View  Result Date: 07/23/2021 CLINICAL DATA:  Cough. EXAM: CHEST - 2 VIEW COMPARISON:  October 15, 2018. FINDINGS: Stable cardiomediastinal silhouette. Large hiatal hernia is noted. Both lungs are clear. The visualized skeletal structures are unremarkable. IMPRESSION: No acute cardiopulmonary disease.  Large hiatal hernia. Aortic Atherosclerosis (ICD10-I70.0). Electronically Signed   By: Marijo Conception M.D.   On: 07/23/2021 16:57    ____________________________________________   PROCEDURES and INTERVENTIONS  Procedure(s) performed (including Critical Care):  Procedures  Medications - No data to display  ____________________________________________   MDM / ED COURSE   87 year old female comes into the ED after a choking episode, found of evidence of influenza A, and ultimately amenable to outpatient management in the  custody of her daughters.  Normal vitals on room air.  No evidence of respiratory distress, CAP or cardiac dysrhythmia.  We will provide recommendations for supportive care at home and return precautions for the ED.     ____________________________________________   FINAL CLINICAL IMPRESSION(S) / ED DIAGNOSES  Final diagnoses:  Choking episode  Influenza A     ED Discharge Orders          Ordered    benzonatate (TESSALON PERLES) 100 MG capsule  Every 6 hours PRN        07/23/21 1810             Avonna Iribe   Note:  This document was prepared using Dragon voice recognition software and may include unintentional dictation errors.    Vladimir Crofts, MD 07/23/21 [phone]

## 2021-07-23 NOTE — Discharge Instructions (Addendum)
Use Tylenol for pain and fevers.  Up to 1000 mg per dose, up to 4 times per day.  Do not take more than 4000 mg of Tylenol/acetaminophen within 24 hours..  

## 2021-07-23 NOTE — ED Triage Notes (Signed)
Pt presents to ED from home via [facility]. EMS reports pt choked on a banana. Family also reports she passed out. Pt has hx of stroke and has some right sided deficits

## 2021-07-23 NOTE — ED Notes (Signed)
Pt cleaned up. New chuck pad placed, clothes changed. NAD noted

## 2021-07-29 ENCOUNTER — Encounter: Payer: Self-pay | Admitting: Internal Medicine

## 2021-07-29 ENCOUNTER — Ambulatory Visit: Payer: Self-pay

## 2021-07-29 ENCOUNTER — Ambulatory Visit (INDEPENDENT_AMBULATORY_CARE_PROVIDER_SITE_OTHER): Payer: Medicare HMO | Admitting: Internal Medicine

## 2021-07-29 VITALS — BP 106/62 | HR 82 | Temp 97.6°F | Resp 18 | Ht 60.0 in

## 2021-07-29 DIAGNOSIS — R062 Wheezing: Secondary | ICD-10-CM

## 2021-07-29 DIAGNOSIS — S60222A Contusion of left hand, initial encounter: Secondary | ICD-10-CM

## 2021-07-29 DIAGNOSIS — J111 Influenza due to unidentified influenza virus with other respiratory manifestations: Secondary | ICD-10-CM | POA: Diagnosis not present

## 2021-07-29 MED ORDER — METHYLPREDNISOLONE 4 MG PO TBPK: ORAL_TABLET | ORAL | 0 refills | Status: DC

## 2021-07-29 NOTE — Progress Notes (Signed)
Acute Office Visit  Subjective:    Patient ID: Crystal Faulkner, female    DOB: 1925-07-24, 87 y.o.   MRN: [id number]  Chief Complaint  Patient presents with   Shortness of Breath    And wheezing no better since being dx with flu last Tuesday   Hand Injury    Swollen and bruised, EMS might of hit it?    HPI Patient is in today for wheezing.  She is accompanied by her 2 daughters, who provide the history.  She was seen in the emergency department and diagnosed with flu last week.  Chest x-ray was obtained, which was negative.  She was given Ladona Ridgel to take as needed for cough, however the patient has a hard time swallowing these pills.  Her daughter state that she is progressively improving, however she still has a dry cough and appears short of breath when she walks to the bathroom. They are also concerned about her right hand that is bruised since she went to the ER, they believe EMS may have hit it accidentally. There has been a bruise over the hand.    Flu last week. [place] helped.   -Worst symptom: cough -Fever: no -Cough: yes, dry  -Shortness of breath: yes, sob with exertion -Wheezing: yes -Chest congestion: yes -Nasal congestion: yes -Runny nose: yes -Post nasal drip: yes -Sneezing: no -Vomiting: no  -Context: better -Relief with OTC cold/cough medications: yes  -Treatments attempted: cold/sinus and cough syrup    Past Medical History:  Diagnosis Date   Allergy    Alzheimer's disease ([place])    Anemia    CCF (congestive cardiac failure) (HCC)    Chronic kidney disease    Chronic stable angina (HCC)    Frequent falls    Gout    H/O: GI bleed    Hypercholesteremia    Hyperglycemia    Hyperlipidemia    Hypertension    Incontinence    Mixed dementia ([place])    Myocardial infarction (HCC)    Numbness of right hand    Osteoporosis    Right hand weakness    Stroke ([place])    Thyroid disease    Vitamin D deficiency     Past Surgical History:   Procedure Laterality Date   EXPLORATORY LAPAROTOMY     EYE SURGERY Bilateral    cataract    Family History  Problem Relation Age of Onset   Diabetes Sister    Hypertension Sister    Hypertension Brother    Heart disease Brother     Social History   Socioeconomic History   Marital status: Widowed    Spouse name: Not on file   Number of children: 6   Years of education: Not on file   Highest education level: 8th grade  Occupational History   Occupation: Retired  Tobacco Use   Smoking status: Never   Smokeless tobacco: Former    Types: Snuff   Tobacco comments:    smoking cessation materials not requuired  Scientific laboratory technician Use: Never used  Substance and Sexual Activity   Alcohol use: No   Drug use: No   Sexual activity: Not Currently  Other Topics Concern   Not on file  Social History Narrative   She lives with her daughter.    She also has her great nephew and niece at home.    Had a stroke October 2017 and has difficulty walking and also unable to talk since.  Social Determinants of Health   Financial Resource Strain: Low Risk    Difficulty of Paying Living Expenses: Not hard at all  Food Insecurity: No Food Insecurity   Worried About Charity fundraiser in the Last Year: Never true   [place] in the Last Year: Never true  Transportation Needs: No Transportation Needs   Lack of Transportation (Medical): No   Lack of Transportation (Non-Medical): No  Physical Activity: Inactive   Days of Exercise per Week: 0 days   Minutes of Exercise per Session: 0 min  Stress: No Stress Concern Present   Feeling of Stress : Not at all  Social Connections: Socially Isolated   Frequency of Communication with Friends and Family: Never   Frequency of Social Gatherings with Friends and Family: Twice a week   Attends Religious Services: Never   Marine scientist or Organizations: No   Attends Archivist Meetings: Never   Marital Status:  Widowed  Human resources officer Violence: Not At Risk   Fear of Current or Ex-Partner: No   Emotionally Abused: No   Physically Abused: No   Sexually Abused: No    Outpatient Medications Prior to Visit  Medication Sig Dispense Refill   allopurinol (ZYLOPRIM) 100 MG tablet TAKE 1 TABLET BY MOUTH EVERY DAY 90 tablet 1   amLODipine (NORVASC) 5 MG tablet amlodipine 5 mg tablet     benzonatate (TESSALON PERLES) 100 MG capsule Take 1 capsule (100 mg total) by mouth every 6 (six) hours as needed for cough. 30 capsule 0   calcitRIOL (ROCALTROL) 0.25 MCG capsule Take 0.25 mcg by mouth daily.     clopidogrel (PLAVIX) 75 MG tablet TAKE 1 TABLET BY MOUTH EVERY DAY 90 tablet 1   ferrous sulfate 325 (65 FE) MG tablet Take 325 mg by mouth 2 (two) times daily with a meal.     furosemide (LASIX) 40 MG tablet Take 1 tablet (40 mg total) by mouth every other day. 15 tablet 11   isosorbide mononitrate (IMDUR) 60 MG 24 hr tablet TAKE 1 TABLET BY MOUTH EVERY DAY 90 tablet 1   levothyroxine (SYNTHROID) 50 MCG tablet TAKE 1 TABLET (50 MCG TOTAL) BY MOUTH DAILY AT 6 (SIX) AM. 90 tablet 0   pantoprazole (PROTONIX) 40 MG tablet TAKE 1 TABLET BY MOUTH EVERY DAY 90 tablet 3   raloxifene (EVISTA) 60 MG tablet TAKE 1 TABLET BY MOUTH EVERY DAY 90 tablet 0   rosuvastatin (CRESTOR) 10 MG tablet TAKE 1 TABLET BY MOUTH EVERY DAY 90 tablet 1   sodium bicarbonate 650 MG tablet Take 650 mg by mouth daily.   11   No facility-administered medications prior to visit.    No Known Allergies  Review of Systems  Constitutional:  Negative for chills and fever.  HENT:  Positive for congestion. Negative for rhinorrhea.   Respiratory:  Positive for cough, shortness of breath and wheezing.   Gastrointestinal:  Negative for diarrhea, nausea and vomiting.  Hematological:  Bruises/bleeds easily.      Objective:    Physical Exam Constitutional:      Appearance: She is well-developed.  HENT:     Head: Normocephalic and atraumatic.      Nose: Congestion present.  Eyes:     Conjunctiva/sclera: Conjunctivae normal.  Cardiovascular:     Rate and Rhythm: Normal rate and regular rhythm.  Pulmonary:     Effort: Pulmonary effort is normal.     Breath sounds: Wheezing present.  Comments: Inspiratory wheezes at the bases Musculoskeletal:        General: Swelling present. No tenderness. Normal range of motion.     Right lower leg: No edema.     Left lower leg: No edema.     Comments: Left hand slightly swollen with a large hematoma. Good range of motion of digits and wrist, no point tenderness.   Skin:    General: Skin is warm and dry.  Neurological:     General: No focal deficit present.     Mental Status: She is alert. Mental status is at baseline.  Psychiatric:        Mood and Affect: Mood normal.        Behavior: Behavior normal.    BP 106/62   Pulse 82   Temp 97.6 F (36.4 C)   Resp 18   Ht 5' (1.524 m)   SpO2 98%   BMI 22.26 kg/m  Wt Readings from Last 3 Encounters:  07/23/21 114 lb (51.7 kg)  06/29/21 110 lb (49.9 kg)  06/03/21 110 lb 11.2 oz (50.2 kg)    Health Maintenance Due  Topic Date Due   Zoster Vaccines- Shingrix (1 of 2) Never done   COVID-19 Vaccine (3 - Booster for Janssen series) 09/28/2020    There are no preventive care reminders to display for this patient.   Lab Results  Component Value Date   TSH 2.24 06/03/2021   Lab Results  Component Value Date   WBC 6.7 06/18/2021   HGB 10.1 (L) 06/18/2021   HCT 31.2 (L) 06/18/2021   MCV 100.0 06/18/2021   PLT 222 06/18/2021   Lab Results  Component Value Date   NA 137 06/18/2021   K 4.4 06/18/2021   CO2 25 06/18/2021   GLUCOSE 123 (H) 06/18/2021   BUN 49 (H) 06/18/2021   CREATININE 3.70 (H) 06/18/2021   BILITOT 0.6 06/18/2021   ALKPHOS 56 06/18/2021   AST 21 06/18/2021   ALT 18 06/18/2021   PROT 6.7 06/18/2021   ALBUMIN 3.4 (L) 06/18/2021   CALCIUM 9.7 06/18/2021   ANIONGAP 6 06/18/2021   Lab Results  Component  Value Date   CHOL 123 07/02/2020   Lab Results  Component Value Date   HDL 58 07/02/2020   Lab Results  Component Value Date   LDLCALC 49 07/02/2020   Lab Results  Component Value Date   TRIG 75 07/02/2020   Lab Results  Component Value Date   CHOLHDL 2.1 07/02/2020   Lab Results  Component Value Date   HGBA1C 5.6 06/22/2016       Assessment & Plan:   1. URI due to influenza/Wheezes: Recovering from flu, still having post-viral dry cough with wheezes. Can continue OTC cough medication, medrol dosepack will be sent for wheezes and continues respiratory symptoms. FU as needed.   - methylPREDNISolone (MEDROL DOSEPAK) 4 MG TBPK tablet; Day 1: Take 8 mg (2 tablets) before breakfast, 4 mg (1 tablet) after lunch, 4 mg (1 tablet) after supper, and 8 mg (2 tablets) at bedtime. Day 2:Take 4 mg (1 tablet) before breakfast, 4 mg (1 tablet) after lunch, 4 mg (1 tablet) after supper, and 8 mg (2 tablets) at bedtime. Day 3: Take 4 mg (1 tablet) before breakfast, 4 mg (1 tablet) after lunch, 4 mg (1 tablet) after supper, and 4 mg (1 tablet) at bedtime. Day 4: Take 4 mg (1 tablet) before breakfast, 4 mg (1 tablet) after lunch, and 4 mg (1 tablet)  at bedtime. Day 5: Take 4 mg (1 tablet) before breakfast and 4 mg (1 tablet) at bedtime. Day 6: Take 4 mg (1 tablet) before breakfast.  Dispense: 1 each; Refill: 0  2. Traumatic hematoma of left hand, initial encounter: Large hematoma of hand, good ROM without point tenderness, still using hand. No x-ray indicated at this time.    Teodora Medici, DO

## 2021-07-29 NOTE — Patient Instructions (Addendum)
It was great seeing you today!  Plan discussed at today's visit: -Continue cough medication as needed -Steroid pack sent to pharmacy to help with wheezing  Follow up in: as needed.   Take care and let us know if you have any questions or concerns prior to your next visit.  Dr. Rosana Berger

## 2021-07-29 NOTE — Telephone Encounter (Signed)
Trouble with swallowing cough med pills. Pt is drinking fluids well.Cough is better and dry.   Cough is better  Dx w/flu Gatorade and pedialyte SOB with activity new started last Tuesday.   Nasal drainage is clear No fever Asking for abx and cough syrup. Prefers phone visit. Warm transferred to office for appt.  Reason for Disposition  [1] MILD difficulty breathing (e.g., minimal/no SOB at rest, SOB with walking, pulse <100) AND [2] still present when not coughing  Answer Assessment - Initial Assessment Questions 1. ONSET: "When did the cough begin?"      Last Tuesday 2. SEVERITY: "How bad is the cough today?"      Coricidin  3. SPUTUM: "Describe the color of your sputum" (none, dry cough; clear, white, yellow, green)     yellow 4. HEMOPTYSIS: "Are you coughing up any blood?" If so ask: "How much?" (flecks, streaks, tablespoons, etc.)     n 5. DIFFICULTY BREATHING: "Are you having difficulty breathing?" If Yes, ask: "How bad is it?" (e.g., mild, moderate, severe)    - MILD: No SOB at rest, mild SOB with walking, speaks normally in sentences, can lie down, no retractions, pulse < 100.    - MODERATE: SOB at rest, SOB with minimal exertion and prefers to sit, cannot lie down flat, speaks in phrases, mild retractions, audible wheezing, pulse 100-120.    - SEVERE: Very SOB at rest, speaks in single words, struggling to breathe, sitting hunched forward, retractions, pulse > 120      SOB with activity 6. FEVER: "Do you have a fever?" If Yes, ask: "What is your temperature, how was it measured, and when did it start?"     no 7. CARDIAC HISTORY: "Do you have any history of heart disease?" (e.g., heart attack, congestive heart failure)      Stent, CHF 8. LUNG HISTORY: "Do you have any history of lung disease?"  (e.g., pulmonary embolus, asthma, emphysema)     no 9. PE RISK FACTORS: "Do you have a history of blood clots?" (or: recent major surgery, recent prolonged travel, bedridden)      no 10. OTHER SYMPTOMS: "Do you have any other symptoms?" (e.g., runny nose, wheezing, chest pain)       Runny nose, cough, diarrhea x2 11. PREGNANCY: "Is there any chance you are pregnant?" "When was your last menstrual period?"       N/a 12. TRAVEL: "Have you traveled out of the country in the last month?" (e.g., travel history, exposures)       np  Protocols used: Cough - Acute Productive-A-AH

## 2021-08-03 ENCOUNTER — Other Ambulatory Visit: Payer: Self-pay | Admitting: Family Medicine

## 2021-08-03 DIAGNOSIS — E039 Hypothyroidism, unspecified: Secondary | ICD-10-CM

## 2021-08-03 NOTE — Telephone Encounter (Signed)
Requested Prescriptions  Pending Prescriptions Disp Refills  . levothyroxine (SYNTHROID) 50 MCG tablet [Pharmacy Med Name: LEVOTHYROXINE 50 MCG TABLET] 90 tablet 2    Sig: TAKE 1 TABLET (50 MCG TOTAL) BY MOUTH DAILY AT 6 (SIX) AM.     Endocrinology:  Hypothyroid Agents Failed - 08/03/2021 10:56 AM      Failed - TSH needs to be rechecked within 3 months after an abnormal result. Refill until TSH is due.      Passed - TSH in normal range and within 360 days    TSH  Date Value Ref Range Status  06/03/2021 2.24 0.40 - 4.50 mIU/L Final         Passed - Valid encounter within last 12 months    Recent Outpatient Visits          5 days ago URI due to influenza   [place], DO   2 months ago Need for influenza vaccination   [facility] Rory Percy M, DO   8 months ago CKD (chronic kidney disease) stage 5, GFR less than 15 ml/min [facility])   [facility] Steele Sizer, MD   1 year ago CKD (chronic kidney disease) stage 5, GFR less than 15 ml/min [facility])   [facility] Steele Sizer, MD   1 year ago Impacted cerumen of right ear   [facility] Delsa Grana, PA-C      Future Appointments            In 4 months Steele Sizer, MD [facility], [facility]

## 2021-08-05 ENCOUNTER — Ambulatory Visit: Payer: Self-pay | Admitting: *Deleted

## 2021-08-05 NOTE — Telephone Encounter (Signed)
Per agent: "The patient's daughter Crystal Faulkner) has called to request the patient be prescribed liquid cough medicine   The patient was previously seen for a concerning cough on 07/29/21   The patient has had a stroke and remains unable to take most medications in pill/tablet form   Please contact the patient's daughter Crystal Faulkner at [phone] to discuss further"      Reason for Disposition  [1] Caller has URGENT medicine question about med that PCP or specialist prescribed AND [2] triager unable to answer question  Answer Assessment - Initial Assessment Questions 1. NAME of MEDICATION: "What medicine are you calling about?"     Needs liquid cough med called in 2. QUESTION: "What is your question?" (e.g., double dose of medicine, side effect)     Can pt have liquid cough med? 3. PRESCRIBING HCP: "Who prescribed it?" Reason: if prescribed by specialist, call should be referred to that group.     Tabs in ED 07/24/21. Cannot swallow 4. SYMPTOMS: "Do you have any symptoms?"     Dry cough 5. SEVERITY: If symptoms are present, ask "Are they mild, moderate or severe?"     No worsening  Protocols used: Medication Question Call-A-AH

## 2021-08-05 NOTE — Telephone Encounter (Signed)
  Chief Complaint: Cough,cannot swallow tablets, capsules of med prescribed in ED. Needs liquid form Symptoms: Dry cough Frequency: Worse at night. No worsening overall, same, "Just no better." Pertinent Negatives: Patient denies SOB, no fever Disposition: [] ED /[] Urgent Care (no appt availability in office) / [] Appointment(In office/virtual)/ []  [facility]/ [x] Home Care/ [] Refused Recommended Disposition  Additional Notes: Pt's daughter calling, pt seen in ED 07/23/21. CAnnot take Benzonatate capsules "Can't swallow and I can't crush the capsules. Requesting liquid form of med be called in. "Anything that helps." Cough is same, no worsening.   CB# [phone]

## 2021-08-06 ENCOUNTER — Telehealth: Payer: Self-pay

## 2021-08-06 NOTE — Telephone Encounter (Signed)
Copied from [facility] [phone]. Topic: Quick Communication - Rx Refill/Question >> Aug 05, 2021  5:52 PM Pawlus, Brayton Layman A wrote: Pts daughter called in to follow up (See NT 08/05/21) wanting to know the status of the liquid cough medicine, please advise.

## 2021-08-06 NOTE — Telephone Encounter (Signed)
Caretaker states that pt has been seen since the ER visit for the ER follow up (with Dr Rosana Berger) and just wanted something for the cough

## 2021-08-06 NOTE — Telephone Encounter (Signed)
Patients daughter came in and sent message yesterday afternoon asking for cough syrup for mother for cough on f/u of appt last week.  I spoke with DR. Rosana Berger and she states due to age she could not give codeine syrup and she would just need to try OTC cough syrup robitussin

## 2021-08-07 ENCOUNTER — Other Ambulatory Visit: Payer: Self-pay

## 2021-08-07 ENCOUNTER — Emergency Department: Payer: Medicare HMO

## 2021-08-07 ENCOUNTER — Inpatient Hospital Stay
Admission: EM | Admit: 2021-08-07 | Discharge: 2021-08-10 | DRG: 871 | Disposition: A | Discharge status: Home / Self Care | Payer: Medicare HMO | Attending: Internal Medicine | Admitting: Internal Medicine

## 2021-08-07 DIAGNOSIS — I25118 Atherosclerotic heart disease of native coronary artery with other forms of angina pectoris: Secondary | ICD-10-CM | POA: Diagnosis present

## 2021-08-07 DIAGNOSIS — I700 Atherosclerosis of aorta: Secondary | ICD-10-CM | POA: Diagnosis present

## 2021-08-07 DIAGNOSIS — I69351 Hemiplegia and hemiparesis following cerebral infarction affecting right dominant side: Secondary | ICD-10-CM

## 2021-08-07 DIAGNOSIS — M810 Age-related osteoporosis without current pathological fracture: Secondary | ICD-10-CM | POA: Diagnosis present

## 2021-08-07 DIAGNOSIS — G319 Degenerative disease of nervous system, unspecified: Secondary | ICD-10-CM | POA: Diagnosis not present

## 2021-08-07 DIAGNOSIS — Z955 Presence of coronary angioplasty implant and graft: Secondary | ICD-10-CM

## 2021-08-07 DIAGNOSIS — G9389 Other specified disorders of brain: Secondary | ICD-10-CM | POA: Diagnosis not present

## 2021-08-07 DIAGNOSIS — E785 Hyperlipidemia, unspecified: Secondary | ICD-10-CM | POA: Diagnosis present

## 2021-08-07 DIAGNOSIS — F09 Unspecified mental disorder due to known physiological condition: Secondary | ICD-10-CM

## 2021-08-07 DIAGNOSIS — N2581 Secondary hyperparathyroidism of renal origin: Secondary | ICD-10-CM | POA: Diagnosis present

## 2021-08-07 DIAGNOSIS — D638 Anemia in other chronic diseases classified elsewhere: Secondary | ICD-10-CM | POA: Diagnosis present

## 2021-08-07 DIAGNOSIS — Z8719 Personal history of other diseases of the digestive system: Secondary | ICD-10-CM

## 2021-08-07 DIAGNOSIS — I5032 Chronic diastolic (congestive) heart failure: Secondary | ICD-10-CM | POA: Diagnosis present

## 2021-08-07 DIAGNOSIS — J0130 Acute sphenoidal sinusitis, unspecified: Secondary | ICD-10-CM | POA: Diagnosis not present

## 2021-08-07 DIAGNOSIS — I2089 Other forms of angina pectoris: Secondary | ICD-10-CM | POA: Diagnosis present

## 2021-08-07 DIAGNOSIS — N8184 Pelvic muscle wasting: Secondary | ICD-10-CM | POA: Diagnosis present

## 2021-08-07 DIAGNOSIS — E039 Hypothyroidism, unspecified: Secondary | ICD-10-CM | POA: Diagnosis present

## 2021-08-07 DIAGNOSIS — Z79899 Other long term (current) drug therapy: Secondary | ICD-10-CM | POA: Diagnosis not present

## 2021-08-07 DIAGNOSIS — D631 Anemia in chronic kidney disease: Secondary | ICD-10-CM | POA: Diagnosis present

## 2021-08-07 DIAGNOSIS — Z7902 Long term (current) use of antithrombotics/antiplatelets: Secondary | ICD-10-CM

## 2021-08-07 DIAGNOSIS — Z20822 Contact with and (suspected) exposure to covid-19: Secondary | ICD-10-CM | POA: Diagnosis present

## 2021-08-07 DIAGNOSIS — J449 Chronic obstructive pulmonary disease, unspecified: Secondary | ICD-10-CM | POA: Diagnosis not present

## 2021-08-07 DIAGNOSIS — D649 Anemia, unspecified: Secondary | ICD-10-CM | POA: Diagnosis present

## 2021-08-07 DIAGNOSIS — Z8249 Family history of ischemic heart disease and other diseases of the circulatory system: Secondary | ICD-10-CM

## 2021-08-07 DIAGNOSIS — I132 Hypertensive heart and chronic kidney disease with heart failure and with stage 5 chronic kidney disease, or end stage renal disease: Secondary | ICD-10-CM | POA: Diagnosis not present

## 2021-08-07 DIAGNOSIS — I10 Essential (primary) hypertension: Secondary | ICD-10-CM | POA: Diagnosis present

## 2021-08-07 DIAGNOSIS — Z66 Do not resuscitate: Secondary | ICD-10-CM | POA: Diagnosis not present

## 2021-08-07 DIAGNOSIS — N185 Chronic kidney disease, stage 5: Secondary | ICD-10-CM | POA: Diagnosis present

## 2021-08-07 DIAGNOSIS — I208 Other forms of angina pectoris: Secondary | ICD-10-CM | POA: Diagnosis present

## 2021-08-07 DIAGNOSIS — A419 Sepsis, unspecified organism: Principal | ICD-10-CM | POA: Diagnosis present

## 2021-08-07 DIAGNOSIS — K219 Gastro-esophageal reflux disease without esophagitis: Secondary | ICD-10-CM | POA: Diagnosis present

## 2021-08-07 DIAGNOSIS — R531 Weakness: Secondary | ICD-10-CM | POA: Diagnosis not present

## 2021-08-07 DIAGNOSIS — M109 Gout, unspecified: Secondary | ICD-10-CM | POA: Diagnosis not present

## 2021-08-07 DIAGNOSIS — Z87891 Personal history of nicotine dependence: Secondary | ICD-10-CM

## 2021-08-07 DIAGNOSIS — Z79890 Hormone replacement therapy (postmenopausal): Secondary | ICD-10-CM

## 2021-08-07 DIAGNOSIS — J189 Pneumonia, unspecified organism: Secondary | ICD-10-CM | POA: Diagnosis not present

## 2021-08-07 DIAGNOSIS — Z8711 Personal history of peptic ulcer disease: Secondary | ICD-10-CM | POA: Diagnosis not present

## 2021-08-07 DIAGNOSIS — J90 Pleural effusion, not elsewhere classified: Secondary | ICD-10-CM | POA: Diagnosis not present

## 2021-08-07 DIAGNOSIS — F0390 Unspecified dementia without behavioral disturbance: Secondary | ICD-10-CM | POA: Diagnosis present

## 2021-08-07 DIAGNOSIS — K449 Diaphragmatic hernia without obstruction or gangrene: Secondary | ICD-10-CM | POA: Diagnosis not present

## 2021-08-07 DIAGNOSIS — R059 Cough, unspecified: Secondary | ICD-10-CM | POA: Diagnosis not present

## 2021-08-07 LAB — RESP PANEL BY RT-PCR (FLU A&B, COVID) ARPGX2
Influenza A by PCR: NEGATIVE
Influenza B by PCR: NEGATIVE
SARS Coronavirus 2 by RT PCR: NEGATIVE

## 2021-08-07 LAB — HEPATIC FUNCTION PANEL
ALT: 35 U/L (ref 0–44)
AST: 32 U/L (ref 15–41)
Albumin: 2.2 g/dL — ABNORMAL LOW (ref 3.5–5.0)
Alkaline Phosphatase: 55 U/L (ref 38–126)
Bilirubin, Direct: <0.1 mg/dL (ref 0.0–0.2)
Total Bilirubin: 0.7 mg/dL (ref 0.3–1.2)
Total Protein: 5.9 g/dL — ABNORMAL LOW (ref 6.5–8.1)

## 2021-08-07 LAB — BASIC METABOLIC PANEL
Anion gap: 7 (ref 5–15)
BUN: 71 mg/dL — ABNORMAL HIGH (ref 8–23)
CO2: 25 mmol/L (ref 22–32)
Calcium: 8.6 mg/dL — ABNORMAL LOW (ref 8.9–10.3)
Chloride: 105 mmol/L (ref 98–111)
Creatinine, Ser: 3.85 mg/dL — ABNORMAL HIGH (ref 0.44–1.00)
GFR, Estimated: 10 mL/min — ABNORMAL LOW (ref >60)
Glucose, Bld: 123 mg/dL — ABNORMAL HIGH (ref 70–99)
Potassium: 4.1 mmol/L (ref 3.5–5.1)
Sodium: 137 mmol/L (ref 135–145)

## 2021-08-07 LAB — LACTIC ACID, PLASMA
Lactic Acid, Venous: 1.2 mmol/L (ref 0.5–1.9)
Lactic Acid, Venous: 0.8 mmol/L (ref 0.5–1.9)

## 2021-08-07 LAB — CBC
HCT: 28.5 % — ABNORMAL LOW (ref 36.0–46.0)
Hemoglobin: 8.9 g/dL — ABNORMAL LOW (ref 12.0–15.0)
MCH: 30.9 pg (ref 26.0–34.0)
MCHC: 31.2 g/dL (ref 30.0–36.0)
MCV: 99 fL (ref 80.0–100.0)
Platelets: 316 10*3/uL (ref 150–400)
RBC: 2.88 MIL/uL — ABNORMAL LOW (ref 3.87–5.11)
RDW: 13.3 % (ref 11.5–15.5)
WBC: 19.6 10*3/uL — ABNORMAL HIGH (ref 4.0–10.5)
nRBC: 0.1 % (ref 0.0–0.2)

## 2021-08-07 LAB — PROTIME-INR
INR: 1.2 (ref 0.8–1.2)
Prothrombin Time: 15.6 seconds — ABNORMAL HIGH (ref 11.4–15.2)

## 2021-08-07 LAB — PROCALCITONIN: Procalcitonin: 0.6 ng/mL

## 2021-08-07 LAB — APTT: aPTT: 28 seconds (ref 24–36)

## 2021-08-07 MED ORDER — ACETAMINOPHEN 500 MG PO TABS
1000.0000 mg | ORAL_TABLET | Freq: Four times a day (QID) | ORAL | Status: DC | PRN
  Filled 2021-08-07: qty 2

## 2021-08-07 MED ORDER — LEVOTHYROXINE SODIUM 50 MCG PO TABS
50.0000 ug | ORAL_TABLET | Freq: Every day | ORAL | Status: DC
  Administered 2021-08-08 – 2021-08-10 (×3): 50 ug via ORAL
  Filled 2021-08-07 (×3): qty 1

## 2021-08-07 MED ORDER — SODIUM CHLORIDE 0.9 % IV SOLN
500.0000 mg | INTRAVENOUS | Status: DC
  Administered 2021-08-07 – 2021-08-09 (×3): 500 mg via INTRAVENOUS
  Filled 2021-08-07 (×4): qty 5

## 2021-08-07 MED ORDER — ONDANSETRON HCL 4 MG PO TABS: 4.0000 mg | ORAL_TABLET | Freq: Four times a day (QID) | ORAL | Status: DC | PRN

## 2021-08-07 MED ORDER — HEPARIN SODIUM (PORCINE) 5000 UNIT/ML IJ SOLN
5000.0000 [IU] | Freq: Three times a day (TID) | INTRAMUSCULAR | Status: DC
  Administered 2021-08-07 – 2021-08-09 (×5): 5000 [IU] via SUBCUTANEOUS
  Filled 2021-08-07 (×4): qty 1

## 2021-08-07 MED ORDER — PANTOPRAZOLE SODIUM 40 MG PO TBEC
40.0000 mg | DELAYED_RELEASE_TABLET | Freq: Every day | ORAL | Status: DC
  Administered 2021-08-08 – 2021-08-10 (×3): 40 mg via ORAL
  Filled 2021-08-07 (×3): qty 1

## 2021-08-07 MED ORDER — ONDANSETRON HCL 4 MG/2ML IJ SOLN: 4.0000 mg | Freq: Four times a day (QID) | INTRAMUSCULAR | Status: DC | PRN

## 2021-08-07 MED ORDER — SODIUM CHLORIDE 0.9 % IV SOLN
2.0000 g | INTRAVENOUS | Status: DC
  Administered 2021-08-07 – 2021-08-09 (×3): 2 g via INTRAVENOUS
  Filled 2021-08-07 (×4): qty 20

## 2021-08-07 MED ORDER — SODIUM CHLORIDE 0.9 % IV BOLUS
500.0000 mL | Freq: Once | INTRAVENOUS | Status: AC
  Administered 2021-08-07: 21:00:00 500 mL via INTRAVENOUS

## 2021-08-07 MED ORDER — ACETAMINOPHEN 650 MG RE SUPP: 650.0000 mg | Freq: Four times a day (QID) | RECTAL | Status: DC | PRN

## 2021-08-07 NOTE — ED Provider Notes (Signed)
[facility] Emergency Department Provider Note  ____________________________________________  Time seen: Approximately 8:56 PM  I have reviewed the triage vital signs and the nursing notes.   HISTORY  Chief Complaint Weakness    Level 5 Caveat: Portions of the History and Physical including HPI and review of systems are unable to be completely obtained due to patient being nonverbal at baseline  HPI Crystal Faulkner is a 87 y.o. female with a history of CKD, hypertension, dementia who was brought to the ED due to worsening generalized weakness over the past few days, patient currently unable to move herself or support her own weight at all.  Normally family is able to manage moving her from bed to wheelchair to other seating in the house and bathroom throughout the day, but currently the patient is so weak that they are not able to get her out of the bed.  2 family members had to carry her down the stairs to get out of the house.  Patient has congested cough.  She was diagnosed with flu 9 days ago.  No vomiting or diarrhea.    Past Medical History:  Diagnosis Date   Allergy    Alzheimer's disease ([place])    Anemia    CCF (congestive cardiac failure) (HCC)    Chronic kidney disease    Chronic stable angina (HCC)    Frequent falls    Gout    H/O: GI bleed    Hypercholesteremia    Hyperglycemia    Hyperlipidemia    Hypertension    Incontinence    Mixed dementia (HCC)    Myocardial infarction (HCC)    Numbness of right hand    Osteoporosis    Right hand weakness    Stroke [facility])    Thyroid disease    Vitamin D deficiency      Patient Active Problem List   Diagnosis Date Noted   Osteoarthritis 11/19/2017   Dysarthria due to recent cerebrovascular accident (CVA) 06/23/2016   Hemiparesis affecting right side as late effect of cerebrovascular accident (CVA) ([place]) 06/23/2016   CKD (chronic kidney disease) stage 5, GFR less than 15 ml/min ([place])  06/23/2016   Senile purpura ([place]) 05/13/2016   Hiatal hernia 05/04/2016   Thoracic aorta atherosclerosis ([place]) 05/04/2016   Chronic stable angina ([place]) 10/16/2015   Secondary hyperparathyroidism ([place]) 04/13/2015   Arteriosclerosis of coronary artery 04/08/2015   Benign hypertension 04/08/2015   Controlled gout 04/08/2015   History of peptic ulcer disease 04/08/2015   Adult hypothyroidism 04/08/2015   Mild cognitive disorder 04/08/2015   Anemia of chronic disease 04/08/2015   Osteoarthrosis involving more than one site but not generalized 04/08/2015   Osteoporosis 04/08/2015   Pelvic muscle wasting 04/08/2015   Allergic rhinitis 04/08/2015   Chronic diastolic CHF (congestive heart failure) ([place]) 03/29/2014   HLD (hyperlipidemia) 03/29/2014   H/O gastrointestinal hemorrhage 04/05/2007     Past Surgical History:  Procedure Laterality Date   EXPLORATORY LAPAROTOMY     EYE SURGERY Bilateral    cataract     Prior to Admission medications   Medication Sig Start Date End Date Taking? Authorizing Provider  allopurinol (ZYLOPRIM) 100 MG tablet TAKE 1 TABLET BY MOUTH EVERY DAY 07/10/21   Ancil Boozer, Drue Stager, MD  amLODipine (NORVASC) 5 MG tablet amlodipine 5 mg tablet    [provider]  benzonatate (TESSALON PERLES) 100 MG capsule Take 1 capsule (100 mg total) by mouth every 6 (six) hours as needed for cough. 07/23/21 07/23/22  Vladimir Crofts, MD  calcitRIOL (ROCALTROL) 0.25 MCG capsule Take 0.25 mcg by mouth daily. 09/05/19   [provider]  clopidogrel (PLAVIX) 75 MG tablet TAKE 1 TABLET BY MOUTH EVERY DAY 08/28/20   Steele Sizer, MD  ferrous sulfate 325 (65 FE) MG tablet Take 325 mg by mouth 2 (two) times daily with a meal.    [provider]  furosemide (LASIX) 40 MG tablet Take 1 tablet (40 mg total) by mouth every other day. 10/17/18 10/17/19  Gladstone Lighter, MD  isosorbide mononitrate (IMDUR) 60 MG 24 hr tablet TAKE 1 TABLET BY MOUTH EVERY DAY 10/08/20    Ancil Boozer, Drue Stager, MD  levothyroxine (SYNTHROID) 50 MCG tablet TAKE 1 TABLET (50 MCG TOTAL) BY MOUTH DAILY AT 6 (SIX) AM. 08/03/21   Ancil Boozer, Drue Stager, MD  methylPREDNISolone (MEDROL DOSEPAK) 4 MG TBPK tablet Day 1: Take 8 mg (2 tablets) before breakfast, 4 mg (1 tablet) after lunch, 4 mg (1 tablet) after supper, and 8 mg (2 tablets) at bedtime. Day 2:Take 4 mg (1 tablet) before breakfast, 4 mg (1 tablet) after lunch, 4 mg (1 tablet) after supper, and 8 mg (2 tablets) at bedtime. Day 3: Take 4 mg (1 tablet) before breakfast, 4 mg (1 tablet) after lunch, 4 mg (1 tablet) after supper, and 4 mg (1 tablet) at bedtime. Day 4: Take 4 mg (1 tablet) before breakfast, 4 mg (1 tablet) after lunch, and 4 mg (1 tablet) at bedtime. Day 5: Take 4 mg (1 tablet) before breakfast and 4 mg (1 tablet) at bedtime. Day 6: Take 4 mg (1 tablet) before breakfast. 07/29/21   Teodora Medici, DO  pantoprazole (PROTONIX) 40 MG tablet TAKE 1 TABLET BY MOUTH EVERY DAY 07/02/21   Steele Sizer, MD  raloxifene (EVISTA) 60 MG tablet TAKE 1 TABLET BY MOUTH EVERY DAY 05/08/21   Ancil Boozer, Drue Stager, MD  rosuvastatin (CRESTOR) 10 MG tablet TAKE 1 TABLET BY MOUTH EVERY DAY 08/28/20   Steele Sizer, MD  sodium bicarbonate 650 MG tablet Take 650 mg by mouth daily.  03/19/15   [provider]     Allergies Patient has no known allergies.   Family History  Problem Relation Age of Onset   Diabetes Sister    Hypertension Sister    Hypertension Brother    Heart disease Brother     Social History Social History   Tobacco Use   Smoking status: Never   Smokeless tobacco: Former    Types: Snuff   Tobacco comments:    smoking cessation materials not requuired  Scientific laboratory technician Use: Never used  Substance Use Topics   Alcohol use: No   Drug use: No    Review of Systems Level 5 Caveat: Portions of the History and Physical including HPI and review of systems are unable to be completely obtained due to patient being a poor  historian   Constitutional:   No known fever.  Positive generalized weakness ENT:   No rhinorrhea. Cardiovascular:   No chest pain or syncope. Respiratory:   Positive shortness of breath and cough. Gastrointestinal:   Negative for abdominal pain, vomiting and diarrhea.  Musculoskeletal:   Negative for focal pain or swelling ____________________________________________   PHYSICAL EXAM:  VITAL SIGNS: ED Triage Vitals [08/07/21 1718]  Enc Vitals Group     BP 120/70     Pulse Rate 95     Resp (!) 22     Temp 98.2 F (36.8 C)     Temp src  SpO2 96 %     Weight      Height      Head Circumference      Peak Flow      Pain Score      Pain Loc      Pain Edu?      Excl. in [place]?     Vital signs reviewed, nursing assessments reviewed.   Constitutional:   Awake and alert.  Not oriented.. Non-toxic appearance. Eyes:   Conjunctivae are normal. EOMI. PERRL. ENT      Head:   Normocephalic and atraumatic.      Nose:   No congestion/rhinnorhea.       Mouth/Throat:   MMM, no pharyngeal erythema. No peritonsillar mass.       Neck:   No meningismus. Full ROM. Hematological/Lymphatic/Immunilogical:   No cervical lymphadenopathy. Cardiovascular:   RRR. Symmetric bilateral radial and DP pulses.  No murmurs. Cap refill less than 2 seconds. Respiratory:   Tachypnea with mildly increased work of breathing.  Crackles in the right base. Gastrointestinal:   Soft and nontender. Non distended. There is no CVA tenderness.  No rebound, rigidity, or guarding. Genitourinary:   deferred Musculoskeletal:   Normal range of motion in all extremities. No joint effusions.  No lower extremity tenderness.  No edema. Neurologic:   Nonverbal Motor grossly intact. No acute focal neurologic deficits are appreciated.  Skin:    Skin is warm, dry and intact. No rash noted.  No petechiae, purpura, or bullae.  ____________________________________________    LABS (pertinent positives/negatives) (all labs  ordered are listed, but only abnormal results are displayed) Labs Reviewed  BASIC METABOLIC PANEL - Abnormal; Notable for the following components:      Result Value   Glucose, Bld 123 (*)    BUN 71 (*)    Creatinine, Ser 3.85 (*)    Calcium 8.6 (*)    GFR, Estimated 10 (*)    All other components within normal limits  CBC - Abnormal; Notable for the following components:   WBC 19.6 (*)    RBC 2.88 (*)    Hemoglobin 8.9 (*)    HCT 28.5 (*)    All other components within normal limits  CULTURE, BLOOD (ROUTINE X 2)  CULTURE, BLOOD (ROUTINE X 2)  RESP PANEL BY RT-PCR (FLU A&B, COVID) ARPGX2  URINALYSIS, ROUTINE W REFLEX MICROSCOPIC  LACTIC ACID, PLASMA  LACTIC ACID, PLASMA  PROTIME-INR  APTT  HEPATIC FUNCTION PANEL  PROCALCITONIN  CBG MONITORING, ED   ____________________________________________   EKG Interpreted by me Sinus tachycardia rate of 100.  Normal axis, normal intervals.  Normal QRS and ST segments.  Unremarkable T waves, no acute ischemic changes.  No significant change compared to previous EKG on June 19, 2021.   ____________________________________________    RADIOLOGY  DG Chest 2 View  Result Date: 08/07/2021 CLINICAL DATA:  Cough EXAM: CHEST - 2 VIEW COMPARISON:  07/23/2021 FINDINGS: Large hiatal hernia. There is hyperinflation of the lungs compatible with COPD. Heart is normal size. Small left pleural effusion. Airspace opacity in the right lower lobe concerning for pneumonia. No confluent opacity on the left. Aortic atherosclerosis. IMPRESSION: Right lower lobe airspace opacity concerning for pneumonia. Small left pleural effusion. Large hiatal hernia. COPD.  Aortic atherosclerosis. Electronically Signed   By: Rolm Baptise M.D.   On: 08/07/2021 18:04   CT HEAD WO CONTRAST (5MM)  Result Date: 08/07/2021 CLINICAL DATA:  Weakness EXAM: CT HEAD WITHOUT CONTRAST TECHNIQUE: Contiguous axial images were  obtained from the base of the skull through the vertex  without intravenous contrast. COMPARISON:  06/18/2021 FINDINGS: Brain: Old left MCA infarct with encephalomalacia in the left parietal and frontal lobes, unchanged. There is atrophy and chronic small vessel disease changes. No acute intracranial abnormality. Specifically, no hemorrhage, hydrocephalus, mass lesion, acute infarction, or significant intracranial injury. Vascular: No hyperdense vessel or unexpected calcification. Skull: No acute calvarial abnormality. Sinuses/Orbits: Air-fluid level and mucosal thickening in the left sphenoid sinus. Remainder the paranasal sinuses clear. Other: None IMPRESSION: Old left MCA infarct. Atrophy, chronic microvascular disease. No acute intracranial abnormality. Acute on chronic left sphenoid sinusitis. Electronically Signed   By: Rolm Baptise M.D.   On: 08/07/2021 18:29    ____________________________________________   PROCEDURES Procedures  ____________________________________________  DIFFERENTIAL DIAGNOSIS   Pneumonia, pleural effusion, pulmonary edema, dehydration, electrolyte abnormality, UTI, intracranial hemorrhage, acute anemia  CLINICAL IMPRESSION / ASSESSMENT AND PLAN / ED COURSE  Medications ordered in the ED: Medications  cefTRIAXone (ROCEPHIN) 2 g in sodium chloride 0.9 % 100 mL IVPB (has no administration in time range)  azithromycin (ZITHROMAX) 500 mg in sodium chloride 0.9 % 250 mL IVPB (has no administration in time range)    Pertinent labs & imaging results that were available during my care of the patient were reviewed by me and considered in my medical decision making (see chart for details).   Crystal L Hinde was evaluated in Emergency Department on 08/07/2021 for the symptoms described in the history of present illness. She was evaluated in the context of the global COVID-19 pandemic, which necessitated consideration that the patient might be at risk for infection with the SARS-CoV-2 virus that causes COVID-19. Institutional  protocols and algorithms that pertain to the evaluation of patients at risk for COVID-19 are in a state of rapid change based on information released by regulatory bodies including the CDC and federal and state organizations. These policies and algorithms were followed during the patient's care in the ED.   Patient presents with generalized weakness, increased shortness of breath and cough.  Presentation most concerning for pneumonia.  She does have some mild tachypnea and tachycardia concerning for sepsis.  Will check blood cultures and lactate.  White blood cell count is 19,000.  IV Rocephin and azithromycin ordered.  Chest x-ray does show a right basilar infiltrate consistent with pneumonia.  Serum labs consistent with dehydration on top of baseline CKD.  No signs of shock.  Will give IV fluid bolus and plan to admit.      ____________________________________________   FINAL CLINICAL IMPRESSION(S) / ED DIAGNOSES    Final diagnoses:  Community acquired pneumonia of right lower lobe of lung  CKD (chronic kidney disease) stage 5, GFR less than 15 ml/min (HCC)  Generalized weakness     ED Discharge Orders     None       Portions of this note were generated with dragon dictation software. Dictation errors may occur despite best attempts at proofreading.   Carrie Mew, MD 08/07/21 2101

## 2021-08-07 NOTE — ED Notes (Signed)
Full rainbow sent to lab.  

## 2021-08-07 NOTE — ED Notes (Signed)
Unable to collect second set of blood cultures due to difficult stick.

## 2021-08-07 NOTE — Telephone Encounter (Signed)
Pt daughter notified yesterday, documented in another note Dr. Rosana Berger could not give her syrup with codeine due to health and age,  She was informed to do OTC meds such as robitussin.

## 2021-08-07 NOTE — ED Triage Notes (Addendum)
Pt comes with c/o increased weakness. Pt was dx with Flu on Dec 5. Pt has had increased weakness and unable to walk. Family states they had to carry her out of house into car.  Pt has junkie cough present. Family reports pt is short winded. Pt has some labored breathing noted.

## 2021-08-07 NOTE — ED Provider Notes (Signed)
Emergency Medicine Provider Triage Evaluation Note  Crystal Faulkner , a 87 y.o. female  was evaluated in triage.  Pt here for evaluation of cough and weakness. Diagnosed with influenza a week ago and has progressively worsened. No fever. Eating and drinking normally.  Review of Systems  Positive: Cough, weakness Negative: Vomiting diarrhea  Physical Exam  BP 120/70    Pulse 95    Temp 98.2 F (36.8 C)    Resp (!) 22    SpO2 96%  Gen:   Awake, no distress   Resp:  Normal effort; congested cough noted. MSK:   Moves extremities without difficulty  Other:    Medical Decision Making  Medically screening exam initiated at 5:19 PM.  Appropriate orders placed.  Crystal Faulkner was informed that the remainder of the evaluation will be completed by another provider, this initial triage assessment does not replace that evaluation, and the importance of remaining in the ED until their evaluation is complete.   Victorino Dike, FNP 08/07/21 1724    Nena Polio, MD 08/07/21 [id number]

## 2021-08-07 NOTE — H&P (Addendum)
History and Physical   Crystal Faulkner ZOX:[id number] DOB: 07-31-1925 DOA: 08/07/2021  PCP: Steele Sizer, MD  Outpatient Specialists: Dr. Saralyn Pilar Patient coming from: Home via EMS  I have personally briefly reviewed patient's old medical records in [place].  Chief Concern: Generalized weakness  HPI: Crystal Faulkner is a 87 y.o. female with medical history significant for Hypertension, history of gout, CAD status post PCI via DES in 2004, hypothyroid, GERD, hyperlipidemia, dementia, who presents to the emergency department for chief concerns of generalized weakness.  At bedside, patient tracts me and smiles when I talk with her. She spontaneously opens her eyes, does not follow commands, and does not respond to my questions. She does not appear to be in acute distress.   When I ask her questions, she smiled at me.  She does have a wet cough.   No family was at bedside  Social history: Unknown  Vaccination history: Unknown  ROS: Not able to complete as patient is not verbal  ED Course: Discussed with emergency medicine provider, patient requiring hospitalization for chief concerns of pneumonia.  Vitals in the emergency department was remarkable for temperature of 98.2, respiration rate of 22, heart rate of 95, blood pressure 120/70, SPO2 of 96% on room air.  Labs in the emergency department was remarkable for serum sodium 137, potassium 4.1, chloride 105, bicarb 25, BUN of 71, serum creatinine of 3.85, nonfasting blood glucose 123, GFR 10.  Leukocytosis was elevated at 19.6, hemoglobin 8.9, platelets 316.  In the emergency department patient was given azithromycin, ceftriaxone 2 g IV.  Assessment/Plan  Principal Problem:   Community acquired pneumonia Active Problems:   Benign hypertension   Controlled gout   H/O gastrointestinal hemorrhage   History of peptic ulcer disease   Adult hypothyroidism   Mild cognitive disorder   Anemia of chronic disease    Osteoporosis   Pelvic muscle wasting   Chronic diastolic CHF (congestive heart failure) (HCC)   HLD (hyperlipidemia)   Secondary hyperparathyroidism (HCC)   Chronic stable angina (HCC)   Thoracic aorta atherosclerosis (HCC)   Hemiparesis affecting right side as late effect of cerebrovascular accident (CVA) (HCC)   CKD (chronic kidney disease) stage 5, GFR less than 15 ml/min (HCC)   # Community-acquired pneumonia - Met sepsis criteria with increased respiration rate, leukocytosis, source of right lower lobe pneumonia - Blood culturesX2 ordered, UA ordered, procalcitonin - Given that patient was recently diagnosed with influenza type a, on 07/23/2021, I am checking for MRSA PCR as patient has increased risk of developing MRSA pneumonia in setting of recent influenza infection - MRSA PCR, if positive would recommend a.m. team to add vancomycin and possible consideration of a CT chest without contrast for further radiologic evaluation for some pneumonia - Incentive spirometry, flutter valve every 1- 2 hours while awake - Continue azithromycin and ceftriaxone - Tessalon Perles as needed for cough - Aspiration precautions - CBC in the a.m.  # CAD-resumed Imdur 60 mg daily, rosuvastatin 10 mg daily  # Memory decline/dementia-baseline unknown  # Hypothyroid-levothyroxine 50 mcg daily resumed  # Gout-in remission-resumed allopurinol 50 mg daily  # Chronic anemia-resumed home ferrous sulfate 325 mg p.o. twice daily with meals  # CKD 4/5-resumed bicarbonate sodium 650 mg p.o. daily  # GERD-PPI resumed  Fall precautions  Chart reviewed.   DVT prophylaxis: Heparin 5000 units 3 times daily Code Status: DNR/DNI Diet: Heart healthy, nectar thick Family Communication:  no family at bedside Disposition Plan: Pending clinical  course Consults called: None at this time Admission status: MedSurg, observation, no telemetry  Past Medical History:  Diagnosis Date   Allergy    Alzheimer's  disease ([place])    Anemia    CCF (congestive cardiac failure) (HCC)    Chronic kidney disease    Chronic stable angina (HCC)    Frequent falls    Gout    H/O: GI bleed    Hypercholesteremia    Hyperglycemia    Hyperlipidemia    Hypertension    Incontinence    Mixed dementia ([place])    Myocardial infarction (HCC)    Numbness of right hand    Osteoporosis    Right hand weakness    Stroke ([place])    Thyroid disease    Vitamin D deficiency    Past Surgical History:  Procedure Laterality Date   EXPLORATORY LAPAROTOMY     EYE SURGERY Bilateral    cataract   Social History:  reports that she has never smoked. She has quit using smokeless tobacco.  Her smokeless tobacco use included snuff. She reports that she does not drink alcohol and does not use drugs.  No Known Allergies Family History  Problem Relation Age of Onset   Diabetes Sister    Hypertension Sister    Hypertension Brother    Heart disease Brother    Family history: Family history reviewed and not pertinent  Prior to Admission medications   Medication Sig Start Date End Date Taking? Authorizing Provider  allopurinol (ZYLOPRIM) 100 MG tablet TAKE 1 TABLET BY MOUTH EVERY DAY 07/10/21   Ancil Boozer, Drue Stager, MD  amLODipine (NORVASC) 5 MG tablet amlodipine 5 mg tablet    [provider]  benzonatate (TESSALON PERLES) 100 MG capsule Take 1 capsule (100 mg total) by mouth every 6 (six) hours as needed for cough. 07/23/21 07/23/22  Vladimir Crofts, MD  calcitRIOL (ROCALTROL) 0.25 MCG capsule Take 0.25 mcg by mouth daily. 09/05/19   [provider]  clopidogrel (PLAVIX) 75 MG tablet TAKE 1 TABLET BY MOUTH EVERY DAY 08/28/20   Steele Sizer, MD  ferrous sulfate 325 (65 FE) MG tablet Take 325 mg by mouth 2 (two) times daily with a meal.    [provider]  furosemide (LASIX) 40 MG tablet Take 1 tablet (40 mg total) by mouth every other day. 10/17/18 10/17/19  Gladstone Lighter, MD  isosorbide mononitrate (IMDUR)  60 MG 24 hr tablet TAKE 1 TABLET BY MOUTH EVERY DAY 10/08/20   Ancil Boozer, Drue Stager, MD  levothyroxine (SYNTHROID) 50 MCG tablet TAKE 1 TABLET (50 MCG TOTAL) BY MOUTH DAILY AT 6 (SIX) AM. 08/03/21   Ancil Boozer, Drue Stager, MD  methylPREDNISolone (MEDROL DOSEPAK) 4 MG TBPK tablet Day 1: Take 8 mg (2 tablets) before breakfast, 4 mg (1 tablet) after lunch, 4 mg (1 tablet) after supper, and 8 mg (2 tablets) at bedtime. Day 2:Take 4 mg (1 tablet) before breakfast, 4 mg (1 tablet) after lunch, 4 mg (1 tablet) after supper, and 8 mg (2 tablets) at bedtime. Day 3: Take 4 mg (1 tablet) before breakfast, 4 mg (1 tablet) after lunch, 4 mg (1 tablet) after supper, and 4 mg (1 tablet) at bedtime. Day 4: Take 4 mg (1 tablet) before breakfast, 4 mg (1 tablet) after lunch, and 4 mg (1 tablet) at bedtime. Day 5: Take 4 mg (1 tablet) before breakfast and 4 mg (1 tablet) at bedtime. Day 6: Take 4 mg (1 tablet) before breakfast. 07/29/21   Teodora Medici, DO  pantoprazole (  PROTONIX) 40 MG tablet TAKE 1 TABLET BY MOUTH EVERY DAY 07/02/21   Steele Sizer, MD  raloxifene (EVISTA) 60 MG tablet TAKE 1 TABLET BY MOUTH EVERY DAY 05/08/21   Ancil Boozer, Drue Stager, MD  rosuvastatin (CRESTOR) 10 MG tablet TAKE 1 TABLET BY MOUTH EVERY DAY 08/28/20   Steele Sizer, MD  sodium bicarbonate 650 MG tablet Take 650 mg by mouth daily.  03/19/15   [provider]   Physical Exam: Vitals:   08/07/21 1718  BP: 120/70  Pulse: 95  Resp: (!) 22  Temp: 98.2 F (36.8 C)  SpO2: 96%   Constitutional: appears frail, NAD, calm, comfortable Eyes: PERRL, lids and conjunctivae normal ENMT: Mucous membranes are moist. Posterior pharynx clear of any exudate or lesions. Age-appropriate dentition. Unable to assess hearing Neck: normal, supple, no masses, no thyromegaly Respiratory: Generalized decreased lung sounds, no wheezing, no crackles. Normal respiratory effort. No accessory muscle use.  Cardiovascular: Regular rate and rhythm, no murmurs / rubs /  gallops. No extremity edema. 2+ pedal pulses. No carotid bruits.  Abdomen: no tenderness, no masses palpated, no hepatosplenomegaly. Bowel sounds positive.  Musculoskeletal: no clubbing / cyanosis. No joint deformity upper and lower extremities. Weak ROM. She had contractures/deformities of the right hand. She moves all extremities on her own and does follow commands. She has deformities of her right ankle/food. no contractures, no atrophy. Normal muscle tone.  Skin: no rashes, lesions, ulcers. No induration Neurologic: Sensation intact. Strength 5/5 in all 4.  Psychiatric: Normal judgment and insight. Alert and oriented x 3. Normal mood.   EKG: independently reviewed, showing sinus rhythm with rate of 100, QTc 446  Chest x-ray on Admission: I personally reviewed and I agree with radiologist reading as below.  DG Chest 2 View  Result Date: 08/07/2021 CLINICAL DATA:  Cough EXAM: CHEST - 2 VIEW COMPARISON:  07/23/2021 FINDINGS: Large hiatal hernia. There is hyperinflation of the lungs compatible with COPD. Heart is normal size. Small left pleural effusion. Airspace opacity in the right lower lobe concerning for pneumonia. No confluent opacity on the left. Aortic atherosclerosis. IMPRESSION: Right lower lobe airspace opacity concerning for pneumonia. Small left pleural effusion. Large hiatal hernia. COPD.  Aortic atherosclerosis. Electronically Signed   By: Rolm Baptise M.D.   On: 08/07/2021 18:04   CT HEAD WO CONTRAST (5MM)  Result Date: 08/07/2021 CLINICAL DATA:  Weakness EXAM: CT HEAD WITHOUT CONTRAST TECHNIQUE: Contiguous axial images were obtained from the base of the skull through the vertex without intravenous contrast. COMPARISON:  06/18/2021 FINDINGS: Brain: Old left MCA infarct with encephalomalacia in the left parietal and frontal lobes, unchanged. There is atrophy and chronic small vessel disease changes. No acute intracranial abnormality. Specifically, no hemorrhage, hydrocephalus, mass  lesion, acute infarction, or significant intracranial injury. Vascular: No hyperdense vessel or unexpected calcification. Skull: No acute calvarial abnormality. Sinuses/Orbits: Air-fluid level and mucosal thickening in the left sphenoid sinus. Remainder the paranasal sinuses clear. Other: None IMPRESSION: Old left MCA infarct. Atrophy, chronic microvascular disease. No acute intracranial abnormality. Acute on chronic left sphenoid sinusitis. Electronically Signed   By: Rolm Baptise M.D.   On: 08/07/2021 18:29    Labs on Admission: I have personally reviewed following labs CBC: Recent Labs  Lab 08/07/21 1716  WBC 19.6*  HGB 8.9*  HCT 28.5*  MCV 99.0  PLT 791   Basic Metabolic Panel: Recent Labs  Lab 08/07/21 1716  NA 137  K 4.1  CL 105  CO2 25  GLUCOSE 123*  BUN  71*  CREATININE 3.85*  CALCIUM 8.6*   GFR: CrCl cannot be calculated (Unknown ideal weight.).  Urine analysis:    Component Value Date/Time   COLORURINE YELLOW (A) 06/18/2021 2007   APPEARANCEUR CLOUDY (A) 06/18/2021 2007   LABSPEC 1.005 06/18/2021 2007   PHURINE 5.0 06/18/2021 2007   GLUCOSEU NEGATIVE 06/18/2021 2007   HGBUR NEGATIVE 06/18/2021 2007   BILIRUBINUR NEGATIVE 06/18/2021 2007   BILIRUBINUR negative 01/27/2017 [place] 06/18/2021 2007   PROTEINUR NEGATIVE 06/18/2021 2007   UROBILINOGEN negative (A) 01/27/2017 1146   NITRITE POSITIVE (A) 06/18/2021 2007   LEUKOCYTESUR SMALL (A) 06/18/2021 2007   Dr. Tobie Poet [facility]  If 7PM-7AM, please contact overnight-coverage provider If 7AM-7PM, please contact day coverage provider [email or web address]  08/07/2021, 9:14 PM

## 2021-08-08 ENCOUNTER — Encounter: Payer: Self-pay | Admitting: Internal Medicine

## 2021-08-08 DIAGNOSIS — A419 Sepsis, unspecified organism: Principal | ICD-10-CM

## 2021-08-08 DIAGNOSIS — J189 Pneumonia, unspecified organism: Secondary | ICD-10-CM | POA: Diagnosis not present

## 2021-08-08 DIAGNOSIS — D649 Anemia, unspecified: Secondary | ICD-10-CM | POA: Diagnosis not present

## 2021-08-08 DIAGNOSIS — I10 Essential (primary) hypertension: Secondary | ICD-10-CM | POA: Diagnosis not present

## 2021-08-08 LAB — CBC
HCT: 24.8 % — ABNORMAL LOW (ref 36.0–46.0)
Hemoglobin: 7.9 g/dL — ABNORMAL LOW (ref 12.0–15.0)
MCH: 30.9 pg (ref 26.0–34.0)
MCHC: 31.9 g/dL (ref 30.0–36.0)
MCV: 96.9 fL (ref 80.0–100.0)
Platelets: 289 10*3/uL (ref 150–400)
RBC: 2.56 MIL/uL — ABNORMAL LOW (ref 3.87–5.11)
RDW: 13.2 % (ref 11.5–15.5)
WBC: 11.5 10*3/uL — ABNORMAL HIGH (ref 4.0–10.5)
nRBC: 0 % (ref 0.0–0.2)

## 2021-08-08 LAB — BASIC METABOLIC PANEL
Anion gap: 8 (ref 5–15)
BUN: 64 mg/dL — ABNORMAL HIGH (ref 8–23)
CO2: 24 mmol/L (ref 22–32)
Calcium: 8.4 mg/dL — ABNORMAL LOW (ref 8.9–10.3)
Chloride: 109 mmol/L (ref 98–111)
Creatinine, Ser: 3.4 mg/dL — ABNORMAL HIGH (ref 0.44–1.00)
GFR, Estimated: 12 mL/min — ABNORMAL LOW (ref >60)
Glucose, Bld: 95 mg/dL (ref 70–99)
Potassium: 3.8 mmol/L (ref 3.5–5.1)
Sodium: 141 mmol/L (ref 135–145)

## 2021-08-08 LAB — VITAMIN B12: Vitamin B-12: 561 pg/mL (ref 180–914)

## 2021-08-08 LAB — MRSA NEXT GEN BY PCR, NASAL: MRSA by PCR Next Gen: NOT DETECTED

## 2021-08-08 MED ORDER — FERROUS SULFATE 325 (65 FE) MG PO TABS
325.0000 mg | ORAL_TABLET | Freq: Two times a day (BID) | ORAL | Status: DC
  Administered 2021-08-08 – 2021-08-10 (×5): 325 mg via ORAL
  Filled 2021-08-08 (×4): qty 1

## 2021-08-08 MED ORDER — CALCITRIOL 0.25 MCG PO CAPS
0.2500 ug | ORAL_CAPSULE | ORAL | Status: DC
  Administered 2021-08-09: 0.25 ug via ORAL
  Filled 2021-08-08: qty 1

## 2021-08-08 MED ORDER — BENZONATATE 100 MG PO CAPS: 100.0000 mg | ORAL_CAPSULE | Freq: Four times a day (QID) | ORAL | Status: DC | PRN

## 2021-08-08 MED ORDER — HYDRALAZINE HCL 50 MG PO TABS: 25.0000 mg | ORAL_TABLET | Freq: Three times a day (TID) | ORAL | Status: DC | PRN

## 2021-08-08 MED ORDER — SODIUM BICARBONATE 650 MG PO TABS
650.0000 mg | ORAL_TABLET | Freq: Every day | ORAL | Status: DC
  Administered 2021-08-08 – 2021-08-09 (×2): 650 mg via ORAL
  Filled 2021-08-08 (×3): qty 1

## 2021-08-08 MED ORDER — CLOPIDOGREL BISULFATE 75 MG PO TABS
75.0000 mg | ORAL_TABLET | Freq: Every day | ORAL | Status: DC
  Administered 2021-08-08: 75 mg via ORAL
  Filled 2021-08-08: qty 1

## 2021-08-08 MED ORDER — ROSUVASTATIN CALCIUM 10 MG PO TABS
10.0000 mg | ORAL_TABLET | Freq: Every day | ORAL | Status: DC
  Administered 2021-08-08 – 2021-08-09 (×2): 10 mg via ORAL
  Filled 2021-08-08 (×3): qty 1

## 2021-08-08 MED ORDER — ALLOPURINOL 100 MG PO TABS
50.0000 mg | ORAL_TABLET | Freq: Every day | ORAL | Status: DC
  Administered 2021-08-09 – 2021-08-10 (×2): 50 mg via ORAL
  Filled 2021-08-08 (×2): qty 1

## 2021-08-08 MED ORDER — RALOXIFENE HCL 60 MG PO TABS
60.0000 mg | ORAL_TABLET | Freq: Every day | ORAL | Status: DC
  Administered 2021-08-08 – 2021-08-09 (×2): 60 mg via ORAL
  Filled 2021-08-08 (×3): qty 1

## 2021-08-08 MED ORDER — ISOSORBIDE MONONITRATE ER 30 MG PO TB24
60.0000 mg | ORAL_TABLET | Freq: Every day | ORAL | Status: DC
  Administered 2021-08-08 – 2021-08-09 (×2): 60 mg via ORAL
  Filled 2021-08-08 (×2): qty 2

## 2021-08-08 MED ORDER — AMLODIPINE BESYLATE 5 MG PO TABS: 5.0000 mg | ORAL_TABLET | Freq: Every day | ORAL | Status: DC

## 2021-08-08 NOTE — Assessment & Plan Note (Signed)
Stable, no acute flare symptoms. -- Continue allopurinol

## 2021-08-08 NOTE — Assessment & Plan Note (Signed)
On statin.

## 2021-08-08 NOTE — Evaluation (Signed)
Clinical/Bedside Swallow Evaluation Patient Details  Name: Crystal Faulkner MRN: [id number] Date of Birth: 1925-01-09  Today's Date: 08/08/2021 Time: SLP Start Time (ACUTE ONLY): 14 SLP Stop Time (ACUTE ONLY): 59 SLP Time Calculation (min) (ACUTE ONLY): 26 min  Past Medical History:  Past Medical History:  Diagnosis Date   Allergy    Alzheimer's disease ([place])    Anemia    CCF (congestive cardiac failure) (HCC)    Chronic kidney disease    Chronic stable angina (HCC)    Frequent falls    Gout    H/O: GI bleed    Hypercholesteremia    Hyperglycemia    Hyperlipidemia    Hypertension    Incontinence    Mixed dementia ([place])    Myocardial infarction (HCC)    Numbness of right hand    Osteoporosis    Right hand weakness    Stroke ([place])    Thyroid disease    Vitamin D deficiency    Past Surgical History:  Past Surgical History:  Procedure Laterality Date   EXPLORATORY LAPAROTOMY     EYE SURGERY Bilateral    cataract   HPI:  Crystal Faulkner is a 87 y.o. female with medical history significant for Hypertension, history of gout, CAD status post PCI via DES in 2004, hypothyroid, GERD, hyperlipidemia, dementia, who presents to the emergency department for chief concerns of generalized weakness. Head CT revealed Old left MCA infarct with encephalomalacia in the left  parietal and frontal lobes, unchanged. There is atrophy and chronic small vessel disease changes. No acute intracranial abnormality. Chest x-ray revealed right lower lobe airspace opacity concerning for pneumonia, small left pleural effusion and large hiatal hernia.    Assessment / Plan / Recommendation  Clinical Impression  Pt's daughter present throughout evaluation and states that pt consumes minced food, thin liquids via cup and medicine crushed in puree at home. Pt was laying on her right side and was total assistance for repositioning bed and after interaction, pt moved to her right side again. Pt's daughter  helped pt consume thin liquids via cup as well as trial of dysphagia 2 snack and puree. Pt's oral phase appeared functional with the appearance of swift swallow and not overt s/s of aspiration when consuming thin liquids via cup. While silent aspiration cannot be ruled out at bedside, suspect correlation between right lower lobe pneumonia + large hiatal hernia + pt's preference to lay on her right + decrease adherence to reflux precautions. Education provided to pt's daughter on strict reflux precautions. Pt's oropharyngeal abilities appear at baseline, no further ST intervention is indicated at this time. SLP Visit Diagnosis: Dysphagia, unspecified (R13.10)    Aspiration Risk  Severe aspiration risk    Diet Recommendation Dysphagia 2 (Fine chop);Thin liquid   Liquid Administration via: Cup Medication Administration: Crushed with puree Supervision: Staff to assist with self feeding;Full supervision/cueing for compensatory strategies Compensations: Minimize environmental distractions;Slow rate;Small sips/bites Postural Changes: Seated upright at 90 degrees;Remain upright for at least 30 minutes after po intake    Other  Recommendations Oral Care Recommendations: Oral care BID    Recommendations for follow up therapy are one component of a multi-disciplinary discharge planning process, led by the attending physician.  Recommendations may be updated based on patient status, additional functional criteria and insurance authorization.  Follow up Recommendations No SLP follow up      Assistance Recommended at Discharge None  Functional Status Assessment Patient has had a recent decline in their functional status  and demonstrates the ability to make significant improvements in function in a reasonable and predictable amount of time.  Frequency and Duration   N/A         Prognosis   N/A     Swallow Study   General Date of Onset: 08/07/21 HPI: Crystal Faulkner is a 87 y.o. female with  medical history significant for Hypertension, history of gout, CAD status post PCI via DES in 2004, hypothyroid, GERD, hyperlipidemia, dementia, who presents to the emergency department for chief concerns of generalized weakness. Head CT revealed Old left MCA infarct with encephalomalacia in the left  parietal and frontal lobes, unchanged. There is atrophy and chronic small vessel disease changes. No acute intracranial abnormality. Chest x-ray revealed right lower lobe airspace opacity concerning for pneumonia, small left pleural effusion and large hiatal hernia. Type of Study: Bedside Swallow Evaluation Previous Swallow Assessment: multiple - dysphagia 2 diet with thin liquids Diet Prior to this Study: Regular;Nectar-thick liquids Temperature Spikes Noted: No Respiratory Status: Room air History of Recent Intubation: No Behavior/Cognition: Confused;Doesn't follow directions Oral Cavity Assessment: Within Functional Limits Oral Care Completed by SLP: Recent completion by staff Oral Cavity - Dentition: Edentulous Self-Feeding Abilities: Total assist Patient Positioning: Upright in bed Baseline Vocal Quality: Not observed Volitional Cough: Cognitively unable to elicit Volitional Swallow: Unable to elicit    Oral/Motor/Sensory Function Overall Oral Motor/Sensory Function: Generalized oral weakness   Ice Chips Ice chips: Not tested   Thin Liquid Thin Liquid: Within functional limits Presentation: Cup    Nectar Thick Nectar Thick Liquid: Not tested   Honey Thick Honey Thick Liquid: Not tested   Puree Puree: Within functional limits Presentation: Spoon   Solid     Solid: Within functional limits Presentation: Spoon Other Comments: dysphagia 2 snack - pt's basline is minced at home     Crystal Faulkner B. Rutherford Nail M.S., CCC-SLP, [place] Office [phone]  Crystal Faulkner Rutherford Nail 08/08/2021,1:03 PM

## 2021-08-08 NOTE — Assessment & Plan Note (Signed)
Continue levothyroxine 

## 2021-08-08 NOTE — Progress Notes (Signed)
Attempted this shift to try and collect a sputum sample. Patient is unable to comprehend to spit sputum into the cup. Patient is coughing up, however is swallowing it back down. Will notify MD

## 2021-08-08 NOTE — Progress Notes (Signed)
Progress Note    Crystal Faulkner   UVO:[id number]  DOB: April 28, 1925  DOA: 08/07/2021     0 Date of Service: 08/08/2021   Brief narrative 87 year old female with past medical history of hypertension, gout, CAD status post stent in 2004, hypothyroidism, GERD and peptic ulcer disease, hyperlipidemia, dementia who presented to the ED on 08/07/2021 with concerns of progressive generalized weakness.  Family also reported patient having a dry cough.  Patient had tested positive for influenza A on 07/23/2021.  Patient met sepsis criteria in the ED with tachypnea and leukocytosis.  Chest x-ray showed a right lower lobe infiltrate consistent with pneumonia.  She was started on empiric IV antibiotics and admitted to hospitalist service for further evaluation management.    Assessment and Plan * Community acquired pneumonia Present on admission, in the setting of recent influenza A infection, 07/23/2021.  Patient met sepsis criteria on admission. -- Continue Rocephin and Zithromax -- MRSA screen negative -- Antitussives as needed -- Incentive spirometry and flutter valve -- Sputum culture ordered but per family cough has been nonproductive -- Monitor CBC -- Aspiration precautions  Hemiparesis affecting right side as late effect of cerebrovascular accident (CVA) ([place]) Stable no acute issues.  Monitor.  Sepsis ([place]) Present on admission with leukocytosis, tachypnea and right lower lobe pneumonia on chest x-ray. Sepsis physiology resolved. -- Management as outlined, see community-acquired pneumonia -- Follow cultures  Chronic diastolic CHF (congestive heart failure) ([place]) Patient appears euvolemic, well compensated at this time. -- Home Lasix on hold, likely resume tomorrow -- Monitor volume status  CKD (chronic kidney disease) stage 5, GFR less than 15 ml/min (HCC) Renal function stable and near baseline. -- Monitor BMP  Anemia of chronic disease Hemoglobin fairly stable.  Presented  with hemoglobin 8.9, this morning 7.9, suspect dilutional with IV fluids per sepsis protocol. No evidence of bleeding. -- Monitor CBC -- Continue home iron supplement  Thoracic aorta atherosclerosis (HCC) On statin  Chronic stable angina (HCC) No active chest pain. -- Continue Imdur and statin  HLD (hyperlipidemia) Continue Crestor  Benign hypertension Home regimen appears to be Lasix and Imdur.  Lasix held on admission due to sepsis, given 500 cc normal saline bolus in the ED. -- Continue Imdur -- Likely resume Lasix tomorrow -- As needed hydralazine for SBP over 160 -- Monitor BP  Controlled gout Stable, no acute flare symptoms. -- Continue allopurinol  History of peptic ulcer disease Stable without acute issues. -- Continue PPI  H/O gastrointestinal hemorrhage No acute issues.  Monitor.  Adult hypothyroidism Continue levothyroxine  Mild cognitive disorder Appears at cognitive baseline. -- Delirium precautions  Osteoporosis -- Fall precautions  Secondary hyperparathyroidism ([place]) Due to CKD.     Subjective:  Patient seen with daughter at bedside eating lunch today.  Per daughter, patient seems back to near baseline.  She was seen by his speech pathologist for swallow evaluation earlier and cleared for thin liquids and dysphagia diet.  Daughter reports tolerating this well.  Patient mostly nonverbal and does not respond, history obtained from daughter.  Apparently no fevers or chills and cough daughter is observed has seemed dry and nonproductive.  Objective Vitals:   08/08/21 0121 08/08/21 0558 08/08/21 0647 08/08/21 1200  BP: (!) 144/74 123/72 (!) 169/91 (!) 155/88  Pulse: 78 75 82 83  Resp: _0 Temp:  98 F (36.7 C) 98.3 F (36.8 C) 98.4 F (36.9 C)  TempSrc:  Axillary  Oral  SpO2: 99% 98% 97% 97%  Weight: 52.2 kg     Height: 5' (1.524 m)      52.2 kg  Vital signs were reviewed and unremarkable except for: Blood pressures  intermittently elevated   Exam General exam: awake, alert, no acute distress, nonverbal Respiratory system: CTAB with diminished right base, no wheezes, rales or rhonchi, normal respiratory effort, on room air. Cardiovascular system: normal S1/S2, RRR, no JVD, murmurs, rubs, gallops, no pedal edema.   Gastrointestinal system: soft, NT, ND, no HSM felt, +bowel sounds. Central nervous system: no gross focal neurologic deficits, exam limited by patient's cognition but does follow commands appropriately Extremities: moves all, no edema, normal tone Skin: dry, intact, normal temperature Psychiatry: normal mood, congruent affect, judgement and insight appear normal   Labs / Other Information My review of labs, imaging, notes and other tests is significant for Creatinine improved from 3.85-3.40, BUN 64, calcium 8.4, albumin 2.2.  Lactic acid level normalized.  Vitamin B12 level normal.  Leukocytosis improved to 11.5.  Hemoglobin down from 8.9-7.9.   MRSA nares screen negative   Disposition Plan: Status is: Observation  The patient remains OBS appropriate and will d/c before 2 midnights.      Time spent: 30 minutes [facility] 08/08/2021, 2:54 PM

## 2021-08-08 NOTE — Assessment & Plan Note (Signed)
No acute issues. Monitor. 

## 2021-08-08 NOTE — Assessment & Plan Note (Signed)
Present on admission, in the setting of recent influenza A infection, 07/23/2021.  Patient met sepsis criteria on admission. -- Continue Rocephin and Zithromax -- MRSA screen negative -- Antitussives as needed -- Incentive spirometry and flutter valve -- Sputum culture ordered but per family cough has been nonproductive -- Monitor CBC -- Aspiration precautions

## 2021-08-08 NOTE — Assessment & Plan Note (Signed)
Renal function stable and near baseline. Monitor BMP 

## 2021-08-08 NOTE — Assessment & Plan Note (Addendum)
Hemoglobin fairly stable.  Presented with hemoglobin 8.9, this morning 7.9, suspect dilutional with IV fluids per sepsis protocol. No evidence of bleeding. -- Monitor CBC -- Continue home iron supplement

## 2021-08-08 NOTE — Assessment & Plan Note (Signed)
Stable no acute issues.  Monitor.

## 2021-08-08 NOTE — Assessment & Plan Note (Signed)
Due to CKD ?

## 2021-08-08 NOTE — Assessment & Plan Note (Signed)
No active chest pain. -- Continue Imdur and statin

## 2021-08-08 NOTE — Assessment & Plan Note (Signed)
Stable without acute issues. -- Continue PPI

## 2021-08-08 NOTE — Assessment & Plan Note (Signed)
Present on admission with leukocytosis, tachypnea and right lower lobe pneumonia on chest x-ray. Sepsis physiology resolved. -- Management as outlined, see community-acquired pneumonia -- Follow cultures

## 2021-08-08 NOTE — Assessment & Plan Note (Signed)
Continue Crestor 

## 2021-08-08 NOTE — Assessment & Plan Note (Signed)
Appears at cognitive baseline. -- Delirium precautions

## 2021-08-08 NOTE — Assessment & Plan Note (Signed)
Fall precautions.  

## 2021-08-08 NOTE — Hospital Course (Signed)
87 year old female with past medical history of hypertension, gout, CAD status post stent in 2004, hypothyroidism, GERD and peptic ulcer disease, hyperlipidemia, dementia who presented to the ED on 08/07/2021 with concerns of progressive generalized weakness.  Family also reported patient having a dry cough.  Patient had tested positive for influenza A on 07/23/2021.  Patient met sepsis criteria in the ED with tachypnea and leukocytosis.  Chest x-ray showed a right lower lobe infiltrate consistent with pneumonia.  She was started on empiric IV antibiotics and admitted to hospitalist service for further evaluation management.

## 2021-08-08 NOTE — Assessment & Plan Note (Signed)
Home regimen appears to be Lasix and Imdur.  Lasix held on admission due to sepsis, given 500 cc normal saline bolus in the ED. -- Continue Imdur -- Likely resume Lasix tomorrow -- As needed hydralazine for SBP over 160 -- Monitor BP

## 2021-08-08 NOTE — Assessment & Plan Note (Signed)
Patient appears euvolemic, well compensated at this time. -- Home Lasix on hold, likely resume tomorrow -- Monitor volume status

## 2021-08-09 DIAGNOSIS — I25118 Atherosclerotic heart disease of native coronary artery with other forms of angina pectoris: Secondary | ICD-10-CM | POA: Diagnosis present

## 2021-08-09 DIAGNOSIS — N2581 Secondary hyperparathyroidism of renal origin: Secondary | ICD-10-CM | POA: Diagnosis present

## 2021-08-09 DIAGNOSIS — D649 Anemia, unspecified: Secondary | ICD-10-CM | POA: Diagnosis not present

## 2021-08-09 DIAGNOSIS — Z79890 Hormone replacement therapy (postmenopausal): Secondary | ICD-10-CM | POA: Diagnosis not present

## 2021-08-09 DIAGNOSIS — F0390 Unspecified dementia without behavioral disturbance: Secondary | ICD-10-CM | POA: Diagnosis present

## 2021-08-09 DIAGNOSIS — M109 Gout, unspecified: Secondary | ICD-10-CM | POA: Diagnosis present

## 2021-08-09 DIAGNOSIS — N185 Chronic kidney disease, stage 5: Secondary | ICD-10-CM | POA: Diagnosis present

## 2021-08-09 DIAGNOSIS — Z955 Presence of coronary angioplasty implant and graft: Secondary | ICD-10-CM | POA: Diagnosis not present

## 2021-08-09 DIAGNOSIS — Z20822 Contact with and (suspected) exposure to covid-19: Secondary | ICD-10-CM | POA: Diagnosis present

## 2021-08-09 DIAGNOSIS — I132 Hypertensive heart and chronic kidney disease with heart failure and with stage 5 chronic kidney disease, or end stage renal disease: Secondary | ICD-10-CM | POA: Diagnosis present

## 2021-08-09 DIAGNOSIS — Z8711 Personal history of peptic ulcer disease: Secondary | ICD-10-CM | POA: Diagnosis not present

## 2021-08-09 DIAGNOSIS — K219 Gastro-esophageal reflux disease without esophagitis: Secondary | ICD-10-CM | POA: Diagnosis present

## 2021-08-09 DIAGNOSIS — N8184 Pelvic muscle wasting: Secondary | ICD-10-CM | POA: Diagnosis present

## 2021-08-09 DIAGNOSIS — A419 Sepsis, unspecified organism: Secondary | ICD-10-CM | POA: Diagnosis present

## 2021-08-09 DIAGNOSIS — E039 Hypothyroidism, unspecified: Secondary | ICD-10-CM | POA: Diagnosis present

## 2021-08-09 DIAGNOSIS — M810 Age-related osteoporosis without current pathological fracture: Secondary | ICD-10-CM | POA: Diagnosis present

## 2021-08-09 DIAGNOSIS — I5032 Chronic diastolic (congestive) heart failure: Secondary | ICD-10-CM | POA: Diagnosis present

## 2021-08-09 DIAGNOSIS — I10 Essential (primary) hypertension: Secondary | ICD-10-CM | POA: Diagnosis not present

## 2021-08-09 DIAGNOSIS — J189 Pneumonia, unspecified organism: Secondary | ICD-10-CM | POA: Diagnosis present

## 2021-08-09 DIAGNOSIS — I700 Atherosclerosis of aorta: Secondary | ICD-10-CM | POA: Diagnosis present

## 2021-08-09 DIAGNOSIS — Z66 Do not resuscitate: Secondary | ICD-10-CM | POA: Diagnosis present

## 2021-08-09 DIAGNOSIS — Z79899 Other long term (current) drug therapy: Secondary | ICD-10-CM | POA: Diagnosis not present

## 2021-08-09 DIAGNOSIS — I69351 Hemiplegia and hemiparesis following cerebral infarction affecting right dominant side: Secondary | ICD-10-CM | POA: Diagnosis not present

## 2021-08-09 DIAGNOSIS — D631 Anemia in chronic kidney disease: Secondary | ICD-10-CM | POA: Diagnosis present

## 2021-08-09 DIAGNOSIS — E785 Hyperlipidemia, unspecified: Secondary | ICD-10-CM | POA: Diagnosis present

## 2021-08-09 DIAGNOSIS — Z7902 Long term (current) use of antithrombotics/antiplatelets: Secondary | ICD-10-CM | POA: Diagnosis not present

## 2021-08-09 LAB — IRON AND TIBC
Iron: 30 ug/dL (ref 28–170)
Saturation Ratios: 18 % (ref 10.4–31.8)
TIBC: 164 ug/dL — ABNORMAL LOW (ref 250–450)
UIBC: 134 ug/dL

## 2021-08-09 LAB — RETICULOCYTES
Immature Retic Fract: 29.7 % — ABNORMAL HIGH (ref 2.3–15.9)
RBC.: 2.39 MIL/uL — ABNORMAL LOW (ref 3.87–5.11)
Retic Count, Absolute: 50.7 10*3/uL (ref 19.0–186.0)
Retic Ct Pct: 2.1 % (ref 0.4–3.1)

## 2021-08-09 LAB — VITAMIN B12: Vitamin B-12: 402 pg/mL (ref 180–914)

## 2021-08-09 LAB — CBC
HCT: 22.5 % — ABNORMAL LOW (ref 36.0–46.0)
Hemoglobin: 7 g/dL — ABNORMAL LOW (ref 12.0–15.0)
MCH: 30.3 pg (ref 26.0–34.0)
MCHC: 31.1 g/dL (ref 30.0–36.0)
MCV: 97.4 fL (ref 80.0–100.0)
Platelets: 279 10*3/uL (ref 150–400)
RBC: 2.31 MIL/uL — ABNORMAL LOW (ref 3.87–5.11)
RDW: 13.3 % (ref 11.5–15.5)
WBC: 8.8 10*3/uL (ref 4.0–10.5)
nRBC: 0 % (ref 0.0–0.2)

## 2021-08-09 LAB — FOLATE: Folate: 14.5 ng/mL (ref >5.9)

## 2021-08-09 LAB — BASIC METABOLIC PANEL
Anion gap: 8 (ref 5–15)
BUN: 64 mg/dL — ABNORMAL HIGH (ref 8–23)
CO2: 23 mmol/L (ref 22–32)
Calcium: 8.2 mg/dL — ABNORMAL LOW (ref 8.9–10.3)
Chloride: 108 mmol/L (ref 98–111)
Creatinine, Ser: 3.29 mg/dL — ABNORMAL HIGH (ref 0.44–1.00)
GFR, Estimated: 13 mL/min — ABNORMAL LOW (ref >60)
Glucose, Bld: 84 mg/dL (ref 70–99)
Potassium: 3.8 mmol/L (ref 3.5–5.1)
Sodium: 139 mmol/L (ref 135–145)

## 2021-08-09 LAB — FERRITIN: Ferritin: 61 ng/mL (ref 11–307)

## 2021-08-09 LAB — HEMOGLOBIN AND HEMATOCRIT, BLOOD
HCT: 30 % — ABNORMAL LOW (ref 36.0–46.0)
Hemoglobin: 9.3 g/dL — ABNORMAL LOW (ref 12.0–15.0)

## 2021-08-09 MED ORDER — POLYETHYLENE GLYCOL 3350 17 G PO PACK
17.0000 g | PACK | Freq: Every day | ORAL | Status: DC
Start: 2021-08-09 — End: 2021-08-10
  Administered 2021-08-09 – 2021-08-10 (×2): 17 g via ORAL
  Filled 2021-08-09 (×2): qty 1

## 2021-08-09 MED ORDER — SENNOSIDES-DOCUSATE SODIUM 8.6-50 MG PO TABS
1.0000 | ORAL_TABLET | Freq: Two times a day (BID) | ORAL | Status: DC
  Administered 2021-08-09 – 2021-08-10 (×3): 1 via ORAL
  Filled 2021-08-09 (×2): qty 1

## 2021-08-09 MED ORDER — GUAIFENESIN 100 MG/5ML PO LIQD
5.0000 mL | ORAL | Status: DC | PRN
  Administered 2021-08-09: 5 mL via ORAL
  Filled 2021-08-09 (×3): qty 10

## 2021-08-09 NOTE — Assessment & Plan Note (Signed)
Due to CKD ?

## 2021-08-09 NOTE — Assessment & Plan Note (Signed)
Present on admission with leukocytosis, tachypnea and right lower lobe pneumonia on chest x-ray. Sepsis physiology resolved. -- Management as outlined, see community-acquired pneumonia -- Follow cultures

## 2021-08-09 NOTE — Evaluation (Signed)
Physical Therapy Evaluation Patient Details Name: Crystal Faulkner MRN: [id number] DOB: [birth year] Today's Date: 08/09/2021  History of Present Illness  Pt is a 87 y/o F admitted on 08/07/21 after presenting to the ED with concerns of progressive generalized weakness. Pt recently tested positive for Influenza on A on 07/23/21. Pt met sepsis criteria with tachypnea & leukocytosis, chest x-ray showed R lower lobe infiltrate consistent with PNA. PMH: HTN, gout, CAD s/p stent 2004, hypothyroidism, GERD, peptic ulcer disease, HLD, dementia, L MCA infarct with R hemiplegia  Clinical Impression  Pt seen for PT evaluation with daughters Crystal Faulkner present. Per daughters, pt was ambulatory with RW & negotiated stairs into home with 1 person assist with no hx of falls. Pt is also nonverbal 2/2 hx of CVA. On this date, pt requires min assist for rolling L<>R & max assist for supine>sit. Pt is able to ambulate short distance in room with RW & min/mod assist but does fatigue. Discussed DME needs & Crystal Faulkner declines Crystal Faulkner & hospital bed with both daughters voicing they feel able to assist pt up/down stairs into home upon d/c. Will continue to follow pt acutely to progress gait with LRAD & address balance & endurance.     Recommendations for follow up therapy are one component of a multi-disciplinary discharge planning process, led by the attending physician.  Recommendations may be updated based on patient status, additional functional criteria and insurance authorization.  Follow Up Recommendations Home health PT    Assistance Recommended at Discharge Frequent or constant Supervision/Assistance  Functional Status Assessment Patient has had a recent decline in their functional status and demonstrates the ability to make significant improvements in function in a reasonable and predictable amount of time. (minimal decline)  Equipment Recommendations  None recommended by PT (pt's daughter declines hospital  bed & [facility])    Recommendations for Other Services       Precautions / Restrictions Precautions Precautions: Fall Precaution Comments: old R hemi, nonverbal Restrictions Weight Bearing Restrictions: No      Mobility  Bed Mobility Overal bed mobility: Needs Assistance Bed Mobility: Supine to Sit;Rolling Rolling: Min assist (cuing to use bed rails)   Supine to sit: Max assist;HOB elevated     General bed mobility comments: cuing to use bed rails, assistance to move BLE to EOB & upright trunk    Transfers Overall transfer level: Needs assistance Equipment used: Rolling walker (2 wheels) Transfers: Sit to/from Stand Sit to Stand: Mod assist                Ambulation/Gait Ambulation/Gait assistance: Min assist;Mod assist Gait Distance (Feet): 12 Feet Assistive device: Rolling walker (2 wheels) Gait Pattern/deviations: Decreased step length - right;Decreased step length - left;Decreased dorsiflexion - right;Decreased dorsiflexion - left;Decreased stride length Gait velocity: decreased     General Gait Details: Slightly decreased balance when turning.  Stairs            Wheelchair Mobility    Modified Rankin (Stroke Patients Only)       Balance Overall balance assessment: Needs assistance Sitting-balance support: Feet supported;Bilateral upper extremity supported Sitting balance-Leahy Scale: Poor     Standing balance support: Bilateral upper extremity supported;During functional activity Standing balance-Leahy Scale: Poor                               Pertinent Vitals/Pain Pain Assessment: Faces Faces Pain Scale: Hurts a little bit Pain Location: generalized Pain  Descriptors / Indicators: Grimacing;Discomfort Pain Intervention(s): Limited activity within patient's tolerance;Monitored during session;Repositioned    Home Living Family/patient expects to be discharged to:: Private residence Living Arrangements: Children Available Help  at Discharge: Family;Available 24 hours/day Type of Home: House Home Access: Stairs to enter Entrance Stairs-Rails: Right;Left;Can reach both Entrance Stairs-Number of Steps: 4   Home Layout: One level Home Equipment: Conservation officer, nature (2 wheels);Tub bench;Wheelchair - manual      Prior Function Prior Level of Function : Needs assist             Mobility Comments: Pt was ambulatory & negotiates stairs with RW & rails with min assist from daughter. Uses w/c in community. ADLs Comments: Daughter assists with peri hygiene & toileting (pt uses depends), provides all meals for pt but pt can feed herself.     Hand Dominance        Extremity/Trunk Assessment   Upper Extremity Assessment Upper Extremity Assessment:  (R fingers, wrist & elbow rest in flexed position but pt able to grasp RW)    Lower Extremity Assessment Lower Extremity Assessment: RLE deficits/detail RLE Deficits / Details: R foot inverts when lying in bed but pt able to place foot flat on floor during standing/gait. Family reports pt has a brace she can use but it doesn't fit in her shoes (recommend they buy 1/2 size larger). Minimal RLE dorsiflexion.       Communication   Communication: Expressive difficulties (Family reports pt has been non verbal since prior CVA. Follows commands with multimodal cuing)  Cognition Arousal/Alertness: Awake/alert Behavior During Therapy: WFL for tasks assessed/performed Overall Cognitive Status: History of cognitive impairments - at baseline                                 General Comments: Pt nonverbal but follows commands with multimodal cuing throughout session, frequently smiles at PT/staff.        General Comments General comments (skin integrity, edema, etc.): Upon PT arrival, PT assists pt with rolling while NT completes peri hygiene.    Exercises     Assessment/Plan    PT Assessment Patient needs continued PT services  PT Problem List Decreased  strength;Decreased mobility;Decreased safety awareness;Decreased activity tolerance;Decreased balance;Decreased knowledge of use of DME;Decreased skin integrity;Decreased coordination       PT Treatment Interventions DME instruction;Therapeutic exercise;Gait training;Balance training;Stair training;Neuromuscular re-education;Functional mobility training;Therapeutic activities;Patient/family education    PT Goals (Current goals can be found in the Care Plan section)  Acute Rehab PT Goals Patient Stated Goal: get better, go home PT Goal Formulation: With family Time For Goal Achievement: 08/23/21 Potential to Achieve Goals: Fair    Frequency Min 2X/week   Barriers to discharge        Co-evaluation               AM-PAC PT "6 Clicks" Mobility  Outcome Measure Help needed turning from your back to your side while in a flat bed without using bedrails?: A Little Help needed moving from lying on your back to sitting on the side of a flat bed without using bedrails?: Total Help needed moving to and from a bed to a chair (including a wheelchair)?: A Lot Help needed standing up from a chair using your arms (e.g., wheelchair or bedside chair)?: A Lot Help needed to walk in hospital room?: A Lot Help needed climbing 3-5 steps with a railing? : Total 6 Click Score: 11  End of Session Equipment Utilized During Treatment: Gait belt Activity Tolerance: Patient tolerated treatment well Patient left: in chair;with chair alarm set;with call bell/phone within reach;with family/visitor present Nurse Communication: Mobility status PT Visit Diagnosis: Muscle weakness (generalized) (M62.81);Difficulty in walking, not elsewhere classified (R26.2);Unsteadiness on feet (R26.81)    Time: 6812-7517 PT Time Calculation (min) (ACUTE ONLY): 25 min   Charges:   PT Evaluation $PT Eval Moderate Complexity: 1 Mod PT Treatments $Therapeutic Activity: 8-22 mins        Lavone Nian, PT,  DPT 08/09/21, 1:44 PM   Waunita Schooner 08/09/2021, 1:43 PM

## 2021-08-09 NOTE — Assessment & Plan Note (Signed)
Stable no acute issues.  Monitor.

## 2021-08-09 NOTE — Assessment & Plan Note (Signed)
Hbg this AM down to 7.0.  On admission Hbg was 8.9, recently was 10.1 on 06/18/21. Three point drop in past month & a half. Dark stools at baseline due to oral iron supplement. Anemia panel unremarkable. Pt does have remote hx of GI bleeding. --H&H at noon today --Transfuse if Hbg < 7.0 (family consented) --Daily CBC's --GI evaluation if Hbg continues to drop or if transfusion needed (family agreeable)

## 2021-08-09 NOTE — Assessment & Plan Note (Signed)
Patient appears euvolemic, well compensated at this time. -- Home Lasix on hold, likely resume tomorrow -- Monitor volume status

## 2021-08-09 NOTE — Assessment & Plan Note (Signed)
Now with Hbg declining.  Monitor. Mgmt as outlined above.

## 2021-08-09 NOTE — Assessment & Plan Note (Signed)
Continue levothyroxine 

## 2021-08-09 NOTE — Assessment & Plan Note (Signed)
Continue Crestor 

## 2021-08-09 NOTE — Assessment & Plan Note (Signed)
On statin.

## 2021-08-09 NOTE — Assessment & Plan Note (Signed)
Stable, no acute flare symptoms. -- Continue allopurinol

## 2021-08-09 NOTE — Assessment & Plan Note (Signed)
No active chest pain. -- Continue Imdur and statin

## 2021-08-09 NOTE — Assessment & Plan Note (Signed)
Appears at cognitive baseline. -- Delirium precautions

## 2021-08-09 NOTE — TOC Initial Note (Signed)
Transition of Care [facility]) - Initial/Assessment Note    Patient Details  Name: Crystal Faulkner MRN: [id number] Date of Birth: 01-Nov-1924  [facility] CM/SW Contact:    Kerin Salen, RN Phone Number: 08/09/2021, 1:27 PM  Clinical Narrative: Patient asleep, daughter [name] at bedside. [name] indicates that she is the primary care taker, lives in the home with patient and is there 24/7. [name] says patient is non-verbal due to a stroke however able to walk with rolling walker and her assistance. [name] provides assistance with ADL's, [facility] [address] And transportation to medical visits PCP, Dr. Marcina Millard at [facility]. [name] says she is ok with taking care of mother and has everything needed. Denies HHPT and other equipment offered, BSC, 3in1. No TOC needs assessed at this time.                  Expected Discharge Plan: Home/Self Care Barriers to Discharge: Continued Medical Work up   Patient Goals and CMS Choice Patient states their goals for this hospitalization and ongoing recovery are:: To return home per daughter, [name]      Expected Discharge Plan and Services Expected Discharge Plan: Home/Self Care       Living arrangements for the past 2 months: Mobile Home                           HH Arranged: Refused HH          Prior Living Arrangements/Services Living arrangements for the past 2 months: Mobile Home Lives with:: Adult Children Patient language and need for interpreter reviewed:: No (Spoke with daughter, patient is non-verbal)        Need for Family Participation in Patient Care: Yes (Comment) Care giver support system in place?: Yes (comment)   Criminal Activity/Legal Involvement Pertinent to Current Situation/Hospitalization: No - Comment as needed  Activities of Daily Living Home Assistive Devices/Equipment: Wheelchair, Hearing aid, Eyeglasses, Dentures (specify type) ADL Screening (condition at time  of admission) Patient's cognitive ability adequate to safely complete daily activities?: No Is the patient deaf or have difficulty hearing?: No Does the patient have difficulty seeing, even when wearing glasses/contacts?: No Does the patient have difficulty concentrating, remembering, or making decisions?: Yes Patient able to express need for assistance with ADLs?: No Does the patient have difficulty dressing or bathing?: Yes Independently performs ADLs?: No Communication: Dependent Is this a change from baseline?: Pre-admission baseline Dressing (OT): Dependent Is this a change from baseline?: Pre-admission baseline Grooming: Dependent Is this a change from baseline?: Pre-admission baseline Feeding: Independent Bathing: Dependent Is this a change from baseline?: Pre-admission baseline Toileting: Dependent Is this a change from baseline?: Pre-admission baseline In/Out Bed: Dependent Is this a change from baseline?: Pre-admission baseline Walks in Home: Dependent Is this a change from baseline?: Pre-admission baseline Does the patient have difficulty walking or climbing stairs?: Yes Weakness of Legs: Both Weakness of Arms/Hands: None  Permission Sought/Granted                  Emotional Assessment Appearance:: Appears stated age Attitude/Demeanor/Rapport: Unable to Assess (asleep and non-verbal) Affect (typically observed): Unable to Assess   Alcohol / Substance Use: Not Applicable Psych Involvement: No (comment)  Admission diagnosis:  Community acquired pneumonia [J18.9] CKD (chronic kidney disease) stage 5, GFR less than 15 ml/min (HCC) [N18.5] Generalized weakness [R53.1] Community acquired pneumonia of right lower lobe of lung [J18.9] Patient Active Problem List  Diagnosis Date Noted   Acute on chronic anemia 08/09/2021   Community acquired pneumonia 08/07/2021   Sepsis ([place]) 10/15/2018   Osteoarthritis 11/19/2017   Dysarthria due to recent cerebrovascular  accident (CVA) 06/23/2016   Hemiparesis affecting right side as late effect of cerebrovascular accident (CVA) ([place]) 06/23/2016   CKD (chronic kidney disease) stage 5, GFR less than 15 ml/min ([place]) 06/23/2016   Senile purpura ([place]) 05/13/2016   Hiatal hernia 05/04/2016   Thoracic aorta atherosclerosis ([place]) 05/04/2016   Chronic stable angina ([place]) 10/16/2015   Secondary hyperparathyroidism ([place]) 04/13/2015   Arteriosclerosis of coronary artery 04/08/2015   Benign hypertension 04/08/2015   Controlled gout 04/08/2015   History of peptic ulcer disease 04/08/2015   Adult hypothyroidism 04/08/2015   Mild cognitive disorder 04/08/2015   Anemia of chronic disease 04/08/2015   Osteoarthrosis involving more than one site but not generalized 04/08/2015   Osteoporosis 04/08/2015   Pelvic muscle wasting 04/08/2015   Allergic rhinitis 04/08/2015   Chronic diastolic CHF (congestive heart failure) ([place]) 03/29/2014   HLD (hyperlipidemia) 03/29/2014   H/O gastrointestinal hemorrhage 04/05/2007   PCP:  Steele Sizer, MD Pharmacy:   [id number] [place] [place] - [address] [place] [address] Phone: [phone] Fax: [phone]     Social Determinants of Health (SDOH) Interventions    Readmission Risk Interventions No flowsheet data found.

## 2021-08-09 NOTE — Assessment & Plan Note (Signed)
Home regimen appears to be Lasix and Imdur.  Lasix held on admission due to sepsis, given 500 cc normal saline bolus in the ED. -- Continue Imdur -- Likely resume Lasix tomorrow -- As needed hydralazine for SBP over 160 -- Monitor BP

## 2021-08-09 NOTE — Assessment & Plan Note (Signed)
Fall precautions.  

## 2021-08-09 NOTE — Assessment & Plan Note (Signed)
Stable without acute issues. -- Continue PPI

## 2021-08-09 NOTE — Assessment & Plan Note (Signed)
Present on admission, in the setting of recent influenza A infection, 07/23/2021.  Patient met sepsis criteria on admission. -- Continue Rocephin and Zithromax -- MRSA screen negative -- Antitussives as needed -- Incentive spirometry and flutter valve -- Sputum culture could not be collected due dementia, pt does not understand and swallows sputum -- Monitor CBC -- Aspiration precautions

## 2021-08-09 NOTE — Consult Note (Signed)
[place] Nurse wound consult note Consultation was completed by review of records, images and assistance from the bedside nurse/clinical staff.   Reason for Consult: skin tear Wound type:skin tear Pressure Injury POA: NA Measurement: see nursing flow sheets Wound bed:100% red Drainage (amount, consistency, odor) scant drainage, serosanguinous  Periwound: ecchymosis  Dressing procedure/placement/frequency:  Per nursing skin care order set, single layer of xeroform over skin tear. Top with foam. Change every 3 days.   .  Re consult if needed, will not follow at this time. Thanks  Omeed Osuna [employer], RN,CWOCN, CNS, [place] [phone])

## 2021-08-09 NOTE — Progress Notes (Signed)
Progress Note    Crystal Faulkner   PFX:[id number]  DOB: 08-08-1925  DOA: 08/07/2021     0 Date of Service: 08/09/2021   Brief Narrative 87 year old female with past medical history of hypertension, gout, CAD status post stent in 2004, hypothyroidism, GERD and peptic ulcer disease, hyperlipidemia, dementia who presented to the ED on 08/07/2021 with concerns of progressive generalized weakness.  Family also reported patient having a dry cough.  Patient had tested positive for influenza A on 07/23/2021.  Patient met sepsis criteria in the ED with tachypnea and leukocytosis.  Chest x-ray showed a right lower lobe infiltrate consistent with pneumonia.  She was started on empiric IV antibiotics and admitted to hospitalist service for further evaluation management.    Assessment and Plan * Community acquired pneumonia Present on admission, in the setting of recent influenza A infection, 07/23/2021.  Patient met sepsis criteria on admission. -- Continue Rocephin and Zithromax -- MRSA screen negative -- Antitussives as needed -- Incentive spirometry and flutter valve -- Sputum culture could not be collected due dementia, pt does not understand and swallows sputum -- Monitor CBC -- Aspiration precautions  Hemiparesis affecting right side as late effect of cerebrovascular accident (CVA) ([place]) Stable no acute issues.  Monitor.  Acute on chronic anemia Hbg this AM down to 7.0.  On admission Hbg was 8.9, recently was 10.1 on 06/18/21. Three point drop in past month & a half. Dark stools at baseline due to oral iron supplement. Anemia panel unremarkable. Pt does have remote hx of GI bleeding. --H&H at noon today --Transfuse if Hbg < 7.0 (family consented) --Daily CBC's --GI evaluation if Hbg continues to drop or if transfusion needed (family agreeable)  Sepsis ([place]) Present on admission with leukocytosis, tachypnea and right lower lobe pneumonia on chest x-ray. Sepsis physiology  resolved. -- Management as outlined, see community-acquired pneumonia -- Follow cultures  Chronic diastolic CHF (congestive heart failure) ([place]) Patient appears euvolemic, well compensated at this time. -- Home Lasix on hold, likely resume tomorrow -- Monitor volume status  CKD (chronic kidney disease) stage 5, GFR less than 15 ml/min (HCC) Renal function stable and near baseline. -- Monitor BMP  Anemia of chronic disease Hemoglobin fairly stable.  Presented with hemoglobin 8.9, this morning 7.9, suspect dilutional with IV fluids per sepsis protocol. No evidence of bleeding. -- Monitor CBC -- Continue home iron supplement  Thoracic aorta atherosclerosis (HCC) On statin  Chronic stable angina (HCC) No active chest pain. -- Continue Imdur and statin  HLD (hyperlipidemia) Continue Crestor  Benign hypertension Home regimen appears to be Lasix and Imdur.  Lasix held on admission due to sepsis, given 500 cc normal saline bolus in the ED. -- Continue Imdur -- Likely resume Lasix tomorrow -- As needed hydralazine for SBP over 160 -- Monitor BP  Controlled gout Stable, no acute flare symptoms. -- Continue allopurinol  History of peptic ulcer disease Stable without acute issues. -- Continue PPI  H/O gastrointestinal hemorrhage Now with Hbg declining.  Monitor. Mgmt as outlined above.  Adult hypothyroidism Continue levothyroxine  Mild cognitive disorder Appears at cognitive baseline. -- Delirium precautions  Osteoporosis -- Fall precautions  Secondary hyperparathyroidism ([place]) Due to CKD.     Subjective:  Patient up in recliner when seen today.  2 daughters at bedside.  No acute events reported.  Patient nonverbal.  Daughters reports she seems to be closer to her baseline.  They report patient always has dark stools due to taking iron supplement.  Her  prior history of GI bleeding was many years ago.  They are agreeable to blood transfusion if hemoglobin drops  below 7.  Also agreeable to GI evaluation should this become necessary.  Objective Vitals:   08/08/21 2051 08/09/21 0522 08/09/21 0731 08/09/21 1125  BP: (!) 143/79 (!) 107/59 (!) 117/59 137/71  Pulse: 80 77 64 69  Resp: '16 16 16 16  ' Temp: 98.6 F (37 C) 98 F (36.7 C) 97.8 F (36.6 C) 98.3 F (36.8 C)  TempSrc: Oral Oral Oral Oral  SpO2: 98% 95% 98% 99%  Weight:      Height:       52.2 kg  Vital signs were reviewed and unremarkable except for: labile blood pressures ranging from soft to mildly elevated   Exam General exam: awake, alert, no acute distress, nonverbal HEENT: Edentulous, moist mucus membranes Respiratory system: CTAB with diminished bases, no wheezes or rhonchi, normal respiratory effort, on room air. Cardiovascular system: normal S1/S2, RRR, no pedal edema.   Central nervous system: Nonverbal unable to assess orientation, does not follow commands due to dementia. no gross focal neurologic deficits Extremities: moves all, no edema, normal tone Skin: Right upper extremity with clean dry intact foam dressing overlying skin tear, dry, intact, normal temperature   Labs / Other Information My review of labs, imaging, notes and other tests is significant for Decline in hemoglobin to 7.0, creatinine improved from 3.4-3.29, anemia panel remarkable only for TIBC 218 (iron folic acid C88 and ferritin are normal)     Disposition Plan: Status is: Inpatient  Remains inpatient appropriate because: Severity of illness with declining hemoglobin with three-point drop in the past month and a half, requires close monitoring, possible blood transfusion and further evaluation      Time spent: 30 minutes  [facility] 08/09/2021, 2:20 PM

## 2021-08-09 NOTE — Assessment & Plan Note (Signed)
Hemoglobin fairly stable.  Presented with hemoglobin 8.9, this morning 7.9, suspect dilutional with IV fluids per sepsis protocol. No evidence of bleeding. -- Monitor CBC -- Continue home iron supplement

## 2021-08-09 NOTE — Assessment & Plan Note (Signed)
Renal function stable and near baseline. Monitor BMP 

## 2021-08-10 LAB — CBC
HCT: 26.4 % — ABNORMAL LOW (ref 36.0–46.0)
Hemoglobin: 8.4 g/dL — ABNORMAL LOW (ref 12.0–15.0)
MCH: 30.9 pg (ref 26.0–34.0)
MCHC: 31.8 g/dL (ref 30.0–36.0)
MCV: 97.1 fL (ref 80.0–100.0)
Platelets: 316 10*3/uL (ref 150–400)
RBC: 2.72 MIL/uL — ABNORMAL LOW (ref 3.87–5.11)
RDW: 13.2 % (ref 11.5–15.5)
WBC: 10.2 10*3/uL (ref 4.0–10.5)
nRBC: 0 % (ref 0.0–0.2)

## 2021-08-10 LAB — BASIC METABOLIC PANEL
Anion gap: 7 (ref 5–15)
BUN: 50 mg/dL — ABNORMAL HIGH (ref 8–23)
CO2: 24 mmol/L (ref 22–32)
Calcium: 8.5 mg/dL — ABNORMAL LOW (ref 8.9–10.3)
Chloride: 109 mmol/L (ref 98–111)
Creatinine, Ser: 3.01 mg/dL — ABNORMAL HIGH (ref 0.44–1.00)
GFR, Estimated: 14 mL/min — ABNORMAL LOW (ref >60)
Glucose, Bld: 86 mg/dL (ref 70–99)
Potassium: 3.9 mmol/L (ref 3.5–5.1)
Sodium: 140 mmol/L (ref 135–145)

## 2021-08-10 MED ORDER — AZITHROMYCIN 500 MG PO TABS: 500.0000 mg | ORAL_TABLET | Freq: Every day | ORAL | Status: DC

## 2021-08-10 MED ORDER — AMOXICILLIN-POT CLAVULANATE 250-62.5 MG/5ML PO SUSR: 500.0000 mg | Freq: Two times a day (BID) | ORAL | 0 refills | Status: AC

## 2021-08-10 MED ORDER — GUAIFENESIN 100 MG/5ML PO LIQD
5.0000 mL | ORAL | 0 refills | Status: DC | PRN
Start: 2021-08-10 — End: 2021-10-12

## 2021-08-10 NOTE — Progress Notes (Signed)
PHARMACIST - PHYSICIAN COMMUNICATION  CONCERNING: Antibiotic IV to Oral Route Change Policy  RECOMMENDATION: This patient is receiving azithromycin by the intravenous route.  Based on criteria approved by the Pharmacy and Therapeutics Committee, the antibiotic(s) is/are being converted to the equivalent oral dose form(s).   DESCRIPTION: These criteria include: Patient being treated for a respiratory tract infection, urinary tract infection, cellulitis or clostridium difficile associated diarrhea if on metronidazole The patient is not neutropenic and does not exhibit a GI malabsorption state The patient is eating (either orally or via tube) and/or has been taking other orally administered medications for a least 24 hours The patient is improving clinically and has a Tmax < 100.5  If you have questions about this conversion, please contact the [place]  08/10/21

## 2021-08-10 NOTE — Discharge Summary (Signed)
++ Physician Discharge Summary   Patient name: Crystal Faulkner  Admit date:     08/07/2021  Discharge date: 08/10/2021  Discharge Physician: Ezekiel Slocumb   PCP: Steele Sizer, MD   Recommendations at discharge:   Follow up with PCP in 1 week. Re-check CBC/BMP in 1 week Follow up on recovery from pneumonia   Discharge Diagnoses Principal Problem:   Community acquired pneumonia Active Problems:   Hemiparesis affecting right side as late effect of cerebrovascular accident (CVA) ([place])   Sepsis ([place])   Acute on chronic anemia   Chronic diastolic CHF (congestive heart failure) (HCC)   CKD (chronic kidney disease) stage 5, GFR less than 15 ml/min (HCC)   Anemia of chronic disease   Benign hypertension   HLD (hyperlipidemia)   Chronic stable angina (HCC)   Thoracic aorta atherosclerosis (HCC)   Controlled gout   H/O gastrointestinal hemorrhage   History of peptic ulcer disease   Adult hypothyroidism   Mild cognitive disorder   Osteoporosis   Secondary hyperparathyroidism [facility])     Hospital Course   87 year old female with past medical history of hypertension, gout, CAD status post stent in 2004, hypothyroidism, GERD and peptic ulcer disease, hyperlipidemia, dementia who presented to the ED on 08/07/2021 with concerns of progressive generalized weakness.  Family also reported patient having a dry cough.  Patient had tested positive for influenza A on 07/23/2021.  Patient met sepsis criteria in the ED with tachypnea and leukocytosis.  Chest x-ray showed a right lower lobe infiltrate consistent with pneumonia.  She was started on empiric IV antibiotics and admitted to hospitalist service for further evaluation management.   * Community acquired pneumonia Present on admission, in the setting of recent influenza A infection, 07/23/2021.  Patient met sepsis criteria on admission. -- Treated with IV Rocephin and Zithromax -- MRSA screen negative -- Antitussives as  needed -- Incentive spirometry and flutter valve -- Sputum culture could not be collected due dementia, pt does not understand and swallows sputum -- Monitor CBC -- Aspiration precautions   Patient is clinically improved, mental status at baseline, afebrile, not requiring oxygen.  Hbg stabilized.   Patient is medically stable for discharge home with PO antibiotics to complete treatment for CAP. PCP follow up.  Hemiparesis affecting right side as late effect of cerebrovascular accident (CVA) ([place]) Stable no acute issues.  Monitor.   Acute on chronic anemia Hbg declined to 7.0.  On admission Hbg was 8.9, recently was 10.1 on 06/18/21. Three point drop in past month & a half. Dark stools at baseline due to oral iron supplement. Anemia panel unremarkable. Pt does have remote hx of GI bleeding.  Hbg was trended and improved after IV hydration was stopped.     Sepsis ([place]) Present on admission with leukocytosis, tachypnea and right lower lobe pneumonia on chest x-ray. Sepsis physiology resolved. -- Management as outlined, see community-acquired pneumonia -- Follow cultures   Chronic diastolic CHF (congestive heart failure) ([place]) Patient appears euvolemic, well compensated at this time. -- Home Lasix held, resume at d/c -- Monitor volume status   CKD (chronic kidney disease) stage 5, GFR less than 15 ml/min (HCC) Renal function stable and near baseline. -- Monitor BMP   Anemia of chronic disease Hemoglobin fairly stable.  Presented with hemoglobin 8.9, this morning 7.9, suspect dilutional with IV fluids per sepsis protocol.  No evidence of bleeding. -- Continue home iron supplement  Hbg improved this AM, stable at 8.4. Repeat CBC within 1  week in follow up   Thoracic aorta atherosclerosis ([place]) On statin   Chronic stable angina (HCC) No active chest pain. -- Continue Imdur and statin   HLD (hyperlipidemia) Continue Crestor   Benign hypertension Home regimen appears to  be Lasix and Imdur.  Lasix held on admission due to sepsis, given 500 cc normal saline bolus in the ED. -- Continue Imdur -- Resume Lasix at d/c, helpd during admission -- Monitor BP.  PCP follow up.   Controlled gout Stable, no acute flare symptoms. -- Continue allopurinol   History of peptic ulcer disease Stable without acute issues. -- Continue PPI   H/O gastrointestinal hemorrhage Hbg declined during admission but improved spontaneously.  Monitor closely, CBC in 1 week. Mgmt as outlined above.   Adult hypothyroidism Continue levothyroxine   Mild cognitive disorder Appears at cognitive baseline, family confirms confusion has resolved. -- Delirium precautions   Osteoporosis -- Fall precautions   Secondary hyperparathyroidism ([place]) Due to CKD.      Procedures performed: None   Condition at discharge: stable  Exam Physical Exam  General exam: awake, alert, no acute distress, frail Respiratory system: CTAB, no wheezes, rales or rhonchi, normal respiratory effort, on room air. Cardiovascular system: normal S1/S2, RRR, no pedal edema.   Central nervous system: no gross focal neurologic deficits, nonverbal at baseline Extremities: moves all, no edema, normal tone Psychiatry: normal mood, congruent affect, abnormal judgement and insight due to baseline dementia   Disposition: Home  Discharge time: greater than 30 minutes.   Allergies as of 08/10/2021   No Known Allergies      Medication List     STOP taking these medications    amLODipine 5 MG tablet Commonly known as: NORVASC   benzonatate 100 MG capsule Commonly known as: Tessalon Perles   methylPREDNISolone 4 MG Tbpk tablet Commonly known as: MEDROL DOSEPAK       TAKE these medications    allopurinol 100 MG tablet Commonly known as: ZYLOPRIM TAKE 1 TABLET BY MOUTH EVERY DAY What changed: how much to take   amoxicillin-clavulanate 250-62.5 MG/5ML suspension Commonly known as:  AUGMENTIN Take 10 mLs (500 mg total) by mouth 2 (two) times daily for 3 days.   calcitRIOL 0.25 MCG capsule Commonly known as: ROCALTROL Take 0.25 mcg by mouth as directed. Takes three times a week  mon, wed, and fri.   clopidogrel 75 MG tablet Commonly known as: PLAVIX TAKE 1 TABLET BY MOUTH EVERY DAY   ferrous sulfate 325 (65 FE) MG tablet Take 325 mg by mouth 2 (two) times daily with a meal.   furosemide 40 MG tablet Commonly known as: Lasix Take 1 tablet (40 mg total) by mouth every other day.   guaiFENesin 100 MG/5ML liquid Commonly known as: ROBITUSSIN Take 5 mLs by mouth every 4 (four) hours as needed for cough or to loosen phlegm.   isosorbide mononitrate 60 MG 24 hr tablet Commonly known as: IMDUR TAKE 1 TABLET BY MOUTH EVERY DAY   levothyroxine 50 MCG tablet Commonly known as: SYNTHROID TAKE 1 TABLET (50 MCG TOTAL) BY MOUTH DAILY AT 6 (SIX) AM.   pantoprazole 40 MG tablet Commonly known as: PROTONIX TAKE 1 TABLET BY MOUTH EVERY DAY   raloxifene 60 MG tablet Commonly known as: EVISTA TAKE 1 TABLET BY MOUTH EVERY DAY   rosuvastatin 10 MG tablet Commonly known as: CRESTOR TAKE 1 TABLET BY MOUTH EVERY DAY   sodium bicarbonate 650 MG tablet Take 650 mg by mouth daily.  Discharge Care Instructions  (From admission, onward)           Start     Ordered   08/10/21 0000  Discharge wound care:       Comments: Apply single layer of xeroform over skin tear  right arm, cover with silicone foam.  Change every three days.   08/10/21 0933            DG Chest 2 View  Result Date: 08/07/2021 CLINICAL DATA:  Cough EXAM: CHEST - 2 VIEW COMPARISON:  07/23/2021 FINDINGS: Large hiatal hernia. There is hyperinflation of the lungs compatible with COPD. Heart is normal size. Small left pleural effusion. Airspace opacity in the right lower lobe concerning for pneumonia. No confluent opacity on the left. Aortic atherosclerosis. IMPRESSION: Right  lower lobe airspace opacity concerning for pneumonia. Small left pleural effusion. Large hiatal hernia. COPD.  Aortic atherosclerosis. Electronically Signed   By: Rolm Baptise M.D.   On: 08/07/2021 18:04   DG Chest 2 View  Result Date: 07/23/2021 CLINICAL DATA:  Cough. EXAM: CHEST - 2 VIEW COMPARISON:  October 15, 2018. FINDINGS: Stable cardiomediastinal silhouette. Large hiatal hernia is noted. Both lungs are clear. The visualized skeletal structures are unremarkable. IMPRESSION: No acute cardiopulmonary disease.  Large hiatal hernia. Aortic Atherosclerosis (ICD10-I70.0). Electronically Signed   By: Marijo Conception M.D.   On: 07/23/2021 16:57   CT HEAD WO CONTRAST (5MM)  Result Date: 08/07/2021 CLINICAL DATA:  Weakness EXAM: CT HEAD WITHOUT CONTRAST TECHNIQUE: Contiguous axial images were obtained from the base of the skull through the vertex without intravenous contrast. COMPARISON:  06/18/2021 FINDINGS: Brain: Old left MCA infarct with encephalomalacia in the left parietal and frontal lobes, unchanged. There is atrophy and chronic small vessel disease changes. No acute intracranial abnormality. Specifically, no hemorrhage, hydrocephalus, mass lesion, acute infarction, or significant intracranial injury. Vascular: No hyperdense vessel or unexpected calcification. Skull: No acute calvarial abnormality. Sinuses/Orbits: Air-fluid level and mucosal thickening in the left sphenoid sinus. Remainder the paranasal sinuses clear. Other: None IMPRESSION: Old left MCA infarct. Atrophy, chronic microvascular disease. No acute intracranial abnormality. Acute on chronic left sphenoid sinusitis. Electronically Signed   By: Rolm Baptise M.D.   On: 08/07/2021 18:29   Results for orders placed or performed during the hospital encounter of 08/07/21  Resp Panel by RT-PCR (Flu A&B, Covid) Nasopharyngeal Swab     Status: None   Collection Time: 08/07/21  8:34 PM   Specimen: Nasopharyngeal Swab; Nasopharyngeal(NP) swabs  in vial transport medium  Result Value Ref Range Status   SARS Coronavirus 2 by RT PCR NEGATIVE NEGATIVE Final    Comment: (NOTE) SARS-CoV-2 target nucleic acids are NOT DETECTED.  The SARS-CoV-2 RNA is generally detectable in upper respiratory specimens during the acute phase of infection. The lowest concentration of SARS-CoV-2 viral copies this assay can detect is 138 copies/mL. A negative result does not preclude SARS-Cov-2 infection and should not be used as the sole basis for treatment or other patient management decisions. A negative result may occur with  improper specimen collection/handling, submission of specimen other than nasopharyngeal swab, presence of viral mutation(s) within the areas targeted by this assay, and inadequate number of viral copies(<138 copies/mL). A negative result must be combined with clinical observations, patient history, and epidemiological information. The expected result is Negative.  Fact Sheet for Patients:  EntrepreneurPulse.com.au  Fact Sheet for Healthcare Providers:  IncredibleEmployment.be  This test is no t yet approved or cleared by the [place] FDA  and  has been authorized for detection and/or diagnosis of SARS-CoV-2 by FDA under an Emergency Use Authorization (EUA). This EUA will remain  in effect (meaning this test can be used) for the duration of the COVID-19 declaration under Section 564(b)(1) of the Act, 21 U.S.C.section 360bbb-3(b)(1), unless the authorization is terminated  or revoked sooner.       Influenza A by PCR NEGATIVE NEGATIVE Final   Influenza B by PCR NEGATIVE NEGATIVE Final    Comment: (NOTE) The Xpert Xpress SARS-CoV-2/FLU/RSV plus assay is intended as an aid in the diagnosis of influenza from Nasopharyngeal swab specimens and should not be used as a sole basis for treatment. Nasal washings and aspirates are unacceptable for Xpert Xpress  SARS-CoV-2/FLU/RSV testing.  Fact Sheet for Patients: EntrepreneurPulse.com.au  Fact Sheet for Healthcare Providers: IncredibleEmployment.be  This test is not yet approved or cleared by the [place] FDA and has been authorized for detection and/or diagnosis of SARS-CoV-2 by FDA under an Emergency Use Authorization (EUA). This EUA will remain in effect (meaning this test can be used) for the duration of the COVID-19 declaration under Section 564(b)(1) of the Act, 21 U.S.C. section 360bbb-3(b)(1), unless the authorization is terminated or revoked.  Performed at [facility], [address] [place], [place] [address]   Blood Culture (routine x 2)     Status: None (Preliminary result)   Collection Time: 08/07/21  8:35 PM   Specimen: BLOOD  Result Value Ref Range Status   Specimen Description BLOOD LAC  Final   Special Requests BOTTLES DRAWN AEROBIC AND ANAEROBIC BCAV  Final   Culture   Final    NO GROWTH 3 DAYS Performed at [facility], [address] [place], [place] [address]    Report Status PENDING  Incomplete  Culture, blood (Routine X 2) w Reflex to ID Panel     Status: None (Preliminary result)   Collection Time: 08/08/21  6:27 AM   Specimen: BLOOD LEFT HAND  Result Value Ref Range Status   Specimen Description BLOOD LEFT HAND  Final   Special Requests   Final    BOTTLES DRAWN AEROBIC AND ANAEROBIC Blood Culture adequate volume   Culture   Final    NO GROWTH 2 DAYS Performed at [facility], [address], [place], [place] [address]    Report Status PENDING  Incomplete  MRSA Next Gen by PCR, Nasal     Status: None   Collection Time: 08/08/21  6:47 AM   Specimen: Nasal Mucosa; Nasal Swab  Result Value Ref Range Status   MRSA by PCR Next Gen NOT DETECTED NOT DETECTED Final    Comment: (NOTE) The GeneXpert MRSA Assay (FDA approved for NASAL specimens only), is one component of a comprehensive  MRSA colonization surveillance program. It is not intended to diagnose MRSA infection nor to guide or monitor treatment for MRSA infections. Test performance is not FDA approved in patients less than 56 years old. Performed at [facility], [address] [place], [place] [address]     Signed:  Ezekiel Slocumb MD.  [facility] 08/10/2021, 9:35 AM

## 2021-08-12 LAB — CULTURE, BLOOD (ROUTINE X 2): Culture: NO GROWTH

## 2021-08-13 ENCOUNTER — Telehealth: Payer: Self-pay

## 2021-08-13 LAB — CULTURE, BLOOD (ROUTINE X 2)
Culture: NO GROWTH
Special Requests: ADEQUATE

## 2021-08-13 NOTE — Telephone Encounter (Signed)
Transition Care Management Unsuccessful Follow-up Telephone Call  Date of discharge and from where:  08/10/21 [facility]  Attempts:  1st Attempt  Reason for unsuccessful TCM follow-up call:  Left voice message

## 2021-08-17 ENCOUNTER — Other Ambulatory Visit: Payer: Self-pay | Admitting: Family Medicine

## 2021-08-17 DIAGNOSIS — M810 Age-related osteoporosis without current pathological fracture: Secondary | ICD-10-CM

## 2021-08-17 NOTE — Telephone Encounter (Signed)
Requested Prescriptions  Pending Prescriptions Disp Refills   raloxifene (EVISTA) 60 MG tablet [Pharmacy Med Name: RALOXIFENE HCL 60 MG TABLET] 90 tablet 0    Sig: TAKE 1 TABLET BY MOUTH EVERY DAY     OB/GYN:  Selective Estrogen Receptor Modulators Passed - 08/17/2021 12:40 AM      Passed - Valid encounter within last 12 months    Recent Outpatient Visits          2 weeks ago URI due to influenza   [place], DO   2 months ago Need for influenza vaccination   [facility] Rory Percy M, DO   8 months ago CKD (chronic kidney disease) stage 5, GFR less than 15 ml/min [facility])   [facility] [place], Drue Stager, MD   1 year ago CKD (chronic kidney disease) stage 5, GFR less than 15 ml/min [facility])   [facility] Steele Sizer, MD   1 year ago Impacted cerumen of right ear   [facility] Delsa Grana, PA-C      Future Appointments            In 3 months Steele Sizer, MD [facility], [facility]

## 2021-08-22 NOTE — Telephone Encounter (Signed)
Transition Care Management Follow-up Telephone Call Date of discharge and from where: 08/10/21 How have you been since you were released from the hospital? Pt doing well Any questions or concerns? No  Items Reviewed: Did the pt receive and understand the discharge instructions provided? Yes  Medications obtained and verified? Yes  Other? No  Any new allergies since your discharge? No  Dietary orders reviewed? Yes Do you have support at home? Yes   Home Care and Equipment/Supplies: Were home health services ordered? no  Were any new equipment or medical supplies ordered?  No   Functional Questionnaire: (I = Independent and D = Dependent) ADLs: D  Bathing/Dressing- D  Meal Prep- D  Eating- I  Maintaining continence- D  Transferring/Ambulation- I with assistance  Managing Meds- D  Follow up appointments reviewed:  PCP Hospital f/u appt confirmed? No . [facility] Crystal Faulkner declined follow up. [facility] f/u appt confirmed? No   Are transportation arrangements needed? No  If their condition worsens, is the pt aware to call PCP or go to the Emergency Dept.? Yes Was the patient provided with contact information for the PCP's office or ED? Yes Was to pt encouraged to call back with questions or concerns? Yes

## 2021-09-23 ENCOUNTER — Encounter: Payer: Self-pay | Admitting: Nurse Practitioner

## 2021-09-23 ENCOUNTER — Ambulatory Visit: Payer: Self-pay | Admitting: *Deleted

## 2021-09-23 ENCOUNTER — Telehealth (INDEPENDENT_AMBULATORY_CARE_PROVIDER_SITE_OTHER): Payer: Medicare HMO | Admitting: Nurse Practitioner

## 2021-09-23 ENCOUNTER — Emergency Department
Admission: EM | Admit: 2021-09-23 | Discharge: 2021-09-23 | Disposition: A | Discharge status: Home / Self Care | Payer: Medicare HMO | Attending: Emergency Medicine | Admitting: Emergency Medicine

## 2021-09-23 ENCOUNTER — Other Ambulatory Visit: Payer: Self-pay

## 2021-09-23 ENCOUNTER — Emergency Department: Payer: Medicare HMO

## 2021-09-23 DIAGNOSIS — N189 Chronic kidney disease, unspecified: Secondary | ICD-10-CM | POA: Diagnosis not present

## 2021-09-23 DIAGNOSIS — U071 COVID-19: Secondary | ICD-10-CM | POA: Diagnosis not present

## 2021-09-23 DIAGNOSIS — K449 Diaphragmatic hernia without obstruction or gangrene: Secondary | ICD-10-CM | POA: Diagnosis not present

## 2021-09-23 LAB — CBC WITH DIFFERENTIAL/PLATELET
Abs Immature Granulocytes: 0.05 10*3/uL (ref 0.00–0.07)
Basophils Absolute: 0 10*3/uL (ref 0.0–0.1)
Basophils Relative: 0 %
Eosinophils Absolute: 0 10*3/uL (ref 0.0–0.5)
Eosinophils Relative: 0 %
HCT: 29.6 % — ABNORMAL LOW (ref 36.0–46.0)
Hemoglobin: 8.9 g/dL — ABNORMAL LOW (ref 12.0–15.0)
Immature Granulocytes: 1 %
Lymphocytes Relative: 18 %
Lymphs Abs: 1.5 10*3/uL (ref 0.7–4.0)
MCH: 31.2 pg (ref 26.0–34.0)
MCHC: 30.1 g/dL (ref 30.0–36.0)
MCV: 103.9 fL — ABNORMAL HIGH (ref 80.0–100.0)
Monocytes Absolute: 0.7 10*3/uL (ref 0.1–1.0)
Monocytes Relative: 8 %
Neutro Abs: 5.7 10*3/uL (ref 1.7–7.7)
Neutrophils Relative %: 73 %
Platelets: 164 10*3/uL (ref 150–400)
RBC: 2.85 MIL/uL — ABNORMAL LOW (ref 3.87–5.11)
RDW: 15.2 % (ref 11.5–15.5)
WBC: 7.9 10*3/uL (ref 4.0–10.5)
nRBC: 0 % (ref 0.0–0.2)

## 2021-09-23 LAB — COMPREHENSIVE METABOLIC PANEL
ALT: 25 U/L (ref 0–44)
AST: 52 U/L — ABNORMAL HIGH (ref 15–41)
Albumin: 2.7 g/dL — ABNORMAL LOW (ref 3.5–5.0)
Alkaline Phosphatase: 52 U/L (ref 38–126)
Anion gap: 10 (ref 5–15)
BUN: 59 mg/dL — ABNORMAL HIGH (ref 8–23)
CO2: 23 mmol/L (ref 22–32)
Calcium: 8.6 mg/dL — ABNORMAL LOW (ref 8.9–10.3)
Chloride: 108 mmol/L (ref 98–111)
Creatinine, Ser: 4.03 mg/dL — ABNORMAL HIGH (ref 0.44–1.00)
GFR, Estimated: 10 mL/min — ABNORMAL LOW (ref >60)
Glucose, Bld: 92 mg/dL (ref 70–99)
Potassium: 4.1 mmol/L (ref 3.5–5.1)
Sodium: 141 mmol/L (ref 135–145)
Total Bilirubin: 0.3 mg/dL (ref 0.3–1.2)
Total Protein: 6.1 g/dL — ABNORMAL LOW (ref 6.5–8.1)

## 2021-09-23 NOTE — Telephone Encounter (Signed)
-----   Message from Erick Blinks sent at 09/23/2021  2:46 PM EST -----  Pt's daughter called reporting that she just started symptoms, she has cold symptoms. Last time she was sent to ED for pneumonia when this happened. Pt's daughter called requesting medication to stop this for the patient now. Whatever the hospital prescribed for her in the hospital.   Chief Complaint: cold symptoms, questionable COVID as [place] member is positive Symptoms: runny nose, cough Frequency: not very bad Pertinent Negatives: Patient denies fever Disposition: [] ED /[] Urgent Care (no appt availability in office) / [x] Appointment(In office/virtual)/ []  [place] [facility]/ [] Home Care/ [] Refused Recommended Disposition /[] [facility]/ []  Follow-up with PCP Additional Notes: Pt testing for COVID while awaiting virtual visit.  Reason for Disposition  Common cold with no complications  Answer Assessment - Initial Assessment Questions 1. ONSET: "When did the nasal discharge start?"      Yesterday (while on phone she learned a [place] member has [place]. 2. AMOUNT: "How much discharge is there?"      Runny nose, yellow 3. COUGH: "Do you have a cough?" If yes, ask: "Describe the color of your sputum" (clear, white, yellow, green)     Cough but not bring anything up 4. RESPIRATORY DISTRESS: "Describe your breathing."      no 5. FEVER: "Do you have a fever?" If Yes, ask: "What is your temperature, how was it measured, and when did it start?"     no 6. SEVERITY: "Overall, how bad are you feeling right now?" (e.g., doesn't interfere with normal activities, staying home from school/work, staying in bed)      Trying to catch it early 7. OTHER SYMPTOMS: "Do you have any other symptoms?" (e.g., sore throat, earache, wheezing, vomiting)     no 8. PREGNANCY: "Is there any chance you are pregnant?" "When was your last menstrual period?"     no  Protocols used: Common Cold-A-AH

## 2021-09-23 NOTE — Progress Notes (Signed)
Name: Crystal Faulkner   MRN: [id number]    DOB: 1924-09-08   Date:09/23/2021       Progress Note  Subjective  Chief Complaint  Chief Complaint  Patient presents with   URI    Coughing, runny nose     I connected with  [name]  on 09/23/21 at  4:00 PM EST by a video enabled telemedicine application and verified that I am speaking with the correct person using two identifiers.  I discussed the limitations of evaluation and management by telemedicine and the availability of in person appointments. The patient expressed understanding and agreed to proceed with a virtual visit  Staff also discussed with the patient that there may be a patient responsible charge related to this service. Patient Location: home Provider Location: [facility] Additional Individuals present: daughter  HPI  Covid-19:: Symptoms started yesterday.  She has a runny nose, nasal congestion, fever and cough.  Was seen at [facility] on 08/07/2021 and admitted for pneumonia.  She was also recently exposed to Covid by a family member. She did a home covid test which was positive.  She is unable to swallow pills. She is very weak and fell this morning.  Family caught her and said that she was not injured. She appears very lethargic on video.  Recommended she go to the emergency room.  Family agrees.  Patient Active Problem List   Diagnosis Date Noted   Acute on chronic anemia 08/09/2021   Community acquired pneumonia 08/07/2021   Sepsis ([place]) 10/15/2018   Osteoarthritis 11/19/2017   Dysarthria due to recent cerebrovascular accident (CVA) 06/23/2016   Hemiparesis affecting right side as late effect of cerebrovascular accident (CVA) ([place]) 06/23/2016   CKD (chronic kidney disease) stage 5, GFR less than 15 ml/min ([place]) 06/23/2016   Senile purpura ([place]) 05/13/2016   Hiatal hernia 05/04/2016   Thoracic aorta atherosclerosis ([place]) 05/04/2016   Chronic stable angina ([place]) 10/16/2015   Secondary hyperparathyroidism ([place])  04/13/2015   Arteriosclerosis of coronary artery 04/08/2015   Benign hypertension 04/08/2015   Controlled gout 04/08/2015   History of peptic ulcer disease 04/08/2015   Adult hypothyroidism 04/08/2015   Mild cognitive disorder 04/08/2015   Anemia of chronic disease 04/08/2015   Osteoarthrosis involving more than one site but not generalized 04/08/2015   Osteoporosis 04/08/2015   Pelvic muscle wasting 04/08/2015   Allergic rhinitis 04/08/2015   Chronic diastolic CHF (congestive heart failure) ([place]) 03/29/2014   HLD (hyperlipidemia) 03/29/2014   H/O gastrointestinal hemorrhage 04/05/2007    Social History   Tobacco Use   Smoking status: Never   Smokeless tobacco: Former    Types: Snuff   Tobacco comments:    smoking cessation materials not requuired  Substance Use Topics   Alcohol use: No     Current Outpatient Medications:    allopurinol (ZYLOPRIM) 100 MG tablet, TAKE 1 TABLET BY MOUTH EVERY DAY (Patient taking differently: Take 50 mg by mouth daily.), Disp: 90 tablet, Rfl: 1   calcitRIOL (ROCALTROL) 0.25 MCG capsule, Take 0.25 mcg by mouth as directed. Takes three times a week  mon, wed, and fri., Disp: , Rfl:    clopidogrel (PLAVIX) 75 MG tablet, TAKE 1 TABLET BY MOUTH EVERY DAY, Disp: 90 tablet, Rfl: 1   ferrous sulfate 325 (65 FE) MG tablet, Take 325 mg by mouth 2 (two) times daily with a meal., Disp: , Rfl:    guaiFENesin (ROBITUSSIN) 100 MG/5ML liquid, Take 5 mLs by mouth every 4 (four) hours  as needed for cough or to loosen phlegm., Disp: 120 mL, Rfl: 0   isosorbide mononitrate (IMDUR) 60 MG 24 hr tablet, TAKE 1 TABLET BY MOUTH EVERY DAY, Disp: 90 tablet, Rfl: 1   levothyroxine (SYNTHROID) 50 MCG tablet, TAKE 1 TABLET (50 MCG TOTAL) BY MOUTH DAILY AT 6 (SIX) AM., Disp: 90 tablet, Rfl: 1   pantoprazole (PROTONIX) 40 MG tablet, TAKE 1 TABLET BY MOUTH EVERY DAY, Disp: 90 tablet, Rfl: 3   raloxifene (EVISTA) 60 MG tablet, TAKE 1 TABLET BY MOUTH EVERY DAY, Disp: 90 tablet,  Rfl: 0   rosuvastatin (CRESTOR) 10 MG tablet, TAKE 1 TABLET BY MOUTH EVERY DAY, Disp: 90 tablet, Rfl: 1   sodium bicarbonate 650 MG tablet, Take 650 mg by mouth daily. , Disp: , Rfl: 11   furosemide (LASIX) 40 MG tablet, Take 1 tablet (40 mg total) by mouth every other day., Disp: 15 tablet, Rfl: 11  No Known Allergies  I personally reviewed active problem list, medication list, allergies, notes from last encounter with the patient/caregiver today.  ROS  Constitutional: Positive for fever, negative for weight change.  Respiratory: Positive for cough, negative for shortness of breath.   Cardiovascular: Negative for chest pain or palpitations.  Gastrointestinal: Negative for abdominal pain, no bowel changes.  Musculoskeletal: Positive for gait problem, negative for  joint swelling.  Skin: Negative for rash.  Neurological: Negative for dizziness or headache.  No other specific complaints in a complete review of systems (except as listed in HPI above).   Objective  Virtual encounter, vitals not obtained.  There is no height or weight on file to calculate BMI.  Nursing Note and Vital Signs reviewed.  Physical Exam  Awake, lethargic  No results found for this or any previous visit (from the past 72 hour(s)).  Assessment & Plan  1. COVID-19 -sent to emergency room, lethargic on video  -Red flags and when to present for emergency care or RTC including fever >101.75F, chest pain, shortness of breath, new/worsening/un-resolving symptoms,  reviewed with patient at time of visit. Follow up and care instructions discussed and provided in AVS. - I discussed the assessment and treatment plan with the patient. The patient was provided an opportunity to ask questions and all were answered. The patient agreed with the plan and demonstrated an understanding of the instructions.  I provided 15 minutes of non-face-to-face time during this encounter.  Bo Merino, FNP

## 2021-09-23 NOTE — ED Triage Notes (Signed)
Patient to ER via POV with caregiver (daughter). Reports testing positive for covid today. Reports runny nose/ body aches and generally not feeling well. Denies fevers at home. Previously seen for pneumonia.  Unable to take medications today due to feeling badly.

## 2021-09-23 NOTE — ED Provider Notes (Signed)
[facility] Provider Note    Event Date/Time   First MD Initiated Contact with Patient 09/23/21 2307     (approximate)   History   COVID+   HPI  Level 5 caveat:  Patient is non-verbal at baseline; daughter is at bedside and provides the history.   Crystal Faulkner is a 88 y.o. female whose medical history notably includes chronic kidney disease and a prior CVA which has resulted in her being nonverbal but still cannot understand what is being said to her.  Her daughter is at bedside and provides history as well as care at home.  They present for evaluation of positive COVID-19 test at home.  The patient has not been feeling as well as usual for the last few days and has had a runny nose and a cough.  She was seen a month or 2 ago and diagnosed with pneumonia but has recovered from that.  She is not having any trouble breathing, no nausea nor vomiting, no abdominal pain.  However when her test was positive, the daughter reports that they contacted the patient's primary care doctor who encouraged them to come to the emergency department.  Her daughter said that she is still taking her medicines and still eating and drinking but her primary care doctor wanted her to be checked out.  The patient is in no distress at this time, awake and alert and at baseline.      Physical Exam   Triage Vital Signs: ED Triage Vitals  Enc Vitals Group     BP 09/23/21 1827 (!) 148/77     Pulse Rate 09/23/21 1827 84     Resp 09/23/21 1827 20     Temp 09/23/21 1827 99.3 F (37.4 C)     Temp Source 09/23/21 1827 Oral     SpO2 09/23/21 1827 98 %     Weight --      Height 09/23/21 1828 1.524 m (5')     Head Circumference --      Peak Flow --      Pain Score --      Pain Loc --      Pain Edu? --      Excl. in [place]? --     Most recent vital signs: Vitals:   09/23/21 2237 09/23/21 2330  BP: (!) 143/70 (!) 128/59  Pulse: 78 71  Resp: 20 20  Temp:    SpO2: 97% 98%      General: Awake, no distress.  CV:  Good peripheral perfusion.  Normal heart sounds and capillary refill. Resp:  Normal effort.  No retractions or accessory muscle usage.  Occasional mild but thick sounding cough.  Lungs are clear to auscultation bilaterally with no wheezing and no obvious coarse breath sounds. Abd:  No distention.  Other:  Nonverbal but responding to what I say, patting my hand and squeezing my hand encouragingly.  Normal skin turgor.  No clinical evidence of dehydration.   ED Results / Procedures / Treatments   Labs (all labs ordered are listed, but only abnormal results are displayed) Labs Reviewed  CBC WITH DIFFERENTIAL/PLATELET - Abnormal; Notable for the following components:      Result Value   RBC 2.85 (*)    Hemoglobin 8.9 (*)    HCT 29.6 (*)    MCV 103.9 (*)    All other components within normal limits  COMPREHENSIVE METABOLIC PANEL - Abnormal; Notable for the following components:   BUN 59 (*)  Creatinine, Ser 4.03 (*)    Calcium 8.6 (*)    Total Protein 6.1 (*)    Albumin 2.7 (*)    AST 52 (*)    GFR, Estimated 10 (*)    All other components within normal limits     RADIOLOGY I personally reviewed the patient's chest x-ray and I see no sign of any infiltrate/pneumonia.  I reviewed the radiologist report which also does not note any acute abnormality.    PROCEDURES:  Critical Care performed: No  Procedures   MEDICATIONS ORDERED IN ED: Medications - No data to display   IMPRESSION / MDM / [place] / ED COURSE  I reviewed the triage vital signs and the nursing notes.                              Differential diagnosis includes, but is not limited to, COVID-19, pneumonia, respiratory failure, dehydration, electrolyte or metabolic abnormality.  Patient's vital signs are reassuring and within normal limits.  She has no hypoxemia, no tachycardia, and is afebrile.  No tachypnea, no accessory muscle usage, no respiratory  distress.  As described above, her chest x-ray is clear with no sign of pneumonia.  Labs ordered include CMP and CBC with differential.  I reviewed the results and her CBC is stable for her; she has no leukocytosis and chronic anemia which is unchanged.  I reviewed her CMP.  The results are notable for creatinine of 4.  However this is in the setting of chronic kidney disease with a creatinine that seems to range between 3 and 4.  Even though this is an increase from baseline, given her degree of chronic kidney disease, it is a very minimal change in her GFR.  Initially when I went into the room I was anticipating she may benefit from IV hydration, and I even considered admission, but after seeing how well-appearing she looks and receiving reassurance from the patient's daughter that she is still taking oral hydration without any objection, I think that the risk for this 88 year old are coming into the hospital and developing some altered mental status/delirium is worse than the alternative.   I had my usual conversation about elderly COVID patients with the daughter and explained that she is at high risk of worsening (morbidity and mortality) regardless of what is done, admission versus going home, but at this point there is no indication she needs to stay in the hospital.  The patient's daughter says that she understands and would rather take her home.  I repeatedly encouraged aggressive oral rehydration and such as Gatorade or mentioned also the use of nutritional supplements such as boost or Ensure.  The daughter assured me that she would do so.  She brought up the possibility of cough medicine but I explained that in general I do not recommend them for elderly patients, particularly strong, prescription strength cough medicine.  She said that they would try Tylenol for comfort at home.  I encouraged close outpatient follow-up with her primary care doctor and I gave my usual and customary return  precautions.   FINAL CLINICAL IMPRESSION(S) / ED DIAGNOSES   Final diagnoses:  COVID-19  Chronic kidney disease, unspecified CKD stage     Rx / DC Orders   ED Discharge Orders     None        Note:  This document was prepared using Dragon voice recognition software and may include unintentional  dictation errors.   Hinda Kehr, MD 09/24/21 0010

## 2021-09-23 NOTE — ED Provider Triage Note (Signed)
Emergency Medicine Provider Triage Evaluation Note  Crystal Faulkner , a 88 y.o. female  was evaluated in triage.  Pt complains of cough. Runny nose, body aches. Recently treated for pneumonia. Tested positive today for COVID. Sent to the ER by EMS.  Review of Systems  Positive: Rhinorrhea, cough, bodyaches Negative: Vomiting diarrhea  Physical Exam  BP (!) 148/77    Pulse 84    Temp 99.3 F (37.4 C) (Oral)    Resp 20    Ht 5' (1.524 m)    SpO2 98%    BMI 22.48 kg/m  Gen:   Awake, no distress   Resp:  Normal effort  MSK:   Moves extremities without difficulty  Other:    Medical Decision Making  Medically screening exam initiated at 6:28 PM.  Appropriate orders placed.  Crystal Faulkner was informed that the remainder of the evaluation will be completed by another provider, this initial triage assessment does not replace that evaluation, and the importance of remaining in the ED until their evaluation is complete.   Victorino Dike, FNP 09/23/21 1829

## 2021-09-23 NOTE — Discharge Instructions (Addendum)
As we discussed, although you have tested positive for COVID-19 (coronavirus), you do not need to be hospitalized at this time.  Read through all the included information including the recommendations from the CDC.  We recommend that you self-quarantine at home with your immediate family only (people with whom you have already been in contact) for about a week after your fever has gone away (without taking medication to make your temperature come down, such as Tylenol (acetaminophen)), after your respiratory symptoms have improved.  You should have as minimal contact as possible with anyone else including close family as per the [facility] paperwork guidelines listed below. Follow-up with your doctor by phone or online as needed and return immediately to the emergency department or call 911 only if you develop new or worsening symptoms that concern you.  If you were prescribed any medications, please use them as instructed.  The main issue you may have is with dehydration since your kidneys do not work well normally.  Your creatinine is a little bit higher than usual, so it is very important that you drink plenty of fluids (such as Gatorade, dietary supplements such as Ensure or Boost, etc.).  Please follow-up with your regular doctor to decide whether you should have repeat labs performed as an outpatient to see how your creatinine is doing.  You can find up-to-date information about COVID-19 in [place] by calling the [place]: [phone]. You may also call 2-1-1, or [phone], or additional resources.  You can also find information online at [email or web address]-[email or web address] or on the Center for Disease Control (CDC) website at BeginnerSteps.be.

## 2021-09-27 ENCOUNTER — Emergency Department: Payer: Medicare HMO

## 2021-09-27 ENCOUNTER — Encounter: Payer: Self-pay | Admitting: Emergency Medicine

## 2021-09-27 ENCOUNTER — Other Ambulatory Visit: Payer: Self-pay

## 2021-09-27 ENCOUNTER — Inpatient Hospital Stay
Admission: EM | Admit: 2021-09-27 | Discharge: 2021-10-12 | DRG: 177 | Disposition: A | Discharge status: Hospice | Payer: Medicare HMO | Attending: Internal Medicine | Admitting: Internal Medicine

## 2021-09-27 DIAGNOSIS — J9601 Acute respiratory failure with hypoxia: Secondary | ICD-10-CM | POA: Diagnosis present

## 2021-09-27 DIAGNOSIS — S99921A Unspecified injury of right foot, initial encounter: Secondary | ICD-10-CM | POA: Diagnosis not present

## 2021-09-27 DIAGNOSIS — E039 Hypothyroidism, unspecified: Secondary | ICD-10-CM | POA: Diagnosis present

## 2021-09-27 DIAGNOSIS — F0280 Dementia in other diseases classified elsewhere without behavioral disturbance: Secondary | ICD-10-CM | POA: Diagnosis present

## 2021-09-27 DIAGNOSIS — N179 Acute kidney failure, unspecified: Secondary | ICD-10-CM | POA: Diagnosis present

## 2021-09-27 DIAGNOSIS — Z7189 Other specified counseling: Secondary | ICD-10-CM | POA: Diagnosis not present

## 2021-09-27 DIAGNOSIS — R404 Transient alteration of awareness: Secondary | ICD-10-CM | POA: Diagnosis not present

## 2021-09-27 DIAGNOSIS — N185 Chronic kidney disease, stage 5: Secondary | ICD-10-CM | POA: Diagnosis not present

## 2021-09-27 DIAGNOSIS — E872 Acidosis, unspecified: Secondary | ICD-10-CM | POA: Diagnosis present

## 2021-09-27 DIAGNOSIS — R0902 Hypoxemia: Secondary | ICD-10-CM | POA: Diagnosis not present

## 2021-09-27 DIAGNOSIS — W19XXXA Unspecified fall, initial encounter: Secondary | ICD-10-CM | POA: Diagnosis present

## 2021-09-27 DIAGNOSIS — Z043 Encounter for examination and observation following other accident: Secondary | ICD-10-CM | POA: Diagnosis not present

## 2021-09-27 DIAGNOSIS — R7881 Bacteremia: Secondary | ICD-10-CM | POA: Diagnosis not present

## 2021-09-27 DIAGNOSIS — S52611A Displaced fracture of right ulna styloid process, initial encounter for closed fracture: Secondary | ICD-10-CM | POA: Diagnosis not present

## 2021-09-27 DIAGNOSIS — Z66 Do not resuscitate: Secondary | ICD-10-CM | POA: Diagnosis present

## 2021-09-27 DIAGNOSIS — I5032 Chronic diastolic (congestive) heart failure: Secondary | ICD-10-CM | POA: Diagnosis present

## 2021-09-27 DIAGNOSIS — S8991XA Unspecified injury of right lower leg, initial encounter: Secondary | ICD-10-CM | POA: Diagnosis not present

## 2021-09-27 DIAGNOSIS — M109 Gout, unspecified: Secondary | ICD-10-CM | POA: Diagnosis present

## 2021-09-27 DIAGNOSIS — R4182 Altered mental status, unspecified: Secondary | ICD-10-CM | POA: Diagnosis not present

## 2021-09-27 DIAGNOSIS — R627 Adult failure to thrive: Secondary | ICD-10-CM | POA: Diagnosis not present

## 2021-09-27 DIAGNOSIS — N189 Chronic kidney disease, unspecified: Secondary | ICD-10-CM

## 2021-09-27 DIAGNOSIS — S4992XA Unspecified injury of left shoulder and upper arm, initial encounter: Secondary | ICD-10-CM | POA: Diagnosis not present

## 2021-09-27 DIAGNOSIS — Z720 Tobacco use: Secondary | ICD-10-CM

## 2021-09-27 DIAGNOSIS — G309 Alzheimer's disease, unspecified: Secondary | ICD-10-CM | POA: Diagnosis present

## 2021-09-27 DIAGNOSIS — J1282 Pneumonia due to coronavirus disease 2019: Secondary | ICD-10-CM | POA: Diagnosis not present

## 2021-09-27 DIAGNOSIS — I69320 Aphasia following cerebral infarction: Secondary | ICD-10-CM

## 2021-09-27 DIAGNOSIS — Z79890 Hormone replacement therapy (postmenopausal): Secondary | ICD-10-CM

## 2021-09-27 DIAGNOSIS — I69351 Hemiplegia and hemiparesis following cerebral infarction affecting right dominant side: Secondary | ICD-10-CM | POA: Diagnosis not present

## 2021-09-27 DIAGNOSIS — M79621 Pain in right upper arm: Secondary | ICD-10-CM | POA: Diagnosis not present

## 2021-09-27 DIAGNOSIS — R195 Other fecal abnormalities: Secondary | ICD-10-CM | POA: Diagnosis not present

## 2021-09-27 DIAGNOSIS — R630 Anorexia: Secondary | ICD-10-CM | POA: Diagnosis present

## 2021-09-27 DIAGNOSIS — G9341 Metabolic encephalopathy: Secondary | ICD-10-CM | POA: Diagnosis not present

## 2021-09-27 DIAGNOSIS — I252 Old myocardial infarction: Secondary | ICD-10-CM

## 2021-09-27 DIAGNOSIS — B958 Unspecified staphylococcus as the cause of diseases classified elsewhere: Secondary | ICD-10-CM | POA: Diagnosis not present

## 2021-09-27 DIAGNOSIS — E785 Hyperlipidemia, unspecified: Secondary | ICD-10-CM | POA: Diagnosis present

## 2021-09-27 DIAGNOSIS — I10 Essential (primary) hypertension: Secondary | ICD-10-CM | POA: Diagnosis not present

## 2021-09-27 DIAGNOSIS — M1611 Unilateral primary osteoarthritis, right hip: Secondary | ICD-10-CM | POA: Diagnosis not present

## 2021-09-27 DIAGNOSIS — J189 Pneumonia, unspecified organism: Secondary | ICD-10-CM

## 2021-09-27 DIAGNOSIS — R296 Repeated falls: Secondary | ICD-10-CM | POA: Diagnosis present

## 2021-09-27 DIAGNOSIS — I132 Hypertensive heart and chronic kidney disease with heart failure and with stage 5 chronic kidney disease, or end stage renal disease: Secondary | ICD-10-CM | POA: Diagnosis not present

## 2021-09-27 DIAGNOSIS — D539 Nutritional anemia, unspecified: Secondary | ICD-10-CM | POA: Diagnosis present

## 2021-09-27 DIAGNOSIS — M19031 Primary osteoarthritis, right wrist: Secondary | ICD-10-CM | POA: Diagnosis not present

## 2021-09-27 DIAGNOSIS — M79641 Pain in right hand: Secondary | ICD-10-CM | POA: Diagnosis not present

## 2021-09-27 DIAGNOSIS — Z79899 Other long term (current) drug therapy: Secondary | ICD-10-CM

## 2021-09-27 DIAGNOSIS — M810 Age-related osteoporosis without current pathological fracture: Secondary | ICD-10-CM | POA: Diagnosis present

## 2021-09-27 DIAGNOSIS — Z7902 Long term (current) use of antithrombotics/antiplatelets: Secondary | ICD-10-CM

## 2021-09-27 DIAGNOSIS — I2510 Atherosclerotic heart disease of native coronary artery without angina pectoris: Secondary | ICD-10-CM | POA: Diagnosis present

## 2021-09-27 DIAGNOSIS — K449 Diaphragmatic hernia without obstruction or gangrene: Secondary | ICD-10-CM | POA: Diagnosis not present

## 2021-09-27 DIAGNOSIS — R54 Age-related physical debility: Secondary | ICD-10-CM | POA: Diagnosis present

## 2021-09-27 DIAGNOSIS — E870 Hyperosmolality and hypernatremia: Secondary | ICD-10-CM | POA: Diagnosis present

## 2021-09-27 DIAGNOSIS — R0602 Shortness of breath: Secondary | ICD-10-CM | POA: Diagnosis not present

## 2021-09-27 DIAGNOSIS — Z515 Encounter for palliative care: Secondary | ICD-10-CM

## 2021-09-27 DIAGNOSIS — U071 COVID-19: Secondary | ICD-10-CM | POA: Diagnosis not present

## 2021-09-27 DIAGNOSIS — D631 Anemia in chronic kidney disease: Secondary | ICD-10-CM | POA: Diagnosis present

## 2021-09-27 DIAGNOSIS — Z8673 Personal history of transient ischemic attack (TIA), and cerebral infarction without residual deficits: Secondary | ICD-10-CM

## 2021-09-27 DIAGNOSIS — Z955 Presence of coronary angioplasty implant and graft: Secondary | ICD-10-CM

## 2021-09-27 DIAGNOSIS — S3993XA Unspecified injury of pelvis, initial encounter: Secondary | ICD-10-CM | POA: Diagnosis not present

## 2021-09-27 DIAGNOSIS — Z8249 Family history of ischemic heart disease and other diseases of the circulatory system: Secondary | ICD-10-CM

## 2021-09-27 DIAGNOSIS — Z833 Family history of diabetes mellitus: Secondary | ICD-10-CM

## 2021-09-27 DIAGNOSIS — Z634 Disappearance and death of family member: Secondary | ICD-10-CM

## 2021-09-27 DIAGNOSIS — Z8701 Personal history of pneumonia (recurrent): Secondary | ICD-10-CM

## 2021-09-27 DIAGNOSIS — S199XXA Unspecified injury of neck, initial encounter: Secondary | ICD-10-CM | POA: Diagnosis not present

## 2021-09-27 DIAGNOSIS — S8011XA Contusion of right lower leg, initial encounter: Secondary | ICD-10-CM | POA: Diagnosis present

## 2021-09-27 DIAGNOSIS — A419 Sepsis, unspecified organism: Secondary | ICD-10-CM | POA: Diagnosis not present

## 2021-09-27 DIAGNOSIS — B957 Other staphylococcus as the cause of diseases classified elsewhere: Secondary | ICD-10-CM | POA: Diagnosis not present

## 2021-09-27 DIAGNOSIS — Z6821 Body mass index (BMI) 21.0-21.9, adult: Secondary | ICD-10-CM

## 2021-09-27 DIAGNOSIS — M79601 Pain in right arm: Secondary | ICD-10-CM | POA: Diagnosis not present

## 2021-09-27 LAB — CBC WITH DIFFERENTIAL/PLATELET
Abs Immature Granulocytes: 0.09 10*3/uL — ABNORMAL HIGH (ref 0.00–0.07)
Basophils Absolute: 0 10*3/uL (ref 0.0–0.1)
Basophils Relative: 0 %
Eosinophils Absolute: 0 10*3/uL (ref 0.0–0.5)
Eosinophils Relative: 0 %
HCT: 27.2 % — ABNORMAL LOW (ref 36.0–46.0)
Hemoglobin: 8.2 g/dL — ABNORMAL LOW (ref 12.0–15.0)
Immature Granulocytes: 1 %
Lymphocytes Relative: 17 %
Lymphs Abs: 1.2 10*3/uL (ref 0.7–4.0)
MCH: 30.9 pg (ref 26.0–34.0)
MCHC: 30.1 g/dL (ref 30.0–36.0)
MCV: 102.6 fL — ABNORMAL HIGH (ref 80.0–100.0)
Monocytes Absolute: 0.7 10*3/uL (ref 0.1–1.0)
Monocytes Relative: 9 %
Neutro Abs: 5.4 10*3/uL (ref 1.7–7.7)
Neutrophils Relative %: 73 %
Platelets: 182 10*3/uL (ref 150–400)
RBC: 2.65 MIL/uL — ABNORMAL LOW (ref 3.87–5.11)
RDW: 15.3 % (ref 11.5–15.5)
WBC: 7.4 10*3/uL (ref 4.0–10.5)
nRBC: 0 % (ref 0.0–0.2)

## 2021-09-27 LAB — TROPONIN I (HIGH SENSITIVITY)
Troponin I (High Sensitivity): 33 ng/L — ABNORMAL HIGH (ref <18)
Troponin I (High Sensitivity): 33 ng/L — ABNORMAL HIGH (ref <18)

## 2021-09-27 LAB — COMPREHENSIVE METABOLIC PANEL
ALT: 19 U/L (ref 0–44)
AST: 37 U/L (ref 15–41)
Albumin: 2.4 g/dL — ABNORMAL LOW (ref 3.5–5.0)
Alkaline Phosphatase: 48 U/L (ref 38–126)
Anion gap: 9 (ref 5–15)
BUN: 66 mg/dL — ABNORMAL HIGH (ref 8–23)
CO2: 22 mmol/L (ref 22–32)
Calcium: 8 mg/dL — ABNORMAL LOW (ref 8.9–10.3)
Chloride: 112 mmol/L — ABNORMAL HIGH (ref 98–111)
Creatinine, Ser: 4.39 mg/dL — ABNORMAL HIGH (ref 0.44–1.00)
GFR, Estimated: 9 mL/min — ABNORMAL LOW (ref >60)
Glucose, Bld: 97 mg/dL (ref 70–99)
Potassium: 4.8 mmol/L (ref 3.5–5.1)
Sodium: 143 mmol/L (ref 135–145)
Total Bilirubin: 0.4 mg/dL (ref 0.3–1.2)
Total Protein: 6.1 g/dL — ABNORMAL LOW (ref 6.5–8.1)

## 2021-09-27 LAB — RESP PANEL BY RT-PCR (FLU A&B, COVID) ARPGX2
Influenza A by PCR: NEGATIVE
Influenza B by PCR: NEGATIVE
SARS Coronavirus 2 by RT PCR: POSITIVE — AB

## 2021-09-27 LAB — LACTIC ACID, PLASMA
Lactic Acid, Venous: 1.7 mmol/L (ref 0.5–1.9)
Lactic Acid, Venous: 1.1 mmol/L (ref 0.5–1.9)

## 2021-09-27 LAB — BRAIN NATRIURETIC PEPTIDE: B Natriuretic Peptide: 177.7 pg/mL — ABNORMAL HIGH (ref 0.0–100.0)

## 2021-09-27 LAB — BLOOD GAS, VENOUS
Acid-base deficit: 5.7 mmol/L — ABNORMAL HIGH (ref 0.0–2.0)
Bicarbonate: 20.1 mmol/L (ref 20.0–28.0)
O2 Saturation: 66 %
Patient temperature: 37
pCO2, Ven: 40 mmHg — ABNORMAL LOW (ref 44.0–60.0)
pH, Ven: 7.31 (ref 7.250–7.430)
pO2, Ven: 38 mmHg (ref 32.0–45.0)

## 2021-09-27 LAB — TYPE AND SCREEN
ABO/RH(D): A POS
Antibody Screen: NEGATIVE

## 2021-09-27 LAB — APTT: aPTT: 34 seconds (ref 24–36)

## 2021-09-27 LAB — PROTIME-INR
INR: 1.2 (ref 0.8–1.2)
Prothrombin Time: 14.9 seconds (ref 11.4–15.2)

## 2021-09-27 LAB — PROCALCITONIN: Procalcitonin: 26.31 ng/mL

## 2021-09-27 MED ORDER — RALOXIFENE HCL 60 MG PO TABS
60.0000 mg | ORAL_TABLET | Freq: Every day | ORAL | Status: DC
  Administered 2021-09-29 – 2021-09-30 (×2): 60 mg via ORAL
  Filled 2021-09-27 (×5): qty 1

## 2021-09-27 MED ORDER — LEVOTHYROXINE SODIUM 50 MCG PO TABS
50.0000 ug | ORAL_TABLET | Freq: Every day | ORAL | Status: DC
  Administered 2021-09-28 – 2021-10-01 (×4): 50 ug via ORAL
  Filled 2021-09-27 (×5): qty 1

## 2021-09-27 MED ORDER — ACETAMINOPHEN 650 MG RE SUPP: 650.0000 mg | Freq: Four times a day (QID) | RECTAL | Status: DC | PRN

## 2021-09-27 MED ORDER — HYDRALAZINE HCL 20 MG/ML IJ SOLN: 5.0000 mg | INTRAMUSCULAR | Status: DC | PRN

## 2021-09-27 MED ORDER — MORPHINE SULFATE (PF) 2 MG/ML IV SOLN: 2.0000 mg | INTRAVENOUS | Status: DC | PRN

## 2021-09-27 MED ORDER — FERROUS SULFATE 325 (65 FE) MG PO TABS
325.0000 mg | ORAL_TABLET | Freq: Two times a day (BID) | ORAL | Status: DC
  Administered 2021-09-28 – 2021-09-30 (×5): 325 mg via ORAL
  Filled 2021-09-27 (×7): qty 1

## 2021-09-27 MED ORDER — GUAIFENESIN ER 600 MG PO TB12: 600.0000 mg | ORAL_TABLET | Freq: Two times a day (BID) | ORAL | Status: DC | PRN

## 2021-09-27 MED ORDER — ISOSORBIDE MONONITRATE ER 30 MG PO TB24
60.0000 mg | ORAL_TABLET | Freq: Every day | ORAL | Status: DC
  Administered 2021-09-29 – 2021-09-30 (×2): 60 mg via ORAL
  Filled 2021-09-27: qty 2
  Filled 2021-09-27 (×2): qty 1

## 2021-09-27 MED ORDER — SODIUM CHLORIDE 0.9 % IV SOLN
200.0000 mg | Freq: Once | INTRAVENOUS | Status: AC
  Administered 2021-09-28: 200 mg via INTRAVENOUS
  Filled 2021-09-27: qty 200

## 2021-09-27 MED ORDER — CEFEPIME HCL 2 G IJ SOLR
2.0000 g | Freq: Once | INTRAMUSCULAR | Status: AC
  Administered 2021-09-27: 2 g via INTRAVENOUS
  Filled 2021-09-27: qty 2

## 2021-09-27 MED ORDER — SODIUM CHLORIDE 0.9 % IV SOLN: INTRAVENOUS | Status: AC

## 2021-09-27 MED ORDER — ONDANSETRON HCL 4 MG PO TABS: 4.0000 mg | ORAL_TABLET | Freq: Four times a day (QID) | ORAL | Status: DC | PRN

## 2021-09-27 MED ORDER — HEPARIN SODIUM (PORCINE) 5000 UNIT/ML IJ SOLN
5000.0000 [IU] | Freq: Three times a day (TID) | INTRAMUSCULAR | Status: DC
  Administered 2021-09-28 – 2021-10-02 (×11): 5000 [IU] via SUBCUTANEOUS
  Filled 2021-09-27 (×11): qty 1

## 2021-09-27 MED ORDER — POLYETHYLENE GLYCOL 3350 17 G PO PACK: 17.0000 g | PACK | Freq: Every day | ORAL | Status: DC | PRN

## 2021-09-27 MED ORDER — SODIUM CHLORIDE 0.9 % IV BOLUS
500.0000 mL | Freq: Once | INTRAVENOUS | Status: AC
  Administered 2021-09-27: 500 mL via INTRAVENOUS

## 2021-09-27 MED ORDER — VANCOMYCIN HCL 1250 MG/250ML IV SOLN
1250.0000 mg | Freq: Once | INTRAVENOUS | Status: AC
  Administered 2021-09-27: 1250 mg via INTRAVENOUS
  Filled 2021-09-27: qty 250

## 2021-09-27 MED ORDER — SODIUM CHLORIDE 0.9 % IV SOLN
100.0000 mg | Freq: Every day | INTRAVENOUS | Status: AC
  Administered 2021-09-28 – 2021-10-01 (×4): 100 mg via INTRAVENOUS
  Filled 2021-09-27 (×3): qty 100
  Filled 2021-09-27: qty 20

## 2021-09-27 MED ORDER — HYDROCODONE-ACETAMINOPHEN 5-325 MG PO TABS: 1.0000 | ORAL_TABLET | ORAL | Status: DC | PRN

## 2021-09-27 MED ORDER — ACETAMINOPHEN 325 MG PO TABS: 650.0000 mg | ORAL_TABLET | Freq: Four times a day (QID) | ORAL | Status: DC | PRN

## 2021-09-27 MED ORDER — ALBUTEROL SULFATE (2.5 MG/3ML) 0.083% IN NEBU: 2.5000 mg | INHALATION_SOLUTION | RESPIRATORY_TRACT | Status: DC | PRN

## 2021-09-27 MED ORDER — ONDANSETRON HCL 4 MG/2ML IJ SOLN: 4.0000 mg | Freq: Four times a day (QID) | INTRAMUSCULAR | Status: DC | PRN

## 2021-09-27 MED ORDER — CALCITRIOL 0.25 MCG PO CAPS
0.2500 ug | ORAL_CAPSULE | ORAL | Status: DC
  Administered 2021-09-30: 0.25 ug via ORAL
  Filled 2021-09-27 (×2): qty 1

## 2021-09-27 MED ORDER — DOCUSATE SODIUM 100 MG PO CAPS
100.0000 mg | ORAL_CAPSULE | Freq: Two times a day (BID) | ORAL | Status: DC
  Administered 2021-09-28 – 2021-09-30 (×3): 100 mg via ORAL
  Filled 2021-09-27 (×5): qty 1

## 2021-09-27 MED ORDER — PANTOPRAZOLE SODIUM 40 MG PO TBEC: 40.0000 mg | DELAYED_RELEASE_TABLET | Freq: Every day | ORAL | Status: DC

## 2021-09-27 MED ORDER — ROSUVASTATIN CALCIUM 10 MG PO TABS
10.0000 mg | ORAL_TABLET | Freq: Every day | ORAL | Status: DC
  Administered 2021-09-29 – 2021-09-30 (×2): 10 mg via ORAL
  Filled 2021-09-27 (×4): qty 1

## 2021-09-27 MED ORDER — FUROSEMIDE 20 MG PO TABS
20.0000 mg | ORAL_TABLET | Freq: Two times a day (BID) | ORAL | Status: AC
  Administered 2021-09-28: 20 mg via ORAL
  Filled 2021-09-27: qty 1

## 2021-09-27 MED ORDER — BISACODYL 5 MG PO TBEC: 5.0000 mg | DELAYED_RELEASE_TABLET | Freq: Every day | ORAL | Status: DC | PRN

## 2021-09-27 MED ORDER — SODIUM CHLORIDE 0.9 % IV SOLN
1.0000 g | INTRAVENOUS | Status: DC
  Administered 2021-09-28 – 2021-09-29 (×2): 1 g via INTRAVENOUS
  Filled 2021-09-27 (×3): qty 1

## 2021-09-27 MED ORDER — CLOPIDOGREL BISULFATE 75 MG PO TABS
75.0000 mg | ORAL_TABLET | Freq: Every day | ORAL | Status: DC
  Administered 2021-09-29 – 2021-09-30 (×2): 75 mg via ORAL
  Filled 2021-09-27 (×3): qty 1

## 2021-09-27 MED ORDER — SODIUM BICARBONATE 650 MG PO TABS
650.0000 mg | ORAL_TABLET | Freq: Every day | ORAL | Status: DC
  Administered 2021-09-29 – 2021-09-30 (×2): 650 mg via ORAL
  Filled 2021-09-27 (×3): qty 1

## 2021-09-27 MED ORDER — VANCOMYCIN HCL IN DEXTROSE 1-5 GM/200ML-% IV SOLN
1000.0000 mg | Freq: Once | INTRAVENOUS | Status: DC
  Filled 2021-09-27: qty 200

## 2021-09-27 NOTE — ED Notes (Signed)
Patient resting comfortably on right side. RR even and unlabored. Patient's granddaughter remains at bedside but does not believe the patient to be in any pain or distress. Patient's brief checked but does not need to be changed at this time. Patient does not appear to be in any pain or distress.

## 2021-09-27 NOTE — ED Notes (Signed)
Patient resting comfortably on right side. RR even and unlabored. Patient had brief changed and perineal care performed by staff. Patient and granddaughter updated on plan of care. Patient remains alert per norm but nonverbal. No needs verbalized.

## 2021-09-27 NOTE — Progress Notes (Signed)
Pharmacy Antibiotic Note  Crystal Faulkner is a 88 y.o. female admitted on 09/27/2021 with pneumonia.  Pharmacy has been consulted for cefepime, vancomycin dosing.  Pt is acute on chronic kidney disease but not on HD.   Plan: Cefepime 2 gm IV X 1 given in ED on 2/3 @ 2122. Cefepime 1 gm IV Q24H ordered to start on 2/4 @ 2100.  Vancomycin 1250 mg IV X 1 ordered for 2/3 @ ~ 2300.   - based on current PK values, a vanc dose of 500 mg IV Q48H would result in AUC of 736 and Vanc trough of 24.6.    - Will give a single loading dose of Vanc 1250 mg and dose by levels.  - Will draw random vanc level on 2/4 @ 2300.     Height: 5' (152.4 cm) Weight: 52 kg (114 lb 10.2 oz) IBW/kg (Calculated) : 45.5  Temp (24hrs), Avg:97.6 F (36.4 C), Min:97.6 F (36.4 C), Max:97.6 F (36.4 C)  Recent Labs  Lab 09/23/21 1830 09/27/21 1844 09/27/21 2050  WBC 7.9 7.4  --   CREATININE 4.03* 4.39*  --   LATICACIDVEN  --  1.7 1.1    Estimated Creatinine Clearance: 5.6 mL/min (A) (by C-G formula based on SCr of 4.39 mg/dL (H)).    No Known Allergies  Antimicrobials this admission:   >>    >>   Dose adjustments this admission:   Microbiology results:  BCx:   UCx:    Sputum:    MRSA PCR:   Thank you for allowing pharmacy to be a part of this patients care.  Marshawn Normoyle D 09/27/2021 10:30 PM

## 2021-09-27 NOTE — ED Provider Notes (Signed)
[facility] Provider Note    Event Date/Time   First MD Initiated Contact with Patient 09/27/21 1851     (approximate)   History   Altered Mental Status   HPI  Crystal Faulkner is a 88 y.o. female with past medical history of CVA with residual aphasia and hemiparesis, acute on chronic anemia, CHF, CKD, hypothyroidism who presents with altered mental status.  Patient was seen in the emergency department 5 days ago and was diagnosed with COVID.  Since being home she has had progressive weakness loss of appetite and worsening mental status.  Patient at baseline is aphasic but is normally awake and can interact somewhat.  She has been much more tired and today was dozing off.  Has also had a fall today onto the right arm, unclear if hit head.  Patient is unable to provide history.  History obtained from patient's daughter and granddaughter.  Has had black stools but this is baseline for her since she is on iron supplements.    Past Medical History:  Diagnosis Date   Allergy    Alzheimer's disease ([place])    Anemia    CCF (congestive cardiac failure) (HCC)    Chronic kidney disease    Chronic stable angina (HCC)    Frequent falls    Gout    H/O: GI bleed    Hypercholesteremia    Hyperglycemia    Hyperlipidemia    Hypertension    Incontinence    Mixed dementia (HCC)    Myocardial infarction (HCC)    Numbness of right hand    Osteoporosis    Right hand weakness    Stroke ([place])    Thyroid disease    Vitamin D deficiency     Patient Active Problem List   Diagnosis Date Noted   Acute on chronic anemia 08/09/2021   Community acquired pneumonia 08/07/2021   Sepsis ([place]) 10/15/2018   Osteoarthritis 11/19/2017   Dysarthria due to recent cerebrovascular accident (CVA) 06/23/2016   Hemiparesis affecting right side as late effect of cerebrovascular accident (CVA) ([place]) 06/23/2016   CKD (chronic kidney disease) stage 5, GFR less than 15 ml/min ([place])  06/23/2016   Senile purpura ([place]) 05/13/2016   Hiatal hernia 05/04/2016   Thoracic aorta atherosclerosis ([place]) 05/04/2016   Chronic stable angina ([place]) 10/16/2015   Secondary hyperparathyroidism ([place]) 04/13/2015   Arteriosclerosis of coronary artery 04/08/2015   Benign hypertension 04/08/2015   Controlled gout 04/08/2015   History of peptic ulcer disease 04/08/2015   Adult hypothyroidism 04/08/2015   Mild cognitive disorder 04/08/2015   Anemia of chronic disease 04/08/2015   Osteoarthrosis involving more than one site but not generalized 04/08/2015   Osteoporosis 04/08/2015   Pelvic muscle wasting 04/08/2015   Allergic rhinitis 04/08/2015   Chronic diastolic CHF (congestive heart failure) ([place]) 03/29/2014   HLD (hyperlipidemia) 03/29/2014   H/O gastrointestinal hemorrhage 04/05/2007     Physical Exam  Triage Vital Signs: ED Triage Vitals  Enc Vitals Group     BP 09/27/21 1845 125/73     Pulse Rate 09/27/21 1845 78     Resp 09/27/21 1845 16     Temp --      Temp src --      SpO2 09/27/21 1840 (!) 80 %     Weight 09/27/21 1846 114 lb 10.2 oz (52 kg)     Height 09/27/21 1846 5' (1.524 m)     Head Circumference --      Peak  Flow --      Pain Score --      Pain Loc --      Pain Edu? --      Excl. in [place]? --     Most recent vital signs: Vitals:   09/27/21 1840 09/27/21 1845  BP:  125/73  Pulse:  78  Resp:  16  SpO2: (!) 80% 100%     General: Awake, patient appears chronically ill CV:  Good peripheral perfusion.  1+ edema bilaterally Resp:  Normal effort.  Abd:  No distention.  Neuro:             Eyes are open she is awake, does not have any verbal response, does not follow commands but does track Other:  Ecchymosis of the right upper and lower arm with significant swelling of the distal forearm and hand without focal tenderness to palpation, ecchymosis over the right chest wall Ecchymosis over the bilateral lower extremities, no focal tenderness or swelling   ED  Results / Procedures / Treatments  Labs (all labs ordered are listed, but only abnormal results are displayed) Labs Reviewed  BLOOD GAS, VENOUS - Abnormal; Notable for the following components:      Result Value   pCO2, Ven 40 (*)    Acid-base deficit 5.7 (*)    All other components within normal limits  COMPREHENSIVE METABOLIC PANEL - Abnormal; Notable for the following components:   Chloride 112 (*)    BUN 66 (*)    Creatinine, Ser 4.39 (*)    Calcium 8.0 (*)    Total Protein 6.1 (*)    Albumin 2.4 (*)    GFR, Estimated 9 (*)    All other components within normal limits  CBC WITH DIFFERENTIAL/PLATELET - Abnormal; Notable for the following components:   RBC 2.65 (*)    Hemoglobin 8.2 (*)    HCT 27.2 (*)    MCV 102.6 (*)    Abs Immature Granulocytes 0.09 (*)    All other components within normal limits  TROPONIN I (HIGH SENSITIVITY) - Abnormal; Notable for the following components:   Troponin I (High Sensitivity) 33 (*)    All other components within normal limits  RESP PANEL BY RT-PCR (FLU A&B, COVID) ARPGX2  CULTURE, BLOOD (ROUTINE X 2)  CULTURE, BLOOD (ROUTINE X 2)  LACTIC ACID, PLASMA  PROTIME-INR  APTT  LACTIC ACID, PLASMA  BRAIN NATRIURETIC PEPTIDE  PROCALCITONIN  URINALYSIS, COMPLETE (UACMP) WITH MICROSCOPIC  TYPE AND SCREEN  TROPONIN I (HIGH SENSITIVITY)     EKG   EKG KG interpreted by myself, normal sinus rhythm, normal axis T wave inversion V3-V5 without acute ischemic changes   RADIOLOGY I reviewed the chest x-ray which shows a right middle lobe infiltrate   PROCEDURES:  Critical Care performed: No  .1-3 Lead EKG Interpretation Performed by: Rada Hay, MD Authorized by: Rada Hay, MD     Interpretation: normal     ECG rate assessment: normal     Rhythm: sinus rhythm     Ectopy: none     Conduction: normal    The patient is on the cardiac monitor to evaluate for evidence of arrhythmia and/or significant heart rate  changes.   MEDICATIONS ORDERED IN ED: Medications  vancomycin (VANCOCIN) IVPB 1000 mg/200 mL premix (has no administration in time range)  ceFEPIme (MAXIPIME) 2 g in sodium chloride 0.9 % 100 mL IVPB (has no administration in time range)  sodium chloride 0.9 % bolus 500 mL (has  no administration in time range)     IMPRESSION / MDM / ASSESSMENT AND PLAN / ED COURSE  I reviewed the triage vital signs and the nursing notes.                              Differential diagnosis includes, but is not limited to, COVID-19, pneumonia, ICH, metabolic abnormality, dehydration, renal failure  Patient is a 88 year old female with multiple chronic illnesses recently diagnosed with COVID 5 days ago presents with progressive weakness decreased appetite and change in mental status.  With EMS she was hypoxic to the mid 70s placed on a nonrebreather.  On arrival to the ED placed on 6 L nasal cannula, waveform intermittently picking up but at 100% at times.  Blood pressure is normal.  Patient looks chronically ill and malnourished appearing.  She has also has ecchymosis over the bilateral upper and lower extremities.  Right hand and forearm are swollen.  She does have black stool on exam, this is chronic for her as she takes iron supplements I am told by her granddaughter.  Patient is DNR.  We will work-up broadly.  She will need imaging of her head and neck given the fall and change in mental status.  We will also x-ray the right upper extremity.  We will go gentle on fluids since she has a history of CHF and blood pressure is now normalized.   Patient's laboratory work is overall reassuring, no leukocytosis, hemoglobin is 8 which is around baseline, creatinine around 4 which is also her baseline.  Troponin 33, EKG nonspecific, suspect that this is demand related.  Lactate is normal.  Chest x-ray reviewed by myself shows a right middle lobe infiltrate.  Procalcitonin is pending but will cover with empiric  vancomycin and cefepime given she was recently admitted about 2 months ago for pneumonia and covered with IV antibiotics at that time she is at greater risk for MRSA and Pseudomonas.  CT of the head and neck did not have any acute traumatic pathology.  X-rays of the right arm overall are not just revealing for a possible fracture the base of the fifth proximal phalanx and a chronic ulnar styloid fracture.  Patient's oxygen was titrated down to 2 L and she continues to saturate well.  Discussed with the hospitalist for admission.     FINAL CLINICAL IMPRESSION(S) / ED DIAGNOSES   Final diagnoses:  Community acquired pneumonia of right middle lobe of lung     Rx / DC Orders   ED Discharge Orders     None        Note:  This document was prepared using Dragon voice recognition software and may include unintentional dictation errors.   Rada Hay, MD 09/27/21 2051

## 2021-09-27 NOTE — ED Triage Notes (Signed)
Pt to ER via EMS from home with c/o decreased level of consciousness.  Pt was diagnosed here on Monday with COVID and has been getting worse since then.  Today on EMS arrival pt was lethargic, BP, O2 sats decreased.  Pt was given 526ml NS bolus and placed on NRB mask.  Family states pt has a fall this AM.

## 2021-09-27 NOTE — ED Notes (Signed)
Patient resting comfortably on stretcher. RR even and unlabored. Patient remains on 5lpm via [place] with sats of 100%. Patient is nonverbal but per family and her demeanor, she does not appear to be in any distress or pain. Patient is per her norm per family now.

## 2021-09-27 NOTE — Assessment & Plan Note (Deleted)
Pt presenting with sob and found to have pneumonia we will treat with vancomycin and cefepime for HCAP and also covid .

## 2021-09-27 NOTE — ED Notes (Signed)
Patient to CT via stretcher. Tech updated on patient's right arm complaint and nonverbal status.

## 2021-09-27 NOTE — ED Notes (Signed)
COVID swab, Venous blood gas, and two sets of blood cultures collected (1930 and 1935)

## 2021-09-27 NOTE — H&P (Signed)
History and Physical    Patient: Crystal Faulkner:[id number] DOB: 02-27-1925 DOA: 09/27/2021 DOS: the patient was seen and examined on 09/27/2021 PCP: Steele Sizer, MD  Patient coming from: Home  Chief Complaint:  Chief Complaint  Patient presents with   Altered Mental Status    HPI: Crystal Faulkner is a 88 y.o. female presenting with AMS and recent covid diagnosis on Monday and since then pt has been declining with mentation and generalized weakness and worsening mental status and somnolent. HPI is difficult as pt is aphasic and cant give history. Pt was noted to have fall today.   Review of Systems:  Review of Systems  Constitutional:  Negative for chills.  Neurological:  Negative for headaches.  All other systems reviewed and are negative.  Past Medical History:  Diagnosis Date   Allergy    Alzheimer's disease ([place])    Anemia    CCF (congestive cardiac failure) (HCC)    Chronic kidney disease    Chronic stable angina (HCC)    Frequent falls    Gout    H/O: GI bleed    Hypercholesteremia    Hyperglycemia    Hyperlipidemia    Hypertension    Incontinence    Mixed dementia (HCC)    Myocardial infarction (HCC)    Numbness of right hand    Osteoporosis    Right hand weakness    Stroke ([place])    Thyroid disease    Vitamin D deficiency    Past Surgical History:  Procedure Laterality Date   EXPLORATORY LAPAROTOMY     EYE SURGERY Bilateral    cataract   Social History:  reports that she has never smoked. She has quit using smokeless tobacco.  Her smokeless tobacco use included snuff. She reports that she does not drink alcohol and does not use drugs.  No Known Allergies  Family History  Problem Relation Age of Onset   Diabetes Sister    Hypertension Sister    Hypertension Brother    Heart disease Brother     Prior to Admission medications   Medication Sig Start Date End Date Taking? Authorizing Provider  allopurinol (ZYLOPRIM) 100 MG tablet TAKE 1  TABLET BY MOUTH EVERY DAY Patient taking differently: Take 50 mg by mouth daily. 07/10/21   Steele Sizer, MD  calcitRIOL (ROCALTROL) 0.25 MCG capsule Take 0.25 mcg by mouth as directed. Takes three times a week  mon, wed, and fri. 09/05/19   [provider]  clopidogrel (PLAVIX) 75 MG tablet TAKE 1 TABLET BY MOUTH EVERY DAY 08/28/20   Steele Sizer, MD  ferrous sulfate 325 (65 FE) MG tablet Take 325 mg by mouth 2 (two) times daily with a meal.    [provider]  furosemide (LASIX) 40 MG tablet Take 1 tablet (40 mg total) by mouth every other day. 10/17/18 08/07/21  Gladstone Lighter, MD  guaiFENesin (ROBITUSSIN) 100 MG/5ML liquid Take 5 mLs by mouth every 4 (four) hours as needed for cough or to loosen phlegm. 08/10/21   Nicole Kindred A, DO  isosorbide mononitrate (IMDUR) 60 MG 24 hr tablet TAKE 1 TABLET BY MOUTH EVERY DAY 10/08/20   Ancil Boozer, Drue Stager, MD  levothyroxine (SYNTHROID) 50 MCG tablet TAKE 1 TABLET (50 MCG TOTAL) BY MOUTH DAILY AT 6 (SIX) AM. 08/03/21   Steele Sizer, MD  pantoprazole (PROTONIX) 40 MG tablet TAKE 1 TABLET BY MOUTH EVERY DAY 07/02/21   Steele Sizer, MD  raloxifene (EVISTA) 60 MG tablet TAKE 1 TABLET  BY MOUTH EVERY DAY 08/17/21   Ancil Boozer, Drue Stager, MD  rosuvastatin (CRESTOR) 10 MG tablet TAKE 1 TABLET BY MOUTH EVERY DAY 08/28/20   Steele Sizer, MD  sodium bicarbonate 650 MG tablet Take 650 mg by mouth daily.  03/19/15   [provider]    Physical Exam: Vitals:   09/27/21 1840 09/27/21 1845 09/27/21 1846 09/27/21 2106  BP:  125/73    Pulse:  78    Resp:  16    Temp:    97.6 F (36.4 C)  SpO2: (!) 80% 100%    Weight:   52 kg   Height:   5' (1.524 m)   Physical Exam Vitals and nursing note reviewed.  Constitutional:      Appearance: She is obese.  HENT:     Head: Normocephalic and atraumatic.     Right Ear: External ear normal.     Left Ear: External ear normal.     Nose: Nose normal.     Mouth/Throat:     Pharynx: No  oropharyngeal exudate.  Cardiovascular:     Rate and Rhythm: Regular rhythm. Tachycardia present.  Neurological:     Mental Status: She is alert.    Labs: Venous blood gas shows pco2 of 40. CMP shows glucose of 112 and creatinine of 4.39,  Lab Results  Component Value Date   CREATININE 4.39 (H) 09/27/2021   CREATININE 4.03 (H) 09/23/2021   CREATININE 3.01 (H) 08/10/2021  Lft's are normal. BNP of 177.7 TNI of 33 and repeat 33.  Normal lactic acid 1.7 then 1.1.    Assessment and Plan: * Pneumonia- (present on admission) Pt presenting with sob and found to have pneumonia we will treat with vancomycin and cefepime for HCAP and also covid .   Benign hypertension- (present on admission) Blood pressure 125/73, pulse 78, temperature 97.6 F (36.4 C), resp. rate 16, height 5' (1.524 m), weight 52 kg, SpO2 100 %. we will cont pt on imdur and hydralazine.    Adult hypothyroidism- (present on admission) We will continue levothyroxine at 50 mcg .  Acute kidney injury superimposed on chronic kidney disease ([place])- (present on admission) Lab Results  Component Value Date   CREATININE 4.39 (H) 09/27/2021   CREATININE 4.03 (H) 09/23/2021   CREATININE 3.01 (H) 08/10/2021  we will hydrate gently and  Avoid contrast and renally dose a  ll mekl   Advance Care Planning:   Code Status: DNR DNR  Consults: none   Family Communication: Slade,Ilyssa (Daughter)  [phone] (Home Phone)  Severity of Illness: The appropriate patient status for this patient is INPATIENT. Inpatient status is judged to be reasonable and necessary in order to provide the required intensity of service to ensure the patient's safety. The patient's presenting symptoms, physical exam findings, and initial radiographic and laboratory data in the context of their chronic comorbidities is felt to place them at high risk for further clinical deterioration. Furthermore, it is not anticipated that the patient will be  medically stable for discharge from the hospital within 2 midnights of admission.   * I certify that at the point of admission it is my clinical judgment that the patient will require inpatient hospital care spanning beyond 2 midnights from the point of admission due to high intensity of service, high risk for further deterioration and high frequency of surveillance required.*  Author: Para Skeans, MD 09/27/2021 10:57 PM  For on call review [email or web address].

## 2021-09-27 NOTE — ED Notes (Signed)
Radiology at bedside to obtained xrays of arm and chest.Patient in obvious discomfort of right hand and/or arm.

## 2021-09-27 NOTE — Assessment & Plan Note (Deleted)
Blood pressure 125/73, pulse 78, temperature 97.6 F (36.4 C), resp. rate 16, height 5' (1.524 m), weight 52 kg, SpO2 100 %. we will cont pt on imdur and hydralazine.

## 2021-09-27 NOTE — Progress Notes (Signed)
Remdesivir - Pharmacy Brief Note   O:  ALT: 19  CXR: right middle lobe infiltrate  SpO2: 70's % on RA , on NRB   A/P:  Remdesivir 200 mg IVPB once followed by 100 mg IVPB daily x 4 days.   Crystal Faulkner D 09/27/2021 11:28 PM

## 2021-09-27 NOTE — Assessment & Plan Note (Addendum)
Last creatinine 3.42.  Creatinine peaked at 4.20

## 2021-09-27 NOTE — Assessment & Plan Note (Deleted)
We will continue levothyroxine at 50 mcg .

## 2021-09-28 ENCOUNTER — Inpatient Hospital Stay: Payer: Medicare HMO

## 2021-09-28 DIAGNOSIS — Z8673 Personal history of transient ischemic attack (TIA), and cerebral infarction without residual deficits: Secondary | ICD-10-CM

## 2021-09-28 DIAGNOSIS — J1282 Pneumonia due to coronavirus disease 2019: Secondary | ICD-10-CM | POA: Diagnosis not present

## 2021-09-28 DIAGNOSIS — U071 COVID-19: Secondary | ICD-10-CM | POA: Diagnosis not present

## 2021-09-28 LAB — BASIC METABOLIC PANEL
Anion gap: 11 (ref 5–15)
BUN: 60 mg/dL — ABNORMAL HIGH (ref 8–23)
CO2: 17 mmol/L — ABNORMAL LOW (ref 22–32)
Calcium: 7.8 mg/dL — ABNORMAL LOW (ref 8.9–10.3)
Chloride: 115 mmol/L — ABNORMAL HIGH (ref 98–111)
Creatinine, Ser: 4.08 mg/dL — ABNORMAL HIGH (ref 0.44–1.00)
GFR, Estimated: 10 mL/min — ABNORMAL LOW (ref >60)
Glucose, Bld: 81 mg/dL (ref 70–99)
Potassium: 4.3 mmol/L (ref 3.5–5.1)
Sodium: 143 mmol/L (ref 135–145)

## 2021-09-28 LAB — CBC
HCT: 24.4 % — ABNORMAL LOW (ref 36.0–46.0)
Hemoglobin: 7.4 g/dL — ABNORMAL LOW (ref 12.0–15.0)
MCH: 31.2 pg (ref 26.0–34.0)
MCHC: 30.3 g/dL (ref 30.0–36.0)
MCV: 103 fL — ABNORMAL HIGH (ref 80.0–100.0)
Platelets: 190 10*3/uL (ref 150–400)
RBC: 2.37 MIL/uL — ABNORMAL LOW (ref 3.87–5.11)
RDW: 15.3 % (ref 11.5–15.5)
WBC: 6.3 10*3/uL (ref 4.0–10.5)
nRBC: 0 % (ref 0.0–0.2)

## 2021-09-28 LAB — URINALYSIS, COMPLETE (UACMP) WITH MICROSCOPIC
Bilirubin Urine: NEGATIVE
Glucose, UA: NEGATIVE mg/dL
Ketones, ur: NEGATIVE mg/dL
Nitrite: NEGATIVE
Protein, ur: NEGATIVE mg/dL
Specific Gravity, Urine: 1.01 (ref 1.005–1.030)
pH: 5.5 (ref 5.0–8.0)

## 2021-09-28 LAB — BLOOD CULTURE ID PANEL (REFLEXED) - BCID2

## 2021-09-28 LAB — HEMOGLOBIN AND HEMATOCRIT, BLOOD
HCT: 27.3 % — ABNORMAL LOW (ref 36.0–46.0)
HCT: 27.8 % — ABNORMAL LOW (ref 36.0–46.0)
Hemoglobin: 8.2 g/dL — ABNORMAL LOW (ref 12.0–15.0)
Hemoglobin: 8.5 g/dL — ABNORMAL LOW (ref 12.0–15.0)

## 2021-09-28 LAB — STREP PNEUMONIAE URINARY ANTIGEN: Strep Pneumo Urinary Antigen: NEGATIVE

## 2021-09-28 LAB — MRSA NEXT GEN BY PCR, NASAL: MRSA by PCR Next Gen: NOT DETECTED

## 2021-09-28 MED ORDER — ALLOPURINOL 100 MG PO TABS
50.0000 mg | ORAL_TABLET | Freq: Every day | ORAL | Status: DC
  Administered 2021-09-29 – 2021-09-30 (×2): 50 mg via ORAL
  Filled 2021-09-28 (×3): qty 1
  Filled 2021-09-28: qty 0.5

## 2021-09-28 MED ORDER — METHYLPREDNISOLONE SODIUM SUCC 40 MG IJ SOLR
40.0000 mg | Freq: Two times a day (BID) | INTRAMUSCULAR | Status: DC
  Administered 2021-09-28 – 2021-09-30 (×5): 40 mg via INTRAVENOUS
  Filled 2021-09-28 (×5): qty 1

## 2021-09-28 MED ORDER — DEXAMETHASONE 6 MG PO TABS
6.0000 mg | ORAL_TABLET | Freq: Every day | ORAL | Status: DC
  Filled 2021-09-28: qty 1

## 2021-09-28 MED ORDER — PANTOPRAZOLE SODIUM 40 MG IV SOLR
40.0000 mg | Freq: Two times a day (BID) | INTRAVENOUS | Status: DC
  Administered 2021-09-28 – 2021-09-29 (×3): 40 mg via INTRAVENOUS
  Filled 2021-09-28 (×3): qty 40

## 2021-09-28 NOTE — ED Notes (Addendum)
Patient resting comfortably on stretcher on right side with eyes closed. RR even and unlabored. Patient in no obvious pain or distress at this time. Patient is nonverbal at baseline. Patient's daughter sleeping in chair at bedside. This nurse checked the patient's brief to see if it needed to be changed and it did not at this time. Finger splints have been placed at bedside so that when patient awakens, one can be placed on right hand, fifth digit.

## 2021-09-28 NOTE — Progress Notes (Signed)
Cross Cover Nurse reports dark stool, heme +. Placed patient on every 12 hour protonix.  Serial H & H..  May need GI eval

## 2021-09-28 NOTE — ED Notes (Signed)
Patient resting comfortably on stretcher. RR even and unlabored. Patient does not appear to be in any acute pain or distress at this time. Patient is nonverbal at baseline per daughter.

## 2021-09-28 NOTE — ED Notes (Signed)
Patient resting comfortably on stretcher with eyes closed. RR even and unlabored. Patient does not appear to be in any acute pain or distress at this time. Patient is nonverbal at baseline per daughter. Patient's daughter is at bedside sleeping in chair. Spoke to her about going home. Order for splint has been given and will be done when patient is awake.

## 2021-09-28 NOTE — Progress Notes (Signed)
OT Cancellation Note  Patient Details Name: Crystal Faulkner MRN: [id number] DOB: 02/13/1925   Cancelled Treatment:    Reason Eval/Treat Not Completed: Patient at procedure or test/ unavailable Pt OTF to XR at this time. Will f/u at later date/time as able for nursing.   Gerrianne Scale, [place], OTR/L ascom [phone] 09/28/21, 4:53 PM

## 2021-09-28 NOTE — ED Notes (Signed)
Antibiotics being changed. Encouraged family to sit at patient's bedside to keep her arm straight so that her infusions complete in a timely manner. Patient resting comfortably on stretcher. RR even and unlabored. Patient does not appear to be in any acute pain or distress at this time. Patient is nonverbal at baseline per daughter. Patient's daughter is at bedside sleeping in chair.

## 2021-09-28 NOTE — ED Notes (Signed)
Patient resting comfortably on stretcher with eyes closed. RR even and unlabored. Patient does not appear to be in any acute pain or distress at this time. Patient is nonverbal at baseline per daughter. Patient's daughter is at bedside sleeping in chair. Spoke to her about going home. Patient's Remdesivir continues to run as she does not want to keep her arm straight.

## 2021-09-28 NOTE — ED Notes (Signed)
Informed RN bed assigned 

## 2021-09-28 NOTE — ED Notes (Signed)
Patient noted to have wet her brief and a dark stool was present. Specimen was placed on hemocult card and found to be positive for blood. Will message provider. Patient also given crushed Synthroid with applesauce.

## 2021-09-28 NOTE — Evaluation (Addendum)
Clinical/Bedside Swallow Evaluation Patient Details  Name: Crystal Faulkner MRN: [id number] Date of Birth: 1924/11/05  Today's Date: 09/28/2021 Time: SLP Start Time (ACUTE ONLY): 45 SLP Stop Time (ACUTE ONLY): 70 SLP Time Calculation (min) (ACUTE ONLY): 50 min  Past Medical History:  Past Medical History:  Diagnosis Date   Allergy    Alzheimer's disease ([place])    Anemia    CCF (congestive cardiac failure) (HCC)    Chronic kidney disease    Chronic stable angina (HCC)    Frequent falls    Gout    H/O: GI bleed    Hypercholesteremia    Hyperglycemia    Hyperlipidemia    Hypertension    Incontinence    Mixed dementia ([place])    Myocardial infarction ([place])    Numbness of right hand    Osteoporosis    Right hand weakness    Stroke ([place])    Thyroid disease    Vitamin D deficiency    Past Surgical History:  Past Surgical History:  Procedure Laterality Date   EXPLORATORY LAPAROTOMY     EYE SURGERY Bilateral    cataract   HPI:  Pt is a 88 y.o. 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  Patient was seen in the emergency department 5 days ago and was diagnosed with COVID.  Since being home, she has had progressive weakness loss of appetite and worsening mental status -- Baseline Dementia per chart history.  She has been recommended at MINCED foods diet at previous admit(07/2021).  Patient at baseline is aphasic but is normally awake and can interact somewhat.  She has been much more tired and today was dozing off.  Has also had a fall today onto the right arm, unclear if hit head.  Patient is unable to provide history.  History obtained from patient's daughter and granddaughter.  Has had black stools but this is baseline for her since she is on iron supplements.  She lives at home  where Family take care of her.    Assessment / Plan / Recommendation  Clinical Impression   Pt appears to present w/ oral phase dysphagia d/t impact of Cognitive status; pt has a baseline dx of Alzheimer's Disease, Dementia, per chart. Suspect this, and the Acuity of illness currently, are impacting her overall awareness/engagement w/ po tasks which increases risk for aspiration. Pt's risk for aspiration is present but reduced when following aspiration precautions, monitoring for oral clearing, and when using a modified diet of PUREED foods w/ thin liquids. She requires mod-max oral, tactile/verbal cues for orientation to bolus presentation; and feeding support.   Pt has a recent h/o Choking per chart/ED notes: "patient had a choking episode while eating a banana without her teeth in.". Noted pt has had multiple visits to the ED since October 2022.   Pt consumed trials of purees and straw sips of thin liquids w/ No overt clinical s/s of aspiration noted; no decline in respiratory status and no cough during/post trials -- pt is nonverbal so unable to assess vocal quality. Oral phase was grossly [facility] for bolus management and oral clearing of the thin liquid trials given. However, w/ pureed boluses, pt exhibited delayed oral A-P transfer and oral holding. She required increased Time and cues along w/ alternating small sips of thin liquids to aid transfer/swallow/clearing.  Daughters(on  phone too) reported pt is "usually" able to eat chopped foods that they, and other family members who care for pt, feed pt at home. Family currently chops the food at home, and pt is able to eat it w/ her Dentures in place. Family gives pt Ensure at home. Discussed diet consistency of foods and recommendation for PUREED foods at this time during Acuity of illness. Also discussed overall oral intake and aspiration precautions w/ this diet; pills Crushed in puree; feeding support and supervision.    Recommend a Dysphagia level  1(puree) w/ thin liquids to support oral intake -- most beneficial diet consistency at this time of illness w/ Baseline Cognitive decline; aspiration precautions; reduce Distractions during meals. Pills Crushed in Puree for safer swallowing. Support w/ feeding at meals -- check for oral clearing during/post intake. Recommend Palliative Care f/u for support of [place]. NSG/MD updated. NSG to reconsult ST services if new needs arise while admitted. ST will monitor for any further education needs.  SLP Visit Diagnosis: Dysphagia, oral phase (R13.11) (baseline Cognitive decline/Dementia)    Aspiration Risk  Mild aspiration risk;Risk for inadequate nutrition/hydration (reduced when following aspiration precautions, pureed diet)    Diet Recommendation   Dysphagia level 1(puree) w/ thin liquids to support oral intake -- most beneficial diet consistency at this time of illness w/ Baseline Cognitive decline; aspiration precautions; reduce Distractions during meals. Support w/ feeding at meals -- check for oral clearing during/post intake. Alternate foods/liquids during meals.  Medication Administration: Crushed with puree    Other  Recommendations Recommended Consults:  (Dietician f/u; Palliative Care f/u for support) Oral Care Recommendations: Oral care BID;Oral care before and after PO;Staff/trained caregiver to provide oral care Other Recommendations:  (n/a)    Recommendations for follow up therapy are one component of a multi-disciplinary discharge planning process, led by the attending physician.  Recommendations may be updated based on patient status, additional functional criteria and insurance authorization.  Follow up Recommendations No SLP follow up (TBD)      Assistance Recommended at Discharge Frequent or constant Supervision/Assistance  Functional Status Assessment Patient has had a recent decline in their functional status and/or demonstrates limited ability to make significant improvements in  function in a reasonable and predictable amount of time  Frequency and Duration min 1 x/week  1 week       Prognosis Prognosis for Safe Diet Advancement: Fair Barriers to Reach Goals: Cognitive deficits;Language deficits;Time post onset;Severity of deficits;Behavior Barriers/Prognosis Comment: can attempt return to baseline minced foods diet when post Acuity of illness/hospitalization      Swallow Study   General Date of Onset: 09/27/21 HPI: Pt is a 88 y.o. 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  Patient was seen in the emergency department 5 days ago and was diagnosed with COVID.  Since being home, she has had progressive weakness loss of appetite and worsening mental status -- Baseline Dementia per chart history.  She has been recommended at MINCED foods diet at previous admit.  Patient at baseline is aphasic but is normally awake and can interact somewhat.  She has been much more tired and today was dozing off.  Has also had a fall today onto the right arm, unclear if hit head.  Patient is unable to provide history.  History obtained from patient's daughter and granddaughter.  Has had  black stools but this is baseline for her since she is on iron supplements.  She lives at home where Family take care of her. Type of Study: Bedside Swallow Evaluation Previous Swallow Assessment: 07/2021; 2018 Diet Prior to this Study: Dysphagia 2 (chopped);Thin liquids Temperature Spikes Noted: No (wbc 6.3) Respiratory Status: Nasal cannula (2L) History of Recent Intubation: No Behavior/Cognition: Alert;Cooperative;Pleasant mood;Confused;Requires cueing (nonverbal baseline) Oral Cavity Assessment:  (CNT) Oral Care Completed by SLP: Yes Oral Cavity - Dentition: Edentulous (has Dentures but were not  placed/recommended) Vision:  (n/a) Self-Feeding Abilities: Total assist Patient Positioning: Upright in bed;Postural control adequate for testing (much support) Baseline Vocal Quality:  (nonverbal) Volitional Cough: Cognitively unable to elicit Volitional Swallow: Unable to elicit    Oral/Motor/Sensory Function Overall Oral Motor/Sensory Function: Generalized oral weakness   Ice Chips Ice chips: Within functional limits Presentation: Spoon (fed; 3 trials)   Thin Liquid Thin Liquid: Within functional limits Presentation: Straw (12+ trials) Other Comments: some confusion w/ straw initially    Nectar Thick Nectar Thick Liquid: Not tested   Honey Thick Honey Thick Liquid: Not tested   Puree Puree: Impaired Presentation: Spoon (fed; 6 trials) Oral Phase Impairments: Poor awareness of bolus Oral Phase Functional Implications: Prolonged oral transit;Oral holding Pharyngeal Phase Impairments:  (none) Other Comments: alternated sips/bites to aid oral clearing/swallowing   Solid     Solid: Not tested Other Comments: not recommended d/t degree of cognitive decline        Orinda Kenner, MS, CCC-SLP Speech Language Pathologist Rehab Services; [place] [phone] (ascom)  Maleeah Crossman 09/28/2021,5:55 PM

## 2021-09-28 NOTE — ED Notes (Signed)
Pt resting in NAD. Daughter at bedside. Denies further needs at this time.

## 2021-09-28 NOTE — ED Notes (Signed)
Patient resting comfortably on stretcher on right side with eyes closed. RR even and unlabored. Patient in no obvious pain or distress at this time. Patient is nonverbal at baseline. Patient's daughter sleeping in chair at bedside. Patient's Remdesivir continues to run after an hour and a half because patient will not keep her arm straight for infusion.

## 2021-09-28 NOTE — Progress Notes (Signed)
PHARMACY - PHYSICIAN COMMUNICATION CRITICAL VALUE ALERT - BLOOD CULTURE IDENTIFICATION (BCID)  Crystal Faulkner is an 88 y.o. female who presented to [facility] on 09/27/2021 with a chief complaint of covid diagnosis on Monday and since then pt has been declining with mentation and generalized weakness and worsening mental status and somnolent.  Assessment:  BCID: + for Staph species w/o resistance noted (include suspected source if known) proable contaminant.  Name of physician (or Provider) Contacted: patel, Ekta  Current antibiotics: cefepime 1gm q24hrs  Changes to prescribed antibiotics recommended:  Response not received from provider;  current antibiotics are likely to cover the isolated organism.  Consider de-escalation soon.  Results for orders placed or performed during the hospital encounter of 09/27/21  Blood Culture ID Panel (Reflexed) (Collected: 09/27/2021  7:33 PM)  Result Value Ref Range   Enterococcus faecalis NOT DETECTED NOT DETECTED   Enterococcus Faecium NOT DETECTED NOT DETECTED   Listeria monocytogenes NOT DETECTED NOT DETECTED   Staphylococcus species DETECTED (A) NOT DETECTED   Staphylococcus aureus (BCID) NOT DETECTED NOT DETECTED   Staphylococcus epidermidis NOT DETECTED NOT DETECTED   Staphylococcus lugdunensis NOT DETECTED NOT DETECTED   Streptococcus species NOT DETECTED NOT DETECTED   Streptococcus agalactiae NOT DETECTED NOT DETECTED   Streptococcus pneumoniae NOT DETECTED NOT DETECTED   Streptococcus pyogenes NOT DETECTED NOT DETECTED   A.calcoaceticus-baumannii NOT DETECTED NOT DETECTED   Bacteroides fragilis NOT DETECTED NOT DETECTED   Enterobacterales NOT DETECTED NOT DETECTED   Enterobacter cloacae complex NOT DETECTED NOT DETECTED   Escherichia coli NOT DETECTED NOT DETECTED   Klebsiella aerogenes NOT DETECTED NOT DETECTED   Klebsiella oxytoca NOT DETECTED NOT DETECTED   Klebsiella pneumoniae NOT DETECTED NOT DETECTED   Proteus species NOT  DETECTED NOT DETECTED   Salmonella species NOT DETECTED NOT DETECTED   Serratia marcescens NOT DETECTED NOT DETECTED   Haemophilus influenzae NOT DETECTED NOT DETECTED   Neisseria meningitidis NOT DETECTED NOT DETECTED   Pseudomonas aeruginosa NOT DETECTED NOT DETECTED   Stenotrophomonas maltophilia NOT DETECTED NOT DETECTED   Candida albicans NOT DETECTED NOT DETECTED   Candida auris NOT DETECTED NOT DETECTED   Candida glabrata NOT DETECTED NOT DETECTED   Candida krusei NOT DETECTED NOT DETECTED   Candida parapsilosis NOT DETECTED NOT DETECTED   Candida tropicalis NOT DETECTED NOT DETECTED   Cryptococcus neoformans/gattii NOT DETECTED NOT DETECTED    Berta Minor 09/28/2021  2:54 PM

## 2021-09-28 NOTE — Progress Notes (Addendum)
PROGRESS NOTE    Crystal Faulkner  NTI:[id number] DOB: 10/25/1924 DOA: 09/27/2021 PCP: Steele Sizer, MD  Outpatient Specialists: nephrology, cardiology    Brief Narrative:   From admission h and p Crystal Faulkner is a 88 y.o. female presenting with AMS and recent covid diagnosis on Monday and since then pt has been declining with mentation and generalized weakness and worsening mental status and somnolent. HPI is difficult as pt is aphasic and cant give history. Pt was noted to have fall today.   Assessment & Plan:   Principal Problem:   Pneumonia Active Problems:   Coronary artery disease   Benign hypertension   Adult hypothyroidism   Acute kidney injury superimposed on chronic kidney disease (HCC)   AMS (altered mental status)   History of CVA in adulthood   # COVID Pneumonia 1 week of symptoms. Family says tested positive 1/30. Procal is markedly elevated and there are right middle lung opacities on cxr - cont remdesevir - start steroids - [place] O2 - slp swallow eval - cont cefepime, stop vanc but check mrsa screen. Cefepime started 2/3 - sputum for culture if can produce - f/u blood cultures - f/u urine antigens  # Acute hypoxic respiratory failure 2/2 CAP/covid pneumonia. O2 80s on arrival. No respiratory distress - [place] o2, wean as able.  # Fall X ray of rigth hand/forearm shows non-acute ulnar styloid fracture, possible 5th phalanx fracture. Also with bruising right leg and left shoulder - will check x rays of hip and right leg  and left shoulder then ask ortho to evaluate  # HTN  # CKD5 Followed by nephro. Has declined dialysis. Here no sig acid/base disturbance, k wnl, kidney function appears at baseline, is not volume overloaded - monitor  # Anemia of chronic kidney disease Hgb 7.4, baseline appears to be around 8. Has dark stool but takes iron so stool is chronically dark. Hemoccult positive in the ed. Has remote history of GIB. No hematochezia or  hematemesis - will monitor hgb for now - home sodium bicarb - cont IV PPI for now  # Gout - home allopurinol  # CAD S/p stent. - cont home plavix  # History CVA CT without acute findings - cont home plavix  # HFpEF Compensated - cont home lasix, imdur  # Hypothyroid - home levothyroxine   DVT prophylaxis: heparin Code Status: DNR Family Communication: daughter updated @ bedside  Level of care: Progressive Status is: Inpatient Remains inpatient appropriate because: severity of illness            Consultants:  none  Procedures: none  Antimicrobials:  Vanc/cefepime>cefepime    Subjective: No acute distress  Objective: Vitals:   09/27/21 1845 09/27/21 1846 09/27/21 2106 09/28/21 0730  BP: 125/73   (!) 146/78  Pulse: 78   86  Resp: 16   18  Temp:   97.6 F (36.4 C)   SpO2: 100%   100%  Weight:  52 kg    Height:  5' (1.524 m)      Intake/Output Summary (Last 24 hours) at 09/28/2021 0854 Last data filed at 09/28/2021 0350 Gross per 24 hour  Intake 1100 ml  Output --  Net 1100 ml   Filed Weights   09/27/21 1846  Weight: 52 kg    Examination:  General exam: Appears calm and comfortable  Respiratory system: ralesa t bases and on right Cardiovascular system: S1 & S2 heard, RRR. Soft systolic murmur Gastrointestinal system: Abdomen is nondistended, soft  and nontender. No organomegaly or masses felt. Normal bowel sounds heard. Central nervous system: right sided weakness Extremities: right sided contractures. Decreased muscle tone throughout Skin: bruising left shoulder, right leg, skin tears Psychiatry: unable to assess    Data Reviewed: I have personally reviewed following labs and imaging studies  CBC: Recent Labs  Lab 09/23/21 1830 09/27/21 1844 09/28/21 0728  WBC 7.9 7.4 6.3  NEUTROABS 5.7 5.4  --   HGB 8.9* 8.2* 7.4*  HCT 29.6* 27.2* 24.4*  MCV 103.9* 102.6* 103.0*  PLT 164 182 562   Basic Metabolic Panel: Recent Labs   Lab 09/23/21 1830 09/27/21 1844 09/28/21 0728  NA 141 143 143  K 4.1 4.8 4.3  CL 108 112* 115*  CO2 23 22 17*  GLUCOSE 92 97 81  BUN 59* 66* 60*  CREATININE 4.03* 4.39* 4.08*  CALCIUM 8.6* 8.0* 7.8*   GFR: Estimated Creatinine Clearance: 6.1 mL/min (A) (by C-G formula based on SCr of 4.08 mg/dL (H)). Liver Function Tests: Recent Labs  Lab 09/23/21 1830 09/27/21 1844  AST 52* 37  ALT 25 19  ALKPHOS 52 48  BILITOT 0.3 0.4  PROT 6.1* 6.1*  ALBUMIN 2.7* 2.4*   No results for input(s): LIPASE, AMYLASE in the last 168 hours. No results for input(s): AMMONIA in the last 168 hours. Coagulation Profile: Recent Labs  Lab 09/27/21 1844  INR 1.2   Cardiac Enzymes: No results for input(s): CKTOTAL, CKMB, CKMBINDEX, TROPONINI in the last 168 hours. BNP (last 3 results) No results for input(s): PROBNP in the last 8760 hours. HbA1C: No results for input(s): HGBA1C in the last 72 hours. CBG: No results for input(s): GLUCAP in the last 168 hours. Lipid Profile: No results for input(s): CHOL, HDL, LDLCALC, TRIG, CHOLHDL, LDLDIRECT in the last 72 hours. Thyroid Function Tests: No results for input(s): TSH, T4TOTAL, FREET4, T3FREE, THYROIDAB in the last 72 hours. Anemia Panel: No results for input(s): VITAMINB12, FOLATE, FERRITIN, TIBC, IRON, RETICCTPCT in the last 72 hours. Urine analysis:    Component Value Date/Time   COLORURINE YELLOW (A) 06/18/2021 2007   APPEARANCEUR CLOUDY (A) 06/18/2021 2007   LABSPEC 1.005 06/18/2021 2007   PHURINE 5.0 06/18/2021 2007   GLUCOSEU NEGATIVE 06/18/2021 2007   HGBUR NEGATIVE 06/18/2021 2007   BILIRUBINUR NEGATIVE 06/18/2021 2007   BILIRUBINUR negative 01/27/2017 [place] 06/18/2021 2007   PROTEINUR NEGATIVE 06/18/2021 2007   UROBILINOGEN negative (A) 01/27/2017 1146   NITRITE POSITIVE (A) 06/18/2021 2007   LEUKOCYTESUR SMALL (A) 06/18/2021 2007   Sepsis Labs: @LABRCNTIP (procalcitonin:4,lacticidven:4)  ) Recent  Results (from the past 240 hour(s))  Resp Panel by RT-PCR (Flu A&B, Covid) Nasopharyngeal Swab     Status: Abnormal   Collection Time: 09/27/21  7:33 PM   Specimen: Nasopharyngeal Swab; Nasopharyngeal(NP) swabs in vial transport medium  Result Value Ref Range Status   SARS Coronavirus 2 by RT PCR POSITIVE (A) NEGATIVE Final    Comment: (NOTE) SARS-CoV-2 target nucleic acids are DETECTED.  The SARS-CoV-2 RNA is generally detectable in upper respiratory specimens during the acute phase of infection. Positive results are indicative of the presence of the identified virus, but do not rule out bacterial infection or co-infection with other pathogens not detected by the test. Clinical correlation with patient history and other diagnostic information is necessary to determine patient infection status. The expected result is Negative.  Fact Sheet for Patients: EntrepreneurPulse.com.au  Fact Sheet for Healthcare Providers: IncredibleEmployment.be  This test is not yet approved or  cleared by the [place] and  has been authorized for detection and/or diagnosis of SARS-CoV-2 by FDA under an Emergency Use Authorization (EUA).  This EUA will remain in effect (meaning this test can be used) for the duration of  the COVID-19 declaration under Section 564(b)(1) of the A ct, 21 U.S.C. section 360bbb-3(b)(1), unless the authorization is terminated or revoked sooner.     Influenza A by PCR NEGATIVE NEGATIVE Final   Influenza B by PCR NEGATIVE NEGATIVE Final    Comment: (NOTE) The Xpert Xpress SARS-CoV-2/FLU/RSV plus assay is intended as an aid in the diagnosis of influenza from Nasopharyngeal swab specimens and should not be used as a sole basis for treatment. Nasal washings and aspirates are unacceptable for Xpert Xpress SARS-CoV-2/FLU/RSV testing.  Fact Sheet for Patients: EntrepreneurPulse.com.au  Fact Sheet for Healthcare  Providers: IncredibleEmployment.be  This test is not yet approved or cleared by the [place] FDA and has been authorized for detection and/or diagnosis of SARS-CoV-2 by FDA under an Emergency Use Authorization (EUA). This EUA will remain in effect (meaning this test can be used) for the duration of the COVID-19 declaration under Section 564(b)(1) of the Act, 21 U.S.C. section 360bbb-3(b)(1), unless the authorization is terminated or revoked.  Performed at [facility], [address] [place], [place] [address]   Blood culture (routine x 2)     Status: None (Preliminary result)   Collection Time: 09/27/21  7:33 PM   Specimen: Left Antecubital; Blood  Result Value Ref Range Status   Specimen Description LEFT ANTECUBITAL  Final   Special Requests   Final    BOTTLES DRAWN AEROBIC AND ANAEROBIC Blood Culture results may not be optimal due to an inadequate volume of blood received in culture bottles   Culture   Final    NO GROWTH < 12 HOURS Performed at [facility], [address], [place], [place] [address]    Report Status PENDING  Incomplete  Blood culture (routine x 2)     Status: None (Preliminary result)   Collection Time: 09/27/21  7:33 PM   Specimen: Right Antecubital; Blood  Result Value Ref Range Status   Specimen Description RIGHT ANTECUBITAL  Final   Special Requests   Final    BOTTLES DRAWN AEROBIC AND ANAEROBIC Blood Culture results may not be optimal due to an inadequate volume of blood received in culture bottles   Culture   Final    NO GROWTH < 12 HOURS Performed at [facility], [address] [place], [place] [address]    Report Status PENDING  Incomplete         Radiology Studies: DG Forearm Right  Result Date: 09/27/2021 CLINICAL DATA:  Fall this morning.  RIGHT arm pain. EXAM: RIGHT FOREARM - 2 VIEW COMPARISON:  None. FINDINGS: Displaced fracture of the ulnar styloid process, likely chronic. Radius  and ulna appear otherwise intact and normally aligned throughout. Carpal bones appear grossly intact and normally aligned. Degenerative osteoarthritic changes noted at the scaphoid-trapezium joint space. IMPRESSION: 1. Displaced fracture of the ulnar styloid process, likely chronic. 2. No acute-appearing fracture or dislocation seen. 3. 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 Electronically Signed   By: Franki Cabot M.D.   On: 09/27/2021 20:24   DG Wrist Complete Right  Result Date: 09/27/2021 CLINICAL DATA:  Fall, pain. EXAM: RIGHT WRIST - COMPLETE 3+ VIEW COMPARISON:  None. FINDINGS: Chronic-appearing fracture of the ulnar styloid process. Distal radius and ulna appear otherwise intact  and normally aligned. Carpal bones appear intact and normally aligned. Degenerative osteoarthritic changes noted at the scaphoid-trapezium joint space, moderate in degree with associated articular surface sclerosis. Soft tissues about the RIGHT wrist are unremarkable. IMPRESSION: 1. No acute findings. 2. Chronic-appearing fracture of the ulnar styloid process. 3. 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 Electronically Signed   By: Franki Cabot M.D.   On: 09/27/2021 20:28   CT HEAD WO CONTRAST (5MM)  Result Date: 09/27/2021 CLINICAL DATA:  Mental status change, unknown cause EXAM: CT HEAD WITHOUT CONTRAST TECHNIQUE: Contiguous axial images were obtained from the base of the skull through the vertex without intravenous contrast. RADIATION DOSE REDUCTION: This exam was performed according to the departmental dose-optimization program which includes automated exposure control, adjustment of the mA and/or kV according to patient size and/or use of iterative reconstruction technique. COMPARISON:  08/07/2021 FINDINGS: Brain: Diffuse cerebral atrophy. Severe diffuse chronic small vessel disease throughout the deep white matter. Old left MCA infarct with  encephalomalacia throughout the left cerebral hemisphere. No acute intracranial abnormality. Specifically, no hemorrhage, hydrocephalus, mass lesion, acute infarction, or significant intracranial injury. Vascular: No hyperdense vessel or unexpected calcification. Skull: No acute calvarial abnormality. Sinuses/Orbits: No acute findings Other: None IMPRESSION: Atrophy, severe diffuse chronic small vessel disease. Old left MCA infarct. No acute intracranial abnormality. Electronically Signed   By: Rolm Baptise M.D.   On: 09/27/2021 20:07   CT Cervical Spine Wo Contrast  Result Date: 09/27/2021 CLINICAL DATA:  Neck trauma (Age >= 65y). EXAM: CT CERVICAL SPINE WITHOUT CONTRAST TECHNIQUE: Multidetector CT imaging of the cervical spine was performed without intravenous contrast. Multiplanar CT image reconstructions were also generated. RADIATION DOSE REDUCTION: This exam was performed according to the departmental dose-optimization program which includes automated exposure control, adjustment of the mA and/or kV according to patient size and/or use of iterative reconstruction technique. COMPARISON:  11/04/2017 FINDINGS: Alignment: No subluxation. Skull base and vertebrae: No acute fracture. No primary bone lesion or focal pathologic process. Soft tissues and spinal canal: No prevertebral fluid or swelling. No visible canal hematoma. Disc levels:  Diffuse degenerative disc disease and facet disease. Upper chest: No acute abnormality. Other: None IMPRESSION: Degenerative disc and facet disease.  No acute bony abnormality. Electronically Signed   By: Rolm Baptise M.D.   On: 09/27/2021 20:10   DG Chest Portable 1 View  Result Date: 09/27/2021 CLINICAL DATA:  Shortness of breath.  Recently diagnosed with COVID. EXAM: PORTABLE CHEST 1 VIEW COMPARISON:  Chest x-ray dated 09/23/2021. FINDINGS: New patchy airspace opacities within the RIGHT mid lung region. No pleural effusion or pneumothorax is seen. Heart size and  mediastinal contours are stable. Again noted is a large hiatal hernia. IMPRESSION: 1. New patchy airspace opacities within the RIGHT mid lung region, consistent with pneumonia, presumably COVID pneumonia. 2. Large hiatal hernia. Electronically Signed   By: Franki Cabot M.D.   On: 09/27/2021 20:22   DG Humerus Right  Result Date: 09/27/2021 CLINICAL DATA:  Fall, pain. EXAM: RIGHT HUMERUS - 2+ VIEW COMPARISON:  None. FINDINGS: Osseous alignment is normal. No fracture line or displaced fracture fragment is seen. Adjacent soft tissues are unremarkable. IMPRESSION: Negative. Electronically Signed   By: Franki Cabot M.D.   On: 09/27/2021 20:27   DG Hand Complete Right  Result Date: 09/27/2021 CLINICAL DATA:  Fall, pain. EXAM: RIGHT HAND - COMPLETE 3+ VIEW COMPARISON:  None. FINDINGS: Displaced fracture of the ulnar styloid process, likely chronic. Distal radius and ulna  appear otherwise intact and normally aligned. Carpal bones appear intact. Questionable nondisplaced fracture at the base of the fifth middle phalanx. Degenerative osteoarthritic changes noted at the scaphoid-trapezium joint space, moderate in degree with articular surface sclerosis. Interphalangeal joint spaces are relatively well maintained. IMPRESSION: 1. Questionable nondisplaced fracture at the base of the fifth middle phalanx. 2. Displaced fracture of the ulnar styloid process, likely chronic. 3. 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 Electronically Signed   By: Franki Cabot M.D.   On: 09/27/2021 20:26        Scheduled Meds:  [START ON 09/30/2021] calcitRIOL  0.25 mcg Oral Q M,W,F   clopidogrel  75 mg Oral Daily   dexamethasone  6 mg Oral Daily   docusate sodium  100 mg Oral BID   ferrous sulfate  325 mg Oral BID WC   furosemide  20 mg Oral BID   heparin  5,000 Units Subcutaneous Q8H   isosorbide mononitrate  60 mg Oral Daily   levothyroxine  50 mcg Oral Q0600   pantoprazole  (PROTONIX) IV  40 mg Intravenous Q12H   raloxifene  60 mg Oral Daily   rosuvastatin  10 mg Oral Daily   sodium bicarbonate  650 mg Oral Daily   Continuous Infusions:  sodium chloride 50 mL/hr at 09/28/21 0500   ceFEPime (MAXIPIME) IV     remdesivir 100 mg in NS 100 mL       LOS: 1 day    Time spent: 13 min    Desma Maxim, MD [facility]   If 7PM-7AM, please contact night-coverage [email or web address] Password [facility] 09/28/2021, 8:54 AM

## 2021-09-29 ENCOUNTER — Inpatient Hospital Stay
Admit: 2021-09-29 | Discharge: 2021-09-29 | Disposition: A | Discharge status: Home / Self Care | Payer: Medicare HMO | Attending: Obstetrics and Gynecology | Admitting: Obstetrics and Gynecology

## 2021-09-29 DIAGNOSIS — U071 COVID-19: Secondary | ICD-10-CM | POA: Diagnosis not present

## 2021-09-29 DIAGNOSIS — J1282 Pneumonia due to coronavirus disease 2019: Secondary | ICD-10-CM | POA: Diagnosis not present

## 2021-09-29 LAB — BASIC METABOLIC PANEL
Anion gap: 9 (ref 5–15)
BUN: 66 mg/dL — ABNORMAL HIGH (ref 8–23)
CO2: 18 mmol/L — ABNORMAL LOW (ref 22–32)
Calcium: 8.1 mg/dL — ABNORMAL LOW (ref 8.9–10.3)
Chloride: 119 mmol/L — ABNORMAL HIGH (ref 98–111)
Creatinine, Ser: 4.17 mg/dL — ABNORMAL HIGH (ref 0.44–1.00)
GFR, Estimated: 9 mL/min — ABNORMAL LOW (ref >60)
Glucose, Bld: 157 mg/dL — ABNORMAL HIGH (ref 70–99)
Potassium: 4.7 mmol/L (ref 3.5–5.1)
Sodium: 146 mmol/L — ABNORMAL HIGH (ref 135–145)

## 2021-09-29 LAB — CBC
HCT: 25.3 % — ABNORMAL LOW (ref 36.0–46.0)
Hemoglobin: 7.8 g/dL — ABNORMAL LOW (ref 12.0–15.0)
MCH: 30.4 pg (ref 26.0–34.0)
MCHC: 30.8 g/dL (ref 30.0–36.0)
MCV: 98.4 fL (ref 80.0–100.0)
Platelets: 219 10*3/uL (ref 150–400)
RBC: 2.57 MIL/uL — ABNORMAL LOW (ref 3.87–5.11)
RDW: 15.3 % (ref 11.5–15.5)
WBC: 8.1 10*3/uL (ref 4.0–10.5)
nRBC: 0 % (ref 0.0–0.2)

## 2021-09-29 LAB — HEMOGLOBIN AND HEMATOCRIT, BLOOD
HCT: 26.5 % — ABNORMAL LOW (ref 36.0–46.0)
Hemoglobin: 8.2 g/dL — ABNORMAL LOW (ref 12.0–15.0)

## 2021-09-29 MED ORDER — SODIUM CHLORIDE 0.9 % IV SOLN: INTRAVENOUS | Status: DC

## 2021-09-29 NOTE — Progress Notes (Addendum)
PROGRESS NOTE    Crystal Faulkner  ALP:[id number] DOB: [birth year] DOA: 09/27/2021 PCP: Steele Sizer, MD  Outpatient Specialists: nephrology, cardiology    Brief Narrative:   From admission h and p Crystal Faulkner is a 88 y.o. female presenting with AMS and recent covid diagnosis on Monday and since then pt has been declining with mentation and generalized weakness and worsening mental status and somnolent. HPI is difficult as pt is aphasic and cant give history. Pt was noted to have fall today.   Assessment & Plan:   Principal Problem:   Pneumonia Active Problems:   Coronary artery disease   Benign hypertension   Adult hypothyroidism   Acute kidney injury superimposed on chronic kidney disease (HCC)   AMS (altered mental status)   History of CVA in adulthood   # COVID Pneumonia 1 week of symptoms. Family says tested positive 1/30. Procal is markedly elevated and there are right middle lung opacities on cxr. Mrsa pcr neg - cont remdesevir - start steroids - [place] O2 - slp swallow eval - cont cefepime - sputum for culture if can produce - f/u urine antigens  # Staph bacteremia From 2 sites. Pharmacy says no resistance noted, cefepime should culture - cont cefepime for now - TTE ordered - repeat blood cultures ordered - ID consult tomorrow  # Acute hypoxic respiratory failure 2/2 CAP/covid pneumonia. O2 80s on arrival. No respiratory distress - [place] o2, wean as able.  # Hypernatremia 2/2 not eating/drinking - start gentle fluids - advised daughter if patient persists in not eating/drinking this would be sign of end of life - SLP has cleared for dysphagia diet, is significant aspiration risk  # Fall X ray of rigth hand/forearm shows non-acute ulnar styloid fracture, possible 5th phalanx fracture. Also with bruising right leg and left shoulder. Remainder of x-rays negative. Asked Dr. Roland Rack of ortho review, he said these all represent chronic problems, no indication for  acute treatment  # HTN  # CKD5 Followed by nephro. Has declined dialysis. Here no sig acid/base disturbance, k wnl, kidney function appears at baseline, is not volume overloaded - monitor  # Anemia of chronic kidney disease Hgb ~8 baseline appears to be around 8. Has dark stool but takes iron so stool is chronically dark. Hemoccult positive in the ed. Has remote history of GIB. No hematochezia or hematemesis - will monitor hgb for now  # Gout - home allopurinol  # CAD S/p stent. - cont home plavix  # History CVA CT without acute findings - cont home plavix  # HFpEF Compensated - cont home imdur - lasix on hold given not taking PO  # Hypothyroid - home levothyroxine   DVT prophylaxis: heparin Code Status: DNR Family Communication: daughter updated @ bedside 2/5  Level of care: Progressive Status is: Inpatient Remains inpatient appropriate because: severity of illness      Consultants:  none  Procedures: none  Antimicrobials:  Vanc/cefepime>cefepime    Subjective: No acute distress. Not eating/drinking  Objective: Vitals:   09/29/21 0040 09/29/21 0542 09/29/21 0849 09/29/21 1227  BP: 113/72 140/70 128/63 (!) 148/86  Pulse: 80 84 79 80  Resp: 17 16 16 18   Temp: 98.5 F (36.9 C) 97.8 F (36.6 C) (!) 97.4 F (36.3 C) 98 F (36.7 C)  TempSrc: Axillary Oral Oral   SpO2: 100% 98% 99% 100%  Weight:      Height:        Intake/Output Summary (Last 24 hours) at 09/29/2021  [place] filed at 09/28/2021 2252 Gross per 24 hour  Intake 749.17 ml  Output --  Net 749.17 ml   Filed Weights   09/27/21 1846  Weight: 52 kg    Examination:  General exam: Appears calm and comfortable  Respiratory system: rales at bases and on right Cardiovascular system: S1 & S2 heard, RRR. Soft systolic murmur Gastrointestinal system: Abdomen is nondistended, soft and nontender. No organomegaly or masses felt. Normal bowel sounds heard. Central nervous system:  right sided weakness Extremities: right sided contractures. Decreased muscle tone throughout Skin: bruising left shoulder, right leg, skin tears Psychiatry: unable to assess    Data Reviewed: I have personally reviewed following labs and imaging studies  CBC: Recent Labs  Lab 09/23/21 1830 09/27/21 1844 09/28/21 0728 09/28/21 1453 09/28/21 1828 09/29/21 0101 09/29/21 0633  WBC 7.9 7.4 6.3  --   --  8.1  --   NEUTROABS 5.7 5.4  --   --   --   --   --   HGB 8.9* 8.2* 7.4* 8.2* 8.5* 7.8* 8.2*  HCT 29.6* 27.2* 24.4* 27.3* 27.8* 25.3* 26.5*  MCV 103.9* 102.6* 103.0*  --   --  98.4  --   PLT 164 182 190  --   --  219  --    Basic Metabolic Panel: Recent Labs  Lab 09/23/21 1830 09/27/21 1844 09/28/21 0728 09/29/21 0101  NA 141 143 143 146*  K 4.1 4.8 4.3 4.7  CL 108 112* 115* 119*  CO2 23 22 17* 18*  GLUCOSE 92 97 81 157*  BUN 59* 66* 60* 66*  CREATININE 4.03* 4.39* 4.08* 4.17*  CALCIUM 8.6* 8.0* 7.8* 8.1*   GFR: Estimated Creatinine Clearance: 5.9 mL/min (A) (by C-G formula based on SCr of 4.17 mg/dL (H)). Liver Function Tests: Recent Labs  Lab 09/23/21 1830 09/27/21 1844  AST 52* 37  ALT 25 19  ALKPHOS 52 48  BILITOT 0.3 0.4  PROT 6.1* 6.1*  ALBUMIN 2.7* 2.4*   No results for input(s): LIPASE, AMYLASE in the last 168 hours. No results for input(s): AMMONIA in the last 168 hours. Coagulation Profile: Recent Labs  Lab 09/27/21 1844  INR 1.2   Cardiac Enzymes: No results for input(s): CKTOTAL, CKMB, CKMBINDEX, TROPONINI in the last 168 hours. BNP (last 3 results) No results for input(s): PROBNP in the last 8760 hours. HbA1C: No results for input(s): HGBA1C in the last 72 hours. CBG: No results for input(s): GLUCAP in the last 168 hours. Lipid Profile: No results for input(s): CHOL, HDL, LDLCALC, TRIG, CHOLHDL, LDLDIRECT in the last 72 hours. Thyroid Function Tests: No results for input(s): TSH, T4TOTAL, FREET4, T3FREE, THYROIDAB in the last 72  hours. Anemia Panel: No results for input(s): VITAMINB12, FOLATE, FERRITIN, TIBC, IRON, RETICCTPCT in the last 72 hours. Urine analysis:    Component Value Date/Time   COLORURINE YELLOW 09/27/2021 2047   APPEARANCEUR CLEAR 09/27/2021 2047   LABSPEC 1.010 09/27/2021 2047   PHURINE 5.5 09/27/2021 2047   GLUCOSEU NEGATIVE 09/27/2021 2047   HGBUR TRACE (A) 09/27/2021 2047   BILIRUBINUR NEGATIVE 09/27/2021 2047   BILIRUBINUR negative 01/27/2017 1146   KETONESUR NEGATIVE 09/27/2021 2047   PROTEINUR NEGATIVE 09/27/2021 2047   UROBILINOGEN negative (A) 01/27/2017 1146   NITRITE NEGATIVE 09/27/2021 2047   LEUKOCYTESUR MODERATE (A) 09/27/2021 2047   Sepsis Labs: @LABRCNTIP (procalcitonin:4,lacticidven:4)  ) Recent Results (from the past 240 hour(s))  Resp Panel by RT-PCR (Flu A&B, Covid) Nasopharyngeal Swab     Status:  Abnormal   Collection Time: 09/27/21  7:33 PM   Specimen: Nasopharyngeal Swab; Nasopharyngeal(NP) swabs in vial transport medium  Result Value Ref Range Status   SARS Coronavirus 2 by RT PCR POSITIVE (A) NEGATIVE Final    Comment: (NOTE) SARS-CoV-2 target nucleic acids are DETECTED.  The SARS-CoV-2 RNA is generally detectable in upper respiratory specimens during the acute phase of infection. Positive results are indicative of the presence of the identified virus, but do not rule out bacterial infection or co-infection with other pathogens not detected by the test. Clinical correlation with patient history and other diagnostic information is necessary to determine patient infection status. The expected result is Negative.  Fact Sheet for Patients: EntrepreneurPulse.com.au  Fact Sheet for Healthcare Providers: IncredibleEmployment.be  This test is not yet approved or cleared by the [place] FDA and  has been authorized for detection and/or diagnosis of SARS-CoV-2 by FDA under an Emergency Use Authorization (EUA).  This EUA  will remain in effect (meaning this test can be used) for the duration of  the COVID-19 declaration under Section 564(b)(1) of the A ct, 21 U.S.C. section 360bbb-3(b)(1), unless the authorization is terminated or revoked sooner.     Influenza A by PCR NEGATIVE NEGATIVE Final   Influenza B by PCR NEGATIVE NEGATIVE Final    Comment: (NOTE) The Xpert Xpress SARS-CoV-2/FLU/RSV plus assay is intended as an aid in the diagnosis of influenza from Nasopharyngeal swab specimens and should not be used as a sole basis for treatment. Nasal washings and aspirates are unacceptable for Xpert Xpress SARS-CoV-2/FLU/RSV testing.  Fact Sheet for Patients: EntrepreneurPulse.com.au  Fact Sheet for Healthcare Providers: IncredibleEmployment.be  This test is not yet approved or cleared by the [place] FDA and has been authorized for detection and/or diagnosis of SARS-CoV-2 by FDA under an Emergency Use Authorization (EUA). This EUA will remain in effect (meaning this test can be used) for the duration of the COVID-19 declaration under Section 564(b)(1) of the Act, 21 U.S.C. section 360bbb-3(b)(1), unless the authorization is terminated or revoked.  Performed at [facility], [address] [place], [place] [address]   Blood culture (routine x 2)     Status: None (Preliminary result)   Collection Time: 09/27/21  7:33 PM   Specimen: Left Antecubital; Blood  Result Value Ref Range Status   Specimen Description   Final    LEFT ANTECUBITAL Performed at [facility], [address], [place], [place] [address]    Special Requests   Final    BOTTLES DRAWN AEROBIC AND ANAEROBIC Blood Culture results may not be optimal due to an inadequate volume of blood received in culture bottles Performed at [facility], [address] [place], [place] [address]    Culture  Setup Time   Final    GRAM POSITIVE COCCI IN BOTH AEROBIC AND  ANAEROBIC BOTTLES CRITICAL RESULT CALLED TO, READ BACK BY AND VERIFIED WITH: [place] @1432  09/28/21 SCS    Culture   Final    GRAM POSITIVE COCCI TOO YOUNG TO READ Performed at [facility], [address], [place], [place] [address]    Report Status PENDING  Incomplete  Blood culture (routine x 2)     Status: None (Preliminary result)   Collection Time: 09/27/21  7:33 PM   Specimen: Right Antecubital; Blood  Result Value Ref Range Status   Specimen Description   Final    RIGHT ANTECUBITAL Performed at [facility], [place],  [place] [address]    Special Requests   Final    BOTTLES DRAWN AEROBIC AND ANAEROBIC Blood Culture results may not be optimal due to an inadequate volume of blood received in culture bottles Performed at [facility], [address] [place], [place] [address]    Culture  Setup Time   Final    AEROBIC BOTTLE ONLY GRAM POSITIVE COCCI CRITICAL VALUE NOTED.  VALUE IS CONSISTENT WITH PREVIOUSLY REPORTED AND CALLED VALUE. SCS 09/28/21     Culture   Final    GRAM POSITIVE COCCI TOO YOUNG TO READ Performed at [facility], [place] [address] [place], [place] [address]    Report Status PENDING  Incomplete  Blood Culture ID Panel (Reflexed)     Status: Abnormal   Collection Time: 09/27/21  7:33 PM  Result Value Ref Range Status   Enterococcus faecalis NOT DETECTED NOT DETECTED Final   Enterococcus Faecium NOT DETECTED NOT DETECTED Final   Listeria monocytogenes NOT DETECTED NOT DETECTED Final   Staphylococcus species DETECTED (A) NOT DETECTED Final    Comment: CRITICAL RESULT CALLED TO, READ BACK BY AND VERIFIED WITH: SUSAN WATSON @1432  09/28/21 SCS    Staphylococcus aureus (BCID) NOT DETECTED NOT DETECTED Final   Staphylococcus epidermidis NOT DETECTED NOT DETECTED Final   Staphylococcus lugdunensis NOT DETECTED NOT DETECTED Final   Streptococcus species NOT DETECTED NOT DETECTED Final   Streptococcus agalactiae  NOT DETECTED NOT DETECTED Final   Streptococcus pneumoniae NOT DETECTED NOT DETECTED Final   Streptococcus pyogenes NOT DETECTED NOT DETECTED Final   A.calcoaceticus-baumannii NOT DETECTED NOT DETECTED Final   Bacteroides fragilis NOT DETECTED NOT DETECTED Final   Enterobacterales NOT DETECTED NOT DETECTED Final   Enterobacter cloacae complex NOT DETECTED NOT DETECTED Final   Escherichia coli NOT DETECTED NOT DETECTED Final   Klebsiella aerogenes NOT DETECTED NOT DETECTED Final   Klebsiella oxytoca NOT DETECTED NOT DETECTED Final   Klebsiella pneumoniae NOT DETECTED NOT DETECTED Final   Proteus species NOT DETECTED NOT DETECTED Final   Salmonella species NOT DETECTED NOT DETECTED Final   Serratia marcescens NOT DETECTED NOT DETECTED Final   Haemophilus influenzae NOT DETECTED NOT DETECTED Final   Neisseria meningitidis NOT DETECTED NOT DETECTED Final   Pseudomonas aeruginosa NOT DETECTED NOT DETECTED Final   Stenotrophomonas maltophilia NOT DETECTED NOT DETECTED Final   Candida albicans NOT DETECTED NOT DETECTED Final   Candida auris NOT DETECTED NOT DETECTED Final   Candida glabrata NOT DETECTED NOT DETECTED Final   Candida krusei NOT DETECTED NOT DETECTED Final   Candida parapsilosis NOT DETECTED NOT DETECTED Final   Candida tropicalis NOT DETECTED NOT DETECTED Final   Cryptococcus neoformans/gattii NOT DETECTED NOT DETECTED Final    Comment: Performed at [facility], [address] [place], [place] [address]  MRSA Next Gen by PCR, Nasal     Status: None   Collection Time: 09/28/21  2:00 PM  Result Value Ref Range Status   MRSA by PCR Next Gen NOT DETECTED NOT DETECTED Final    Comment: (NOTE) The GeneXpert MRSA Assay (FDA approved for NASAL specimens only), is one component of a comprehensive MRSA colonization surveillance program. It is not intended to diagnose MRSA infection nor to guide or monitor treatment for MRSA infections. Test performance is not FDA approved  in patients less than 46 years old. Performed at [facility], [address], [place], [place] [address]          Radiology Studies: [place]  Pelvis 1-2 Views  Result Date: 09/28/2021 CLINICAL DATA:  Trauma, fall EXAM: PELVIS - 1-2 VIEW COMPARISON:  None. FINDINGS: Slightly oblique positioning limits evaluation. As far as seen, there are no displaced fractures. There is foreshortening in the neck of right femur without definite break in the cortical margins. There is no dislocation. IMPRESSION: Technically limited study. No displaced fracture or dislocation is seen. There is foreshortening of neck of the right femur without definite fracture lines. This could easily be due to slightly oblique positioning. Less likely possibility would be fracture. Please correlate with clinical physical examination findings and consider routine radiographs of right hip including lateral view or CT scan if warranted. Electronically Signed   By: Elmer Picker M.D.   On: 09/28/2021 10:04   DG Forearm Right  Result Date: 09/27/2021 CLINICAL DATA:  Fall this morning.  RIGHT arm pain. EXAM: RIGHT FOREARM - 2 VIEW COMPARISON:  None. FINDINGS: Displaced fracture of the ulnar styloid process, likely chronic. Radius and ulna appear otherwise intact and normally aligned throughout. Carpal bones appear grossly intact and normally aligned. Degenerative osteoarthritic changes noted at the scaphoid-trapezium joint space. IMPRESSION: 1. Displaced fracture of the ulnar styloid process, likely chronic. 2. No acute-appearing fracture or dislocation seen. 3. 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 Electronically Signed   By: Franki Cabot M.D.   On: 09/27/2021 20:24   DG Wrist Complete Right  Result Date: 09/27/2021 CLINICAL DATA:  Fall, pain. EXAM: RIGHT WRIST - COMPLETE 3+ VIEW COMPARISON:  None. FINDINGS: Chronic-appearing fracture of the ulnar styloid process. Distal radius and ulna appear  otherwise intact and normally aligned. Carpal bones appear intact and normally aligned. Degenerative osteoarthritic changes noted at the scaphoid-trapezium joint space, moderate in degree with associated articular surface sclerosis. Soft tissues about the RIGHT wrist are unremarkable. IMPRESSION: 1. No acute findings. 2. Chronic-appearing fracture of the ulnar styloid process. 3. 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 Electronically Signed   By: Franki Cabot M.D.   On: 09/27/2021 20:28   DG Knee 1-2 Views Right  Result Date: 09/28/2021 CLINICAL DATA:  Trauma, fall EXAM: RIGHT KNEE - 1-2 VIEW COMPARISON:  None. FINDINGS: No recent fracture or dislocation is seen. Degenerative changes are noted, more severe in the medial compartment. There is no significant effusion in the suprapatellar bursa. Extensive arterial calcifications are seen in the soft tissues. IMPRESSION: No recent fracture or dislocation is seen. Degenerative changes are noted more severe in the medial compartment. Electronically Signed   By: Elmer Picker M.D.   On: 09/28/2021 10:05   CT HEAD WO CONTRAST (5MM)  Result Date: 09/27/2021 CLINICAL DATA:  Mental status change, unknown cause EXAM: CT HEAD WITHOUT CONTRAST TECHNIQUE: Contiguous axial images were obtained from the base of the skull through the vertex without intravenous contrast. RADIATION DOSE REDUCTION: This exam was performed according to the departmental dose-optimization program which includes automated exposure control, adjustment of the mA and/or kV according to patient size and/or use of iterative reconstruction technique. COMPARISON:  08/07/2021 FINDINGS: Brain: Diffuse cerebral atrophy. Severe diffuse chronic small vessel disease throughout the deep white matter. Old left MCA infarct with encephalomalacia throughout the left cerebral hemisphere. No acute intracranial abnormality. Specifically, no hemorrhage, hydrocephalus,  mass lesion, acute infarction, or significant intracranial injury. Vascular: No hyperdense vessel or unexpected calcification. Skull: No acute calvarial abnormality. Sinuses/Orbits: No acute findings Other: None IMPRESSION: Atrophy, severe diffuse chronic small vessel disease. Old left MCA infarct. No acute intracranial abnormality. Electronically  Signed   By: Rolm Baptise M.D.   On: 09/27/2021 20:07   CT Cervical Spine Wo Contrast  Result Date: 09/27/2021 CLINICAL DATA:  Neck trauma (Age >= 65y). EXAM: CT CERVICAL SPINE WITHOUT CONTRAST TECHNIQUE: Multidetector CT imaging of the cervical spine was performed without intravenous contrast. Multiplanar CT image reconstructions were also generated. RADIATION DOSE REDUCTION: This exam was performed according to the departmental dose-optimization program which includes automated exposure control, adjustment of the mA and/or kV according to patient size and/or use of iterative reconstruction technique. COMPARISON:  11/04/2017 FINDINGS: Alignment: No subluxation. Skull base and vertebrae: No acute fracture. No primary bone lesion or focal pathologic process. Soft tissues and spinal canal: No prevertebral fluid or swelling. No visible canal hematoma. Disc levels:  Diffuse degenerative disc disease and facet disease. Upper chest: No acute abnormality. Other: None IMPRESSION: Degenerative disc and facet disease.  No acute bony abnormality. Electronically Signed   By: Rolm Baptise M.D.   On: 09/27/2021 20:10   DG Chest Portable 1 View  Result Date: 09/27/2021 CLINICAL DATA:  Shortness of breath.  Recently diagnosed with COVID. EXAM: PORTABLE CHEST 1 VIEW COMPARISON:  Chest x-ray dated 09/23/2021. FINDINGS: New patchy airspace opacities within the RIGHT mid lung region. No pleural effusion or pneumothorax is seen. Heart size and mediastinal contours are stable. Again noted is a large hiatal hernia. IMPRESSION: 1. New patchy airspace opacities within the RIGHT mid lung  region, consistent with pneumonia, presumably COVID pneumonia. 2. Large hiatal hernia. Electronically Signed   By: Franki Cabot M.D.   On: 09/27/2021 20:22   DG Shoulder Left  Result Date: 09/28/2021 CLINICAL DATA:  Trauma, fall EXAM: LEFT SHOULDER - 2+ VIEW COMPARISON:  None. FINDINGS: No displaced fracture or dislocation is seen. Osteopenia is seen in bony structures. IMPRESSION: No fracture or dislocation is seen in the left shoulder. Electronically Signed   By: Elmer Picker M.D.   On: 09/28/2021 10:08   DG Humerus Right  Result Date: 09/27/2021 CLINICAL DATA:  Fall, pain. EXAM: RIGHT HUMERUS - 2+ VIEW COMPARISON:  None. FINDINGS: Osseous alignment is normal. No fracture line or displaced fracture fragment is seen. Adjacent soft tissues are unremarkable. IMPRESSION: Negative. Electronically Signed   By: Franki Cabot M.D.   On: 09/27/2021 20:27   DG Hand Complete Right  Result Date: 09/27/2021 CLINICAL DATA:  Fall, pain. EXAM: RIGHT HAND - COMPLETE 3+ VIEW COMPARISON:  None. FINDINGS: Displaced fracture of the ulnar styloid process, likely chronic. Distal radius and ulna appear otherwise intact and normally aligned. Carpal bones appear intact. Questionable nondisplaced fracture at the base of the fifth middle phalanx. Degenerative osteoarthritic changes noted at the scaphoid-trapezium joint space, moderate in degree with articular surface sclerosis. Interphalangeal joint spaces are relatively well maintained. IMPRESSION: 1. Questionable nondisplaced fracture at the base of the fifth middle phalanx. 2. Displaced fracture of the ulnar styloid process, likely chronic. 3. 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 Electronically Signed   By: Franki Cabot M.D.   On: 09/27/2021 20:26   DG Foot 2 Views Right  Result Date: 09/28/2021 CLINICAL DATA:  Trauma, fall EXAM: RIGHT FOOT - 2 VIEW COMPARISON:  None. FINDINGS: No displaced fracture or dislocation is  seen. Osteopenia is seen in bony structures. Arterial calcifications are seen in the soft tissues. IMPRESSION: No recent fracture or dislocation is seen in the limited two-view study of right foot. Electronically Signed   By: Elmer Picker M.D.   On: 09/28/2021  10:07   DG HIP UNILAT WITH PELVIS 2-3 VIEWS RIGHT  Result Date: 09/28/2021 CLINICAL DATA:  Unwitnessed fall EXAM: DG HIP (WITH OR WITHOUT PELVIS) 2-3V RIGHT COMPARISON:  None. FINDINGS: Mild degenerative changes in the right hip. No evidence of acute fracture or dislocation. No focal bone lesions identified. Prominent vascular calcifications. IMPRESSION: Degenerative changes in the right hip. No acute displaced fractures identified. Electronically Signed   By: Lucienne Capers M.D.   On: 09/28/2021 19:05        Scheduled Meds:  allopurinol  50 mg Oral Daily   [START ON 09/30/2021] calcitRIOL  0.25 mcg Oral Q M,W,F   clopidogrel  75 mg Oral Daily   docusate sodium  100 mg Oral BID   ferrous sulfate  325 mg Oral BID WC   heparin  5,000 Units Subcutaneous Q8H   isosorbide mononitrate  60 mg Oral Daily   levothyroxine  50 mcg Oral Q0600   methylPREDNISolone (SOLU-MEDROL) injection  40 mg Intravenous Q12H   pantoprazole (PROTONIX) IV  40 mg Intravenous Q12H   raloxifene  60 mg Oral Daily   rosuvastatin  10 mg Oral Daily   sodium bicarbonate  650 mg Oral Daily   Continuous Infusions:  ceFEPime (MAXIPIME) IV Stopped (09/28/21 2251)   remdesivir 100 mg in NS 100 mL 100 mg (09/29/21 0851)     LOS: 2 days    Time spent: 25 min    Desma Maxim, MD [facility]   If 7PM-7AM, please contact night-coverage [email or web address] Password [facility] 09/29/2021, 12:57 PM

## 2021-09-29 NOTE — Evaluation (Signed)
Occupational Therapy Evaluation Patient Details Name: Crystal Faulkner MRN: [id number] DOB: 05-20-1925 Today's Date: 09/29/2021   History of Present Illness 88 y.o. 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  Patient was seen in the emergency department1/30 and was diagnosed with COVID. After returning home, pt had progressive weakness, loss of appetite, and worsening mental status. Pt also had a fall onto the right arm; X-ray of right hand/forearm shows non-acute ulnar styloid fracture and possible 5th phalanx fracture.   Clinical Impression   Pt seen for OT treatment on this date. Upon arrival to room, pt awake in bed with her three daughters present. Difficult to formally assess cognition d/t pt's baseline expressive aphasia, however family reports that pt is more lethargic than baseline. With multi-modal cues, pt unable to consistently follow 1-step instructions this date. At baseline, pt requires SET-UP assistance for feeding, MOD A for functional transfers, and MIN GUARD for functional mobility of short household distances with RW. Pt's family was assisting with dressing, bathing, and toileting. Pt currently requires MAX A for bed mobility, intermittent MIN A for static sitting balance at EOB, and MAX A for sit>stand transfers d/t decreased strength and activity tolerance. Following x2 sit>stand transfers, pt noted to have small BM. Pt required MAX A+2 to return to bed, TOTAL A for peri-care, and MAX A for bed-level UB/LB dressing. Pt would benefit from additional skilled OT services to maximize return to PLOF and minimize risk of future falls, injury, caregiver burden, and readmission. Upon discharge, recommend SNF.      Recommendations for follow up therapy are one component of a multi-disciplinary discharge planning process, led by the attending physician.   Recommendations may be updated based on patient status, additional functional criteria and insurance authorization.   Follow Up Recommendations  Skilled nursing-short term rehab (<3 hours/day)    Assistance Recommended at Discharge Frequent or constant Supervision/Assistance  Patient can return home with the following A lot of help with walking and/or transfers;Two people to help with bathing/dressing/bathroom    Functional Status Assessment  Patient has had a recent decline in their functional status and demonstrates the ability to make significant improvements in function in a reasonable and predictable amount of time.  Equipment Recommendations  Other (comment) (defer to next venue of care)       Precautions / Restrictions Precautions Precautions: Fall Restrictions Weight Bearing Restrictions: No Other Position/Activity Restrictions: Per discussion with MD, no weighbearing or ROM restrictions for right non-acute ulnar styloid fracture. Per ortho, no need for treatment for 5th phalanx fracture.      Mobility Bed Mobility Overal bed mobility: Needs Assistance Bed Mobility: Supine to Sit, Sit to Supine     Supine to sit: Max assist Sit to supine: Max assist        Transfers Overall transfer level: Needs assistance Equipment used: Rolling walker (2 wheels) Transfers: Sit to/from Stand Sit to Stand: Max assist           General transfer comment: Requires MAX A to clear hips from bed, once standing able to maintain balance with MOD A      Balance Overall balance assessment: Needs assistance Sitting-balance support: Bilateral upper extremity supported, Feet supported Sitting balance-Leahy Scale: Poor Sitting balance - Comments: Requires intermittent MIN A for static sitting balance at EOB     Standing balance-Leahy Scale: Poor Standing balance comment: Requires MOD-MAX A  for static standing balance                           ADL either performed or  assessed with clinical judgement   ADL Overall ADL's : Needs assistance/impaired             Lower Body Bathing: Total assistance;Bed level   Upper Body Dressing : Maximal assistance;Bed level Upper Body Dressing Details (indicate cue type and reason): to don/doff hospital gown Lower Body Dressing: Maximal assistance;Bed level Lower Body Dressing Details (indicate cue type and reason): to don/doff underwear     Toileting- Clothing Manipulation and Hygiene: Total assistance;Bed level Toileting - Clothing Manipulation Details (indicate cue type and reason): to perform peri-care              Pertinent Vitals/Pain Pain Assessment Pain Assessment: PAINAD Breathing: normal Negative Vocalization: none Facial Expression: smiling or inexpressive Body Language: rigid, fists clenched, knees up, pushing/pulling away, strikes out Consolability: no need to console PAINAD Score: 2 Pain Intervention(s): Limited activity within patient's tolerance, Monitored during session        Extremity/Trunk Assessment Upper Extremity Assessment Upper Extremity Assessment: Difficult to assess due to impaired cognition (Difficult to formally assess d/t inconsistent commmand follow)   Lower Extremity Assessment Lower Extremity Assessment: Generalized weakness       Communication Communication Communication: Expressive difficulties   Cognition Arousal/Alertness: Awake/alert Behavior During Therapy: Flat affect Overall Cognitive Status: History of cognitive impairments - at baseline                                 General Comments: Difficult to formally assess cognition d/t baseline expressive aphasia, however family reports that pt is more lethargic than baseline. With multi-modal cues, pt inconsistently followed 1-step instructions                Home Living Family/patient expects to be discharged to:: Private residence Living Arrangements: Children Available Help at  Discharge: Family;Available 24 hours/day Type of Home: House Home Access: Stairs to enter [employer] of Steps: 4 Entrance Stairs-Rails: Right;Left;Can reach both Home Layout: One level     Bathroom Shower/Tub: Tub/shower unit         Home Equipment: Conservation officer, nature (2 wheels);Tub bench;Wheelchair - manual          Prior Functioning/Environment Prior Level of Function : Needs assist       Physical Assist : Mobility (physical);ADLs (physical) Mobility (physical): Transfers ADLs (physical): Bathing;Dressing;Toileting;IADLs Mobility Comments: At baseline, pt requires moderate assistance for functional transfers, however once upright, can walk around home with RW and MIN GUARD from family. Family reports only 1 fall in past 12 months (which was the fall that led to this admission) ADLs Comments: Daughter assists with dressing, bathing, peri hygiene & toileting (pt uses depends). Pt can feed herself.        OT Problem List: Decreased strength;Decreased activity tolerance      OT Treatment/Interventions: Self-care/ADL training;Therapeutic exercise;Therapeutic activities;Patient/family education;Balance training    OT Goals(Current goals can be found in the care plan section) Acute Rehab OT Goals Patient Stated Goal: to regain strength OT Goal Formulation: With family Time For Goal Achievement: 11-09-21 Potential to Achieve Goals: Good ADL Goals Pt Will Perform Eating: with min assist;sitting Pt Will Transfer to Toilet: with mod assist;stand pivot transfer Additional ADL Goal #1: Pt will follow at least 25% of 1-step instructions  following 1 tactile and/or verbal cue  OT Frequency: Min 1X/week       AM-PAC OT "6 Clicks" Daily Activity     Outcome Measure Help from another person eating meals?: A Little Help from another person taking care of personal grooming?: A Lot Help from another person toileting, which includes using toliet, bedpan, or urinal?:  Total Help from another person bathing (including washing, rinsing, drying)?: Total Help from another person to put on and taking off regular upper body clothing?: A Lot Help from another person to put on and taking off regular lower body clothing?: A Lot 6 Click Score: 11   End of Session Equipment Utilized During Treatment: Rolling walker (2 wheels);Oxygen Nurse Communication: Mobility status  Activity Tolerance: Patient tolerated treatment well Patient left: in bed;with call bell/phone within reach;with bed alarm set  OT Visit Diagnosis: Unsteadiness on feet (R26.81);Muscle weakness (generalized) (M62.81)                Time: 4680-3212 OT Time Calculation (min): 52 min Charges:  OT General Charges $OT Visit: 1 Visit OT Evaluation $OT Eval Moderate Complexity: 1 Mod OT Treatments $Self Care/Home Management : 23-37 mins  Fredirick Maudlin, OTR/L [place]

## 2021-09-29 NOTE — Progress Notes (Signed)
*  PRELIMINARY RESULTS* Echocardiogram 2D Echocardiogram has been performed.  Claretta Fraise 09/29/2021, 2:35 PM

## 2021-09-29 NOTE — Evaluation (Signed)
Physical Therapy Evaluation Patient Details Name: Crystal LOSEE MRN: [id number] DOB: Dec 30, 1924 Today's Date: 09/29/2021  History of Present Illness  Pt is a 88 y.o. 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  Patient was seen in the emergency department1/30 and was diagnosed with COVID. After returning home, pt had progressive weakness, loss of appetite, and worsening mental status. Pt also had a fall onto the right arm; X-ray of right hand/forearm shows non-acute ulnar styloid fracture and possible 5th phalanx fracture.  MD assessment also includes: COVID Pneumonia, Staph bacteremia, Acute hypoxic respiratory failure, and hypernatremia.   Clinical Impression  Pt with expressive aphasia and difficulty following commands during the session.  Pt required extensive physical assist with all functional tasks and was unable to maintain static standing balance without max A or to take any steps this session.  Pt can typically amb limited household distances and ascend/descend steps with min A from family and currently presents with a significant decline in functional mobility compared to that baseline.  Pt will benefit from PT services in a SNF setting upon discharge to safely address deficits listed in patient problem list for decreased caregiver assistance and eventual return to PLOF.         Recommendations for follow up therapy are one component of a multi-disciplinary discharge planning process, led by the attending physician.  Recommendations may be updated based on patient status, additional functional criteria and insurance authorization.  Follow Up Recommendations Skilled nursing-short term rehab (<3 hours/day)    Assistance Recommended at Discharge Frequent or constant Supervision/Assistance  Patient can return home with the following  Two people to help  with walking and/or transfers;Two people to help with bathing/dressing/bathroom;Assistance with cooking/housework;Assistance with feeding;Direct supervision/assist for medications management;Direct supervision/assist for financial management;Assist for transportation;Help with stairs or ramp for entrance    Equipment Recommendations None recommended by PT  Recommendations for Other Services       Functional Status Assessment Patient has had a recent decline in their functional status and/or demonstrates limited ability to make significant improvements in function in a reasonable and predictable amount of time     Precautions / Restrictions Precautions Precautions: Fall Restrictions Weight Bearing Restrictions: No Other Position/Activity Restrictions: Per MD, no weightbearing or ROM restrictions for right non-acute ulnar styloid fracture. Per ortho, no need for treatment for 5th phalanx fracture.      Mobility  Bed Mobility Overal bed mobility: Needs Assistance Bed Mobility: Supine to Sit, Sit to Supine, Rolling Rolling: Max assist   Supine to sit: Max assist Sit to supine: Max assist   General bed mobility comments: Max A for BLE and trunk control; fair to good static sitting balance once upright at EOB    Transfers Overall transfer level: Needs assistance Equipment used: Rolling walker (2 wheels) Transfers: Sit to/from Stand Sit to Stand: Total assist           General transfer comment: Near total assist to come to standing at EOB and then max A to remain standing    Ambulation/Gait               General Gait Details: Unable  Stairs            Wheelchair Mobility    Modified Rankin (Stroke Patients Only)       Balance Overall balance assessment: Needs assistance Sitting-balance support: Bilateral upper extremity supported,  Feet supported Sitting balance-Leahy Scale: Poor Sitting balance - Comments: Min to mod A to achieve stability in sitting  and then pt able to maintain static sitting balance without physical help     Standing balance-Leahy Scale: Poor Standing balance comment: Requires Max A for static standing balance                             Pertinent Vitals/Pain Pain Assessment Pain Assessment: PAINAD Breathing: normal Negative Vocalization: none Facial Expression: smiling or inexpressive Body Language: relaxed Consolability: no need to console PAINAD Score: 0 Pain Intervention(s): Monitored during session    Home Living Family/patient expects to be discharged to:: Private residence Living Arrangements: Children Available Help at Discharge: Family;Available 24 hours/day Type of Home: House Home Access: Stairs to enter Entrance Stairs-Rails: Right;Left;Can reach both Entrance Stairs-Number of Steps: 4   Home Layout: One level Home Equipment: Conservation officer, nature (2 wheels);Tub bench;Wheelchair - manual      Prior Function Prior Level of Function : Needs assist       Physical Assist : Mobility (physical);ADLs (physical) Mobility (physical): Transfers ADLs (physical): Bathing;Dressing;Toileting;IADLs Mobility Comments: At baseline, pt requires moderate assistance for functional transfers, however once upright, can walk around home with RW and MIN GUARD from family. Family reports only 1 fall in past 12 months (which was the fall that led to this admission) ADLs Comments: Daughter assists with dressing, bathing, peri hygiene & toileting (pt uses depends). Pt can feed herself.     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        General Comments      Exercises Other Exercises Other Exercises: Static sitting at EOB for improved core strength and activity tolerance   Assessment/Plan    PT Assessment Patient needs continued PT services  PT Problem List Decreased strength;Decreased activity tolerance;Decreased balance;Decreased mobility;Decreased knowledge of use of DME       PT Treatment Interventions DME instruction;Gait training;Functional mobility training;Therapeutic activities;Therapeutic exercise;Balance training;Stair training;Patient/family education    PT Goals (Current goals can be found in the Care Plan section)  Acute Rehab PT Goals Patient Stated Goal: To get stronger and back to baseline PT Goal Formulation: With family Time For Goal Achievement: 10/12/21 Potential to Achieve Goals: Fair    Frequency Min 2X/week     Co-evaluation               AM-PAC PT "6 Clicks" Mobility  Outcome Measure Help needed turning from your back to your side while in a flat bed without using bedrails?: A Lot Help needed moving from lying on your back to sitting on the side  of a flat bed without using bedrails?: Total Help needed moving to and from a bed to a chair (including a wheelchair)?: Total Help needed standing up from a chair using your arms (e.g., wheelchair or bedside chair)?: Total Help needed to walk in hospital room?: Total Help needed climbing 3-5 steps with a railing? : Total 6 Click Score: 7    End of Session Equipment Utilized  During Treatment: Gait belt Activity Tolerance: Patient tolerated treatment well Patient left: in bed;with nursing/sitter in room;Other (comment) (Nurse and CNA in room at end of session for bed linen change and pt bathing) Nurse Communication: Mobility status PT Visit Diagnosis: Unsteadiness on feet (R26.81);History of falling (Z91.81);Difficulty in walking, not elsewhere classified (R26.2);Muscle weakness (generalized) (M62.81)    Time: 6761-9509 PT Time Calculation (min) (ACUTE ONLY): 35 min   Charges:   PT Evaluation $PT Eval Moderate Complexity: 1 Mod PT Treatments $Therapeutic Activity: 8-22 mins        D. Royetta Asal PT, DPT 09/29/21, 3:23 PM

## 2021-09-30 DIAGNOSIS — J1282 Pneumonia due to coronavirus disease 2019: Secondary | ICD-10-CM | POA: Diagnosis not present

## 2021-09-30 DIAGNOSIS — U071 COVID-19: Secondary | ICD-10-CM | POA: Diagnosis not present

## 2021-09-30 LAB — BASIC METABOLIC PANEL
Anion gap: 7 (ref 5–15)
BUN: 84 mg/dL — ABNORMAL HIGH (ref 8–23)
CO2: 19 mmol/L — ABNORMAL LOW (ref 22–32)
Calcium: 8.4 mg/dL — ABNORMAL LOW (ref 8.9–10.3)
Chloride: 125 mmol/L — ABNORMAL HIGH (ref 98–111)
Creatinine, Ser: 4.2 mg/dL — ABNORMAL HIGH (ref 0.44–1.00)
GFR, Estimated: 9 mL/min — ABNORMAL LOW (ref >60)
Glucose, Bld: 138 mg/dL — ABNORMAL HIGH (ref 70–99)
Potassium: 4.1 mmol/L (ref 3.5–5.1)
Sodium: 151 mmol/L — ABNORMAL HIGH (ref 135–145)

## 2021-09-30 LAB — CBC
HCT: 23.5 % — ABNORMAL LOW (ref 36.0–46.0)
Hemoglobin: 7.4 g/dL — ABNORMAL LOW (ref 12.0–15.0)
MCH: 30.7 pg (ref 26.0–34.0)
MCHC: 31.5 g/dL (ref 30.0–36.0)
MCV: 97.5 fL (ref 80.0–100.0)
Platelets: 210 10*3/uL (ref 150–400)
RBC: 2.41 MIL/uL — ABNORMAL LOW (ref 3.87–5.11)
RDW: 15.5 % (ref 11.5–15.5)
WBC: 11.3 10*3/uL — ABNORMAL HIGH (ref 4.0–10.5)
nRBC: 0 % (ref 0.0–0.2)

## 2021-09-30 LAB — VANCOMYCIN, RANDOM: Vancomycin Rm: 12

## 2021-09-30 LAB — ECHOCARDIOGRAM COMPLETE
AR max vel: 2.07 cm2
AV Peak grad: 5.7 mmHg
Ao pk vel: 1.19 m/s
Area-P 1/2: 2.7 cm2
Calc EF: 64 %
Height: 60 in
P 1/2 time: 974 msec
S' Lateral: 2.8 cm
Single Plane A2C EF: 56 %
Single Plane A4C EF: 65.4 %
Weight: 1834.23 oz

## 2021-09-30 LAB — LEGIONELLA PNEUMOPHILA SEROGP 1 UR AG: L. pneumophila Serogp 1 Ur Ag: NEGATIVE

## 2021-09-30 MED ORDER — VANCOMYCIN HCL 750 MG/150ML IV SOLN
750.0000 mg | Freq: Once | INTRAVENOUS | Status: AC
  Administered 2021-09-30: 750 mg via INTRAVENOUS
  Filled 2021-09-30 (×2): qty 150

## 2021-09-30 MED ORDER — SODIUM CHLORIDE 0.9 % IV SOLN
1.0000 g | INTRAVENOUS | Status: DC
  Administered 2021-09-30 – 2021-10-01 (×2): 1 g via INTRAVENOUS
  Filled 2021-09-30 (×2): qty 10
  Filled 2021-09-30: qty 1

## 2021-09-30 MED ORDER — DEXTROSE-NACL 5-0.45 % IV SOLN: INTRAVENOUS | Status: DC

## 2021-09-30 MED ORDER — VANCOMYCIN VARIABLE DOSE PER UNSTABLE RENAL FUNCTION (PHARMACIST DOSING): Status: DC

## 2021-09-30 MED ORDER — SODIUM BICARBONATE 650 MG PO TABS
1300.0000 mg | ORAL_TABLET | Freq: Every day | ORAL | Status: DC
  Filled 2021-09-30 (×2): qty 2

## 2021-09-30 MED ORDER — DEXAMETHASONE 6 MG PO TABS
6.0000 mg | ORAL_TABLET | Freq: Every day | ORAL | Status: DC
  Filled 2021-09-30 (×4): qty 1
  Filled 2021-09-30: qty 2

## 2021-09-30 MED ORDER — METHYLPREDNISOLONE SODIUM SUCC 40 MG IJ SOLR
40.0000 mg | Freq: Two times a day (BID) | INTRAMUSCULAR | Status: AC
  Administered 2021-10-01: 01:00:00 40 mg via INTRAVENOUS
  Filled 2021-09-30: qty 1

## 2021-09-30 MED ORDER — VANCOMYCIN HCL 1250 MG/250ML IV SOLN
1250.0000 mg | Freq: Once | INTRAVENOUS | Status: DC
  Filled 2021-09-30: qty 250

## 2021-09-30 NOTE — Consult Note (Signed)
NAME: Crystal Faulkner  DOB: October 28, 1924  MRN: [id number]  Date/Time: 09/30/2021 3:37 PM  REQUESTING PROVIDER: Dr.Wouk Subjective:  REASON FOR CONSULT: Staph capitis bacteremia History available from patient.  Chart reviewed.  Spoke to the daughter at bedside. Crystal Faulkner is a 88 y.o. female with a history of CVA, aphasia, mixed dementia, CKD presented to the ED from home with decreased level of consciousness.  Patient had been diagnosed with COVID a few days and has been gradually getting worse with decreasing level of mentation and generalized weakness being somnolent.  In the ED temperature 97.6, BP 125/73 pulse 78, sats 80%,  Labs revealed WBC of 6.3, hemoglobin 7.4, platelet 190, MCV 103.  BUN 66, creatinine 2.39 CT head showed atrophy with severe diffuse chronic small vessel disease and old left MCA infarct.  There was no acute intracranial abnormality.  Chest x-ray showed right lower lobe infiltrate.  Blood cultures were sent and Patient was started on ceftriaxone and azithromycin.  He also started on remdesivir. His blood culture came back positive for staph capitis patient started on vancomycin and asked to see the patient.  Past Medical History:  Diagnosis Date   Allergy    Alzheimer's disease ([place])    Anemia    CCF (congestive cardiac failure) (HCC)    Chronic kidney disease    Chronic stable angina (HCC)    Frequent falls    Gout    H/O: GI bleed    Hypercholesteremia    Hyperglycemia    Hyperlipidemia    Hypertension    Incontinence    Mixed dementia (HCC)    Myocardial infarction (HCC)    Numbness of right hand    Osteoporosis    Right hand weakness    Stroke ([place])    Thyroid disease    Vitamin D deficiency     Past Surgical History:  Procedure Laterality Date   EXPLORATORY LAPAROTOMY     EYE SURGERY Bilateral    cataract    Social History   Socioeconomic History   Marital status: Widowed    Spouse name: Not on file   Number of children: 6   Years of  education: Not on file   Highest education level: 8th grade  Occupational History   Occupation: Retired  Tobacco Use   Smoking status: Never   Smokeless tobacco: Former    Types: Snuff   Tobacco comments:    smoking cessation materials not requuired  Scientific laboratory technician Use: Never used  Substance and Sexual Activity   Alcohol use: No   Drug use: No   Sexual activity: Not Currently  Other Topics Concern   Not on file  Social History Narrative   She lives with her daughter.    She also has her great nephew and niece at home.    Had a stroke October 2017 and has difficulty walking and also unable to talk since.        Social Determinants of Health   Financial Resource Strain: Low Risk    Difficulty of Paying Living Expenses: Not hard at all  Food Insecurity: No Food Insecurity   Worried About Charity fundraiser in the Last Year: Never true   [place] in the Last Year: Never true  Transportation Needs: No Transportation Needs   Lack of Transportation (Medical): No   Lack of Transportation (Non-Medical): No  Physical Activity: Inactive   Days of Exercise per Week: 0 days   Minutes  of Exercise per Session: 0 min  Stress: No Stress Concern Present   Feeling of Stress : Not at all  Social Connections: Socially Isolated   Frequency of Communication with Friends and Family: Never   Frequency of Social Gatherings with Friends and Family: Twice a week   Attends Religious Services: Never   Marine scientist or Organizations: No   Attends Archivist Meetings: Never   Marital Status: Widowed  Human resources officer Violence: Not At Risk   Fear of Current or Ex-Partner: No   Emotionally Abused: No   Physically Abused: No   Sexually Abused: No    Family History  Problem Relation Age of Onset   Diabetes Sister    Hypertension Sister    Hypertension Brother    Heart disease Brother    No Known Allergies I? Current Facility-Administered Medications   Medication Dose Route Frequency Provider Last Rate Last Admin   acetaminophen (TYLENOL) tablet 650 mg  650 mg Oral Q6H PRN Para Skeans, MD       Or   acetaminophen (TYLENOL) suppository 650 mg  650 mg Rectal Q6H PRN Para Skeans, MD       albuterol (PROVENTIL) (2.5 MG/3ML) 0.083% nebulizer solution 2.5 mg  2.5 mg Nebulization Q2H PRN Para Skeans, MD       allopurinol (ZYLOPRIM) tablet 50 mg  50 mg Oral Daily Gwynne Edinger, MD   50 mg at 09/30/21 6606   bisacodyl (DULCOLAX) EC tablet 5 mg  5 mg Oral Daily PRN Para Skeans, MD       calcitRIOL (ROCALTROL) capsule 0.25 mcg  0.25 mcg Oral Q M,W,F Florina Ou V, MD   0.25 mcg at 09/30/21 3016   cefTRIAXone (ROCEPHIN) 1 g in sodium chloride 0.9 % 100 mL IVPB  1 g Intravenous Q24H Wouk, Ailene Rud, MD       clopidogrel (PLAVIX) tablet 75 mg  75 mg Oral Daily Florina Ou V, MD   75 mg at 09/30/21 0825   dextrose 5 %-0.45 % sodium chloride infusion   Intravenous Continuous Gwynne Edinger, MD 75 mL/hr at 09/30/21 1209 New Bag at 09/30/21 1209   docusate sodium (COLACE) capsule 100 mg  100 mg Oral BID Para Skeans, MD   100 mg at 09/30/21 0109   ferrous sulfate tablet 325 mg  325 mg Oral BID WC Para Skeans, MD   325 mg at 09/30/21 0825   guaiFENesin (MUCINEX) 12 hr tablet 600 mg  600 mg Oral BID PRN Para Skeans, MD       heparin injection 5,000 Units  5,000 Units Subcutaneous Q8H Florina Ou V, MD   5,000 Units at 09/30/21 3235   hydrALAZINE (APRESOLINE) injection 5 mg  5 mg Intravenous Q4H PRN Para Skeans, MD       HYDROcodone-acetaminophen (NORCO/VICODIN) 5-325 MG per tablet 1-2 tablet  1-2 tablet Oral Q4H PRN Para Skeans, MD       isosorbide mononitrate (IMDUR) 24 hr tablet 60 mg  60 mg Oral Daily Florina Ou V, MD   60 mg at 09/30/21 0825   levothyroxine (SYNTHROID) tablet 50 mcg  50 mcg Oral Q0600 Para Skeans, MD   50 mcg at 09/30/21 0542   methylPREDNISolone sodium succinate (SOLU-MEDROL) 40 mg/mL injection 40 mg  40 mg  Intravenous Q12H Gwynne Edinger, MD   40 mg at 09/30/21 0826   ondansetron (ZOFRAN) tablet 4 mg  4 mg  Oral Q6H PRN Para Skeans, MD       Or   ondansetron [facility]) injection 4 mg  4 mg Intravenous Q6H PRN Para Skeans, MD       polyethylene glycol (MIRALAX / GLYCOLAX) packet 17 g  17 g Oral Daily PRN Para Skeans, MD       raloxifene (EVISTA) tablet 60 mg  60 mg Oral Daily Para Skeans, MD   60 mg at 09/30/21 0825   remdesivir 100 mg in sodium chloride 0.9 % 100 mL IVPB  100 mg Intravenous Daily Florina Ou V, MD 200 mL/hr at 09/30/21 0834 100 mg at 09/30/21 0834   rosuvastatin (CRESTOR) tablet 10 mg  10 mg Oral Daily Para Skeans, MD   10 mg at 09/30/21 0825   [START ON 10/01/2021] sodium bicarbonate tablet 1,300 mg  1,300 mg Oral Daily Wouk, Ailene Rud, MD       vancomycin (VANCOREADY) IVPB 750 mg/150 mL  750 mg Intravenous Once Zeigler, Dustin G, RPH       vancomycin variable dose per unstable renal function (pharmacist dosing)   Does not apply See admin instructions Berton Mount, RPH         Abtx:  Anti-infectives (From admission, onward)    Start     Dose/Rate Route Frequency Ordered Stop   09/30/21 2100  cefTRIAXone (ROCEPHIN) 1 g in sodium chloride 0.9 % 100 mL IVPB        1 g 200 mL/hr over 30 Minutes Intravenous Every 24 hours 09/30/21 1532     09/30/21 1600  vancomycin (VANCOREADY) IVPB 750 mg/150 mL        750 mg 150 mL/hr over 60 Minutes Intravenous  Once 09/30/21 1325     09/30/21 1215  vancomycin (VANCOREADY) IVPB 1250 mg/250 mL  Status:  Discontinued        1,250 mg 166.7 mL/hr over 90 Minutes Intravenous  Once 09/30/21 1119 09/30/21 1145   09/30/21 1145  vancomycin variable dose per unstable renal function (pharmacist dosing)         Does not apply See admin instructions 09/30/21 1146     09/28/21 2100  ceFEPIme (MAXIPIME) 1 g in sodium chloride 0.9 % 100 mL IVPB  Status:  Discontinued        1 g 200 mL/hr over 30 Minutes Intravenous Every 24 hours 09/27/21  2225 09/30/21 1532   09/28/21 1000  remdesivir 100 mg in sodium chloride 0.9 % 100 mL IVPB       See Hyperspace for full Linked Orders Report.   100 mg 200 mL/hr over 30 Minutes Intravenous Daily 09/27/21 2303 10/02/21 0959   09/27/21 2315  remdesivir 200 mg in sodium chloride 0.9% 250 mL IVPB       See Hyperspace for full Linked Orders Report.   200 mg 580 mL/hr over 30 Minutes Intravenous Once 09/27/21 2303 09/28/21 0350   09/27/21 2230  vancomycin (VANCOREADY) IVPB 1250 mg/250 mL        1,250 mg 166.7 mL/hr over 90 Minutes Intravenous  Once 09/27/21 2224 09/28/21 0141   09/27/21 2100  vancomycin (VANCOCIN) IVPB 1000 mg/200 mL premix  Status:  Discontinued        1,000 mg 200 mL/hr over 60 Minutes Intravenous  Once 09/27/21 2046 09/27/21 2224   09/27/21 2100  ceFEPIme (MAXIPIME) 2 g in sodium chloride 0.9 % 100 mL IVPB        2 g 200 mL/hr over 30  Minutes Intravenous  Once 09/27/21 2046 09/27/21 2351       REVIEW OF SYSTEMS:  NA Objective:  VITALS:  BP 128/69 (BP Location: Right Arm)    Pulse 73    Temp 98.4 F (36.9 C) (Oral)    Resp 16    Ht 5' (1.524 m)    Wt 52 kg    SpO2 97%    BMI 22.39 kg/m  PHYSICAL EXAM:  General: Awake, does not follow any commands, aphasic, very frail, no respiratory distress at rest Head: Normocephalic, without obvious abnormality, atraumatic. Eyes: Conjunctivae clear, anicteric sclerae. Pupils are equal ENT Nares normal. No drainage or sinus tenderness. Oral cavity cannot be examined Neck: , symmetrical, no adenopathy, thyroid: non tender no carotid bruit and no JVD. Lungs: Bilateral air entry.  Crepitations in the bases Heart: S1-S2 Abdomen: Soft,  Extremities: Edema ankle Skin: Very thin with multiple tearing of skin of the upper extremity   lymph: Cervical, supraclavicular normal. Neurologic: Left hemiplegia, Pertinent Labs Lab Results CBC    Component Value Date/Time   WBC 11.3 (H) 09/30/2021 0528   RBC 2.41 (L) 09/30/2021 0528    HGB 7.4 (L) 09/30/2021 0528   HGB 11.8 10/16/2015 1257   HCT 23.5 (L) 09/30/2021 0528   HCT 36.5 10/16/2015 1257   PLT 210 09/30/2021 0528   PLT 292 10/16/2015 1257   MCV 97.5 09/30/2021 0528   MCV 86 10/16/2015 1257   MCV 85 07/08/2012 1734   MCH 30.7 09/30/2021 0528   MCHC 31.5 09/30/2021 0528   RDW 15.5 09/30/2021 0528   RDW 16.0 (H) 10/16/2015 1257   RDW 16.8 (H) 07/08/2012 1734   LYMPHSABS 1.2 09/27/2021 1844   LYMPHSABS 2.6 10/16/2015 1257   MONOABS 0.7 09/27/2021 1844   EOSABS 0.0 09/27/2021 1844   EOSABS 0.3 10/16/2015 1257   BASOSABS 0.0 09/27/2021 1844   BASOSABS 0.1 10/16/2015 1257    CMP Latest Ref Rng & Units 09/30/2021 09/29/2021 09/28/2021  Glucose 70 - 99 mg/dL 138(H) 157(H) 81  BUN 8 - 23 mg/dL 84(H) 66(H) 60(H)  Creatinine 0.44 - 1.00 mg/dL 4.20(H) 4.17(H) 4.08(H)  Sodium 135 - 145 mmol/L 151(H) 146(H) 143  Potassium 3.5 - 5.1 mmol/L 4.1 4.7 4.3  Chloride 98 - 111 mmol/L 125(H) 119(H) 115(H)  CO2 22 - 32 mmol/L 19(L) 18(L) 17(L)  Calcium 8.9 - 10.3 mg/dL 8.4(L) 8.1(L) 7.8(L)  Total Protein 6.5 - 8.1 g/dL - - -  Total Bilirubin 0.3 - 1.2 mg/dL - - -  Alkaline Phos 38 - 126 U/L - - -  AST 15 - 41 U/L - - -  ALT 0 - 44 U/L - - -      Microbiology: Recent Results (from the past 240 hour(s))  Resp Panel by RT-PCR (Flu A&B, Covid) Nasopharyngeal Swab     Status: Abnormal   Collection Time: 09/27/21  7:33 PM   Specimen: Nasopharyngeal Swab; Nasopharyngeal(NP) swabs in vial transport medium  Result Value Ref Range Status   SARS Coronavirus 2 by RT PCR POSITIVE (A) NEGATIVE Final    Comment: (NOTE) SARS-CoV-2 target nucleic acids are DETECTED.  The SARS-CoV-2 RNA is generally detectable in upper respiratory specimens during the acute phase of infection. Positive results are indicative of the presence of the identified virus, but do not rule out bacterial infection or co-infection with other pathogens not detected by the test. Clinical correlation with patient  history and other diagnostic information is necessary to determine patient infection status. The expected result  is Negative.  Fact Sheet for Patients: EntrepreneurPulse.com.au  Fact Sheet for Healthcare Providers: IncredibleEmployment.be  This test is not yet approved or cleared by the [place] FDA and  has been authorized for detection and/or diagnosis of SARS-CoV-2 by FDA under an Emergency Use Authorization (EUA).  This EUA will remain in effect (meaning this test can be used) for the duration of  the COVID-19 declaration under Section 564(b)(1) of the A ct, 21 U.S.C. section 360bbb-3(b)(1), unless the authorization is terminated or revoked sooner.     Influenza A by PCR NEGATIVE NEGATIVE Final   Influenza B by PCR NEGATIVE NEGATIVE Final    Comment: (NOTE) The Xpert Xpress SARS-CoV-2/FLU/RSV plus assay is intended as an aid in the diagnosis of influenza from Nasopharyngeal swab specimens and should not be used as a sole basis for treatment. Nasal washings and aspirates are unacceptable for Xpert Xpress SARS-CoV-2/FLU/RSV testing.  Fact Sheet for Patients: EntrepreneurPulse.com.au  Fact Sheet for Healthcare Providers: IncredibleEmployment.be  This test is not yet approved or cleared by the [place] FDA and has been authorized for detection and/or diagnosis of SARS-CoV-2 by FDA under an Emergency Use Authorization (EUA). This EUA will remain in effect (meaning this test can be used) for the duration of the COVID-19 declaration under Section 564(b)(1) of the Act, 21 U.S.C. section 360bbb-3(b)(1), unless the authorization is terminated or revoked.  Performed at [facility], [address] [place], [place] [address]   Blood culture (routine x 2)     Status: Abnormal (Preliminary result)   Collection Time: 09/27/21  7:33 PM   Specimen: Left Antecubital; Blood  Result Value Ref  Range Status   Specimen Description   Final    LEFT ANTECUBITAL Performed at [facility], [address] [place], [place] [address]    Special Requests   Final    BOTTLES DRAWN AEROBIC AND ANAEROBIC Blood Culture results may not be optimal due to an inadequate volume of blood received in culture bottles Performed at [facility], [address], [place], [place] [address]    Culture  Setup Time   Final    GRAM POSITIVE COCCI IN BOTH AEROBIC AND ANAEROBIC BOTTLES CRITICAL RESULT CALLED TO, READ BACK BY AND VERIFIED WITH: SUSAN WATSON @1432  09/28/21 SCS    Culture (A)  Final    STAPHYLOCOCCUS CAPITIS SUSCEPTIBILITIES TO FOLLOW Performed at [facility], [place] [address] [place], [place] [address]    Report Status PENDING  Incomplete  Blood culture (routine x 2)     Status: Abnormal (Preliminary result)   Collection Time: 09/27/21  7:33 PM   Specimen: Right Antecubital; Blood  Result Value Ref Range Status   Specimen Description   Final    RIGHT ANTECUBITAL Performed at [facility], [address] [place], [place] [address]    Special Requests   Final    BOTTLES DRAWN AEROBIC AND ANAEROBIC Blood Culture results may not be optimal due to an inadequate volume of blood received in culture bottles Performed at [facility], [address] [place], [place] [address]    Culture  Setup Time   Final    IN BOTH AEROBIC AND ANAEROBIC BOTTLES GRAM POSITIVE COCCI CRITICAL VALUE NOTED.  VALUE IS CONSISTENT WITH PREVIOUSLY REPORTED AND CALLED VALUE. SCS 09/28/21     Culture (A)  Final    STAPHYLOCOCCUS CAPITIS CULTURE REINCUBATED FOR BETTER GROWTH Performed at [facility], [place] [address], [place],  [address]  Report Status PENDING  Incomplete  Blood Culture ID Panel (Reflexed)     Status: Abnormal   Collection Time: 09/27/21  7:33 PM  Result Value Ref Range Status   Enterococcus faecalis NOT DETECTED NOT DETECTED Final    Enterococcus Faecium NOT DETECTED NOT DETECTED Final   Listeria monocytogenes NOT DETECTED NOT DETECTED Final   Staphylococcus species DETECTED (A) NOT DETECTED Final    Comment: CRITICAL RESULT CALLED TO, READ BACK BY AND VERIFIED WITH: SUSAN WATSON @1432  09/28/21 SCS    Staphylococcus aureus (BCID) NOT DETECTED NOT DETECTED Final   Staphylococcus epidermidis NOT DETECTED NOT DETECTED Final   Staphylococcus lugdunensis NOT DETECTED NOT DETECTED Final   Streptococcus species NOT DETECTED NOT DETECTED Final   Streptococcus agalactiae NOT DETECTED NOT DETECTED Final   Streptococcus pneumoniae NOT DETECTED NOT DETECTED Final   Streptococcus pyogenes NOT DETECTED NOT DETECTED Final   A.calcoaceticus-baumannii NOT DETECTED NOT DETECTED Final   Bacteroides fragilis NOT DETECTED NOT DETECTED Final   Enterobacterales NOT DETECTED NOT DETECTED Final   Enterobacter cloacae complex NOT DETECTED NOT DETECTED Final   Escherichia coli NOT DETECTED NOT DETECTED Final   Klebsiella aerogenes NOT DETECTED NOT DETECTED Final   Klebsiella oxytoca NOT DETECTED NOT DETECTED Final   Klebsiella pneumoniae NOT DETECTED NOT DETECTED Final   Proteus species NOT DETECTED NOT DETECTED Final   Salmonella species NOT DETECTED NOT DETECTED Final   Serratia marcescens NOT DETECTED NOT DETECTED Final   Haemophilus influenzae NOT DETECTED NOT DETECTED Final   Neisseria meningitidis NOT DETECTED NOT DETECTED Final   Pseudomonas aeruginosa NOT DETECTED NOT DETECTED Final   Stenotrophomonas maltophilia NOT DETECTED NOT DETECTED Final   Candida albicans NOT DETECTED NOT DETECTED Final   Candida auris NOT DETECTED NOT DETECTED Final   Candida glabrata NOT DETECTED NOT DETECTED Final   Candida krusei NOT DETECTED NOT DETECTED Final   Candida parapsilosis NOT DETECTED NOT DETECTED Final   Candida tropicalis NOT DETECTED NOT DETECTED Final   Cryptococcus neoformans/gattii NOT DETECTED NOT DETECTED Final    Comment: Performed  at [facility], [address] [place], [place] [address]  MRSA Next Gen by PCR, Nasal     Status: None   Collection Time: 09/28/21  2:00 PM  Result Value Ref Range Status   MRSA by PCR Next Gen NOT DETECTED NOT DETECTED Final    Comment: (NOTE) The GeneXpert MRSA Assay (FDA approved for NASAL specimens only), is one component of a comprehensive MRSA colonization surveillance program. It is not intended to diagnose MRSA infection nor to guide or monitor treatment for MRSA infections. Test performance is not FDA approved in patients less than 73 years old. Performed at [facility], [address] [place], [place] [address]   CULTURE, BLOOD (ROUTINE X 2) w Reflex to ID Panel     Status: None (Preliminary result)   Collection Time: 09/29/21  1:29 PM   Specimen: BLOOD  Result Value Ref Range Status   Specimen Description BLOOD [facility]  Final   Special Requests BOTTLES DRAWN AEROBIC AND ANAEROBIC BCLV  Final   Culture   Final    NO GROWTH < 24 HOURS Performed at [facility], [address] [place], [place] [address]    Report Status PENDING  Incomplete  CULTURE, BLOOD (ROUTINE X 2) w Reflex to ID Panel     Status: None (Preliminary result)   Collection Time: 09/29/21  3:25 PM   Specimen: BLOOD  Result Value Ref Range  Status   Specimen Description BLOOD [facility]  Final   Special Requests BOTTLES DRAWN AEROBIC AND ANAEROBIC BCAV  Final   Culture   Final    NO GROWTH < 24 HOURS Performed at [facility], [address] [place], [place] [address]    Report Status PENDING  Incomplete    IMAGING RESULTS:  I have personally reviewed the films New patchy airspace opacities within the RIGHT mid lung region, consistent with pneumonia, presumably COVID pneumonia. 2. Large hiatal hernia.?   Impression/Recommendation ? Encephalopathy and weakness worse than baseline With underlying CVA, aphasia, mixed dementia   COVID respiratory illness with  pneumonia.  Patient is stable with no respiratory distress currently.Marland Kitchen  Received remdesivir.  Also on steroids.  Patient is also getting ceftriaxone.  End date 10/03/2021.  Staphylococcus capitis bacteremia.  Could be from the skin and chest for multiple tears because of being very thin and frail Patient is day 5 of vancomycin adjusted to her creatinine clearance.  End date would be 10/03/2021.  We will give her a dose of daptomycin instead of Vanco tomorrow.  AKI on CKD  CVA/aphasia/dementia  Macrocytic anemia ? ___Hypernatremia   Severe frailty of old age  Hypothyroidism on Synthroid ________________________________________________ Discussed the management with the daughter Requesting provider.  Note:  This document was prepared using Dragon voice recognition software and may include unintentional dictation errors.

## 2021-09-30 NOTE — Progress Notes (Addendum)
PROGRESS NOTE    Crystal Faulkner  ZOX:[id number] DOB: [birth year] DOA: 09/27/2021 PCP: Steele Sizer, MD  Outpatient Specialists: nephrology, cardiology    Brief Narrative:   From admission h and p Crystal Faulkner is a 88 y.o. female presenting with AMS and recent covid diagnosis on Monday and since then pt has been declining with mentation and generalized weakness and worsening mental status and somnolent. HPI is difficult as pt is aphasic and cant give history. Pt was noted to have fall today.   Assessment & Plan:   Principal Problem:   Pneumonia Active Problems:   Coronary artery disease   Benign hypertension   Adult hypothyroidism   Acute kidney injury superimposed on chronic kidney disease (HCC)   AMS (altered mental status)   History of CVA in adulthood   # COVID Pneumonia # CAP 1 week of symptoms. Family says tested positive 1/30. Procal is markedly elevated and there are right middle lung opacities on cxr. Mrsa pcr neg. Strep urine antigen neg - cont remdesevir - cont steroids - dysphagia diet, slp following - will transition cefepime to ceftriaxone - sputum for culture if can produce - f/u legionella urine antigen  # Staph capitis bacteremia From 2 sites. TTE neg, family says no implanted hardware. No skin infections - cont vanc/ceftriaxone - repeat blood cultures ordered, ngtd - ID consulted  # Acute hypoxic respiratory failure 2/2 CAP/covid pneumonia. O2 80s on arrival. No respiratory distress. O2 has now been weaned off - monitor  # Hypernatremia 2/2 not eating/drinking. Na 151 - switch fluids to d5 1/2 ns - advised daughter if patient persists in not eating/drinking this would be sign of end of life - SLP following  # Fall X ray of rigth hand/forearm shows non-acute ulnar styloid fracture, possible 5th phalanx fracture. Also with bruising right leg and left shoulder. Remainder of x-rays negative. Asked Dr. Roland Rack of ortho review, he said these all  represent chronic problems, no indication for acute treatment  # CKD5 Followed by nephro. Has declined dialysis. kidney function appears at baseline, is not volume overloaded - monitor - bicarb is low, will increase home sodium bicarb  # Anemia of chronic kidney disease Hgb ~8 baseline appears to be around 8. Has dark stool but takes iron so stool is chronically dark. Hemoccult positive in the ed. Has remote history of GIB. No hematochezia or hematemesis - will monitor hgb for now  # Gout - home allopurinol  # CAD S/p stent. - cont home plavix  # History CVA CT without acute findings - cont home plavix  # HFpEF Compensated - cont home imdur - lasix on hold given not taking PO  # Hypothyroid - home levothyroxine   DVT prophylaxis: heparin Code Status: DNR Family Communication: daughter updated @ bedside 2/6  Level of care: Progressive Status is: Inpatient Remains inpatient appropriate because: severity of illness      Consultants:  none  Procedures: none  Antimicrobials:  Vanc/cefepime>cefepime    Subjective: No acute distress. Not eating/drinking  Objective: Vitals:   09/30/21 0124 09/30/21 0545 09/30/21 0829 09/30/21 1210  BP: 133/72 126/75 (!) 146/75 128/69  Pulse: 65 77 83 73  Resp: 16 18 16 16   Temp: 97.9 F (36.6 C) 98.1 F (36.7 C) 97.9 F (36.6 C) 98.4 F (36.9 C)  TempSrc: Oral Oral Oral Oral  SpO2: 99% 100% 100% 97%  Weight:      Height:        Intake/Output Summary (Last 24  hours) at 09/30/2021 1530 Last data filed at 09/30/2021 1300 Gross per 24 hour  Intake 880.51 ml  Output 400 ml  Net 480.51 ml   Filed Weights   09/27/21 1846  Weight: 52 kg    Examination:  General exam: Appears calm and comfortable  Respiratory system: rales at bases and on right Cardiovascular system: S1 & S2 heard, RRR. Soft systolic murmur Gastrointestinal system: Abdomen is nondistended, soft and nontender. No organomegaly or masses felt. Normal  bowel sounds heard. Central nervous system: right sided weakness Extremities: right sided contractures. Decreased muscle tone throughout Skin: bruising left shoulder, right leg, skin tears Psychiatry: unable to assess    Data Reviewed: I have personally reviewed following labs and imaging studies  CBC: Recent Labs  Lab 09/23/21 1830 09/27/21 1844 09/28/21 0728 09/28/21 1453 09/28/21 1828 09/29/21 0101 09/29/21 0633 09/30/21 0528  WBC 7.9 7.4 6.3  --   --  8.1  --  11.3*  NEUTROABS 5.7 5.4  --   --   --   --   --   --   HGB 8.9* 8.2* 7.4* 8.2* 8.5* 7.8* 8.2* 7.4*  HCT 29.6* 27.2* 24.4* 27.3* 27.8* 25.3* 26.5* 23.5*  MCV 103.9* 102.6* 103.0*  --   --  98.4  --  97.5  PLT 164 182 190  --   --  219  --  017   Basic Metabolic Panel: Recent Labs  Lab 09/23/21 1830 09/27/21 1844 09/28/21 0728 09/29/21 0101 09/30/21 0528  NA 141 143 143 146* 151*  K 4.1 4.8 4.3 4.7 4.1  CL 108 112* 115* 119* 125*  CO2 23 22 17* 18* 19*  GLUCOSE 92 97 81 157* 138*  BUN 59* 66* 60* 66* 84*  CREATININE 4.03* 4.39* 4.08* 4.17* 4.20*  CALCIUM 8.6* 8.0* 7.8* 8.1* 8.4*   GFR: Estimated Creatinine Clearance: 5.9 mL/min (A) (by C-G formula based on SCr of 4.2 mg/dL (H)). Liver Function Tests: Recent Labs  Lab 09/23/21 1830 09/27/21 1844  AST 52* 37  ALT 25 19  ALKPHOS 52 48  BILITOT 0.3 0.4  PROT 6.1* 6.1*  ALBUMIN 2.7* 2.4*   No results for input(s): LIPASE, AMYLASE in the last 168 hours. No results for input(s): AMMONIA in the last 168 hours. Coagulation Profile: Recent Labs  Lab 09/27/21 1844  INR 1.2   Cardiac Enzymes: No results for input(s): CKTOTAL, CKMB, CKMBINDEX, TROPONINI in the last 168 hours. BNP (last 3 results) No results for input(s): PROBNP in the last 8760 hours. HbA1C: No results for input(s): HGBA1C in the last 72 hours. CBG: No results for input(s): GLUCAP in the last 168 hours. Lipid Profile: No results for input(s): CHOL, HDL, LDLCALC, TRIG, CHOLHDL,  LDLDIRECT in the last 72 hours. Thyroid Function Tests: No results for input(s): TSH, T4TOTAL, FREET4, T3FREE, THYROIDAB in the last 72 hours. Anemia Panel: No results for input(s): VITAMINB12, FOLATE, FERRITIN, TIBC, IRON, RETICCTPCT in the last 72 hours. Urine analysis:    Component Value Date/Time   COLORURINE YELLOW 09/27/2021 2047   APPEARANCEUR CLEAR 09/27/2021 2047   LABSPEC 1.010 09/27/2021 2047   PHURINE 5.5 09/27/2021 2047   GLUCOSEU NEGATIVE 09/27/2021 2047   HGBUR TRACE (A) 09/27/2021 2047   BILIRUBINUR NEGATIVE 09/27/2021 2047   BILIRUBINUR negative 01/27/2017 1146   KETONESUR NEGATIVE 09/27/2021 2047   PROTEINUR NEGATIVE 09/27/2021 2047   UROBILINOGEN negative (A) 01/27/2017 1146   NITRITE NEGATIVE 09/27/2021 2047   LEUKOCYTESUR MODERATE (A) 09/27/2021 2047   Sepsis Labs: @LABRCNTIP (procalcitonin:4,lacticidven:4)  )  Recent Results (from the past 240 hour(s))  Resp Panel by RT-PCR (Flu A&B, Covid) Nasopharyngeal Swab     Status: Abnormal   Collection Time: 09/27/21  7:33 PM   Specimen: Nasopharyngeal Swab; Nasopharyngeal(NP) swabs in vial transport medium  Result Value Ref Range Status   SARS Coronavirus 2 by RT PCR POSITIVE (A) NEGATIVE Final    Comment: (NOTE) SARS-CoV-2 target nucleic acids are DETECTED.  The SARS-CoV-2 RNA is generally detectable in upper respiratory specimens during the acute phase of infection. Positive results are indicative of the presence of the identified virus, but do not rule out bacterial infection or co-infection with other pathogens not detected by the test. Clinical correlation with patient history and other diagnostic information is necessary to determine patient infection status. The expected result is Negative.  Fact Sheet for Patients: EntrepreneurPulse.com.au  Fact Sheet for Healthcare Providers: IncredibleEmployment.be  This test is not yet approved or cleared by the [place]  FDA and  has been authorized for detection and/or diagnosis of SARS-CoV-2 by FDA under an Emergency Use Authorization (EUA).  This EUA will remain in effect (meaning this test can be used) for the duration of  the COVID-19 declaration under Section 564(b)(1) of the A ct, 21 U.S.C. section 360bbb-3(b)(1), unless the authorization is terminated or revoked sooner.     Influenza A by PCR NEGATIVE NEGATIVE Final   Influenza B by PCR NEGATIVE NEGATIVE Final    Comment: (NOTE) The Xpert Xpress SARS-CoV-2/FLU/RSV plus assay is intended as an aid in the diagnosis of influenza from Nasopharyngeal swab specimens and should not be used as a sole basis for treatment. Nasal washings and aspirates are unacceptable for Xpert Xpress SARS-CoV-2/FLU/RSV testing.  Fact Sheet for Patients: EntrepreneurPulse.com.au  Fact Sheet for Healthcare Providers: IncredibleEmployment.be  This test is not yet approved or cleared by the [place] FDA and has been authorized for detection and/or diagnosis of SARS-CoV-2 by FDA under an Emergency Use Authorization (EUA). This EUA will remain in effect (meaning this test can be used) for the duration of the COVID-19 declaration under Section 564(b)(1) of the Act, 21 U.S.C. section 360bbb-3(b)(1), unless the authorization is terminated or revoked.  Performed at [facility], [address] [place], [place] [address]   Blood culture (routine x 2)     Status: Abnormal (Preliminary result)   Collection Time: 09/27/21  7:33 PM   Specimen: Left Antecubital; Blood  Result Value Ref Range Status   Specimen Description   Final    LEFT ANTECUBITAL Performed at [facility], [address], [place], [place] [address]    Special Requests   Final    BOTTLES DRAWN AEROBIC AND ANAEROBIC Blood Culture results may not be optimal due to an inadequate volume of blood received in culture bottles Performed at [facility], [address] [place], [place] [address]    Culture  Setup Time   Final    GRAM POSITIVE COCCI IN BOTH AEROBIC AND ANAEROBIC BOTTLES CRITICAL RESULT CALLED TO, READ BACK BY AND VERIFIED WITH: [place] @1432  09/28/21 SCS    Culture (A)  Final    STAPHYLOCOCCUS CAPITIS SUSCEPTIBILITIES TO FOLLOW Performed at [facility], [place] [address] [place], [place] [address]    Report Status PENDING  Incomplete  Blood culture (routine x 2)     Status: Abnormal (Preliminary result)   Collection Time: 09/27/21  7:33 PM   Specimen: Right Antecubital; Blood  Result Value Ref Range Status  Specimen Description   Final    RIGHT ANTECUBITAL Performed at [facility], [address] [place], [place] [address]    Special Requests   Final    BOTTLES DRAWN AEROBIC AND ANAEROBIC Blood Culture results may not be optimal due to an inadequate volume of blood received in culture bottles Performed at [facility], [address] [place], [place] [address]    Culture  Setup Time   Final    IN BOTH AEROBIC AND ANAEROBIC BOTTLES GRAM POSITIVE COCCI CRITICAL VALUE NOTED.  VALUE IS CONSISTENT WITH PREVIOUSLY REPORTED AND CALLED VALUE. SCS 09/28/21     Culture (A)  Final    STAPHYLOCOCCUS CAPITIS CULTURE REINCUBATED FOR BETTER GROWTH Performed at [facility], [place] [address], [place], [place] [address]    Report Status PENDING  Incomplete  Blood Culture ID Panel (Reflexed)     Status: Abnormal   Collection Time: 09/27/21  7:33 PM  Result Value Ref Range Status   Enterococcus faecalis NOT DETECTED NOT DETECTED Final   Enterococcus Faecium NOT DETECTED NOT DETECTED Final   Listeria monocytogenes NOT DETECTED NOT DETECTED Final   Staphylococcus species DETECTED (A) NOT DETECTED Final    Comment: CRITICAL RESULT CALLED TO, READ BACK BY AND VERIFIED WITH: SUSAN WATSON @1432  09/28/21 SCS    Staphylococcus aureus (BCID) NOT DETECTED NOT DETECTED Final    Staphylococcus epidermidis NOT DETECTED NOT DETECTED Final   Staphylococcus lugdunensis NOT DETECTED NOT DETECTED Final   Streptococcus species NOT DETECTED NOT DETECTED Final   Streptococcus agalactiae NOT DETECTED NOT DETECTED Final   Streptococcus pneumoniae NOT DETECTED NOT DETECTED Final   Streptococcus pyogenes NOT DETECTED NOT DETECTED Final   A.calcoaceticus-baumannii NOT DETECTED NOT DETECTED Final   Bacteroides fragilis NOT DETECTED NOT DETECTED Final   Enterobacterales NOT DETECTED NOT DETECTED Final   Enterobacter cloacae complex NOT DETECTED NOT DETECTED Final   Escherichia coli NOT DETECTED NOT DETECTED Final   Klebsiella aerogenes NOT DETECTED NOT DETECTED Final   Klebsiella oxytoca NOT DETECTED NOT DETECTED Final   Klebsiella pneumoniae NOT DETECTED NOT DETECTED Final   Proteus species NOT DETECTED NOT DETECTED Final   Salmonella species NOT DETECTED NOT DETECTED Final   Serratia marcescens NOT DETECTED NOT DETECTED Final   Haemophilus influenzae NOT DETECTED NOT DETECTED Final   Neisseria meningitidis NOT DETECTED NOT DETECTED Final   Pseudomonas aeruginosa NOT DETECTED NOT DETECTED Final   Stenotrophomonas maltophilia NOT DETECTED NOT DETECTED Final   Candida albicans NOT DETECTED NOT DETECTED Final   Candida auris NOT DETECTED NOT DETECTED Final   Candida glabrata NOT DETECTED NOT DETECTED Final   Candida krusei NOT DETECTED NOT DETECTED Final   Candida parapsilosis NOT DETECTED NOT DETECTED Final   Candida tropicalis NOT DETECTED NOT DETECTED Final   Cryptococcus neoformans/gattii NOT DETECTED NOT DETECTED Final    Comment: Performed at [facility], [address] [place], [place] [address]  MRSA Next Gen by PCR, Nasal     Status: None   Collection Time: 09/28/21  2:00 PM  Result Value Ref Range Status   MRSA by PCR Next Gen NOT DETECTED NOT DETECTED Final    Comment: (NOTE) The GeneXpert MRSA Assay (FDA approved for NASAL specimens only), is one  component of a comprehensive MRSA colonization surveillance program. It is not intended to diagnose MRSA infection nor to guide or monitor treatment for MRSA infections. Test performance is not FDA approved in patients less than 25 years old. Performed  at [facility], [address] [place], [place] [address]   CULTURE, BLOOD (ROUTINE X 2) w Reflex to ID Panel     Status: None (Preliminary result)   Collection Time: 09/29/21  1:29 PM   Specimen: BLOOD  Result Value Ref Range Status   Specimen Description BLOOD [facility]  Final   Special Requests BOTTLES DRAWN AEROBIC AND ANAEROBIC BCLV  Final   Culture   Final    NO GROWTH < 24 HOURS Performed at [facility], [address] [place], [place] [address]    Report Status PENDING  Incomplete  CULTURE, BLOOD (ROUTINE X 2) w Reflex to ID Panel     Status: None (Preliminary result)   Collection Time: 09/29/21  3:25 PM   Specimen: BLOOD  Result Value Ref Range Status   Specimen Description BLOOD [facility]  Final   Special Requests BOTTLES DRAWN AEROBIC AND ANAEROBIC BCAV  Final   Culture   Final    NO GROWTH < 24 HOURS Performed at [facility], [address] [place], [place] [address]    Report Status PENDING  Incomplete         Radiology Studies: ECHOCARDIOGRAM COMPLETE  Result Date: 09/30/2021    ECHOCARDIOGRAM REPORT   Patient Name:   REGGIE WELGE Date of Exam: 09/29/2021 Medical Rec #:  [id number]        Height:       60.0 in Accession #:    [id number]       Weight:       114.6 lb Date of Birth:  Jan 09, 1925         BSA:          1.473 m Patient Age:    70 years         BP:           148/86 mmHg Patient Gender: F                HR:           69 bpm. Exam Location:  [facility] Procedure: 2D Echo Indications:     Bacteremia R78.81  History:         Patient has prior history of Echocardiogram examinations, most                  recent 06/22/2016.  Sonographer:     Kathlen Brunswick RDCS Referring Phys:  [place] WPYK Diagnosing Phys: Isaias Cowman MD IMPRESSIONS  1. Left ventricular ejection fraction, by estimation, is 60 to 65%. The left ventricle has normal function. The left ventricle has no regional wall motion abnormalities. Left ventricular diastolic parameters are consistent with Grade I diastolic dysfunction (impaired relaxation).  2. Right ventricular systolic function is normal. The right ventricular size is normal.  3. The mitral valve is normal in structure. Mild mitral valve regurgitation. No evidence of mitral stenosis.  4. The aortic valve is normal in structure. Aortic valve regurgitation is mild. No aortic stenosis is present.  5. The inferior vena cava is normal in size with greater than 50% respiratory variability, suggesting right atrial pressure of 3 mmHg. FINDINGS  Left Ventricle: Left ventricular ejection fraction, by estimation, is 60 to 65%. The left ventricle has normal function. The left ventricle has no regional wall motion abnormalities. The left ventricular internal cavity size was normal in size. There is  no left ventricular hypertrophy. Left ventricular diastolic parameters are consistent with Grade I diastolic dysfunction (impaired  relaxation). Right Ventricle: The right ventricular size is normal. No increase in right ventricular wall thickness. Right ventricular systolic function is normal. Left Atrium: Left atrial size was normal in size. Right Atrium: Right atrial size was normal in size. Pericardium: There is no evidence of pericardial effusion. Mitral Valve: The mitral valve is normal in structure. Mild mitral valve regurgitation. No evidence of mitral valve stenosis. Tricuspid Valve: The tricuspid valve is normal in structure. Tricuspid valve regurgitation is mild . No evidence of tricuspid stenosis. Aortic Valve: The aortic valve is normal in structure. Aortic valve regurgitation is mild. Aortic regurgitation PHT measures 974 msec. No aortic stenosis is present. Aortic  valve peak gradient measures 5.7 mmHg. Pulmonic Valve: The pulmonic valve was normal in structure. Pulmonic valve regurgitation is not visualized. No evidence of pulmonic stenosis. Aorta: The aortic root is normal in size and structure. Venous: The inferior vena cava is normal in size with greater than 50% respiratory variability, suggesting right atrial pressure of 3 mmHg. IAS/Shunts: No atrial level shunt detected by color flow Doppler.  LEFT VENTRICLE PLAX 2D LVIDd:         4.50 cm     Diastology LVIDs:         2.80 cm     LV e' medial:    3.59 cm/s LV PW:         1.00 cm     LV E/e' medial:  20.9 LV IVS:        0.80 cm     LV e' lateral:   5.33 cm/s LVOT diam:     1.80 cm     LV E/e' lateral: 14.1 LV SV:         42 LV SV Index:   29 LVOT Area:     2.54 cm  LV Volumes (MOD) LV vol d, MOD A2C: 46.4 ml LV vol d, MOD A4C: 51.1 ml LV vol s, MOD A2C: 20.4 ml LV vol s, MOD A4C: 17.7 ml LV SV MOD A2C:     26.0 ml LV SV MOD A4C:     51.1 ml LV SV MOD BP:      32.4 ml RIGHT VENTRICLE RV Basal diam:  2.60 cm RV S prime:     18.30 cm/s TAPSE (M-mode): 1.7 cm LEFT ATRIUM           Index        RIGHT ATRIUM           Index LA diam:      2.30 cm 1.56 cm/m   RA Area:     10.30 cm LA Vol (A2C): 4.8 ml  3.27 ml/m   RA Volume:   19.80 ml  13.44 ml/m LA Vol (A4C): 31.0 ml 21.04 ml/m  AORTIC VALVE                 PULMONIC VALVE AV Area (Vmax): 2.07 cm     PV Vmax:       1.10 m/s AV Vmax:        119.00 cm/s  PV Peak grad:  4.8 mmHg AV Peak Grad:   5.7 mmHg LVOT Vmax:      96.60 cm/s LVOT Vmean:     63.000 cm/s LVOT VTI:       0.166 m AI PHT:         974 msec  AORTA Ao Root diam: 2.80 cm Ao Asc diam:  3.50 cm MITRAL VALVE MV Area (PHT): 2.70 cm  SHUNTS MV Decel Time: 281 msec    Systemic VTI:  0.17 m MV E velocity: 75.00 cm/s  Systemic Diam: 1.80 cm MV A velocity: 89.10 cm/s MV E/A ratio:  0.84 Isaias Cowman MD Electronically signed by Isaias Cowman MD Signature Date/Time: 09/30/2021/1:30:20 PM    Final    DG HIP  UNILAT WITH PELVIS 2-3 VIEWS RIGHT  Result Date: 09/28/2021 CLINICAL DATA:  Unwitnessed fall EXAM: DG HIP (WITH OR WITHOUT PELVIS) 2-3V RIGHT COMPARISON:  None. FINDINGS: Mild degenerative changes in the right hip. No evidence of acute fracture or dislocation. No focal bone lesions identified. Prominent vascular calcifications. IMPRESSION: Degenerative changes in the right hip. No acute displaced fractures identified. Electronically Signed   By: Lucienne Capers M.D.   On: 09/28/2021 19:05        Scheduled Meds:  allopurinol  50 mg Oral Daily   calcitRIOL  0.25 mcg Oral Q M,W,F   clopidogrel  75 mg Oral Daily   docusate sodium  100 mg Oral BID   ferrous sulfate  325 mg Oral BID WC   heparin  5,000 Units Subcutaneous Q8H   isosorbide mononitrate  60 mg Oral Daily   levothyroxine  50 mcg Oral Q0600   methylPREDNISolone (SOLU-MEDROL) injection  40 mg Intravenous Q12H   raloxifene  60 mg Oral Daily   rosuvastatin  10 mg Oral Daily   sodium bicarbonate  650 mg Oral Daily   vancomycin variable dose per unstable renal function (pharmacist dosing)   Does not apply See admin instructions   Continuous Infusions:  ceFEPime (MAXIPIME) IV Stopped (09/29/21 2207)   dextrose 5 % and 0.45% NaCl 75 mL/hr at 09/30/21 1209   remdesivir 100 mg in NS 100 mL 100 mg (09/30/21 0834)   vancomycin       LOS: 3 days    Time spent: 25 min    Desma Maxim, MD [facility]   If 7PM-7AM, please contact night-coverage [email or web address] Password [email or web address] 09/30/2021, 3:30 PM

## 2021-09-30 NOTE — Progress Notes (Signed)
Physical Therapy Treatment Patient Details Name: Crystal Faulkner MRN: [id number] DOB: 03-Nov-1924 Today's Date: 09/30/2021   History of Present Illness Pt is a 88 y.o. 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  Patient was seen in the emergency department1/30 and was diagnosed with COVID. After returning home, pt had progressive weakness, loss of appetite, and worsening mental status. Pt also had a fall onto the right arm; X-ray of right hand/forearm shows non-acute ulnar styloid fracture and possible 5th phalanx fracture.  MD assessment also includes: COVID Pneumonia, Staph bacteremia, Acute hypoxic respiratory failure, and hypernatremia.    PT Comments    Pt asleep but does awake with verbal and tactile cues and BLE AAROM.  She awakens enough to try sitting EOB. Max a x 1 for transitions but once up and stable she is able to sit for 15 minutes with occasional verbal and tactile cues.  She actually does quite well sitting today but is unable to assist with lateral scoot transfers.  Pt's R ankle is inverted and difficult to stretch back to neutral.  Family stated she has a brace at home but it is difficult to don.  Standing is deferred out of concern for ankle stability and ability to bear weight.  Family is hoping for [employer] placement.  The stated there were not other acceptable facilities in [facility] that they would like.  Discussed back up plan with family if [facility] is not able to offer a bed.  Daughter is 24 hour caretaker and they have a wheelchair at home.  If they chose to discharge home they would benefit from a hospital bed, 3-in 1 commode and likely a Hoyer type lift for transfers.  HHPT would also be recommended.   Recommendations for follow up therapy are one component of a multi-disciplinary discharge planning process, led by the  attending physician.  Recommendations may be updated based on patient status, additional functional criteria and insurance authorization.  Follow Up Recommendations  Skilled nursing-short term rehab (<3 hours/day)     Assistance Recommended at Discharge Frequent or constant Supervision/Assistance  Patient can return home with the following Two people to help with walking and/or transfers;Two people to help with bathing/dressing/bathroom;Assistance with cooking/housework;Assistance with feeding;Direct supervision/assist for medications management;Direct supervision/assist for financial management;Assist for transportation;Help with stairs or ramp for entrance   Equipment Recommendations  BSC/3in1;Hospital bed;Other (comment)    Recommendations for Other Services       Precautions / Restrictions Precautions Precautions: Fall Restrictions Weight Bearing Restrictions: No Other Position/Activity Restrictions: Per MD, no weightbearing or ROM restrictions for right non-acute ulnar styloid fracture. Per ortho, no need for treatment for 5th phalanx fracture.     Mobility  Bed Mobility Overal bed mobility: Needs Assistance Bed Mobility: Supine to Sit, Sit to Supine Rolling: Max assist   Supine to sit: Max assist          Transfers                        Ambulation/Gait                   Stairs             Wheelchair Mobility    Modified Rankin (Stroke Patients Only)       Balance Overall balance assessment: Needs assistance Sitting-balance support: Bilateral upper  extremity supported, Feet supported Sitting balance-Leahy Scale: Fair Sitting balance - Comments: able to sit unsupported with occasional verbal and tactile cues - unsafe to be left unattended.                                    Cognition Arousal/Alertness: Awake/alert Behavior During Therapy: Flat affect Overall Cognitive Status: History of cognitive impairments - at  baseline                                 General Comments: Difficult to formally assess cognition d/t baseline expressive aphasia.  nonverbal.        Exercises Other Exercises Other Exercises: sat EOB x 15 minutes, supine AAROM and stretching    General Comments        Pertinent Vitals/Pain Pain Assessment Pain Assessment: Faces Faces Pain Scale: No hurt Pain Location: makes no facial indications of pain    Home Living                          Prior Function            PT Goals (current goals can now be found in the care plan section) Progress towards PT goals: Progressing toward goals    Frequency    Min 2X/week      PT Plan Current plan remains appropriate    Co-evaluation              AM-PAC PT "6 Clicks" Mobility   Outcome Measure  Help needed turning from your back to your side while in a flat bed without using bedrails?: A Lot Help needed moving from lying on your back to sitting on the side of a flat bed without using bedrails?: Total Help needed moving to and from a bed to a chair (including a wheelchair)?: Total Help needed standing up from a chair using your arms (e.g., wheelchair or bedside chair)?: Total Help needed to walk in hospital room?: Total Help needed climbing 3-5 steps with a railing? : Total 6 Click Score: 7    End of Session Equipment Utilized During Treatment: Gait belt Activity Tolerance: Patient tolerated treatment well Patient left: in bed;with nursing/sitter in room;Other (comment) (Nurse and CNA in room at end of session for bed linen change and pt bathing) Nurse Communication: Mobility status PT Visit Diagnosis: Unsteadiness on feet (R26.81);History of falling (Z91.81);Difficulty in walking, not elsewhere classified (R26.2);Muscle weakness (generalized) (M62.81)     Time: 7619-5093 PT Time Calculation (min) (ACUTE ONLY): 27 min  Charges:  $Therapeutic Exercise: 8-22 mins $Therapeutic  Activity: 8-22 mins                    Chesley Noon, PTA 09/30/21, 1:04 PM

## 2021-09-30 NOTE — NC FL2 (Addendum)
[place] LEVEL OF CARE SCREENING TOOL     IDENTIFICATION  Patient Name: Crystal Faulkner Birthdate: [birth year] Sex: female Admission Date (Current Location): 09/27/2021  [facility] and [place] Number:  Engineering geologist and Address:  [facility], [address], [place], [place] [address]      Provider Number: [id number]  Attending Physician Name and Address:  Gwynne Edinger, MD  Relative Name and Phone Number:  quinlan (daughter) [phone]    Current Level of Care: Hospital Recommended Level of Care: [place] Prior Approval Number:    Date Approved/Denied:   PASRR Number: [id number]  Discharge Plan: SNF    Current Diagnoses: Patient Active Problem List   Diagnosis Date Noted   History of CVA in adulthood 09/28/2021   Pneumonia due to COVID-19 virus 09/27/2021   Acute kidney injury superimposed on chronic kidney disease ([place]) 09/27/2021   AMS (altered mental status) 09/27/2021   Acute on chronic anemia 08/09/2021   Pneumonia 08/07/2021   Sepsis ([place]) 10/15/2018   Osteoarthritis 11/19/2017   Dysarthria due to recent cerebrovascular accident (CVA) 06/23/2016   Hemiparesis affecting right side as late effect of cerebrovascular accident (CVA) ([place]) 06/23/2016   CKD (chronic kidney disease) stage 5, GFR less than 15 ml/min ([place]) 06/23/2016   Senile purpura ([place]) 05/13/2016   Hiatal hernia 05/04/2016   Thoracic aorta atherosclerosis ([place]) 05/04/2016   Chronic stable angina ([place]) 10/16/2015   Secondary hyperparathyroidism ([place]) 04/13/2015   Coronary artery disease 04/08/2015   Benign hypertension 04/08/2015   Controlled gout 04/08/2015   History of peptic ulcer disease 04/08/2015   Adult hypothyroidism 04/08/2015   Mild cognitive disorder 04/08/2015   Anemia of chronic disease 04/08/2015   Osteoarthrosis involving more than one site but not generalized 04/08/2015   Osteoporosis 04/08/2015   Pelvic muscle  wasting 04/08/2015   Allergic rhinitis 04/08/2015   Chronic diastolic CHF (congestive heart failure) ([place]) 03/29/2014   HLD (hyperlipidemia) 03/29/2014   H/O gastrointestinal hemorrhage 04/05/2007    Orientation RESPIRATION BLADDER Height & Weight      (disoriented x4)  Normal Incontinent, External catheter Weight: 114 lb 10.2 oz (52 kg) Height:  5' (152.4 cm)  BEHAVIORAL SYMPTOMS/MOOD NEUROLOGICAL BOWEL NUTRITION STATUS      Incontinent Diet (see discharge summary)  AMBULATORY STATUS COMMUNICATION OF NEEDS Skin   Extensive Assist Verbally                         Personal Care Assistance Level of Assistance  Bathing, Feeding, Total care, Dressing Bathing Assistance: Maximum assistance Feeding assistance: Limited assistance Dressing Assistance: Maximum assistance Total Care Assistance: Maximum assistance   Functional Limitations Info  Sight, Hearing, Speech Sight Info: Adequate Hearing Info: Adequate Speech Info: Adequate    SPECIAL CARE FACTORS FREQUENCY  PT (By licensed PT), OT (By licensed OT)     PT Frequency: min 4x weekly OT Frequency: min 4x weekly            Contractures Contractures Info: Not present    Additional Factors Info  Code Status, Allergies Code Status Info: DNR Allergies Info: No Known Allergies    COVID + 09/27/21       Current Medications (09/30/2021):  This is the current hospital active medication list Current Facility-Administered Medications  Medication Dose Route Frequency Provider Last Rate Last Admin   0.9 %  sodium chloride infusion   Intravenous Continuous Wouk, Ailene Rud, MD 75 mL/hr at 09/30/21  [place] at 09/30/21 0819   acetaminophen (TYLENOL) tablet 650 mg  650 mg Oral Q6H PRN Para Skeans, MD       Or   acetaminophen (TYLENOL) suppository 650 mg  650 mg Rectal Q6H PRN Para Skeans, MD       albuterol (PROVENTIL) (2.5 MG/3ML) 0.083% nebulizer solution 2.5 mg  2.5 mg Nebulization Q2H PRN Para Skeans, MD        allopurinol (ZYLOPRIM) tablet 50 mg  50 mg Oral Daily Gwynne Edinger, MD   50 mg at 09/30/21 7017   bisacodyl (DULCOLAX) EC tablet 5 mg  5 mg Oral Daily PRN Para Skeans, MD       calcitRIOL (ROCALTROL) capsule 0.25 mcg  0.25 mcg Oral Q M,W,F Florina Ou V, MD   0.25 mcg at 09/30/21 0826   ceFEPIme (MAXIPIME) 1 g in sodium chloride 0.9 % 100 mL IVPB  1 g Intravenous Q24H Para Skeans, MD   Stopped at 09/29/21 2207   clopidogrel (PLAVIX) tablet 75 mg  75 mg Oral Daily Para Skeans, MD   75 mg at 09/30/21 0825   docusate sodium (COLACE) capsule 100 mg  100 mg Oral BID Florina Ou V, MD   100 mg at 09/30/21 7939   ferrous sulfate tablet 325 mg  325 mg Oral BID WC Para Skeans, MD   325 mg at 09/30/21 0825   guaiFENesin (MUCINEX) 12 hr tablet 600 mg  600 mg Oral BID PRN Para Skeans, MD       heparin injection 5,000 Units  5,000 Units Subcutaneous Q8H Florina Ou V, MD   5,000 Units at 09/30/21 0300   hydrALAZINE (APRESOLINE) injection 5 mg  5 mg Intravenous Q4H PRN Para Skeans, MD       HYDROcodone-acetaminophen (NORCO/VICODIN) 5-325 MG per tablet 1-2 tablet  1-2 tablet Oral Q4H PRN Para Skeans, MD       isosorbide mononitrate (IMDUR) 24 hr tablet 60 mg  60 mg Oral Daily Florina Ou V, MD   60 mg at 09/30/21 0825   levothyroxine (SYNTHROID) tablet 50 mcg  50 mcg Oral Q0600 Para Skeans, MD   50 mcg at 09/30/21 0542   methylPREDNISolone sodium succinate (SOLU-MEDROL) 40 mg/mL injection 40 mg  40 mg Intravenous Q12H Wouk, Ailene Rud, MD   40 mg at 09/30/21 0826   ondansetron (ZOFRAN) tablet 4 mg  4 mg Oral Q6H PRN Para Skeans, MD       Or   ondansetron (ZOFRAN) injection 4 mg  4 mg Intravenous Q6H PRN Para Skeans, MD       polyethylene glycol (MIRALAX / GLYCOLAX) packet 17 g  17 g Oral Daily PRN Para Skeans, MD       raloxifene (EVISTA) tablet 60 mg  60 mg Oral Daily Florina Ou V, MD   60 mg at 09/30/21 0825   remdesivir 100 mg in sodium chloride 0.9 % 100 mL IVPB  100 mg  Intravenous Daily Florina Ou V, MD 200 mL/hr at 09/30/21 0834 100 mg at 09/30/21 0834   rosuvastatin (CRESTOR) tablet 10 mg  10 mg Oral Daily Para Skeans, MD   10 mg at 09/30/21 0825   sodium bicarbonate tablet 650 mg  650 mg Oral Daily Para Skeans, MD   650 mg at 09/30/21 0825     Discharge Medications: Please see discharge summary for a list of discharge medications.  Relevant Imaging Results:  Relevant Lab Results:   Additional Information SSN: [id number]  Alberteen Sam, LCSW

## 2021-09-30 NOTE — TOC Initial Note (Addendum)
Transition of Care [facility]) - Initial/Assessment Note    Patient Details  Name: Crystal Faulkner MRN: [id number] Date of Birth: 05-16-1925  [facility]) CM/SW Contact:    Alberteen Sam, LCSW Phone Number: 09/30/2021, 10:26 AM  Clinical Narrative:                  Update: CSW spoke with Crystal Faulkner who confirms choice of [facility] to send referral. CSW has sent referral. Crystal Faulkner requests that updated covid test be done, as CSW explained due to patient testing positive for covid on 2/3 that patient would need to complete 10 day quarantine before going to snf on 2/13. Crystal Faulkner is hopeful patient will test negative for covid and be able to go to [facility] once bed avail. CSW has informed MD of above. Pending bed offer at this time.    CSW notes patient disoriented x4. CSW spoke with patient's daughter Crystal Faulkner regarding PT rec of SNF at time of discharge.   Crystal Faulkner reports she is in agreement however would need to have family discussion regarding plan and call CSW back. Crystal Faulkner reports if family in agreement, they would prefer [facility].   Pending call back from Crystal Faulkner to confirm agreeable for CSW to send out referrals for bed offers.    Expected Discharge Plan: Skilled Nursing Facility Barriers to Discharge: Continued Medical Work up   Patient Goals and CMS Choice Patient states their goals for this hospitalization and ongoing recovery are:: to go home CMS Medicare.gov Compare Post Acute Care list provided to:: Patient Represenative (must comment) (daughter Crystal Faulkner) Choice offered to / list presented to : Adult Children  Expected Discharge Plan and Services Expected Discharge Plan: [place]                                              Prior Living Arrangements/Services   Lives with:: Self                   Activities of Daily Living Home Assistive Devices/Equipment: Gilford Rile (specify type) ADL Screening (condition  at time of admission) Patient's cognitive ability adequate to safely complete daily activities?: No Is the patient deaf or have difficulty hearing?: No Does the patient have difficulty seeing, even when wearing glasses/contacts?: No Does the patient have difficulty concentrating, remembering, or making decisions?: Yes Patient able to express need for assistance with ADLs?: No Does the patient have difficulty dressing or bathing?: Yes Independently performs ADLs?: No Does the patient have difficulty walking or climbing stairs?: Yes Weakness of Legs: Both Weakness of Arms/Hands: Both  Permission Sought/Granted                  Emotional Assessment       Orientation: :  (disoriented x4) Alcohol / Substance Use: Not Applicable Psych Involvement: No (comment)  Admission diagnosis:  Fall [W19.XXXA] Community acquired pneumonia of right middle lobe of lung [J18.9] AMS (altered mental status) [R41.82] Patient Active Problem List   Diagnosis Date Noted   History of CVA in adulthood 09/28/2021   Pneumonia due to COVID-19 virus 09/27/2021   Acute kidney injury superimposed on chronic kidney disease ([place]) 09/27/2021   AMS (altered mental status) 09/27/2021   Acute on chronic anemia 08/09/2021   Pneumonia 08/07/2021   Sepsis ([place]) 10/15/2018   Osteoarthritis 11/19/2017   Dysarthria due to  recent cerebrovascular accident (CVA) 06/23/2016   Hemiparesis affecting right side as late effect of cerebrovascular accident (CVA) ([place]) 06/23/2016   CKD (chronic kidney disease) stage 5, GFR less than 15 ml/min ([place]) 06/23/2016   Senile purpura ([place]) 05/13/2016   Hiatal hernia 05/04/2016   Thoracic aorta atherosclerosis ([place]) 05/04/2016   Chronic stable angina ([place]) 10/16/2015   Secondary hyperparathyroidism ([place]) 04/13/2015   Coronary artery disease 04/08/2015   Benign hypertension 04/08/2015   Controlled gout 04/08/2015   History of peptic ulcer disease 04/08/2015   Adult hypothyroidism  04/08/2015   Mild cognitive disorder 04/08/2015   Anemia of chronic disease 04/08/2015   Osteoarthrosis involving more than one site but not generalized 04/08/2015   Osteoporosis 04/08/2015   Pelvic muscle wasting 04/08/2015   Allergic rhinitis 04/08/2015   Chronic diastolic CHF (congestive heart failure) ([place]) 03/29/2014   HLD (hyperlipidemia) 03/29/2014   H/O gastrointestinal hemorrhage 04/05/2007   PCP:  Steele Sizer, MD Pharmacy:   [id number] - [place], [place] - [address] [place] [address] Phone: [phone] Fax: [phone]     Social Determinants of Health (SDOH) Interventions    Readmission Risk Interventions No flowsheet data found.

## 2021-09-30 NOTE — Progress Notes (Addendum)
SLP F/U Note  Patient Details Name: Crystal Faulkner MRN: [id number] DOB: Jan 12, 1925   F/U Note:       Reason Eval/Treat Not Completed:  (chart reviewed; consulted w/ Dtr and pt in room) [name] reported pt had done adequately well when swallowing meds Crushed in puree; bites/sips at meals. Daughter reported same: pt took "about 9 bites" of food and sips w/ the Daughter during a meal including bites of ice cream, "then she stopped". Pt requires FULL feeding support; positioning support upright. No overt s/s of aspiration reported by NSG or Dtr. Discussed w/ Daughter the impact that Dementia(per chart history and notes) and illness(including COVID) can have on a pt's swallowing, especially w/ advanced age(pt is 88yo). Pt is also Edentulous not wearing her Dentures right now -- attempting to wear such and masticate solids could be too much "work" for pt in her State and deconditioned status. A Pureed consistency diet would offer the most conservation of energy and hopefully allow the most intake to meet nutritional needs.     Recommended to Daughter to continue w/ the Puree diet as it will most beneficial for the pt for ease of oral intake; hopefully it will allow pt to take in more bites/sips to support nutrition/hydration. Handout left on Puree diet consistency for family. Aspiration precautions also. Recommended to MD for a Palliative Care consult to discuss pt's [place] w/ family moving forward. MD agreed. This f/u can be continued at next venue of care.  MD to reconult if any new needs while admitted.        Crystal Kenner, MS, CCC-SLP Speech Language Pathologist Rehab Services; [place] [phone] (ascom) Damyiah Moxley 09/30/2021, 3:55 PM

## 2021-09-30 NOTE — Progress Notes (Addendum)
Pharmacy Antibiotic Note  ELYSSE Faulkner is a 88 y.o. female admitted on 09/27/2021 with bacteremia.  Pharmacy has been consulted for vancomycin dosing.   2/3 blood cx: GPC 4/4 bottles, BCID = Staphylococcus species (NOT S aureus, S epidermidis or S lugdunensis) 2/5 blood cx: NGTD  Today, 09/30/2021 Day #3 antibiotics - vanco/cefepime Renal: SCr remains stable with PMH of CKD afebrile WBC: slightly elevated but on IV methylprednisolone for COVID  Also receiving remdesivir for COVID On Cefepime for PNA She received vancomycin x1 dose on 2/3 No known cardiac devices   Plan: Since received vancomycin x 1 dose 2/3, check random vancomycin level now prior to redosing d/t poor renal fx Follow culture results to determine need to check random level prior to redosing vancomycin Anticipate vancomycin to remain therapeutic for at least 48h based on her CKD and est CrCl Remains on cefepime 1gm IV q24h Consider changing if mental status not improved (may cause confusion, especially with poor renal function)  ADDENDUM  09/30/2021 1:18 PM Random vancomycin level = 12 mcg/mL Plan: redose vancomycin 750mg  IV x 1 today  Height: 5' (152.4 cm) Weight: 52 kg (114 lb 10.2 oz) IBW/kg (Calculated) : 45.5  Temp (24hrs), Avg:98 F (36.7 C), Min:97.9 F (36.6 C), Max:98.1 F (36.7 C)  Recent Labs  Lab 09/23/21 1830 09/27/21 1844 09/27/21 2050 09/28/21 0728 09/29/21 0101 09/30/21 0528  WBC 7.9 7.4  --  6.3 8.1 11.3*  CREATININE 4.03* 4.39*  --  4.08* 4.17* 4.20*  LATICACIDVEN  --  1.7 1.1  --   --   --     Estimated Creatinine Clearance: 5.9 mL/min (A) (by C-G formula based on SCr of 4.2 mg/dL (H)).    No Known Allergies  Antimicrobials this admission: Vancomycin 1250mg  x 1 2/3 at 23:53 Cefepime 2/3 >> Remdesvir 2/3 >>   Dose adjustments this admission:   Microbiology results: 2/3 BCx: 4/4 GPC, Staph spp 2/5 Bcx: NGTD   Thank you for allowing pharmacy to be a part of this  patients care.  Doreene Eland, PharmD, BCPS, BCIDP Work Cell: [phone] 09/30/2021 11:47 AM

## 2021-10-01 DIAGNOSIS — U071 COVID-19: Secondary | ICD-10-CM | POA: Diagnosis not present

## 2021-10-01 DIAGNOSIS — J1282 Pneumonia due to coronavirus disease 2019: Secondary | ICD-10-CM | POA: Diagnosis not present

## 2021-10-01 DIAGNOSIS — Z7189 Other specified counseling: Secondary | ICD-10-CM | POA: Diagnosis not present

## 2021-10-01 DIAGNOSIS — R7881 Bacteremia: Secondary | ICD-10-CM | POA: Diagnosis not present

## 2021-10-01 DIAGNOSIS — B957 Other staphylococcus as the cause of diseases classified elsewhere: Secondary | ICD-10-CM

## 2021-10-01 LAB — BASIC METABOLIC PANEL
Anion gap: 7 (ref 5–15)
BUN: 86 mg/dL — ABNORMAL HIGH (ref 8–23)
CO2: 18 mmol/L — ABNORMAL LOW (ref 22–32)
Calcium: 8.5 mg/dL — ABNORMAL LOW (ref 8.9–10.3)
Chloride: 126 mmol/L — ABNORMAL HIGH (ref 98–111)
Creatinine, Ser: 3.79 mg/dL — ABNORMAL HIGH (ref 0.44–1.00)
GFR, Estimated: 11 mL/min — ABNORMAL LOW (ref >60)
Glucose, Bld: 161 mg/dL — ABNORMAL HIGH (ref 70–99)
Potassium: 3.9 mmol/L (ref 3.5–5.1)
Sodium: 151 mmol/L — ABNORMAL HIGH (ref 135–145)

## 2021-10-01 LAB — CBC
HCT: 23.5 % — ABNORMAL LOW (ref 36.0–46.0)
Hemoglobin: 7.1 g/dL — ABNORMAL LOW (ref 12.0–15.0)
MCH: 30.5 pg (ref 26.0–34.0)
MCHC: 30.2 g/dL (ref 30.0–36.0)
MCV: 100.9 fL — ABNORMAL HIGH (ref 80.0–100.0)
Platelets: 219 10*3/uL (ref 150–400)
RBC: 2.33 MIL/uL — ABNORMAL LOW (ref 3.87–5.11)
RDW: 15.7 % — ABNORMAL HIGH (ref 11.5–15.5)
WBC: 10.7 10*3/uL — ABNORMAL HIGH (ref 4.0–10.5)
nRBC: 0 % (ref 0.0–0.2)

## 2021-10-01 LAB — CULTURE, BLOOD (ROUTINE X 2)

## 2021-10-01 MED ORDER — DEXTROSE-NACL 5-0.2 % IV SOLN
INTRAVENOUS | Status: DC
  Filled 2021-10-01: qty 1000

## 2021-10-01 NOTE — Progress Notes (Signed)
OT Cancellation Note  Patient Details Name: CYRIL RAILEY MRN: [id number] DOB: [birth year]   Cancelled Treatment:    Reason Eval/Treat Not Completed: Other (comment). Per rounds, pt is refusing food, drink, and medications and is currently laying in fetal position. Hemoglobin is also at 7.1 this morning. Per chart review, plan for pallative to speck with family on establishing goals of care. OT to hold this morning and will re-attempt when pt is able to be an active participant in OT intervention.   Darleen Crocker, MS, OTR/L , CBIS ascom [phone]  10/01/21, 12:13 PM

## 2021-10-01 NOTE — Progress Notes (Addendum)
PROGRESS NOTE    Crystal Faulkner  HBZ:[id number] DOB: 1925-04-15 DOA: 09/27/2021 PCP: Steele Sizer, MD  Outpatient Specialists: nephrology, cardiology    Brief Narrative:   From admission h and p Crystal Faulkner is a 88 y.o. female presenting with AMS and recent covid diagnosis on Monday and since then pt has been declining with mentation and generalized weakness and worsening mental status and somnolent. HPI is difficult as pt is aphasic and cant give history. Pt was noted to have fall today.   Assessment & Plan:   Principal Problem:   Pneumonia Active Problems:   Coronary artery disease   Benign hypertension   Adult hypothyroidism   Acute kidney injury superimposed on chronic kidney disease (HCC)   AMS (altered mental status)   History of CVA in adulthood  # End of life care Patient not participating in feedings or in other care. This has not improved since admission. Have had several conversations with daughters that this could represent end of life transition. They appear understanding - palliative consulted - continuing to treat the treatable for now  # COVID Pneumonia # CAP 1 week of symptoms. Family says tested positive 1/30. Procal is markedly elevated (though patient is bacteremic....) and there are right middle lung opacities on cxr. Mrsa pcr neg. Strep and legionella urine antigens neg - cont remdesevir - cont steroids - dysphagia diet, slp following - will transition cefepime to ceftriaxone - sputum for culture if can produce - f/u legionella urine antigen  # Staph capitis bacteremia From 2 sites. TTE neg, family says no implanted hardware. No skin infections - cont abx - repeat blood cultures ordered, ngtd - ID consulted, recs pending  # Acute hypoxic respiratory failure 2/2 CAP/covid pneumonia. O2 80s on arrival. No respiratory distress. O2 has now been weaned off - monitor  # Hypernatremia 2/2 not eating/drinking. Na 151 - switch fluids to d5  1/4 ns from 1/2ns - advised daughter if patient persists in not eating/drinking this would be sign of end of life - SLP following  # Fall X ray of rigth hand/forearm shows non-acute ulnar styloid fracture, possible 5th phalanx fracture. Also with bruising right leg and left shoulder. Remainder of x-rays negative. Asked Dr. Roland Rack of ortho review, he said these all represent chronic problems, no indication for acute treatment  # CKD5 Followed by nephro. Has declined dialysis. kidney function appears at baseline, is not volume overloaded - monitor - bicarb is low, will increase home sodium bicarb  # Anemia of chronic kidney disease Hgb ~8 baseline appears to be around 8. Has dark stool but takes iron so stool is chronically dark. Hemoccult positive in the ed. Has remote history of GIB. No hematochezia or hematemesis - will monitor hgb for now  # Gout - home allopurinol  # CAD S/p stent. - cont home plavix  # History CVA CT without acute findings - cont home plavix  # HFpEF Compensated - cont home imdur - lasix on hold given not taking PO  # Hypothyroid - home levothyroxine   DVT prophylaxis: heparin Code Status: DNR Family Communication: daughter updated @ bedside 2/7  Level of care: Med-Surg Status is: Inpatient Remains inpatient appropriate because: severity of illness      Consultants:  none  Procedures: none  Antimicrobials:  Vanc/cefepime>cefepime    Subjective: No acute distress. Not eating/drinking  Objective: Vitals:   10/01/21 0007 10/01/21 0435 10/01/21 0739 10/01/21 1130  BP: 140/74 117/71 134/71 (!) 132/53  Pulse: 73  75 72 (!) 52  Resp: 16 18  17   Temp: 98 F (36.7 C) 98.4 F (36.9 C) 98.6 F (37 C) 98.2 F (36.8 C)  TempSrc: Oral Oral Oral   SpO2: 99% 97% 100% 100%  Weight:      Height:        Intake/Output Summary (Last 24 hours) at 10/01/2021 1551 Last data filed at 10/01/2021 1052 Gross per 24 hour  Intake 615.73 ml  Output  400 ml  Net 215.73 ml   Filed Weights   09/27/21 1846 09/30/21 2100  Weight: 52 kg 50.2 kg    Examination:  General exam: Appears calm and comfortable, asleep Respiratory system: rales at bases and on right Cardiovascular system: S1 & S2 heard, RRR. Soft systolic murmur Gastrointestinal system: Abdomen is nondistended, soft and nontender. No organomegaly or masses felt. Normal bowel sounds heard. Central nervous system: right sided weakness Extremities: right sided contractures. Decreased muscle tone throughout Skin: bruising left shoulder, right leg, skin tears Psychiatry: unable to assess    Data Reviewed: I have personally reviewed following labs and imaging studies  CBC: Recent Labs  Lab 09/27/21 1844 09/28/21 0728 09/28/21 1453 09/28/21 1828 09/29/21 0101 09/29/21 0633 09/30/21 0528 10/01/21 0501  WBC 7.4 6.3  --   --  8.1  --  11.3* 10.7*  NEUTROABS 5.4  --   --   --   --   --   --   --   HGB 8.2* 7.4*   < > 8.5* 7.8* 8.2* 7.4* 7.1*  HCT 27.2* 24.4*   < > 27.8* 25.3* 26.5* 23.5* 23.5*  MCV 102.6* 103.0*  --   --  98.4  --  97.5 100.9*  PLT 182 190  --   --  219  --  210 219   < > = values in this interval not displayed.   Basic Metabolic Panel: Recent Labs  Lab 09/27/21 1844 09/28/21 0728 09/29/21 0101 09/30/21 0528 10/01/21 0501  NA 143 143 146* 151* 151*  K 4.8 4.3 4.7 4.1 3.9  CL 112* 115* 119* 125* 126*  CO2 22 17* 18* 19* 18*  GLUCOSE 97 81 157* 138* 161*  BUN 66* 60* 66* 84* 86*  CREATININE 4.39* 4.08* 4.17* 4.20* 3.79*  CALCIUM 8.0* 7.8* 8.1* 8.4* 8.5*   GFR: Estimated Creatinine Clearance: 6.5 mL/min (A) (by C-G formula based on SCr of 3.79 mg/dL (H)). Liver Function Tests: Recent Labs  Lab 09/27/21 1844  AST 37  ALT 19  ALKPHOS 48  BILITOT 0.4  PROT 6.1*  ALBUMIN 2.4*   No results for input(s): LIPASE, AMYLASE in the last 168 hours. No results for input(s): AMMONIA in the last 168 hours. Coagulation Profile: Recent Labs  Lab  09/27/21 1844  INR 1.2   Cardiac Enzymes: No results for input(s): CKTOTAL, CKMB, CKMBINDEX, TROPONINI in the last 168 hours. BNP (last 3 results) No results for input(s): PROBNP in the last 8760 hours. HbA1C: No results for input(s): HGBA1C in the last 72 hours. CBG: No results for input(s): GLUCAP in the last 168 hours. Lipid Profile: No results for input(s): CHOL, HDL, LDLCALC, TRIG, CHOLHDL, LDLDIRECT in the last 72 hours. Thyroid Function Tests: No results for input(s): TSH, T4TOTAL, FREET4, T3FREE, THYROIDAB in the last 72 hours. Anemia Panel: No results for input(s): VITAMINB12, FOLATE, FERRITIN, TIBC, IRON, RETICCTPCT in the last 72 hours. Urine analysis:    Component Value Date/Time   COLORURINE YELLOW 09/27/2021 2047   APPEARANCEUR CLEAR 09/27/2021 2047  LABSPEC 1.010 09/27/2021 2047   PHURINE 5.5 09/27/2021 2047   GLUCOSEU NEGATIVE 09/27/2021 2047   HGBUR TRACE (A) 09/27/2021 2047   BILIRUBINUR NEGATIVE 09/27/2021 2047   BILIRUBINUR negative 01/27/2017 1146   KETONESUR NEGATIVE 09/27/2021 2047   PROTEINUR NEGATIVE 09/27/2021 2047   UROBILINOGEN negative (A) 01/27/2017 1146   NITRITE NEGATIVE 09/27/2021 2047   LEUKOCYTESUR MODERATE (A) 09/27/2021 2047   Sepsis Labs: @LABRCNTIP (procalcitonin:4,lacticidven:4)  ) Recent Results (from the past 240 hour(s))  Resp Panel by RT-PCR (Flu A&B, Covid) Nasopharyngeal Swab     Status: Abnormal   Collection Time: 09/27/21  7:33 PM   Specimen: Nasopharyngeal Swab; Nasopharyngeal(NP) swabs in vial transport medium  Result Value Ref Range Status   SARS Coronavirus 2 by RT PCR POSITIVE (A) NEGATIVE Final    Comment: (NOTE) SARS-CoV-2 target nucleic acids are DETECTED.  The SARS-CoV-2 RNA is generally detectable in upper respiratory specimens during the acute phase of infection. Positive results are indicative of the presence of the identified virus, but do not rule out bacterial infection or co-infection with other  pathogens not detected by the test. Clinical correlation with patient history and other diagnostic information is necessary to determine patient infection status. The expected result is Negative.  Fact Sheet for Patients: EntrepreneurPulse.com.au  Fact Sheet for Healthcare Providers: IncredibleEmployment.be  This test is not yet approved or cleared by the [place] FDA and  has been authorized for detection and/or diagnosis of SARS-CoV-2 by FDA under an Emergency Use Authorization (EUA).  This EUA will remain in effect (meaning this test can be used) for the duration of  the COVID-19 declaration under Section 564(b)(1) of the A ct, 21 U.S.C. section 360bbb-3(b)(1), unless the authorization is terminated or revoked sooner.     Influenza A by PCR NEGATIVE NEGATIVE Final   Influenza B by PCR NEGATIVE NEGATIVE Final    Comment: (NOTE) The Xpert Xpress SARS-CoV-2/FLU/RSV plus assay is intended as an aid in the diagnosis of influenza from Nasopharyngeal swab specimens and should not be used as a sole basis for treatment. Nasal washings and aspirates are unacceptable for Xpert Xpress SARS-CoV-2/FLU/RSV testing.  Fact Sheet for Patients: EntrepreneurPulse.com.au  Fact Sheet for Healthcare Providers: IncredibleEmployment.be  This test is not yet approved or cleared by the [place] FDA and has been authorized for detection and/or diagnosis of SARS-CoV-2 by FDA under an Emergency Use Authorization (EUA). This EUA will remain in effect (meaning this test can be used) for the duration of the COVID-19 declaration under Section 564(b)(1) of the Act, 21 U.S.C. section 360bbb-3(b)(1), unless the authorization is terminated or revoked.  Performed at [facility], [address] [place], [place] [address]   Blood culture (routine x 2)     Status: Abnormal   Collection Time: 09/27/21  7:33 PM    Specimen: Left Antecubital; Blood  Result Value Ref Range Status   Specimen Description   Final    LEFT ANTECUBITAL Performed at [facility], [address] [place], [place] [address]    Special Requests   Final    BOTTLES DRAWN AEROBIC AND ANAEROBIC Blood Culture results may not be optimal due to an inadequate volume of blood received in culture bottles Performed at [facility], [address], [place], [place] [address]    Culture  Setup Time   Final    GRAM POSITIVE COCCI IN BOTH AEROBIC AND ANAEROBIC BOTTLES CRITICAL RESULT CALLED TO, READ BACK BY AND VERIFIED WITH: [place] @1432  09/28/21 SCS  Culture STAPHYLOCOCCUS CAPITIS (A)  Final   Report Status 10/01/2021 FINAL  Final   Organism ID, Bacteria STAPHYLOCOCCUS CAPITIS  Final      Susceptibility   Staphylococcus capitis - MIC*    CIPROFLOXACIN <=0.5 SENSITIVE Sensitive     ERYTHROMYCIN 0.5 SENSITIVE Sensitive     GENTAMICIN <=0.5 SENSITIVE Sensitive     OXACILLIN RESISTANT Resistant     TETRACYCLINE <=1 SENSITIVE Sensitive     VANCOMYCIN <=0.5 SENSITIVE Sensitive     TRIMETH/SULFA <=10 SENSITIVE Sensitive     CLINDAMYCIN <=0.25 SENSITIVE Sensitive     RIFAMPIN <=0.5 SENSITIVE Sensitive     Inducible Clindamycin NEGATIVE Sensitive     * STAPHYLOCOCCUS CAPITIS  Blood culture (routine x 2)     Status: Abnormal (Preliminary result)   Collection Time: 09/27/21  7:33 PM   Specimen: Right Antecubital; Blood  Result Value Ref Range Status   Specimen Description   Final    RIGHT ANTECUBITAL Performed at [facility], [address] [place], [place] [address]    Special Requests   Final    BOTTLES DRAWN AEROBIC AND ANAEROBIC Blood Culture results may not be optimal due to an inadequate volume of blood received in culture bottles Performed at [facility], [address] [place], [place] [address]    Culture  Setup Time   Final    IN BOTH AEROBIC AND ANAEROBIC BOTTLES GRAM  POSITIVE COCCI CRITICAL VALUE NOTED.  VALUE IS CONSISTENT WITH PREVIOUSLY REPORTED AND CALLED VALUE. SCS 09/28/21     Culture (A)  Final    STAPHYLOCOCCUS CAPITIS SUSCEPTIBILITIES PERFORMED ON PREVIOUS CULTURE WITHIN THE LAST 5 DAYS. Performed at [facility], [place] [address] [place],  [address]    Report Status PENDING  Incomplete  Blood Culture ID Panel (Reflexed)     Status: Abnormal   Collection Time: 09/27/21  7:33 PM  Result Value Ref Range Status   Enterococcus faecalis NOT DETECTED NOT DETECTED Final   Enterococcus Faecium NOT DETECTED NOT DETECTED Final   Listeria monocytogenes NOT DETECTED NOT DETECTED Final   Staphylococcus species DETECTED (A) NOT DETECTED Final    Comment: CRITICAL RESULT CALLED TO, READ BACK BY AND VERIFIED WITH: SUSAN WATSON @1432  09/28/21 SCS    Staphylococcus aureus (BCID) NOT DETECTED NOT DETECTED Final   Staphylococcus epidermidis NOT DETECTED NOT DETECTED Final   Staphylococcus lugdunensis NOT DETECTED NOT DETECTED Final   Streptococcus species NOT DETECTED NOT DETECTED Final   Streptococcus agalactiae NOT DETECTED NOT DETECTED Final   Streptococcus pneumoniae NOT DETECTED NOT DETECTED Final   Streptococcus pyogenes NOT DETECTED NOT DETECTED Final   A.calcoaceticus-baumannii NOT DETECTED NOT DETECTED Final   Bacteroides fragilis NOT DETECTED NOT DETECTED Final   Enterobacterales NOT DETECTED NOT DETECTED Final   Enterobacter cloacae complex NOT DETECTED NOT DETECTED Final   Escherichia coli NOT DETECTED NOT DETECTED Final   Klebsiella aerogenes NOT DETECTED NOT DETECTED Final   Klebsiella oxytoca NOT DETECTED NOT DETECTED Final   Klebsiella pneumoniae NOT DETECTED NOT DETECTED Final   Proteus species NOT DETECTED NOT DETECTED Final   Salmonella species NOT DETECTED NOT DETECTED Final   Serratia marcescens NOT DETECTED NOT DETECTED Final   Haemophilus influenzae NOT DETECTED NOT DETECTED Final   Neisseria meningitidis NOT DETECTED NOT  DETECTED Final   Pseudomonas aeruginosa NOT DETECTED NOT DETECTED Final   Stenotrophomonas maltophilia NOT DETECTED NOT DETECTED Final   Candida albicans NOT DETECTED NOT DETECTED Final   Candida auris  NOT DETECTED NOT DETECTED Final   Candida glabrata NOT DETECTED NOT DETECTED Final   Candida krusei NOT DETECTED NOT DETECTED Final   Candida parapsilosis NOT DETECTED NOT DETECTED Final   Candida tropicalis NOT DETECTED NOT DETECTED Final   Cryptococcus neoformans/gattii NOT DETECTED NOT DETECTED Final    Comment: Performed at [facility], [address] [place], [place] [address]  MRSA Next Gen by PCR, Nasal     Status: None   Collection Time: 09/28/21  2:00 PM  Result Value Ref Range Status   MRSA by PCR Next Gen NOT DETECTED NOT DETECTED Final    Comment: (NOTE) The GeneXpert MRSA Assay (FDA approved for NASAL specimens only), is one component of a comprehensive MRSA colonization surveillance program. It is not intended to diagnose MRSA infection nor to guide or monitor treatment for MRSA infections. Test performance is not FDA approved in patients less than 57 years old. Performed at [facility], [address] [place], [place] [address]   CULTURE, BLOOD (ROUTINE X 2) w Reflex to ID Panel     Status: None (Preliminary result)   Collection Time: 09/29/21  1:29 PM   Specimen: BLOOD  Result Value Ref Range Status   Specimen Description BLOOD [facility]  Final   Special Requests BOTTLES DRAWN AEROBIC AND ANAEROBIC BCLV  Final   Culture   Final    NO GROWTH < 24 HOURS Performed at [facility], [address], [place], [place] [address]    Report Status PENDING  Incomplete  CULTURE, BLOOD (ROUTINE X 2) w Reflex to ID Panel     Status: None (Preliminary result)   Collection Time: 09/29/21  3:25 PM   Specimen: BLOOD  Result Value Ref Range Status   Specimen Description BLOOD [facility]  Final   Special Requests BOTTLES DRAWN AEROBIC AND ANAEROBIC BCAV  Final    Culture   Final    NO GROWTH < 24 HOURS Performed at [facility], [address] [place], [place] [address]    Report Status PENDING  Incomplete         Radiology Studies: No results found.      Scheduled Meds:  allopurinol  50 mg Oral Daily   calcitRIOL  0.25 mcg Oral Q M,W,F   clopidogrel  75 mg Oral Daily   dexamethasone  6 mg Oral Daily   docusate sodium  100 mg Oral BID   ferrous sulfate  325 mg Oral BID WC   heparin  5,000 Units Subcutaneous Q8H   isosorbide mononitrate  60 mg Oral Daily   levothyroxine  50 mcg Oral Q0600   raloxifene  60 mg Oral Daily   rosuvastatin  10 mg Oral Daily   sodium bicarbonate  1,300 mg Oral Daily   vancomycin variable dose per unstable renal function (pharmacist dosing)   Does not apply See admin instructions   Continuous Infusions:  cefTRIAXone (ROCEPHIN)  IV Stopped (09/30/21 2156)   dextrose 5 % and 0.2 % NaCl 75 mL/hr at 10/01/21 1409     LOS: 4 days    Time spent: 25 min    Desma Maxim, MD [facility]   If 7PM-7AM, please contact night-coverage [email or web address] Password [facility] 10/01/2021, 3:51 PM

## 2021-10-01 NOTE — Consult Note (Signed)
Consultation Note Date: 10/01/2021   Patient Name: Crystal Faulkner  DOB: [birth year]  MRN: [id number]  Age / Sex: 88 y.o., female  PCP: Steele Sizer, MD Referring Physician: Gwynne Edinger, MD  Reason for Consultation: Establishing goals of care  HPI/Patient Profile: Crystal Faulkner is a 88 y.o. female presenting with AMS and recent covid diagnosis on Monday and since then pt has been declining with mentation and generalized weakness and worsening mental status and somnolent. HPI is difficult as pt is aphasic and cant give history. Pt was noted to have fall today.     Clinical Assessment and Goals of Care:   Patient is aphasic. She had a CVA in 2017. Spoke with daughter Crystal Faulkner. Daughter states there are 6 children total. Patient lives with daughter Crystal Faulkner.   Crystal Faulkner discusses that patient had PNA a couple months ago. She uses a wheelchair and walker. She has been using a walker since the stroke. Crystal Faulkner advises that since PNA her mother has improved, but she did not bounce back the way they had hoped.   She states her mother is continuing to decline. She states patient does not recognize them. She states she does not want her mother to stay on this earth and suffer. She states if her mother can't be healed, she wants her to be at peace.   She would like to meet tomorrow morning, with her sister Crystal Faulkner present. Will discuss care moving forward and hospice.     SUMMARY OF RECOMMENDATIONS   Will meet tomorrow morning with [name] and Crystal Faulkner.   Prognosis:  Poor     Primary Diagnoses: Present on Admission:  Benign hypertension  Adult hypothyroidism  Pneumonia  Acute kidney injury superimposed on chronic kidney disease ([place])  AMS (altered mental status)   I have reviewed the medical record, interviewed the patient and family, and examined the patient. The following aspects are  pertinent.  Past Medical History:  Diagnosis Date   Allergy    Alzheimer's disease ([place])    Anemia    CCF (congestive cardiac failure) (HCC)    Chronic kidney disease    Chronic stable angina (HCC)    Frequent falls    Gout    H/O: GI bleed    Hypercholesteremia    Hyperglycemia    Hyperlipidemia    Hypertension    Incontinence    Mixed dementia (HCC)    Myocardial infarction (HCC)    Numbness of right hand    Osteoporosis    Right hand weakness    Stroke ([place])    Thyroid disease    Vitamin D deficiency    Social History   Socioeconomic History   Marital status: Widowed    Spouse name: Not on file   Number of children: 6   Years of education: Not on file   Highest education level: 8th grade  Occupational History   Occupation: Retired  Tobacco Use   Smoking status: Never   Smokeless tobacco: Former    Types: Snuff  Tobacco comments:    smoking cessation materials not requuired  Vaping Use   Vaping Use: Never used  Substance and Sexual Activity   Alcohol use: No   Drug use: No   Sexual activity: Not Currently  Other Topics Concern   Not on file  Social History Narrative   She lives with her daughter.    She also has her great nephew and niece at home.    Had a stroke October 2017 and has difficulty walking and also unable to talk since.        Social Determinants of Health   Financial Resource Strain: Low Risk    Difficulty of Paying Living Expenses: Not hard at all  Food Insecurity: No Food Insecurity   Worried About Charity fundraiser in the Last Year: Never true   [place] in the Last Year: Never true  Transportation Needs: No Transportation Needs   Lack of Transportation (Medical): No   Lack of Transportation (Non-Medical): No  Physical Activity: Inactive   Days of Exercise per Week: 0 days   Minutes of Exercise per Session: 0 min  Stress: No Stress Concern Present   Feeling of Stress : Not at all  Social Connections: Socially  Isolated   Frequency of Communication with Friends and Family: Never   Frequency of Social Gatherings with Friends and Family: Twice a week   Attends Religious Services: Never   Marine scientist or Organizations: No   Attends Archivist Meetings: Never   Marital Status: Widowed   Family History  Problem Relation Age of Onset   Diabetes Sister    Hypertension Sister    Hypertension Brother    Heart disease Brother    Scheduled Meds:  allopurinol  50 mg Oral Daily   calcitRIOL  0.25 mcg Oral Q M,W,F   clopidogrel  75 mg Oral Daily   dexamethasone  6 mg Oral Daily   docusate sodium  100 mg Oral BID   ferrous sulfate  325 mg Oral BID WC   heparin  5,000 Units Subcutaneous Q8H   isosorbide mononitrate  60 mg Oral Daily   levothyroxine  50 mcg Oral Q0600   raloxifene  60 mg Oral Daily   rosuvastatin  10 mg Oral Daily   sodium bicarbonate  1,300 mg Oral Daily   vancomycin variable dose per unstable renal function (pharmacist dosing)   Does not apply See admin instructions   Continuous Infusions:  cefTRIAXone (ROCEPHIN)  IV Stopped (09/30/21 2156)   dextrose 5 % and 0.2 % NaCl 75 mL/hr at 10/01/21 1409   PRN Meds:.acetaminophen **OR** acetaminophen, albuterol, bisacodyl, guaiFENesin, hydrALAZINE, HYDROcodone-acetaminophen, ondansetron **OR** ondansetron (ZOFRAN) IV, polyethylene glycol Medications Prior to Admission:  Prior to Admission medications   Medication Sig Start Date End Date Taking? Authorizing Provider  acetaminophen (TYLENOL) 500 MG tablet Take 500 mg by mouth every 6 (six) hours as needed.   Yes [provider]  allopurinol (ZYLOPRIM) 100 MG tablet TAKE 1 TABLET BY MOUTH EVERY DAY Patient taking differently: Take 50 mg by mouth daily. 07/10/21  Yes Sowles, Drue Stager, MD  calcitRIOL (ROCALTROL) 0.25 MCG capsule Take 0.25 mcg by mouth as directed. Takes three times a week  mon, wed, and fri. 09/05/19  Yes [provider]  clopidogrel  (PLAVIX) 75 MG tablet TAKE 1 TABLET BY MOUTH EVERY DAY 08/28/20  Yes Sowles, Drue Stager, MD  ferrous sulfate 325 (65 FE) MG tablet Take 325 mg by mouth 2 (  two) times daily with a meal.   Yes [provider]  furosemide (LASIX) 40 MG tablet Take 1 tablet (40 mg total) by mouth every other day. 10/17/18 09/27/21 Yes Gladstone Lighter, MD  guaiFENesin (ROBITUSSIN) 100 MG/5ML liquid Take 5 mLs by mouth every 4 (four) hours as needed for cough or to loosen phlegm. 08/10/21  Yes Nicole Kindred A, DO  isosorbide mononitrate (IMDUR) 60 MG 24 hr tablet TAKE 1 TABLET BY MOUTH EVERY DAY 10/08/20  Yes Sowles, Drue Stager, MD  levothyroxine (SYNTHROID) 50 MCG tablet TAKE 1 TABLET (50 MCG TOTAL) BY MOUTH DAILY AT 6 (SIX) AM. 08/03/21  Yes Sowles, Drue Stager, MD  pantoprazole (PROTONIX) 40 MG tablet TAKE 1 TABLET BY MOUTH EVERY DAY 07/02/21  Yes Sowles, Drue Stager, MD  raloxifene (EVISTA) 60 MG tablet TAKE 1 TABLET BY MOUTH EVERY DAY 08/17/21  Yes Sowles, Drue Stager, MD  rosuvastatin (CRESTOR) 10 MG tablet TAKE 1 TABLET BY MOUTH EVERY DAY 08/28/20  Yes Sowles, Drue Stager, MD  sodium bicarbonate 650 MG tablet Take 650 mg by mouth daily.  03/19/15  Yes [provider]   No Known Allergies Review of Systems  Unable to perform ROS All other systems reviewed and are negative.  Patient is currently on covid precautions. She is aphasic and unable to participate in a [place] conversation.    Vital Signs: BP (!) 132/53 (BP Location: Right Arm)    Pulse (!) 52    Temp 98.2 F (36.8 C)    Resp 17    Ht 5' (1.524 m)    Wt 50.2 kg    SpO2 100%    BMI 21.61 kg/m  Pain Scale: PAINAD       SpO2: SpO2: 100 % O2 Device:SpO2: 100 % O2 Flow Rate: .O2 Flow Rate (L/min): 1 L/min  IO: Intake/output summary:  Intake/Output Summary (Last 24 hours) at 10/01/2021 1522 Last data filed at 10/01/2021 1052 Gross per 24 hour  Intake 615.73 ml  Output 400 ml  Net 215.73 ml    LBM: Last BM Date: 09/29/21 Baseline Weight: Weight: 52 kg Most  recent weight: Weight: 50.2 kg       Time In: 3:00 Time Out: 3:35 Time Total: 35 min Greater than 50%  of this time was spent counseling and coordinating care related to the above assessment and plan.  Signed by: Asencion Gowda, NP   Please contact Palliative Medicine Team phone at [phone] for questions and concerns.  For individual provider: See Shea Evans

## 2021-10-01 NOTE — TOC Progression Note (Addendum)
Transition of Care [facility]) - Progression Note    Patient Details  Name: Crystal Faulkner MRN: [id number] Date of Birth: 08-Oct-1924  Transition of Care [facility]) CM/SW [place], RN Phone Number: 10/01/2021, 10:49 AM  Clinical Narrative:   1791 am, no bed offers at this time TOC to follow  Addendum 1515: no bed offers at this time, toc to follow.  Expected Discharge Plan: Skilled Nursing Facility Barriers to Discharge: Continued Medical Work up  Expected Discharge Plan and Services Expected Discharge Plan: [place]                                               Social Determinants of Health (SDOH) Interventions    Readmission Risk Interventions No flowsheet data found.

## 2021-10-02 DIAGNOSIS — U071 COVID-19: Secondary | ICD-10-CM | POA: Diagnosis not present

## 2021-10-02 DIAGNOSIS — Z7189 Other specified counseling: Secondary | ICD-10-CM | POA: Diagnosis not present

## 2021-10-02 DIAGNOSIS — I2510 Atherosclerotic heart disease of native coronary artery without angina pectoris: Secondary | ICD-10-CM

## 2021-10-02 DIAGNOSIS — N179 Acute kidney failure, unspecified: Secondary | ICD-10-CM | POA: Diagnosis not present

## 2021-10-02 DIAGNOSIS — J1282 Pneumonia due to coronavirus disease 2019: Secondary | ICD-10-CM | POA: Diagnosis not present

## 2021-10-02 LAB — CBC
HCT: 25.3 % — ABNORMAL LOW (ref 36.0–46.0)
Hemoglobin: 7.7 g/dL — ABNORMAL LOW (ref 12.0–15.0)
MCH: 29.8 pg (ref 26.0–34.0)
MCHC: 30.4 g/dL (ref 30.0–36.0)
MCV: 98.1 fL (ref 80.0–100.0)
Platelets: 201 10*3/uL (ref 150–400)
RBC: 2.58 MIL/uL — ABNORMAL LOW (ref 3.87–5.11)
RDW: 15.7 % — ABNORMAL HIGH (ref 11.5–15.5)
WBC: 10.4 10*3/uL (ref 4.0–10.5)
nRBC: 0.2 % (ref 0.0–0.2)

## 2021-10-02 LAB — BASIC METABOLIC PANEL
Anion gap: 6 (ref 5–15)
BUN: 83 mg/dL — ABNORMAL HIGH (ref 8–23)
CO2: 18 mmol/L — ABNORMAL LOW (ref 22–32)
Calcium: 8.4 mg/dL — ABNORMAL LOW (ref 8.9–10.3)
Chloride: 123 mmol/L — ABNORMAL HIGH (ref 98–111)
Creatinine, Ser: 3.42 mg/dL — ABNORMAL HIGH (ref 0.44–1.00)
GFR, Estimated: 12 mL/min — ABNORMAL LOW (ref >60)
Glucose, Bld: 120 mg/dL — ABNORMAL HIGH (ref 70–99)
Potassium: 3.5 mmol/L (ref 3.5–5.1)
Sodium: 147 mmol/L — ABNORMAL HIGH (ref 135–145)

## 2021-10-02 LAB — CULTURE, BLOOD (ROUTINE X 2)

## 2021-10-02 LAB — VANCOMYCIN, RANDOM: Vancomycin Rm: 18

## 2021-10-02 MED ORDER — GLYCOPYRROLATE 1 MG PO TABS
1.0000 mg | ORAL_TABLET | ORAL | Status: DC | PRN
  Filled 2021-10-02: qty 1

## 2021-10-02 MED ORDER — POLYVINYL ALCOHOL 1.4 % OP SOLN
1.0000 [drp] | Freq: Four times a day (QID) | OPHTHALMIC | Status: DC | PRN
  Filled 2021-10-02: qty 15

## 2021-10-02 MED ORDER — LORAZEPAM 2 MG/ML IJ SOLN
1.0000 mg | INTRAMUSCULAR | Status: DC | PRN
  Administered 2021-10-11 (×2): 1 mg via INTRAVENOUS
  Filled 2021-10-02 (×2): qty 1

## 2021-10-02 MED ORDER — BIOTENE DRY MOUTH MT LIQD: 15.0000 mL | OROMUCOSAL | Status: DC | PRN

## 2021-10-02 MED ORDER — GLYCOPYRROLATE 0.2 MG/ML IJ SOLN
0.2000 mg | INTRAMUSCULAR | Status: DC | PRN
  Administered 2021-10-04 – 2021-10-11 (×4): 0.2 mg via INTRAVENOUS
  Filled 2021-10-02 (×7): qty 1

## 2021-10-02 MED ORDER — ACETAMINOPHEN 650 MG RE SUPP: 650.0000 mg | Freq: Four times a day (QID) | RECTAL | Status: DC | PRN

## 2021-10-02 MED ORDER — LORAZEPAM 1 MG PO TABS: 1.0000 mg | ORAL_TABLET | ORAL | Status: DC | PRN

## 2021-10-02 MED ORDER — ONDANSETRON HCL 4 MG/2ML IJ SOLN
4.0000 mg | Freq: Four times a day (QID) | INTRAMUSCULAR | Status: DC | PRN
  Filled 2021-10-02: qty 2

## 2021-10-02 MED ORDER — HYDROMORPHONE HCL 1 MG/ML IJ SOLN
0.5000 mg | INTRAMUSCULAR | Status: DC | PRN
  Administered 2021-10-03 – 2021-10-08 (×4): 0.5 mg via INTRAVENOUS
  Filled 2021-10-02 (×7): qty 1

## 2021-10-02 MED ORDER — HALOPERIDOL LACTATE 5 MG/ML IJ SOLN: 0.5000 mg | INTRAMUSCULAR | Status: DC | PRN

## 2021-10-02 MED ORDER — HALOPERIDOL LACTATE 2 MG/ML PO CONC
0.5000 mg | ORAL | Status: DC | PRN
  Filled 2021-10-02: qty 0.3

## 2021-10-02 MED ORDER — GLYCOPYRROLATE 0.2 MG/ML IJ SOLN: 0.2000 mg | INTRAMUSCULAR | Status: DC | PRN

## 2021-10-02 MED ORDER — LORAZEPAM 2 MG/ML PO CONC: 1.0000 mg | ORAL | Status: DC | PRN

## 2021-10-02 MED ORDER — ONDANSETRON 4 MG PO TBDP: 4.0000 mg | ORAL_TABLET | Freq: Four times a day (QID) | ORAL | Status: DC | PRN

## 2021-10-02 MED ORDER — HALOPERIDOL 0.5 MG PO TABS
0.5000 mg | ORAL_TABLET | ORAL | Status: DC | PRN
  Filled 2021-10-02: qty 1

## 2021-10-02 MED ORDER — ACETAMINOPHEN 325 MG PO TABS: 650.0000 mg | ORAL_TABLET | Freq: Four times a day (QID) | ORAL | Status: DC | PRN

## 2021-10-02 NOTE — Progress Notes (Signed)
°   10/02/21 1620  Clinical Encounter Type  Visited With Family;Patient and family together  Visit Type Initial;Spiritual support;Social support  Referral From Family  Consult/Referral To Chaplain  Spiritual Encounters  Spiritual Needs Emotional;Grief support (prayer)   Daryel November engaged Pt's daughter, [name], with whom she has a working relationship. Chaplain Burris provided compassionate presence and active listening as she shared that Mrs. Herd's health had declined this past week. Chaplain B supported the ways that faith offers meaning and provided comfort for this family. Chapolain Burris also offered hospitality and encouraged self-care in order to sustain through the days ahead. Accompanied daughter to room and there met with granddaughter, Lauro Regulus. Daryel November also engaged Tonia at [employer] to support her because this stirs memories of the loss of her father at this hospital six years ago.  Chaplain Burris offered continued prayer and support; also checked-in with staff to let them know of chaplain's presence through the night. Staff may page or call the on-call chaplain phone directly, [phone].

## 2021-10-02 NOTE — Progress Notes (Signed)
OT Cancellation Note  Patient Details Name: Crystal Faulkner MRN: [id number] DOB: 1924-11-12   Cancelled Treatment:    Reason Eval/Treat Not Completed: Other (comment) Upon chart review, noted that pt transitioned to comfort measures. OT order completed by palliative NP. Will sign off. Thank you for consulting OT in the care of this patient.   Gerrianne Scale, [place], OTR/L ascom [phone] 10/02/21, 4:06 PM

## 2021-10-02 NOTE — TOC Progression Note (Signed)
Transition of Care [facility]) - Progression Note    Patient Details  Name: Crystal Faulkner MRN: [id number] Date of Birth: 03/24/1925  Transition of Care [facility]) CM/SW [place], RN Phone Number: 10/02/2021, 9:51 AM  Clinical Narrative:  Family has meeting with Crystal from Palliative today, time undetermined.  TOC will follow for needs and potential SNF placement.     Expected Discharge Plan: Skilled Nursing Facility Barriers to Discharge: Continued Medical Work up  Expected Discharge Plan and Services Expected Discharge Plan: [place]                                               Social Determinants of Health (SDOH) Interventions    Readmission Risk Interventions No flowsheet data found.

## 2021-10-02 NOTE — Progress Notes (Signed)
Nutrition Brief Note  Chart reviewed. Pt now transitioning to comfort care.  No further nutrition interventions planned at this time.  Please re-consult as needed.   Callan Yontz W, RD, LDN, CDCES Registered Dietitian II Certified Diabetes Care and Education Specialist Please refer to AMION for RD and/or RD on-call/weekend/after hours pager   

## 2021-10-02 NOTE — Progress Notes (Signed)
Physical Therapy Discharge Patient Details Name: Crystal Faulkner MRN: [id number] DOB: 10-Sep-1924 Today's Date: 10/02/2021 Time:  -     Patient discharged from PT services secondary to pt going comfort measures.Will sign off per orders.  Please see latest therapy progress note for current level of functioning and progress toward goals.     Julaine Fusi PTA 10/02/21, 1:00 PM

## 2021-10-02 NOTE — TOC Progression Note (Signed)
Transition of Care [facility]) - Progression Note    Patient Details  Name: Crystal Faulkner MRN: [id number] Date of Birth: 02/10/1925  Transition of Care [facility]) CM/SW [place], RN Phone Number: 10/02/2021, 1:54 PM  Clinical Narrative:   Patient and family spoke with Palliative and decided on comfort care only.  Patient is COVID + and will not be able to transfer to hospice house until after isolation, on 2/14 (tested positive 02/03).     Expected Discharge Plan: Skilled Nursing Facility Barriers to Discharge: Continued Medical Work up  Expected Discharge Plan and Services Expected Discharge Plan: [place]                                               Social Determinants of Health (SDOH) Interventions    Readmission Risk Interventions No flowsheet data found.

## 2021-10-02 NOTE — Progress Notes (Signed)
PROGRESS NOTE    Crystal Faulkner  MBW:[id number] DOB: [birth year] DOA: 09/27/2021 PCP: Steele Sizer, MD    Brief Narrative:  Crystal Faulkner is a 88 y.o. female presenting with AMS and recent covid diagnosis on Monday and since then pt has been declining with mentation and generalized weakness and worsening mental status and somnolent.. She has a very poor appetite, not to be eating.  Transition to comfort care 2/8.   Assessment & Plan:   Principal Problem:   Pneumonia Active Problems:   Coronary artery disease   Benign hypertension   Adult hypothyroidism   Acute kidney injury superimposed on chronic kidney disease (HCC)   AMS (altered mental status)   History of CVA in adulthood Pneumonia. Community-acquired pneumonia. Staphylococcus capitis bacteremia. Acute hypoxemic restaurant failure Hyponatremia. Failure to thrive. Acute kidney injury superimposed on chronic kidney disease stage V. Anemia of chronic kidney disease. Coronary artery disease Chronic diastolic congestive heart failure. Hypothyroidism.  After discussion with the family, decision was made to transfer to comfort care.  Patient most likely will die in the hospital before quarantine is completed.       I/O last 3 completed shifts: In: 753.3 [I.V.:553.3; IV Piggyback:200] Out: 700 [Urine:700] Total I/O In: 1607.2 [I.V.:1507.2; IV Piggyback:100] Out: 600 [Urine:600]     Subjective: Patient is nonverbal, opening eyes only. No short of breath.  Objective: Vitals:   10/01/21 2114 10/02/21 0406 10/02/21 0830 10/02/21 1156  BP: 136/75 137/67 (!) 146/78 127/80  Pulse: 81 72 66 64  Resp: 18 16 15 18   Temp: 98 F (36.7 C) (!) 97.2 F (36.2 C) 97.6 F (36.4 C) (!) 97 F (36.1 C)  TempSrc: Oral Oral Oral Axillary  SpO2: 98% 99% 99% 100%  Weight:      Height:        Intake/Output Summary (Last 24 hours) at 10/02/2021 1314 Last data filed at 10/02/2021 1236 Gross per 24 hour  Intake 1744.72 ml   Output 900 ml  Net 844.72 ml   Filed Weights   09/27/21 1846 09/30/21 2100  Weight: 52 kg 50.2 kg    Examination:  General exam: Appears calm and comfortable  Respiratory system: Clear to auscultation. Respiratory effort normal. Cardiovascular system: S1 & S2 heard, RRR. No JVD, murmurs, rubs, gallops or clicks. No pedal edema. Gastrointestinal system: Abdomen is nondistended, soft and nontender. No organomegaly or masses felt. Normal bowel sounds heard. Central nervous system: Alert and nonverbal,. No focal neurological deficits. Extremities: Symmetric 5 x 5 power. Skin: No rashes, lesions or ulcers     Data Reviewed: I have personally reviewed following labs and imaging studies  CBC: Recent Labs  Lab 09/27/21 1844 09/28/21 0728 09/28/21 1453 09/29/21 0101 09/29/21 0177 09/30/21 0528 10/01/21 0501 10/02/21 0634  WBC 7.4 6.3  --  8.1  --  11.3* 10.7* 10.4  NEUTROABS 5.4  --   --   --   --   --   --   --   HGB 8.2* 7.4*   < > 7.8* 8.2* 7.4* 7.1* 7.7*  HCT 27.2* 24.4*   < > 25.3* 26.5* 23.5* 23.5* 25.3*  MCV 102.6* 103.0*  --  98.4  --  97.5 100.9* 98.1  PLT 182 190  --  219  --  210 219 201   < > = values in this interval not displayed.   Basic Metabolic Panel: Recent Labs  Lab 09/28/21 0728 09/29/21 0101 09/30/21 0528 10/01/21 0501 10/02/21 0634  NA 143 146*  151* 151* 147*  K 4.3 4.7 4.1 3.9 3.5  CL 115* 119* 125* 126* 123*  CO2 17* 18* 19* 18* 18*  GLUCOSE 81 157* 138* 161* 120*  BUN 60* 66* 84* 86* 83*  CREATININE 4.08* 4.17* 4.20* 3.79* 3.42*  CALCIUM 7.8* 8.1* 8.4* 8.5* 8.4*   GFR: Estimated Creatinine Clearance: 7.2 mL/min (A) (by C-G formula based on SCr of 3.42 mg/dL (H)). Liver Function Tests: Recent Labs  Lab 09/27/21 1844  AST 37  ALT 19  ALKPHOS 48  BILITOT 0.4  PROT 6.1*  ALBUMIN 2.4*   No results for input(s): LIPASE, AMYLASE in the last 168 hours. No results for input(s): AMMONIA in the last 168 hours. Coagulation  Profile: Recent Labs  Lab 09/27/21 1844  INR 1.2   Cardiac Enzymes: No results for input(s): CKTOTAL, CKMB, CKMBINDEX, TROPONINI in the last 168 hours. BNP (last 3 results) No results for input(s): PROBNP in the last 8760 hours. HbA1C: No results for input(s): HGBA1C in the last 72 hours. CBG: No results for input(s): GLUCAP in the last 168 hours. Lipid Profile: No results for input(s): CHOL, HDL, LDLCALC, TRIG, CHOLHDL, LDLDIRECT in the last 72 hours. Thyroid Function Tests: No results for input(s): TSH, T4TOTAL, FREET4, T3FREE, THYROIDAB in the last 72 hours. Anemia Panel: No results for input(s): VITAMINB12, FOLATE, FERRITIN, TIBC, IRON, RETICCTPCT in the last 72 hours. Sepsis Labs: Recent Labs  Lab 09/27/21 1844 09/27/21 2050  PROCALCITON 26.31  --   LATICACIDVEN 1.7 1.1    Recent Results (from the past 240 hour(s))  Resp Panel by RT-PCR (Flu A&B, Covid) Nasopharyngeal Swab     Status: Abnormal   Collection Time: 09/27/21  7:33 PM   Specimen: Nasopharyngeal Swab; Nasopharyngeal(NP) swabs in vial transport medium  Result Value Ref Range Status   SARS Coronavirus 2 by RT PCR POSITIVE (A) NEGATIVE Final    Comment: (NOTE) SARS-CoV-2 target nucleic acids are DETECTED.  The SARS-CoV-2 RNA is generally detectable in upper respiratory specimens during the acute phase of infection. Positive results are indicative of the presence of the identified virus, but do not rule out bacterial infection or co-infection with other pathogens not detected by the test. Clinical correlation with patient history and other diagnostic information is necessary to determine patient infection status. The expected result is Negative.  Fact Sheet for Patients: EntrepreneurPulse.com.au  Fact Sheet for Healthcare Providers: IncredibleEmployment.be  This test is not yet approved or cleared by the [place] FDA and  has been authorized for detection and/or  diagnosis of SARS-CoV-2 by FDA under an Emergency Use Authorization (EUA).  This EUA will remain in effect (meaning this test can be used) for the duration of  the COVID-19 declaration under Section 564(b)(1) of the A ct, 21 U.S.C. section 360bbb-3(b)(1), unless the authorization is terminated or revoked sooner.     Influenza A by PCR NEGATIVE NEGATIVE Final   Influenza B by PCR NEGATIVE NEGATIVE Final    Comment: (NOTE) The Xpert Xpress SARS-CoV-2/FLU/RSV plus assay is intended as an aid in the diagnosis of influenza from Nasopharyngeal swab specimens and should not be used as a sole basis for treatment. Nasal washings and aspirates are unacceptable for Xpert Xpress SARS-CoV-2/FLU/RSV testing.  Fact Sheet for Patients: EntrepreneurPulse.com.au  Fact Sheet for Healthcare Providers: IncredibleEmployment.be  This test is not yet approved or cleared by the [place] FDA and has been authorized for detection and/or diagnosis of SARS-CoV-2 by FDA under an Emergency Use Authorization (EUA). This EUA will remain  in effect (meaning this test can be used) for the duration of the COVID-19 declaration under Section 564(b)(1) of the Act, 21 U.S.C. section 360bbb-3(b)(1), unless the authorization is terminated or revoked.  Performed at [facility], [address] [place], [place] [address]   Blood culture (routine x 2)     Status: Abnormal   Collection Time: 09/27/21  7:33 PM   Specimen: Left Antecubital; Blood  Result Value Ref Range Status   Specimen Description   Final    LEFT ANTECUBITAL Performed at [facility], [address] [place], [place] [address]    Special Requests   Final    BOTTLES DRAWN AEROBIC AND ANAEROBIC Blood Culture results may not be optimal due to an inadequate volume of blood received in culture bottles Performed at [facility], [address], [place], [place] [address]    Culture   Setup Time   Final    GRAM POSITIVE COCCI IN BOTH AEROBIC AND ANAEROBIC BOTTLES CRITICAL RESULT CALLED TO, READ BACK BY AND VERIFIED WITH: SUSAN WATSON @1432  09/28/21 SCS    Culture STAPHYLOCOCCUS CAPITIS (A)  Final   Report Status 10/01/2021 FINAL  Final   Organism ID, Bacteria STAPHYLOCOCCUS CAPITIS  Final      Susceptibility   Staphylococcus capitis - MIC*    CIPROFLOXACIN <=0.5 SENSITIVE Sensitive     ERYTHROMYCIN 0.5 SENSITIVE Sensitive     GENTAMICIN <=0.5 SENSITIVE Sensitive     OXACILLIN RESISTANT Resistant     TETRACYCLINE <=1 SENSITIVE Sensitive     VANCOMYCIN <=0.5 SENSITIVE Sensitive     TRIMETH/SULFA <=10 SENSITIVE Sensitive     CLINDAMYCIN <=0.25 SENSITIVE Sensitive     RIFAMPIN <=0.5 SENSITIVE Sensitive     Inducible Clindamycin NEGATIVE Sensitive     * STAPHYLOCOCCUS CAPITIS  Blood culture (routine x 2)     Status: Abnormal   Collection Time: 09/27/21  7:33 PM   Specimen: Right Antecubital; Blood  Result Value Ref Range Status   Specimen Description   Final    RIGHT ANTECUBITAL Performed at [facility], [address] [place], [place] [address]    Special Requests   Final    BOTTLES DRAWN AEROBIC AND ANAEROBIC Blood Culture results may not be optimal due to an inadequate volume of blood received in culture bottles Performed at [facility], [address] [place], [place] [address]    Culture  Setup Time   Final    IN BOTH AEROBIC AND ANAEROBIC BOTTLES GRAM POSITIVE COCCI CRITICAL VALUE NOTED.  VALUE IS CONSISTENT WITH PREVIOUSLY REPORTED AND CALLED VALUE. SCS 09/28/21     Culture (A)  Final    STAPHYLOCOCCUS CAPITIS SUSCEPTIBILITIES PERFORMED ON PREVIOUS CULTURE WITHIN THE LAST 5 DAYS. Performed at [facility], [place] [address], [place],  [address]    Report Status 10/02/2021 FINAL  Final  Blood Culture ID Panel (Reflexed)     Status: Abnormal   Collection Time: 09/27/21  7:33 PM  Result Value Ref Range Status    Enterococcus faecalis NOT DETECTED NOT DETECTED Final   Enterococcus Faecium NOT DETECTED NOT DETECTED Final   Listeria monocytogenes NOT DETECTED NOT DETECTED Final   Staphylococcus species DETECTED (A) NOT DETECTED Final    Comment: CRITICAL RESULT CALLED TO, READ BACK BY AND VERIFIED WITH: SUSAN WATSON @1432  09/28/21 SCS    Staphylococcus aureus (BCID) NOT DETECTED NOT DETECTED Final   Staphylococcus epidermidis NOT DETECTED NOT DETECTED Final   Staphylococcus lugdunensis NOT  DETECTED NOT DETECTED Final   Streptococcus species NOT DETECTED NOT DETECTED Final   Streptococcus agalactiae NOT DETECTED NOT DETECTED Final   Streptococcus pneumoniae NOT DETECTED NOT DETECTED Final   Streptococcus pyogenes NOT DETECTED NOT DETECTED Final   A.calcoaceticus-baumannii NOT DETECTED NOT DETECTED Final   Bacteroides fragilis NOT DETECTED NOT DETECTED Final   Enterobacterales NOT DETECTED NOT DETECTED Final   Enterobacter cloacae complex NOT DETECTED NOT DETECTED Final   Escherichia coli NOT DETECTED NOT DETECTED Final   Klebsiella aerogenes NOT DETECTED NOT DETECTED Final   Klebsiella oxytoca NOT DETECTED NOT DETECTED Final   Klebsiella pneumoniae NOT DETECTED NOT DETECTED Final   Proteus species NOT DETECTED NOT DETECTED Final   Salmonella species NOT DETECTED NOT DETECTED Final   Serratia marcescens NOT DETECTED NOT DETECTED Final   Haemophilus influenzae NOT DETECTED NOT DETECTED Final   Neisseria meningitidis NOT DETECTED NOT DETECTED Final   Pseudomonas aeruginosa NOT DETECTED NOT DETECTED Final   Stenotrophomonas maltophilia NOT DETECTED NOT DETECTED Final   Candida albicans NOT DETECTED NOT DETECTED Final   Candida auris NOT DETECTED NOT DETECTED Final   Candida glabrata NOT DETECTED NOT DETECTED Final   Candida krusei NOT DETECTED NOT DETECTED Final   Candida parapsilosis NOT DETECTED NOT DETECTED Final   Candida tropicalis NOT DETECTED NOT DETECTED Final   Cryptococcus  neoformans/gattii NOT DETECTED NOT DETECTED Final    Comment: Performed at [facility], [address] [place], [place] [address]  MRSA Next Gen by PCR, Nasal     Status: None   Collection Time: 09/28/21  2:00 PM  Result Value Ref Range Status   MRSA by PCR Next Gen NOT DETECTED NOT DETECTED Final    Comment: (NOTE) The GeneXpert MRSA Assay (FDA approved for NASAL specimens only), is one component of a comprehensive MRSA colonization surveillance program. It is not intended to diagnose MRSA infection nor to guide or monitor treatment for MRSA infections. Test performance is not FDA approved in patients less than 44 years old. Performed at [facility], [address] [place], [place] [address]   CULTURE, BLOOD (ROUTINE X 2) w Reflex to ID Panel     Status: None (Preliminary result)   Collection Time: 09/29/21  1:29 PM   Specimen: BLOOD  Result Value Ref Range Status   Specimen Description BLOOD [facility]  Final   Special Requests BOTTLES DRAWN AEROBIC AND ANAEROBIC BCLV  Final   Culture   Final    NO GROWTH 3 DAYS Performed at [facility], [address] [place], [place] [address]    Report Status PENDING  Incomplete  CULTURE, BLOOD (ROUTINE X 2) w Reflex to ID Panel     Status: None (Preliminary result)   Collection Time: 09/29/21  3:25 PM   Specimen: BLOOD  Result Value Ref Range Status   Specimen Description BLOOD [facility]  Final   Special Requests BOTTLES DRAWN AEROBIC AND ANAEROBIC BCAV  Final   Culture   Final    NO GROWTH 3 DAYS Performed at [facility], [address] [place], [place] [address]    Report Status PENDING  Incomplete         Radiology Studies: No results found.      Scheduled Meds:  dexamethasone  6 mg Oral Daily   Continuous Infusions:   LOS: 5 days    Time spent: 27 minutes    Sharen Hones, MD [facility]   To contact the attending provider between  7A-7P or the covering provider during  after hours 7P-7A, please log into the web site [email or web address] and access using universal [place] password for that web site. If you do not have the password, please call the hospital operator.  10/02/2021, 1:14 PM

## 2021-10-02 NOTE — Progress Notes (Addendum)
Daily Progress Note   Patient Name: Crystal Faulkner       Date: 10/02/2021 DOB: 13-Jan-1925  Age: 88 y.o. MRN#: [id number] Attending Physician: Sharen Hones, MD Primary Care Physician: Steele Sizer, MD Admit Date: 09/27/2021  Reason for Consultation/Follow-up: Establishing goals of care  Subjective: Patient is on covid precautions. Met with daughters Crystal Faulkner and Crystal Faulkner who are patient's HPOA's in meeting room. Patient is resting in bed, no distress noted upon my assessment. Several of patient's children are at bedside.    Crystal Faulkner states she and her mother have been living together since the 1970's as patient's husband died in the 66's.  She discusses her mother's recent PNA. The two agree that she never "bounced back" from that, and remained more weak and frail.   We discussed her diagnoses, poor prognosis, GOC, EOL wishes disposition and options.  Created space and opportunity for patient  to explore thoughts and feelings regarding current medical information. They state with this admission they have noticed decline. They state they have also noticed that she is only eating and drinking bites and sips.   A detailed discussion was had today regarding advanced directives.  Concepts specific to code status, artifical feeding and hydration, IV antibiotics and rehospitalization were discussed.  The difference between an aggressive medical intervention path and a comfort care path was discussed.  Values and goals of care important to patient and family were attempted to be elicited.  Discussed limitations of medical interventions to prolong quality of life in some situations and discussed the concept of human mortality.  They both agree they do not want to see her suffer any longer. They agree  they would like to shift to full comfort care. They request hospice facility placement. They are aware she may become too unstable for transport, and she may die here in the hospital. They would like to focus soley on her comfort and dignity, and stop all life prolonging care. They feel she has lived a good long life and is a woman of faith who will go to heaven to be with Jesus.   I completed a MOST form today with Crystal Faulkner and Crystal Faulkner who are daughters and HPOAs. The signed original was placed in the chart. A photocopy was placed in the chart to be scanned into EMR. A copy was  to Crystal Faulkner. The patient/ family outlined their wishes for the following treatment decisions: ° °Cardiopulmonary Resuscitation: Do Not Attempt Resuscitation (DNR/No CPR)  °Medical Interventions: Comfort Measures: Keep clean, warm, and dry. Use medication by any route, positioning, wound care, and other measures to relieve pain and suffering. Use oxygen, suction and manual treatment of airway obstruction as needed for comfort. Do not transfer to the hospital unless comfort needs cannot be met in current location.  °Antibiotics: No antibiotics (use other measures to relieve symptoms)  °IV Fluids: No IV fluids (provide other measures to ensure comfort)  °Feeding Tube: No feeding tube  °  ° °Length of Stay: 5 ° °Current Medications: °Scheduled Meds:  ° allopurinol  50 mg Oral Daily  ° calcitRIOL  0.25 mcg Oral Q M,W,F  ° clopidogrel  75 mg Oral Daily  ° dexamethasone  6 mg Oral Daily  ° docusate sodium  100 mg Oral BID  ° ferrous sulfate  325 mg Oral BID WC  ° heparin  5,000 Units Subcutaneous Q8H  ° isosorbide mononitrate  60 mg Oral Daily  ° levothyroxine  50 mcg Oral Q0600  ° raloxifene  60 mg Oral Daily  ° rosuvastatin  10 mg Oral Daily  ° sodium bicarbonate  1,300 mg Oral Daily  ° vancomycin variable dose per unstable renal function (pharmacist dosing)   Does not apply See admin instructions  ° ° °Continuous Infusions: ° dextrose 5 %  and 0.2 % NaCl 75 mL/hr at 10/02/21 0149  ° ° °PRN Meds: °acetaminophen **OR** acetaminophen, albuterol, bisacodyl, guaiFENesin, hydrALAZINE, HYDROcodone-acetaminophen, ondansetron **OR** ondansetron (ZOFRAN) IV, polyethylene glycol ° °Physical Exam °Constitutional:   °   Comments: Eyes closed.   °Pulmonary:  °   Effort: Pulmonary effort is normal.  °         ° °Vital Signs: BP 127/80 (BP Location: Left Leg)    Pulse 64    Temp (!) 97 °F (36.1 °C) (Axillary)    Resp 18    Ht 5' (1.524 m)    Wt 50.2 kg    SpO2 100%    BMI 21.61 kg/m²  °SpO2: SpO2: 100 % °O2 Device: O2 Device: Room Air °O2 Flow Rate: O2 Flow Rate (L/min): 1 L/min ° °Intake/output summary:  °Intake/Output Summary (Last 24 hours) at 10/02/2021 1235 °Last data filed at 10/02/2021 1200 °Gross per 24 hour  °Intake 237.55 ml  °Output 900 ml  °Net -662.45 ml  ° °LBM: Last BM Date: 09/29/21 °Baseline Weight: Weight: 52 kg °Most recent weight: Weight: 50.2 kg ° °     ° ° °Patient Active Problem List  ° Diagnosis Date Noted  ° History of CVA in adulthood 09/28/2021  ° Pneumonia due to COVID-19 virus 09/27/2021  ° Acute kidney injury superimposed on chronic kidney disease (HCC) 09/27/2021  ° AMS (altered mental status) 09/27/2021  ° Acute on chronic anemia 08/09/2021  ° Pneumonia 08/07/2021  ° Sepsis (HCC) 10/15/2018  ° Osteoarthritis 11/19/2017  ° Dysarthria due to recent cerebrovascular accident (CVA) 06/23/2016  ° Hemiparesis affecting right side as late effect of cerebrovascular accident (CVA) (HCC) 06/23/2016  ° CKD (chronic kidney disease) stage 5, GFR less than 15 ml/min (HCC) 06/23/2016  ° Senile purpura (HCC) 05/13/2016  ° Hiatal hernia 05/04/2016  ° Thoracic aorta atherosclerosis (HCC) 05/04/2016  ° Chronic stable angina (HCC) 10/16/2015  ° Secondary hyperparathyroidism (HCC) 04/13/2015  ° Coronary artery disease 04/08/2015  ° Benign hypertension 04/08/2015  ° Controlled gout 04/08/2015  ° History of peptic   peptic ulcer disease 04/08/2015   Adult hypothyroidism  04/08/2015   Mild cognitive disorder 04/08/2015   Anemia of chronic disease 04/08/2015   Osteoarthrosis involving more than one site but not generalized 04/08/2015   Osteoporosis 04/08/2015   Pelvic muscle wasting 04/08/2015   Allergic rhinitis 04/08/2015   Chronic diastolic CHF (congestive heart failure) ([place]) 03/29/2014   HLD (hyperlipidemia) 03/29/2014   H/O gastrointestinal hemorrhage 04/05/2007    Palliative Care Assessment & Plan    Recommendations/Plan: Goals set. Shift to comfort care. Orders placed for symptom management; please use them as needed for patient's comfort. Primary team, please modify orders/medications if needed for better symptom management.   Family would like hospice facility bed.  Family is aware she may be too unstable to transfer to hospice when she is eligible to go/has a bed available. They are aware she may die here in th hospital.   Code Status:    Code Status Orders  (From admission, onward)           Start     Ordered   09/27/21 2203  Do not attempt resuscitation (DNR)  Continuous       Question Answer Comment  In the event of cardiac or respiratory ARREST Do not call a code blue   In the event of cardiac or respiratory ARREST Do not perform Intubation, CPR, defibrillation or ACLS   In the event of cardiac or respiratory ARREST Use medication by any route, position, wound care, and other measures to relive pain and suffering. May use oxygen, suction and manual treatment of airway obstruction as needed for comfort.      09/27/21 2215           Code Status History     Date Active Date Inactive Code Status Order ID Comments User Context   08/07/2021 2105 08/10/2021 1636 DNR [id number]  Cox, Amy Delane Ginger, DO ED   08/07/2021 2105 08/07/2021 2105 Full Code [id number]  Cox, Amy Delane Ginger, DO ED   10/16/2018 0013 10/17/2018 2001 DNR [id number]  Lance Coon, MD Inpatient   02/27/2017 0836 02/28/2017 1453 DNR [id number]  Fritzi Mandes, MD Inpatient   02/26/2017  2049 02/27/2017 0836 Full Code [id number]  Dustin Flock, MD Inpatient   06/22/2016 0125 06/23/2016 1825 Full Code [id number]  Hugelmeyer, Ubaldo Glassing, DO Inpatient      Advance Directive Documentation    Flowsheet Row Most Recent Value  Type of Advance Directive Healthcare Power of Attorney, Living will  Pre-existing out of facility DNR order (yellow form or pink MOST form) --  "MOST" Form in Place? --       Prognosis:  < 2 weeks   Care plan was discussed with MD, TOC  Thank you for allowing the Palliative Medicine Team to assist in the care of this patient.   Time In: 11:15 Time Out: 12:05 Total Time 50 min Prolonged Time Billed  no       Greater than 50%  of this time was spent counseling and coordinating care related to the above assessment and plan.  Asencion Gowda, NP  Please contact Palliative Medicine Team phone at [phone] for questions and concerns.

## 2021-10-03 DIAGNOSIS — J1282 Pneumonia due to coronavirus disease 2019: Secondary | ICD-10-CM | POA: Diagnosis not present

## 2021-10-03 DIAGNOSIS — N189 Chronic kidney disease, unspecified: Secondary | ICD-10-CM | POA: Diagnosis not present

## 2021-10-03 DIAGNOSIS — N179 Acute kidney failure, unspecified: Secondary | ICD-10-CM | POA: Diagnosis not present

## 2021-10-03 DIAGNOSIS — J9601 Acute respiratory failure with hypoxia: Secondary | ICD-10-CM

## 2021-10-03 DIAGNOSIS — U071 COVID-19: Secondary | ICD-10-CM | POA: Diagnosis not present

## 2021-10-03 NOTE — Care Management Important Message (Signed)
Important Message  Patient Details  Name: Crystal Faulkner MRN: [id number] Date of Birth: 11-10-1924   Medicare Important Message Given:  Other (see comment)  Patient is on comfort care with plans to discharge to [facility] 2/14 after C+ quarantine. Out of respect to the patient and family no Important Message from [facility] given.   [name] 10/03/2021, 7:52 AM

## 2021-10-03 NOTE — Progress Notes (Addendum)
PROGRESS NOTE    Crystal Faulkner  OVZ:[id number] DOB: 1924/10/22 DOA: 09/27/2021 PCP: Steele Sizer, MD    Brief Narrative:  Crystal Faulkner is a 88 y.o. female presenting with AMS and recent covid diagnosis on Monday and since then pt has been declining with mentation and generalized weakness and worsening mental status and somnolent.. She has a very poor appetite, not to be eating.  Transition to comfort care 2/8.   Assessment & Plan:   Principal Problem:   Pneumonia Active Problems:   Coronary artery disease   Benign hypertension   Adult hypothyroidism   Acute kidney injury superimposed on chronic kidney disease (HCC)   AMS (altered mental status)   History of CVA in adulthood  Covid pneumonia. Community-acquired pneumonia. Staphylococcus capitis bacteremia. Acute hypoxemic respiratory failure Hyponatremia. Failure to thrive. Acute kidney injury superimposed on chronic kidney disease stage V. Anemia of chronic kidney disease. Coronary artery disease Chronic diastolic congestive heart failure. Hypothyroidism.  Patient is able to eat a small amount of diet.  She is comfortable with the as needed pain medicine. Continue comfort care measures.  Will transfer to inpatient hospice when COVID quarantine expires.    I/O last 3 completed shifts: In: 1607.2 [I.V.:1507.2; IV Piggyback:100] Out: 1300 [Urine:1300] No intake/output data recorded.    Subjective: Spoke with the patient family, patient was able to eat.  She is not constipated.  She had a bowel movement about 2 days ago.  Not short of breath.  Objective: Vitals:   10/02/21 0406 10/02/21 0830 10/02/21 1156 10/03/21 0443  BP: 137/67 (!) 146/78 127/80 131/84  Pulse: 72 66 64 98  Resp: 16 15 18 17   Temp: (!) 97.2 F (36.2 C) 97.6 F (36.4 C) (!) 97 F (36.1 C) 98 F (36.7 C)  TempSrc: Oral Oral Axillary   SpO2: 99% 99% 100% 96%  Weight:      Height:        Intake/Output Summary (Last 24 hours) at  10/03/2021 0940 Last data filed at 10/03/2021 0443 Gross per 24 hour  Intake 1507.17 ml  Output 1200 ml  Net 307.17 ml   Filed Weights   09/27/21 1846 09/30/21 2100  Weight: 52 kg 50.2 kg    Examination:  General exam: Appears calm and comfortable  Respiratory system: Clear to auscultation. Respiratory effort normal. Cardiovascular system: S1 & S2 heard, RRR. No JVD, murmurs, rubs, gallops or clicks. No pedal edema. Gastrointestinal system: Abdomen is nondistended, soft and nontender. No organomegaly or masses felt. Normal bowel sounds heard. Central nervous system: Alert and confused. Extremities: Symmetric 5 x 5 power. Skin: No rashes, lesions or ulcers .     Data Reviewed: I have personally reviewed following labs and imaging studies  CBC: Recent Labs  Lab 09/27/21 1844 09/28/21 0728 09/28/21 1453 09/29/21 0101 09/29/21 4128 09/30/21 0528 10/01/21 0501 10/02/21 0634  WBC 7.4 6.3  --  8.1  --  11.3* 10.7* 10.4  NEUTROABS 5.4  --   --   --   --   --   --   --   HGB 8.2* 7.4*   < > 7.8* 8.2* 7.4* 7.1* 7.7*  HCT 27.2* 24.4*   < > 25.3* 26.5* 23.5* 23.5* 25.3*  MCV 102.6* 103.0*  --  98.4  --  97.5 100.9* 98.1  PLT 182 190  --  219  --  210 219 201   < > = values in this interval not displayed.   Basic Metabolic Panel: Recent  Labs  Lab 09/28/21 0728 09/29/21 0101 09/30/21 0528 10/01/21 0501 10/02/21 0634  NA 143 146* 151* 151* 147*  K 4.3 4.7 4.1 3.9 3.5  CL 115* 119* 125* 126* 123*  CO2 17* 18* 19* 18* 18*  GLUCOSE 81 157* 138* 161* 120*  BUN 60* 66* 84* 86* 83*  CREATININE 4.08* 4.17* 4.20* 3.79* 3.42*  CALCIUM 7.8* 8.1* 8.4* 8.5* 8.4*   GFR: Estimated Creatinine Clearance: 7.2 mL/min (A) (by C-G formula based on SCr of 3.42 mg/dL (H)). Liver Function Tests: Recent Labs  Lab 09/27/21 1844  AST 37  ALT 19  ALKPHOS 48  BILITOT 0.4  PROT 6.1*  ALBUMIN 2.4*   No results for input(s): LIPASE, AMYLASE in the last 168 hours. No results for input(s):  AMMONIA in the last 168 hours. Coagulation Profile: Recent Labs  Lab 09/27/21 1844  INR 1.2   Cardiac Enzymes: No results for input(s): CKTOTAL, CKMB, CKMBINDEX, TROPONINI in the last 168 hours. BNP (last 3 results) No results for input(s): PROBNP in the last 8760 hours. HbA1C: No results for input(s): HGBA1C in the last 72 hours. CBG: No results for input(s): GLUCAP in the last 168 hours. Lipid Profile: No results for input(s): CHOL, HDL, LDLCALC, TRIG, CHOLHDL, LDLDIRECT in the last 72 hours. Thyroid Function Tests: No results for input(s): TSH, T4TOTAL, FREET4, T3FREE, THYROIDAB in the last 72 hours. Anemia Panel: No results for input(s): VITAMINB12, FOLATE, FERRITIN, TIBC, IRON, RETICCTPCT in the last 72 hours. Sepsis Labs: Recent Labs  Lab 09/27/21 1844 09/27/21 2050  PROCALCITON 26.31  --   LATICACIDVEN 1.7 1.1    Recent Results (from the past 240 hour(s))  Resp Panel by RT-PCR (Flu A&B, Covid) Nasopharyngeal Swab     Status: Abnormal   Collection Time: 09/27/21  7:33 PM   Specimen: Nasopharyngeal Swab; Nasopharyngeal(NP) swabs in vial transport medium  Result Value Ref Range Status   SARS Coronavirus 2 by RT PCR POSITIVE (A) NEGATIVE Final    Comment: (NOTE) SARS-CoV-2 target nucleic acids are DETECTED.  The SARS-CoV-2 RNA is generally detectable in upper respiratory specimens during the acute phase of infection. Positive results are indicative of the presence of the identified virus, but do not rule out bacterial infection or co-infection with other pathogens not detected by the test. Clinical correlation with patient history and other diagnostic information is necessary to determine patient infection status. The expected result is Negative.  Fact Sheet for Patients: EntrepreneurPulse.com.au  Fact Sheet for Healthcare Providers: IncredibleEmployment.be  This test is not yet approved or cleared by the [place] FDA and   has been authorized for detection and/or diagnosis of SARS-CoV-2 by FDA under an Emergency Use Authorization (EUA).  This EUA will remain in effect (meaning this test can be used) for the duration of  the COVID-19 declaration under Section 564(b)(1) of the A ct, 21 U.S.C. section 360bbb-3(b)(1), unless the authorization is terminated or revoked sooner.     Influenza A by PCR NEGATIVE NEGATIVE Final   Influenza B by PCR NEGATIVE NEGATIVE Final    Comment: (NOTE) The Xpert Xpress SARS-CoV-2/FLU/RSV plus assay is intended as an aid in the diagnosis of influenza from Nasopharyngeal swab specimens and should not be used as a sole basis for treatment. Nasal washings and aspirates are unacceptable for Xpert Xpress SARS-CoV-2/FLU/RSV testing.  Fact Sheet for Patients: EntrepreneurPulse.com.au  Fact Sheet for Healthcare Providers: IncredibleEmployment.be  This test is not yet approved or cleared by the [place] and has been authorized for  detection and/or diagnosis of SARS-CoV-2 by FDA under an Emergency Use Authorization (EUA). This EUA will remain in effect (meaning this test can be used) for the duration of the COVID-19 declaration under Section 564(b)(1) of the Act, 21 U.S.C. section 360bbb-3(b)(1), unless the authorization is terminated or revoked.  Performed at [facility], [address] [place], [place] [address]   Blood culture (routine x 2)     Status: Abnormal   Collection Time: 09/27/21  7:33 PM   Specimen: Left Antecubital; Blood  Result Value Ref Range Status   Specimen Description   Final    LEFT ANTECUBITAL Performed at [facility], [address] [place], [place] [address]    Special Requests   Final    BOTTLES DRAWN AEROBIC AND ANAEROBIC Blood Culture results may not be optimal due to an inadequate volume of blood received in culture bottles Performed at [facility], [address] [place], [place] [address]    Culture  Setup Time   Final    GRAM POSITIVE COCCI IN BOTH AEROBIC AND ANAEROBIC BOTTLES CRITICAL RESULT CALLED TO, READ BACK BY AND VERIFIED WITH: [place] @1432  09/28/21 SCS    Culture STAPHYLOCOCCUS CAPITIS (A)  Final   Report Status 10/01/2021 FINAL  Final   Organism ID, Bacteria STAPHYLOCOCCUS CAPITIS  Final      Susceptibility   Staphylococcus capitis - MIC*    CIPROFLOXACIN <=0.5 SENSITIVE Sensitive     ERYTHROMYCIN 0.5 SENSITIVE Sensitive     GENTAMICIN <=0.5 SENSITIVE Sensitive     OXACILLIN RESISTANT Resistant     TETRACYCLINE <=1 SENSITIVE Sensitive     VANCOMYCIN <=0.5 SENSITIVE Sensitive     TRIMETH/SULFA <=10 SENSITIVE Sensitive     CLINDAMYCIN <=0.25 SENSITIVE Sensitive     RIFAMPIN <=0.5 SENSITIVE Sensitive     Inducible Clindamycin NEGATIVE Sensitive     * STAPHYLOCOCCUS CAPITIS  Blood culture (routine x 2)     Status: Abnormal   Collection Time: 09/27/21  7:33 PM   Specimen: Right Antecubital; Blood  Result Value Ref Range Status   Specimen Description   Final    RIGHT ANTECUBITAL Performed at [facility], [address] [place], [place] [address]    Special Requests   Final    BOTTLES DRAWN AEROBIC AND ANAEROBIC Blood Culture results may not be optimal due to an inadequate volume of blood received in culture bottles Performed at [facility], [address] [place], [place] [address]    Culture  Setup Time   Final    IN BOTH AEROBIC AND ANAEROBIC BOTTLES GRAM POSITIVE COCCI CRITICAL VALUE NOTED.  VALUE IS CONSISTENT WITH PREVIOUSLY REPORTED AND CALLED VALUE. SCS 09/28/21     Culture (A)  Final    STAPHYLOCOCCUS CAPITIS SUSCEPTIBILITIES PERFORMED ON PREVIOUS CULTURE WITHIN THE LAST 5 DAYS. Performed at [facility], [address], [place], [place] [address]    Report Status 10/02/2021 FINAL  Final  Blood Culture ID Panel (Reflexed)     Status: Abnormal   Collection Time: 09/27/21  7:33 PM   Result Value Ref Range Status   Enterococcus faecalis NOT DETECTED NOT DETECTED Final   Enterococcus Faecium NOT DETECTED NOT DETECTED Final   Listeria monocytogenes NOT DETECTED NOT DETECTED Final   Staphylococcus species DETECTED (A) NOT DETECTED Final    Comment: CRITICAL RESULT CALLED TO, READ BACK BY AND VERIFIED WITH: SUSAN WATSON @1432  09/28/21 SCS    Staphylococcus aureus (BCID) NOT DETECTED  NOT DETECTED Final   Staphylococcus epidermidis NOT DETECTED NOT DETECTED Final   Staphylococcus lugdunensis NOT DETECTED NOT DETECTED Final   Streptococcus species NOT DETECTED NOT DETECTED Final   Streptococcus agalactiae NOT DETECTED NOT DETECTED Final   Streptococcus pneumoniae NOT DETECTED NOT DETECTED Final   Streptococcus pyogenes NOT DETECTED NOT DETECTED Final   A.calcoaceticus-baumannii NOT DETECTED NOT DETECTED Final   Bacteroides fragilis NOT DETECTED NOT DETECTED Final   Enterobacterales NOT DETECTED NOT DETECTED Final   Enterobacter cloacae complex NOT DETECTED NOT DETECTED Final   Escherichia coli NOT DETECTED NOT DETECTED Final   Klebsiella aerogenes NOT DETECTED NOT DETECTED Final   Klebsiella oxytoca NOT DETECTED NOT DETECTED Final   Klebsiella pneumoniae NOT DETECTED NOT DETECTED Final   Proteus species NOT DETECTED NOT DETECTED Final   Salmonella species NOT DETECTED NOT DETECTED Final   Serratia marcescens NOT DETECTED NOT DETECTED Final   Haemophilus influenzae NOT DETECTED NOT DETECTED Final   Neisseria meningitidis NOT DETECTED NOT DETECTED Final   Pseudomonas aeruginosa NOT DETECTED NOT DETECTED Final   Stenotrophomonas maltophilia NOT DETECTED NOT DETECTED Final   Candida albicans NOT DETECTED NOT DETECTED Final   Candida auris NOT DETECTED NOT DETECTED Final   Candida glabrata NOT DETECTED NOT DETECTED Final   Candida krusei NOT DETECTED NOT DETECTED Final   Candida parapsilosis NOT DETECTED NOT DETECTED Final   Candida tropicalis NOT DETECTED NOT DETECTED  Final   Cryptococcus neoformans/gattii NOT DETECTED NOT DETECTED Final    Comment: Performed at [facility], [address] [place], [place] [address]  MRSA Next Gen by PCR, Nasal     Status: None   Collection Time: 09/28/21  2:00 PM  Result Value Ref Range Status   MRSA by PCR Next Gen NOT DETECTED NOT DETECTED Final    Comment: (NOTE) The GeneXpert MRSA Assay (FDA approved for NASAL specimens only), is one component of a comprehensive MRSA colonization surveillance program. It is not intended to diagnose MRSA infection nor to guide or monitor treatment for MRSA infections. Test performance is not FDA approved in patients less than 90 years old. Performed at [facility], [address] [place], [place] [address]   CULTURE, BLOOD (ROUTINE X 2) w Reflex to ID Panel     Status: None (Preliminary result)   Collection Time: 09/29/21  1:29 PM   Specimen: BLOOD  Result Value Ref Range Status   Specimen Description BLOOD [facility]  Final   Special Requests BOTTLES DRAWN AEROBIC AND ANAEROBIC BCLV  Final   Culture   Final    NO GROWTH 4 DAYS Performed at [facility], [address] [place], [place] [address]    Report Status PENDING  Incomplete  CULTURE, BLOOD (ROUTINE X 2) w Reflex to ID Panel     Status: None (Preliminary result)   Collection Time: 09/29/21  3:25 PM   Specimen: BLOOD  Result Value Ref Range Status   Specimen Description BLOOD [facility]  Final   Special Requests BOTTLES DRAWN AEROBIC AND ANAEROBIC BCAV  Final   Culture   Final    NO GROWTH 4 DAYS Performed at [facility], [address], [place], [place] [address]    Report Status PENDING  Incomplete         Radiology Studies: No results found.      Scheduled Meds: Continuous Infusions:   LOS: 6 days    Time spent: 22 minutes    Sharen Hones, MD [facility]  To contact the attending provider between 7A-7P or the covering provider during after hours  7P-7A, please log into the web site [email or web address] and access using universal [place] password for that web site. If you do not have the password, please call the hospital operator.  10/03/2021, 9:40 AM

## 2021-10-04 DIAGNOSIS — U071 COVID-19: Secondary | ICD-10-CM | POA: Diagnosis not present

## 2021-10-04 DIAGNOSIS — N179 Acute kidney failure, unspecified: Secondary | ICD-10-CM | POA: Diagnosis not present

## 2021-10-04 DIAGNOSIS — E872 Acidosis, unspecified: Secondary | ICD-10-CM

## 2021-10-04 DIAGNOSIS — J1282 Pneumonia due to coronavirus disease 2019: Secondary | ICD-10-CM | POA: Diagnosis not present

## 2021-10-04 DIAGNOSIS — J9601 Acute respiratory failure with hypoxia: Secondary | ICD-10-CM | POA: Diagnosis not present

## 2021-10-04 LAB — CULTURE, BLOOD (ROUTINE X 2)
Culture: NO GROWTH
Culture: NO GROWTH

## 2021-10-04 NOTE — Progress Notes (Signed)
PROGRESS NOTE    Crystal Faulkner  FGH:[id number] DOB: Dec 04, 1924 DOA: 09/27/2021 PCP: Steele Sizer, MD    Brief Narrative:  Crystal Faulkner is a 88 y.o. female presenting with AMS and recent covid diagnosis on Monday and since then pt has been declining with mentation and generalized weakness and worsening mental status and somnolent.. She has a very poor appetite, not to be eating.  Transition to comfort care 2/8.   Assessment & Plan:   Principal Problem:   Pneumonia Active Problems:   Coronary artery disease   Benign hypertension   Adult hypothyroidism   Acute kidney injury superimposed on chronic kidney disease (HCC)   AMS (altered mental status)   History of CVA in adulthood   Acute respiratory failure with hypoxia (HCC)   Metabolic acidosis  Covid pneumonia. Community-acquired pneumonia. Staphylococcus capitis bacteremia. Acute hypoxemic respiratory failure Hyponatremia. Failure to thrive. Acute kidney injury superimposed on chronic kidney disease stage V. Anemia of chronic kidney disease. Coronary artery disease Chronic diastolic congestive heart failure. Hypothyroidism.  Met with patient and family, patient started eating again.  She is not in any distress. I will continue to monitor patient for another day, if she continue eats well, she probably can be discharged home with hospice care.  We will make a decision in the next few days.       I/O last 3 completed shifts: In: 0  Out: 900 [Urine:900] No intake/output data recorded.     Subjective: Patient is awake, nonverbal.  She does not have any pain. She is seem to have appetite today, family was not feeding her.  No nausea vomiting.  Objective: Vitals:   10/02/21 1156 10/03/21 0443 10/03/21 1527 10/04/21 0445  BP: 127/80 131/84 (!) 161/94 (!) 159/97  Pulse: 64 98 82 85  Resp: '18 17 15 17  ' Temp: (!) 97 F (36.1 C) 98 F (36.7 C) 98.1 F (36.7 C) 97.7 F (36.5 C)  TempSrc: Axillary  Oral    SpO2: 100% 96% 100% 98%  Weight:      Height:        Intake/Output Summary (Last 24 hours) at 10/04/2021 1436 Last data filed at 10/03/2021 1536 Gross per 24 hour  Intake --  Output 100 ml  Net -100 ml   Filed Weights   09/27/21 1846 09/30/21 2100  Weight: 52 kg 50.2 kg    Examination:  General exam: Appears calm and comfortable  Respiratory system: Clear to auscultation. Respiratory effort normal. Cardiovascular system: S1 & S2 heard, RRR. No JVD, murmurs, rubs, gallops or clicks. No pedal edema. Gastrointestinal system: Abdomen is nondistended, soft and nontender. No organomegaly or masses felt. Normal bowel sounds heard. Central nervous system: Alert and aphasic no focal neurological deficits. Extremities: Symmetric 5 x 5 power. Skin: No rashes, lesions or ulcers     Data Reviewed: I have personally reviewed following labs and imaging studies  CBC: Recent Labs  Lab 09/27/21 1844 09/28/21 0728 09/28/21 1453 09/29/21 0101 09/29/21 6789 09/30/21 0528 10/01/21 0501 10/02/21 0634  WBC 7.4 6.3  --  8.1  --  11.3* 10.7* 10.4  NEUTROABS 5.4  --   --   --   --   --   --   --   HGB 8.2* 7.4*   < > 7.8* 8.2* 7.4* 7.1* 7.7*  HCT 27.2* 24.4*   < > 25.3* 26.5* 23.5* 23.5* 25.3*  MCV 102.6* 103.0*  --  98.4  --  97.5 100.9* 98.1  PLT  182 190  --  219  --  210 219 201   < > = values in this interval not displayed.   Basic Metabolic Panel: Recent Labs  Lab 09/28/21 0728 09/29/21 0101 09/30/21 0528 10/01/21 0501 10/02/21 0634  NA 143 146* 151* 151* 147*  K 4.3 4.7 4.1 3.9 3.5  CL 115* 119* 125* 126* 123*  CO2 17* 18* 19* 18* 18*  GLUCOSE 81 157* 138* 161* 120*  BUN 60* 66* 84* 86* 83*  CREATININE 4.08* 4.17* 4.20* 3.79* 3.42*  CALCIUM 7.8* 8.1* 8.4* 8.5* 8.4*   GFR: Estimated Creatinine Clearance: 7.2 mL/min (A) (by C-G formula based on SCr of 3.42 mg/dL (H)). Liver Function Tests: Recent Labs  Lab 09/27/21 1844  AST 37  ALT 19  ALKPHOS 48  BILITOT 0.4   PROT 6.1*  ALBUMIN 2.4*   No results for input(s): LIPASE, AMYLASE in the last 168 hours. No results for input(s): AMMONIA in the last 168 hours. Coagulation Profile: Recent Labs  Lab 09/27/21 1844  INR 1.2   Cardiac Enzymes: No results for input(s): CKTOTAL, CKMB, CKMBINDEX, TROPONINI in the last 168 hours. BNP (last 3 results) No results for input(s): PROBNP in the last 8760 hours. HbA1C: No results for input(s): HGBA1C in the last 72 hours. CBG: No results for input(s): GLUCAP in the last 168 hours. Lipid Profile: No results for input(s): CHOL, HDL, LDLCALC, TRIG, CHOLHDL, LDLDIRECT in the last 72 hours. Thyroid Function Tests: No results for input(s): TSH, T4TOTAL, FREET4, T3FREE, THYROIDAB in the last 72 hours. Anemia Panel: No results for input(s): VITAMINB12, FOLATE, FERRITIN, TIBC, IRON, RETICCTPCT in the last 72 hours. Sepsis Labs: Recent Labs  Lab 09/27/21 1844 09/27/21 2050  PROCALCITON 26.31  --   LATICACIDVEN 1.7 1.1    Recent Results (from the past 240 hour(s))  Resp Panel by RT-PCR (Flu A&B, Covid) Nasopharyngeal Swab     Status: Abnormal   Collection Time: 09/27/21  7:33 PM   Specimen: Nasopharyngeal Swab; Nasopharyngeal(NP) swabs in vial transport medium  Result Value Ref Range Status   SARS Coronavirus 2 by RT PCR POSITIVE (A) NEGATIVE Final    Comment: (NOTE) SARS-CoV-2 target nucleic acids are DETECTED.  The SARS-CoV-2 RNA is generally detectable in upper respiratory specimens during the acute phase of infection. Positive results are indicative of the presence of the identified virus, but do not rule out bacterial infection or co-infection with other pathogens not detected by the test. Clinical correlation with patient history and other diagnostic information is necessary to determine patient infection status. The expected result is Negative.  Fact Sheet for Patients: EntrepreneurPulse.com.au  Fact Sheet for Healthcare  Providers: IncredibleEmployment.be  This test is not yet approved or cleared by the [place] FDA and  has been authorized for detection and/or diagnosis of SARS-CoV-2 by FDA under an Emergency Use Authorization (EUA).  This EUA will remain in effect (meaning this test can be used) for the duration of  the COVID-19 declaration under Section 564(b)(1) of the A ct, 21 U.S.C. section 360bbb-3(b)(1), unless the authorization is terminated or revoked sooner.     Influenza A by PCR NEGATIVE NEGATIVE Final   Influenza B by PCR NEGATIVE NEGATIVE Final    Comment: (NOTE) The Xpert Xpress SARS-CoV-2/FLU/RSV plus assay is intended as an aid in the diagnosis of influenza from Nasopharyngeal swab specimens and should not be used as a sole basis for treatment. Nasal washings and aspirates are unacceptable for Xpert Xpress SARS-CoV-2/FLU/RSV testing.  Fact Sheet  for Patients: EntrepreneurPulse.com.au  Fact Sheet for Healthcare Providers: IncredibleEmployment.be  This test is not yet approved or cleared by the [place] FDA and has been authorized for detection and/or diagnosis of SARS-CoV-2 by FDA under an Emergency Use Authorization (EUA). This EUA will remain in effect (meaning this test can be used) for the duration of the COVID-19 declaration under Section 564(b)(1) of the Act, 21 U.S.C. section 360bbb-3(b)(1), unless the authorization is terminated or revoked.  Performed at [facility], [address] [place], [place] [address]   Blood culture (routine x 2)     Status: Abnormal   Collection Time: 09/27/21  7:33 PM   Specimen: Left Antecubital; Blood  Result Value Ref Range Status   Specimen Description   Final    LEFT ANTECUBITAL Performed at [facility], [address] [place], [place] [address]    Special Requests   Final    BOTTLES DRAWN AEROBIC AND ANAEROBIC Blood Culture results may not be  optimal due to an inadequate volume of blood received in culture bottles Performed at [facility], [address], [place], [place] [address]    Culture  Setup Time   Final    GRAM POSITIVE COCCI IN BOTH AEROBIC AND ANAEROBIC BOTTLES CRITICAL RESULT CALLED TO, READ BACK BY AND VERIFIED WITH: [place] '@[id number]'  09/28/21 SCS    Culture STAPHYLOCOCCUS CAPITIS (A)  Final   Report Status 10/01/2021 FINAL  Final   Organism ID, Bacteria STAPHYLOCOCCUS CAPITIS  Final      Susceptibility   Staphylococcus capitis - MIC*    CIPROFLOXACIN <=0.5 SENSITIVE Sensitive     ERYTHROMYCIN 0.5 SENSITIVE Sensitive     GENTAMICIN <=0.5 SENSITIVE Sensitive     OXACILLIN RESISTANT Resistant     TETRACYCLINE <=1 SENSITIVE Sensitive     VANCOMYCIN <=0.5 SENSITIVE Sensitive     TRIMETH/SULFA <=10 SENSITIVE Sensitive     CLINDAMYCIN <=0.25 SENSITIVE Sensitive     RIFAMPIN <=0.5 SENSITIVE Sensitive     Inducible Clindamycin NEGATIVE Sensitive     * STAPHYLOCOCCUS CAPITIS  Blood culture (routine x 2)     Status: Abnormal   Collection Time: 09/27/21  7:33 PM   Specimen: Right Antecubital; Blood  Result Value Ref Range Status   Specimen Description   Final    RIGHT ANTECUBITAL Performed at [facility], [address] [place], [place] [address]    Special Requests   Final    BOTTLES DRAWN AEROBIC AND ANAEROBIC Blood Culture results may not be optimal due to an inadequate volume of blood received in culture bottles Performed at [facility], [address] [place], [place] [address]    Culture  Setup Time   Final    IN BOTH AEROBIC AND ANAEROBIC BOTTLES GRAM POSITIVE COCCI CRITICAL VALUE NOTED.  VALUE IS CONSISTENT WITH PREVIOUSLY REPORTED AND CALLED VALUE. SCS 09/28/21     Culture (A)  Final    STAPHYLOCOCCUS CAPITIS SUSCEPTIBILITIES PERFORMED ON PREVIOUS CULTURE WITHIN THE LAST 5 DAYS. Performed at [facility], [place] [address], [place], [place] [address]    Report  Status 10/02/2021 FINAL  Final  Blood Culture ID Panel (Reflexed)     Status: Abnormal   Collection Time: 09/27/21  7:33 PM  Result Value Ref Range Status   Enterococcus faecalis NOT DETECTED NOT DETECTED Final   Enterococcus Faecium NOT DETECTED NOT DETECTED Final   Listeria monocytogenes NOT DETECTED NOT DETECTED Final   Staphylococcus species DETECTED (A) NOT  DETECTED Final    Comment: CRITICAL RESULT CALLED TO, READ BACK BY AND VERIFIED WITH: [place] '@[id number]'  09/28/21 SCS    Staphylococcus aureus (BCID) NOT DETECTED NOT DETECTED Final   Staphylococcus epidermidis NOT DETECTED NOT DETECTED Final   Staphylococcus lugdunensis NOT DETECTED NOT DETECTED Final   Streptococcus species NOT DETECTED NOT DETECTED Final   Streptococcus agalactiae NOT DETECTED NOT DETECTED Final   Streptococcus pneumoniae NOT DETECTED NOT DETECTED Final   Streptococcus pyogenes NOT DETECTED NOT DETECTED Final   A.calcoaceticus-baumannii NOT DETECTED NOT DETECTED Final   Bacteroides fragilis NOT DETECTED NOT DETECTED Final   Enterobacterales NOT DETECTED NOT DETECTED Final   Enterobacter cloacae complex NOT DETECTED NOT DETECTED Final   Escherichia coli NOT DETECTED NOT DETECTED Final   Klebsiella aerogenes NOT DETECTED NOT DETECTED Final   Klebsiella oxytoca NOT DETECTED NOT DETECTED Final   Klebsiella pneumoniae NOT DETECTED NOT DETECTED Final   Proteus species NOT DETECTED NOT DETECTED Final   Salmonella species NOT DETECTED NOT DETECTED Final   Serratia marcescens NOT DETECTED NOT DETECTED Final   Haemophilus influenzae NOT DETECTED NOT DETECTED Final   Neisseria meningitidis NOT DETECTED NOT DETECTED Final   Pseudomonas aeruginosa NOT DETECTED NOT DETECTED Final   Stenotrophomonas maltophilia NOT DETECTED NOT DETECTED Final   Candida albicans NOT DETECTED NOT DETECTED Final   Candida auris NOT DETECTED NOT DETECTED Final   Candida glabrata NOT DETECTED NOT DETECTED Final   Candida krusei NOT DETECTED  NOT DETECTED Final   Candida parapsilosis NOT DETECTED NOT DETECTED Final   Candida tropicalis NOT DETECTED NOT DETECTED Final   Cryptococcus neoformans/gattii NOT DETECTED NOT DETECTED Final    Comment: Performed at [facility], [address] [place], [place] [address]  MRSA Next Gen by PCR, Nasal     Status: None   Collection Time: 09/28/21  2:00 PM  Result Value Ref Range Status   MRSA by PCR Next Gen NOT DETECTED NOT DETECTED Final    Comment: (NOTE) The GeneXpert MRSA Assay (FDA approved for NASAL specimens only), is one component of a comprehensive MRSA colonization surveillance program. It is not intended to diagnose MRSA infection nor to guide or monitor treatment for MRSA infections. Test performance is not FDA approved in patients less than 34 years old. Performed at [facility], [address] [place], [place] [address]   CULTURE, BLOOD (ROUTINE X 2) w Reflex to ID Panel     Status: None   Collection Time: 09/29/21  1:29 PM   Specimen: BLOOD  Result Value Ref Range Status   Specimen Description BLOOD [facility]  Final   Special Requests BOTTLES DRAWN AEROBIC AND ANAEROBIC BCLV  Final   Culture   Final    NO GROWTH 5 DAYS Performed at [facility], [address] [place], [place] [address]    Report Status 10/04/2021 FINAL  Final  CULTURE, BLOOD (ROUTINE X 2) w Reflex to ID Panel     Status: None   Collection Time: 09/29/21  3:25 PM   Specimen: BLOOD  Result Value Ref Range Status   Specimen Description BLOOD [facility]  Final   Special Requests BOTTLES DRAWN AEROBIC AND ANAEROBIC BCAV  Final   Culture   Final    NO GROWTH 5 DAYS Performed at [facility], [address] [place], [place] [address]    Report Status 10/04/2021 FINAL  Final         Radiology Studies: No results found.  Scheduled Meds: Continuous Infusions:   LOS: 7 days    Time spent: 27 minutes    Sharen Hones, MD [facility]   To  contact the attending provider between 7A-7P or the covering provider during after hours 7P-7A, please log into the web site [email or web address] and access using universal [place] password for that web site. If you do not have the password, please call the hospital operator.  10/04/2021, 2:36 PM

## 2021-10-05 DIAGNOSIS — J1282 Pneumonia due to coronavirus disease 2019: Secondary | ICD-10-CM | POA: Diagnosis not present

## 2021-10-05 DIAGNOSIS — U071 COVID-19: Secondary | ICD-10-CM | POA: Diagnosis not present

## 2021-10-05 DIAGNOSIS — N179 Acute kidney failure, unspecified: Secondary | ICD-10-CM | POA: Diagnosis not present

## 2021-10-05 DIAGNOSIS — J9601 Acute respiratory failure with hypoxia: Secondary | ICD-10-CM | POA: Diagnosis not present

## 2021-10-05 NOTE — Progress Notes (Signed)
PROGRESS NOTE    Crystal Faulkner  OAC:[id number] DOB: Mar 04, 1925 DOA: 09/27/2021 PCP: Steele Sizer, MD    Brief Narrative:  Crystal Faulkner is a 88 y.o. female presenting with AMS and recent covid diagnosis on Monday and since then pt has been declining with mentation and generalized weakness and worsening mental status and somnolent.. She has a very poor appetite, not to be eating.  Transition to comfort care 2/8.     Assessment & Plan:   Principal Problem:   Pneumonia Active Problems:   Coronary artery disease   Benign hypertension   Adult hypothyroidism   Acute kidney injury superimposed on chronic kidney disease (HCC)   AMS (altered mental status)   History of CVA in adulthood   Acute respiratory failure with hypoxia (HCC)   Metabolic acidosis Covid pneumonia. Community-acquired pneumonia. Staphylococcus capitis bacteremia. Acute hypoxemic respiratory failure Hyponatremia. Failure to thrive. Acute kidney injury superimposed on chronic kidney disease stage V. Anemia of chronic kidney disease. Coronary artery disease Chronic diastolic congestive heart failure. Hypothyroidism.   Patient seems to have good appetite, she is comfortable.  She is nonverbal. Discussed with the case management and hospice, will determine if patient still appropriate for inpatient hospice versus long-term care with hospice.     I/O last 3 completed shifts: In: -  Out: 900 [Urine:900] No intake/output data recorded.     Subjective: Patient condition seems to be improving, she has a good appetite, she had bowel movement.  Short of breath or cough.   Objective: Vitals:   10/03/21 0443 10/03/21 1527 10/04/21 0445 10/05/21 0758  BP: 131/84 (!) 161/94 (!) 159/97 (!) 173/91  Pulse: 98 82 85 80  Resp: 17 15 17 16   Temp: 98 F (36.7 C) 98.1 F (36.7 C) 97.7 F (36.5 C) 98.4 F (36.9 C)  TempSrc:  Oral  Oral  SpO2: 96% 100% 98% 98%  Weight:      Height:        Intake/Output  Summary (Last 24 hours) at 10/05/2021 1116 Last data filed at 10/05/2021 0543 Gross per 24 hour  Intake --  Output 900 ml  Net -900 ml   Filed Weights   09/27/21 1846 09/30/21 2100  Weight: 52 kg 50.2 kg    Examination:  General exam: Appears calm and comfortable  Respiratory system: Clear to auscultation. Respiratory effort normal. Cardiovascular system: S1 & S2 heard, RRR. No JVD, murmurs, rubs, gallops or clicks. No pedal edema. Gastrointestinal system: Abdomen is nondistended, soft and nontender. No organomegaly or masses felt. Normal bowel sounds heard. Central nervous system: Alert and nonverbal,. No focal neurological deficits. Extremities: Symmetric 5 x 5 power. Skin: No rashes, lesions or ulcers     Data Reviewed: I have personally reviewed following labs and imaging studies  CBC: Recent Labs  Lab 09/29/21 0101 09/29/21 0633 09/30/21 0528 10/01/21 0501 10/02/21 0634  WBC 8.1  --  11.3* 10.7* 10.4  HGB 7.8* 8.2* 7.4* 7.1* 7.7*  HCT 25.3* 26.5* 23.5* 23.5* 25.3*  MCV 98.4  --  97.5 100.9* 98.1  PLT 219  --  210 219 010   Basic Metabolic Panel: Recent Labs  Lab 09/29/21 0101 09/30/21 0528 10/01/21 0501 10/02/21 0634  NA 146* 151* 151* 147*  K 4.7 4.1 3.9 3.5  CL 119* 125* 126* 123*  CO2 18* 19* 18* 18*  GLUCOSE 157* 138* 161* 120*  BUN 66* 84* 86* 83*  CREATININE 4.17* 4.20* 3.79* 3.42*  CALCIUM 8.1* 8.4* 8.5* 8.4*  GFR: Estimated Creatinine Clearance: 7.2 mL/min (A) (by C-G formula based on SCr of 3.42 mg/dL (H)). Liver Function Tests: No results for input(s): AST, ALT, ALKPHOS, BILITOT, PROT, ALBUMIN in the last 168 hours. No results for input(s): LIPASE, AMYLASE in the last 168 hours. No results for input(s): AMMONIA in the last 168 hours. Coagulation Profile: No results for input(s): INR, PROTIME in the last 168 hours. Cardiac Enzymes: No results for input(s): CKTOTAL, CKMB, CKMBINDEX, TROPONINI in the last 168 hours. BNP (last 3  results) No results for input(s): PROBNP in the last 8760 hours. HbA1C: No results for input(s): HGBA1C in the last 72 hours. CBG: No results for input(s): GLUCAP in the last 168 hours. Lipid Profile: No results for input(s): CHOL, HDL, LDLCALC, TRIG, CHOLHDL, LDLDIRECT in the last 72 hours. Thyroid Function Tests: No results for input(s): TSH, T4TOTAL, FREET4, T3FREE, THYROIDAB in the last 72 hours. Anemia Panel: No results for input(s): VITAMINB12, FOLATE, FERRITIN, TIBC, IRON, RETICCTPCT in the last 72 hours. Sepsis Labs: No results for input(s): PROCALCITON, LATICACIDVEN in the last 168 hours.  Recent Results (from the past 240 hour(s))  Resp Panel by RT-PCR (Flu A&B, Covid) Nasopharyngeal Swab     Status: Abnormal   Collection Time: 09/27/21  7:33 PM   Specimen: Nasopharyngeal Swab; Nasopharyngeal(NP) swabs in vial transport medium  Result Value Ref Range Status   SARS Coronavirus 2 by RT PCR POSITIVE (A) NEGATIVE Final    Comment: (NOTE) SARS-CoV-2 target nucleic acids are DETECTED.  The SARS-CoV-2 RNA is generally detectable in upper respiratory specimens during the acute phase of infection. Positive results are indicative of the presence of the identified virus, but do not rule out bacterial infection or co-infection with other pathogens not detected by the test. Clinical correlation with patient history and other diagnostic information is necessary to determine patient infection status. The expected result is Negative.  Fact Sheet for Patients: EntrepreneurPulse.com.au  Fact Sheet for Healthcare Providers: IncredibleEmployment.be  This test is not yet approved or cleared by the [place] FDA and  has been authorized for detection and/or diagnosis of SARS-CoV-2 by FDA under an Emergency Use Authorization (EUA).  This EUA will remain in effect (meaning this test can be used) for the duration of  the COVID-19 declaration under  Section 564(b)(1) of the A ct, 21 U.S.C. section 360bbb-3(b)(1), unless the authorization is terminated or revoked sooner.     Influenza A by PCR NEGATIVE NEGATIVE Final   Influenza B by PCR NEGATIVE NEGATIVE Final    Comment: (NOTE) The Xpert Xpress SARS-CoV-2/FLU/RSV plus assay is intended as an aid in the diagnosis of influenza from Nasopharyngeal swab specimens and should not be used as a sole basis for treatment. Nasal washings and aspirates are unacceptable for Xpert Xpress SARS-CoV-2/FLU/RSV testing.  Fact Sheet for Patients: EntrepreneurPulse.com.au  Fact Sheet for Healthcare Providers: IncredibleEmployment.be  This test is not yet approved or cleared by the [place] FDA and has been authorized for detection and/or diagnosis of SARS-CoV-2 by FDA under an Emergency Use Authorization (EUA). This EUA will remain in effect (meaning this test can be used) for the duration of the COVID-19 declaration under Section 564(b)(1) of the Act, 21 U.S.C. section 360bbb-3(b)(1), unless the authorization is terminated or revoked.  Performed at [facility], [address] [place], [place] [address]   Blood culture (routine x 2)     Status: Abnormal   Collection Time: 09/27/21  7:33 PM   Specimen: Left Antecubital; Blood  Result  Value Ref Range Status   Specimen Description   Final    LEFT ANTECUBITAL Performed at [facility], [address] [place], [place] [address]    Special Requests   Final    BOTTLES DRAWN AEROBIC AND ANAEROBIC Blood Culture results may not be optimal due to an inadequate volume of blood received in culture bottles Performed at [facility], [address] [place], [place] [address]    Culture  Setup Time   Final    GRAM POSITIVE COCCI IN BOTH AEROBIC AND ANAEROBIC BOTTLES CRITICAL RESULT CALLED TO, READ BACK BY AND VERIFIED WITH: [place] @1432  09/28/21 SCS    Culture  STAPHYLOCOCCUS CAPITIS (A)  Final   Report Status 10/01/2021 FINAL  Final   Organism ID, Bacteria STAPHYLOCOCCUS CAPITIS  Final      Susceptibility   Staphylococcus capitis - MIC*    CIPROFLOXACIN <=0.5 SENSITIVE Sensitive     ERYTHROMYCIN 0.5 SENSITIVE Sensitive     GENTAMICIN <=0.5 SENSITIVE Sensitive     OXACILLIN RESISTANT Resistant     TETRACYCLINE <=1 SENSITIVE Sensitive     VANCOMYCIN <=0.5 SENSITIVE Sensitive     TRIMETH/SULFA <=10 SENSITIVE Sensitive     CLINDAMYCIN <=0.25 SENSITIVE Sensitive     RIFAMPIN <=0.5 SENSITIVE Sensitive     Inducible Clindamycin NEGATIVE Sensitive     * STAPHYLOCOCCUS CAPITIS  Blood culture (routine x 2)     Status: Abnormal   Collection Time: 09/27/21  7:33 PM   Specimen: Right Antecubital; Blood  Result Value Ref Range Status   Specimen Description   Final    RIGHT ANTECUBITAL Performed at [facility], [address], [place], [place] [address]    Special Requests   Final    BOTTLES DRAWN AEROBIC AND ANAEROBIC Blood Culture results may not be optimal due to an inadequate volume of blood received in culture bottles Performed at [facility], [address] [place], [place] [address]    Culture  Setup Time   Final    IN BOTH AEROBIC AND ANAEROBIC BOTTLES GRAM POSITIVE COCCI CRITICAL VALUE NOTED.  VALUE IS CONSISTENT WITH PREVIOUSLY REPORTED AND CALLED VALUE. SCS 09/28/21     Culture (A)  Final    STAPHYLOCOCCUS CAPITIS SUSCEPTIBILITIES PERFORMED ON PREVIOUS CULTURE WITHIN THE LAST 5 DAYS. Performed at [facility], [place] [address] [place], [place] [address]    Report Status 10/02/2021 FINAL  Final  Blood Culture ID Panel (Reflexed)     Status: Abnormal   Collection Time: 09/27/21  7:33 PM  Result Value Ref Range Status   Enterococcus faecalis NOT DETECTED NOT DETECTED Final   Enterococcus Faecium NOT DETECTED NOT DETECTED Final   Listeria monocytogenes NOT DETECTED NOT DETECTED Final   Staphylococcus species  DETECTED (A) NOT DETECTED Final    Comment: CRITICAL RESULT CALLED TO, READ BACK BY AND VERIFIED WITH: SUSAN WATSON @1432  09/28/21 SCS    Staphylococcus aureus (BCID) NOT DETECTED NOT DETECTED Final   Staphylococcus epidermidis NOT DETECTED NOT DETECTED Final   Staphylococcus lugdunensis NOT DETECTED NOT DETECTED Final   Streptococcus species NOT DETECTED NOT DETECTED Final   Streptococcus agalactiae NOT DETECTED NOT DETECTED Final   Streptococcus pneumoniae NOT DETECTED NOT DETECTED Final   Streptococcus pyogenes NOT DETECTED NOT DETECTED Final   A.calcoaceticus-baumannii NOT DETECTED NOT DETECTED Final   Bacteroides fragilis NOT DETECTED NOT DETECTED Final   Enterobacterales NOT DETECTED NOT DETECTED Final   Enterobacter cloacae complex NOT DETECTED NOT DETECTED  Final   Escherichia coli NOT DETECTED NOT DETECTED Final   Klebsiella aerogenes NOT DETECTED NOT DETECTED Final   Klebsiella oxytoca NOT DETECTED NOT DETECTED Final   Klebsiella pneumoniae NOT DETECTED NOT DETECTED Final   Proteus species NOT DETECTED NOT DETECTED Final   Salmonella species NOT DETECTED NOT DETECTED Final   Serratia marcescens NOT DETECTED NOT DETECTED Final   Haemophilus influenzae NOT DETECTED NOT DETECTED Final   Neisseria meningitidis NOT DETECTED NOT DETECTED Final   Pseudomonas aeruginosa NOT DETECTED NOT DETECTED Final   Stenotrophomonas maltophilia NOT DETECTED NOT DETECTED Final   Candida albicans NOT DETECTED NOT DETECTED Final   Candida auris NOT DETECTED NOT DETECTED Final   Candida glabrata NOT DETECTED NOT DETECTED Final   Candida krusei NOT DETECTED NOT DETECTED Final   Candida parapsilosis NOT DETECTED NOT DETECTED Final   Candida tropicalis NOT DETECTED NOT DETECTED Final   Cryptococcus neoformans/gattii NOT DETECTED NOT DETECTED Final    Comment: Performed at [facility], [address] [place], [place] [address]  MRSA Next Gen by PCR, Nasal     Status: None   Collection Time:  09/28/21  2:00 PM  Result Value Ref Range Status   MRSA by PCR Next Gen NOT DETECTED NOT DETECTED Final    Comment: (NOTE) The GeneXpert MRSA Assay (FDA approved for NASAL specimens only), is one component of a comprehensive MRSA colonization surveillance program. It is not intended to diagnose MRSA infection nor to guide or monitor treatment for MRSA infections. Test performance is not FDA approved in patients less than 54 years old. Performed at [facility], [address] [place], [place] [address]   CULTURE, BLOOD (ROUTINE X 2) w Reflex to ID Panel     Status: None   Collection Time: 09/29/21  1:29 PM   Specimen: BLOOD  Result Value Ref Range Status   Specimen Description BLOOD [facility]  Final   Special Requests BOTTLES DRAWN AEROBIC AND ANAEROBIC BCLV  Final   Culture   Final    NO GROWTH 5 DAYS Performed at [facility], [address] [place], [place] [address]    Report Status 10/04/2021 FINAL  Final  CULTURE, BLOOD (ROUTINE X 2) w Reflex to ID Panel     Status: None   Collection Time: 09/29/21  3:25 PM   Specimen: BLOOD  Result Value Ref Range Status   Specimen Description BLOOD [facility]  Final   Special Requests BOTTLES DRAWN AEROBIC AND ANAEROBIC BCAV  Final   Culture   Final    NO GROWTH 5 DAYS Performed at [facility], [address], [place], [place] [address]    Report Status 10/04/2021 FINAL  Final         Radiology Studies: No results found.      Scheduled Meds: Continuous Infusions:   LOS: 8 days    Time spent: 28 minutes    Sharen Hones, MD [facility]   To contact the attending provider between 7A-7P or the covering provider during after hours 7P-7A, please log into the web site [email or web address] and access using universal [place] password for that web site. If you do not have the password, please call the hospital operator.  10/05/2021, 11:16 AM

## 2021-10-06 DIAGNOSIS — J9601 Acute respiratory failure with hypoxia: Secondary | ICD-10-CM | POA: Diagnosis not present

## 2021-10-06 DIAGNOSIS — N179 Acute kidney failure, unspecified: Secondary | ICD-10-CM | POA: Diagnosis not present

## 2021-10-06 DIAGNOSIS — U071 COVID-19: Secondary | ICD-10-CM | POA: Diagnosis not present

## 2021-10-06 DIAGNOSIS — J1282 Pneumonia due to coronavirus disease 2019: Secondary | ICD-10-CM | POA: Diagnosis not present

## 2021-10-06 NOTE — Progress Notes (Signed)
Per family request pt's vitals were taken at 3:20 am.

## 2021-10-06 NOTE — Progress Notes (Signed)
Manufacturing engineer ([place] note;  Notified by [facility] Oleh Genin of family interest in [place] home. Patient is currently on COVID isolation until 2/14. Liaison will assess appropriateness on 2/13, TOC aware. Thank you Flo Shanks BSN, RN, [employer] [phone]

## 2021-10-06 NOTE — Progress Notes (Signed)
PROGRESS NOTE    Crystal Faulkner  PJA:[id number] DOB: [birth year] DOA: 09/27/2021 PCP: Steele Sizer, MD    Brief Narrative:  Crystal Faulkner is a 88 y.o. female presenting with AMS and recent covid diagnosis on Monday and since then pt has been declining with mentation and generalized weakness and worsening mental status and somnolent.. She has a very poor appetite, not to be eating.  Transition to comfort care 2/8.   Assessment & Plan:   Principal Problem:   Pneumonia Active Problems:   Coronary artery disease   Benign hypertension   Adult hypothyroidism   Acute kidney injury superimposed on chronic kidney disease (HCC)   AMS (altered mental status)   History of CVA in adulthood   Acute respiratory failure with hypoxia (HCC)   Metabolic acidosis  Covid pneumonia. Community-acquired pneumonia. Staphylococcus capitis bacteremia. Acute hypoxemic respiratory failure Hyponatremia. Failure to thrive. Acute kidney injury superimposed on chronic kidney disease stage V. Anemia of chronic kidney disease. Coronary artery disease Chronic diastolic congestive heart failure. Hypothyroidism.    Patient had a significant cough last night, she also was moaning for pain.  She was given pain medicine last night.  She is comfortable now, still waiting for hospice transfer.  Quarantine for COVID will end Tuesday.     I/O last 3 completed shifts: In: -  Out: 900 [Urine:900] No intake/output data recorded.     Subjective: Patient has some pain last night, she also had episode of cough.  She is more comfortable today.  She is still eating.  Objective: Vitals:   10/04/21 0445 10/05/21 0758 10/06/21 0320 10/06/21 0913  BP: (!) 159/97 (!) 173/91 (!) 147/72 129/74  Pulse: 85 80 82 72  Resp: 17 16 12 16   Temp: 97.7 F (36.5 C) 98.4 F (36.9 C)  (!) 96.6 F (35.9 C)  TempSrc:  Oral  Axillary  SpO2: 98% 98% 98% (!) 80%  Weight:      Height:        Intake/Output Summary (Last  24 hours) at 10/06/2021 1011 Last data filed at 10/06/2021 0320 Gross per 24 hour  Intake --  Output 500 ml  Net -500 ml   Filed Weights   09/27/21 1846 09/30/21 2100  Weight: 52 kg 50.2 kg    Examination:  General exam: Appears calm and comfortable  Respiratory system: Clear to auscultation. Respiratory effort normal. Cardiovascular system: S1 & S2 heard, RRR. No JVD, murmurs, rubs, gallops or clicks. No pedal edema. Gastrointestinal system: Abdomen is nondistended, soft and nontender. No organomegaly or masses felt. Normal bowel sounds heard. Central nervous system: Alert and nonverbal.  No focal neurological deficits. Extremities: Symmetric 5 x 5 power. Skin: No rashes, lesions or ulcers    Data Reviewed: I have personally reviewed following labs and imaging studies  CBC: Recent Labs  Lab 09/30/21 0528 10/01/21 0501 10/02/21 0634  WBC 11.3* 10.7* 10.4  HGB 7.4* 7.1* 7.7*  HCT 23.5* 23.5* 25.3*  MCV 97.5 100.9* 98.1  PLT 210 219 341   Basic Metabolic Panel: Recent Labs  Lab 09/30/21 0528 10/01/21 0501 10/02/21 0634  NA 151* 151* 147*  K 4.1 3.9 3.5  CL 125* 126* 123*  CO2 19* 18* 18*  GLUCOSE 138* 161* 120*  BUN 84* 86* 83*  CREATININE 4.20* 3.79* 3.42*  CALCIUM 8.4* 8.5* 8.4*   GFR: Estimated Creatinine Clearance: 7.2 mL/min (A) (by C-G formula based on SCr of 3.42 mg/dL (H)). Liver Function Tests: No results for input(s): AST,  ALT, ALKPHOS, BILITOT, PROT, ALBUMIN in the last 168 hours. No results for input(s): LIPASE, AMYLASE in the last 168 hours. No results for input(s): AMMONIA in the last 168 hours. Coagulation Profile: No results for input(s): INR, PROTIME in the last 168 hours. Cardiac Enzymes: No results for input(s): CKTOTAL, CKMB, CKMBINDEX, TROPONINI in the last 168 hours. BNP (last 3 results) No results for input(s): PROBNP in the last 8760 hours. HbA1C: No results for input(s): HGBA1C in the last 72 hours. CBG: No results for input(s):  GLUCAP in the last 168 hours. Lipid Profile: No results for input(s): CHOL, HDL, LDLCALC, TRIG, CHOLHDL, LDLDIRECT in the last 72 hours. Thyroid Function Tests: No results for input(s): TSH, T4TOTAL, FREET4, T3FREE, THYROIDAB in the last 72 hours. Anemia Panel: No results for input(s): VITAMINB12, FOLATE, FERRITIN, TIBC, IRON, RETICCTPCT in the last 72 hours. Sepsis Labs: No results for input(s): PROCALCITON, LATICACIDVEN in the last 168 hours.  Recent Results (from the past 240 hour(s))  Resp Panel by RT-PCR (Flu A&B, Covid) Nasopharyngeal Swab     Status: Abnormal   Collection Time: 09/27/21  7:33 PM   Specimen: Nasopharyngeal Swab; Nasopharyngeal(NP) swabs in vial transport medium  Result Value Ref Range Status   SARS Coronavirus 2 by RT PCR POSITIVE (A) NEGATIVE Final    Comment: (NOTE) SARS-CoV-2 target nucleic acids are DETECTED.  The SARS-CoV-2 RNA is generally detectable in upper respiratory specimens during the acute phase of infection. Positive results are indicative of the presence of the identified virus, but do not rule out bacterial infection or co-infection with other pathogens not detected by the test. Clinical correlation with patient history and other diagnostic information is necessary to determine patient infection status. The expected result is Negative.  Fact Sheet for Patients: EntrepreneurPulse.com.au  Fact Sheet for Healthcare Providers: IncredibleEmployment.be  This test is not yet approved or cleared by the [place] FDA and  has been authorized for detection and/or diagnosis of SARS-CoV-2 by FDA under an Emergency Use Authorization (EUA).  This EUA will remain in effect (meaning this test can be used) for the duration of  the COVID-19 declaration under Section 564(b)(1) of the A ct, 21 U.S.C. section 360bbb-3(b)(1), unless the authorization is terminated or revoked sooner.     Influenza A by PCR NEGATIVE  NEGATIVE Final   Influenza B by PCR NEGATIVE NEGATIVE Final    Comment: (NOTE) The Xpert Xpress SARS-CoV-2/FLU/RSV plus assay is intended as an aid in the diagnosis of influenza from Nasopharyngeal swab specimens and should not be used as a sole basis for treatment. Nasal washings and aspirates are unacceptable for Xpert Xpress SARS-CoV-2/FLU/RSV testing.  Fact Sheet for Patients: EntrepreneurPulse.com.au  Fact Sheet for Healthcare Providers: IncredibleEmployment.be  This test is not yet approved or cleared by the [place] FDA and has been authorized for detection and/or diagnosis of SARS-CoV-2 by FDA under an Emergency Use Authorization (EUA). This EUA will remain in effect (meaning this test can be used) for the duration of the COVID-19 declaration under Section 564(b)(1) of the Act, 21 U.S.C. section 360bbb-3(b)(1), unless the authorization is terminated or revoked.  Performed at [facility], [address] [place],  [address]   Blood culture (routine x 2)     Status: Abnormal   Collection Time: 09/27/21  7:33 PM   Specimen: Left Antecubital; Blood  Result Value Ref Range Status   Specimen Description   Final    LEFT ANTECUBITAL Performed at [facility], [address],  [place], [place] [address]    Special Requests   Final    BOTTLES DRAWN AEROBIC AND ANAEROBIC Blood Culture results may not be optimal due to an inadequate volume of blood received in culture bottles Performed at [facility], [address] [place], [place] [address]    Culture  Setup Time   Final    GRAM POSITIVE COCCI IN BOTH AEROBIC AND ANAEROBIC BOTTLES CRITICAL RESULT CALLED TO, READ BACK BY AND VERIFIED WITH: [place] @1432  09/28/21 SCS    Culture STAPHYLOCOCCUS CAPITIS (A)  Final   Report Status 10/01/2021 FINAL  Final   Organism ID, Bacteria STAPHYLOCOCCUS CAPITIS  Final      Susceptibility   Staphylococcus  capitis - MIC*    CIPROFLOXACIN <=0.5 SENSITIVE Sensitive     ERYTHROMYCIN 0.5 SENSITIVE Sensitive     GENTAMICIN <=0.5 SENSITIVE Sensitive     OXACILLIN RESISTANT Resistant     TETRACYCLINE <=1 SENSITIVE Sensitive     VANCOMYCIN <=0.5 SENSITIVE Sensitive     TRIMETH/SULFA <=10 SENSITIVE Sensitive     CLINDAMYCIN <=0.25 SENSITIVE Sensitive     RIFAMPIN <=0.5 SENSITIVE Sensitive     Inducible Clindamycin NEGATIVE Sensitive     * STAPHYLOCOCCUS CAPITIS  Blood culture (routine x 2)     Status: Abnormal   Collection Time: 09/27/21  7:33 PM   Specimen: Right Antecubital; Blood  Result Value Ref Range Status   Specimen Description   Final    RIGHT ANTECUBITAL Performed at [facility], [address] [place], [place] [address]    Special Requests   Final    BOTTLES DRAWN AEROBIC AND ANAEROBIC Blood Culture results may not be optimal due to an inadequate volume of blood received in culture bottles Performed at [facility], [address] [place], [place] [address]    Culture  Setup Time   Final    IN BOTH AEROBIC AND ANAEROBIC BOTTLES GRAM POSITIVE COCCI CRITICAL VALUE NOTED.  VALUE IS CONSISTENT WITH PREVIOUSLY REPORTED AND CALLED VALUE. SCS 09/28/21     Culture (A)  Final    STAPHYLOCOCCUS CAPITIS SUSCEPTIBILITIES PERFORMED ON PREVIOUS CULTURE WITHIN THE LAST 5 DAYS. Performed at [facility], [place] [place],  [address]    Report Status 10/02/2021 FINAL  Final  Blood Culture ID Panel (Reflexed)     Status: Abnormal   Collection Time: 09/27/21  7:33 PM  Result Value Ref Range Status   Enterococcus faecalis NOT DETECTED NOT DETECTED Final   Enterococcus Faecium NOT DETECTED NOT DETECTED Final   Listeria monocytogenes NOT DETECTED NOT DETECTED Final   Staphylococcus species DETECTED (A) NOT DETECTED Final    Comment: CRITICAL RESULT CALLED TO, READ BACK BY AND VERIFIED WITH: SUSAN WATSON @1432  09/28/21 SCS    Staphylococcus aureus (BCID)  NOT DETECTED NOT DETECTED Final   Staphylococcus epidermidis NOT DETECTED NOT DETECTED Final   Staphylococcus lugdunensis NOT DETECTED NOT DETECTED Final   Streptococcus species NOT DETECTED NOT DETECTED Final   Streptococcus agalactiae NOT DETECTED NOT DETECTED Final   Streptococcus pneumoniae NOT DETECTED NOT DETECTED Final   Streptococcus pyogenes NOT DETECTED NOT DETECTED Final   A.calcoaceticus-baumannii NOT DETECTED NOT DETECTED Final   Bacteroides fragilis NOT DETECTED NOT DETECTED Final   Enterobacterales NOT DETECTED NOT DETECTED Final   Enterobacter cloacae complex NOT DETECTED NOT DETECTED Final   Escherichia coli NOT DETECTED NOT DETECTED Final   Klebsiella aerogenes NOT DETECTED NOT DETECTED Final   Klebsiella oxytoca NOT DETECTED  NOT DETECTED Final   Klebsiella pneumoniae NOT DETECTED NOT DETECTED Final   Proteus species NOT DETECTED NOT DETECTED Final   Salmonella species NOT DETECTED NOT DETECTED Final   Serratia marcescens NOT DETECTED NOT DETECTED Final   Haemophilus influenzae NOT DETECTED NOT DETECTED Final   Neisseria meningitidis NOT DETECTED NOT DETECTED Final   Pseudomonas aeruginosa NOT DETECTED NOT DETECTED Final   Stenotrophomonas maltophilia NOT DETECTED NOT DETECTED Final   Candida albicans NOT DETECTED NOT DETECTED Final   Candida auris NOT DETECTED NOT DETECTED Final   Candida glabrata NOT DETECTED NOT DETECTED Final   Candida krusei NOT DETECTED NOT DETECTED Final   Candida parapsilosis NOT DETECTED NOT DETECTED Final   Candida tropicalis NOT DETECTED NOT DETECTED Final   Cryptococcus neoformans/gattii NOT DETECTED NOT DETECTED Final    Comment: Performed at [facility], [address] [place], [place] [address]  MRSA Next Gen by PCR, Nasal     Status: None   Collection Time: 09/28/21  2:00 PM  Result Value Ref Range Status   MRSA by PCR Next Gen NOT DETECTED NOT DETECTED Final    Comment: (NOTE) The GeneXpert MRSA Assay (FDA approved for  NASAL specimens only), is one component of a comprehensive MRSA colonization surveillance program. It is not intended to diagnose MRSA infection nor to guide or monitor treatment for MRSA infections. Test performance is not FDA approved in patients less than 64 years old. Performed at [facility], [address] [place], [place] [address]   CULTURE, BLOOD (ROUTINE X 2) w Reflex to ID Panel     Status: None   Collection Time: 09/29/21  1:29 PM   Specimen: BLOOD  Result Value Ref Range Status   Specimen Description BLOOD [facility]  Final   Special Requests BOTTLES DRAWN AEROBIC AND ANAEROBIC BCLV  Final   Culture   Final    NO GROWTH 5 DAYS Performed at [facility], [address] [place], [place] [address]    Report Status 10/04/2021 FINAL  Final  CULTURE, BLOOD (ROUTINE X 2) w Reflex to ID Panel     Status: None   Collection Time: 09/29/21  3:25 PM   Specimen: BLOOD  Result Value Ref Range Status   Specimen Description BLOOD [facility]  Final   Special Requests BOTTLES DRAWN AEROBIC AND ANAEROBIC BCAV  Final   Culture   Final    NO GROWTH 5 DAYS Performed at [facility], [address] [place],  [address]    Report Status 10/04/2021 FINAL  Final         Radiology Studies: No results found.      Scheduled Meds: Continuous Infusions:   LOS: 9 days    Time spent: 25 minutes    Sharen Hones, MD [facility]   To contact the attending provider between 7A-7P or the covering provider during after hours 7P-7A, please log into the web site [email or web address] and access using universal [place] password for that web site. If you do not have the password, please call the hospital operator.  10/06/2021, 10:11 AM

## 2021-10-06 NOTE — TOC Progression Note (Addendum)
Transition of Care [facility]) - Progression Note    Patient Details  Name: Crystal Faulkner MRN: [id number] Date of Birth: Mar 04, 1925  [facility] CM/SW Steen, LCSW Phone Number: 10/06/2021, 12:04 PM  Clinical Narrative:   TOC continues to follow for DC with hospice care. Per rounds, need to determine if patient is eligible for hospice home or would need to go home with hospice. Reached out to [facility]. Per Santiago Glad, they will need to assess closer to 2/14. Patient could not go to hospice home until 2/14 due to COVID according to notes.    Expected Discharge Plan: Skilled Nursing Facility Barriers to Discharge: Continued Medical Work up  Expected Discharge Plan and Services Expected Discharge Plan: [place]                                               Social Determinants of Health (SDOH) Interventions    Readmission Risk Interventions No flowsheet data found.

## 2021-10-07 DIAGNOSIS — J1282 Pneumonia due to coronavirus disease 2019: Secondary | ICD-10-CM | POA: Diagnosis not present

## 2021-10-07 DIAGNOSIS — N179 Acute kidney failure, unspecified: Secondary | ICD-10-CM | POA: Diagnosis not present

## 2021-10-07 DIAGNOSIS — U071 COVID-19: Secondary | ICD-10-CM | POA: Diagnosis not present

## 2021-10-07 DIAGNOSIS — J9601 Acute respiratory failure with hypoxia: Secondary | ICD-10-CM | POA: Diagnosis not present

## 2021-10-07 NOTE — Progress Notes (Signed)
PROGRESS NOTE    Crystal Faulkner  OEV:[id number] DOB: [birth year] DOA: 09/27/2021 PCP: Steele Sizer, MD    Brief Narrative:  Crystal Faulkner is a 88 y.o. female presenting with AMS and recent covid diagnosis on Monday and since then pt has been declining with mentation and generalized weakness and worsening mental status and somnolent.. She has a very poor appetite, not to be eating.  Transition to comfort care 2/8.   Assessment & Plan:   Principal Problem:   Pneumonia Active Problems:   Coronary artery disease   Benign hypertension   Adult hypothyroidism   Acute kidney injury superimposed on chronic kidney disease (HCC)   AMS (altered mental status)   History of CVA in adulthood   Acute respiratory failure with hypoxia (HCC)   Metabolic acidosis Covid pneumonia. Community-acquired pneumonia. Staphylococcus capitis bacteremia. Acute hypoxemic respiratory failure Hyponatremia. Failure to thrive. Acute kidney injury superimposed on chronic kidney disease stage V. Anemia of chronic kidney disease. Coronary artery disease Chronic diastolic congestive heart failure. Hypothyroidism.  Patient is comfortable today, he continues to eat.  No change in treatment plan.  Patient is awaiting for hospice transfer on Tuesday after [place] quarantine.       I/O last 3 completed shifts: In: -  Out: 650 [Urine:650] No intake/output data recorded.     Subjective: She is nonverbal, currently does not have any pain.  No shortness of breath.  Comfortable.  Objective: Vitals:   10/05/21 0758 10/06/21 0320 10/06/21 0913 10/06/21 1540  BP: (!) 173/91 (!) 147/72 129/74   Pulse: 80 82 72 (!) 111  Resp: 16 12 16    Temp: 98.4 F (36.9 C)  (!) 96.6 F (35.9 C)   TempSrc: Oral  Axillary   SpO2: 98% 98% (!) 80% 93%  Weight:      Height:        Intake/Output Summary (Last 24 hours) at 10/07/2021 1108 Last data filed at 10/07/2021 0400 Gross per 24 hour  Intake --  Output 150 ml   Net -150 ml   Filed Weights   09/27/21 1846 09/30/21 2100  Weight: 52 kg 50.2 kg    Examination:  General exam: Appears calm and comfortable  Respiratory system: Clear to auscultation. Respiratory effort normal. Cardiovascular system: S1 & S2 heard, RRR. No JVD, murmurs, rubs, gallops or clicks. No pedal edema. Gastrointestinal system: Abdomen is nondistended, soft and nontender. No organomegaly or masses felt. Normal bowel sounds heard. Central nervous system: Alert and nonverbal. Extremities: Symmetric 5 x 5 power. Skin: No rashes, lesions or ulcers     Data Reviewed: I have personally reviewed following labs and imaging studies  CBC: Recent Labs  Lab 10/01/21 0501 10/02/21 0634  WBC 10.7* 10.4  HGB 7.1* 7.7*  HCT 23.5* 25.3*  MCV 100.9* 98.1  PLT 219 829   Basic Metabolic Panel: Recent Labs  Lab 10/01/21 0501 10/02/21 0634  NA 151* 147*  K 3.9 3.5  CL 126* 123*  CO2 18* 18*  GLUCOSE 161* 120*  BUN 86* 83*  CREATININE 3.79* 3.42*  CALCIUM 8.5* 8.4*   GFR: Estimated Creatinine Clearance: 7.2 mL/min (A) (by C-G formula based on SCr of 3.42 mg/dL (H)). Liver Function Tests: No results for input(s): AST, ALT, ALKPHOS, BILITOT, PROT, ALBUMIN in the last 168 hours. No results for input(s): LIPASE, AMYLASE in the last 168 hours. No results for input(s): AMMONIA in the last 168 hours. Coagulation Profile: No results for input(s): INR, PROTIME in the last 168 hours.  Cardiac Enzymes: No results for input(s): CKTOTAL, CKMB, CKMBINDEX, TROPONINI in the last 168 hours. BNP (last 3 results) No results for input(s): PROBNP in the last 8760 hours. HbA1C: No results for input(s): HGBA1C in the last 72 hours. CBG: No results for input(s): GLUCAP in the last 168 hours. Lipid Profile: No results for input(s): CHOL, HDL, LDLCALC, TRIG, CHOLHDL, LDLDIRECT in the last 72 hours. Thyroid Function Tests: No results for input(s): TSH, T4TOTAL, FREET4, T3FREE, THYROIDAB in  the last 72 hours. Anemia Panel: No results for input(s): VITAMINB12, FOLATE, FERRITIN, TIBC, IRON, RETICCTPCT in the last 72 hours. Sepsis Labs: No results for input(s): PROCALCITON, LATICACIDVEN in the last 168 hours.  Recent Results (from the past 240 hour(s))  Resp Panel by RT-PCR (Flu A&B, Covid) Nasopharyngeal Swab     Status: Abnormal   Collection Time: 09/27/21  7:33 PM   Specimen: Nasopharyngeal Swab; Nasopharyngeal(NP) swabs in vial transport medium  Result Value Ref Range Status   SARS Coronavirus 2 by RT PCR POSITIVE (A) NEGATIVE Final    Comment: (NOTE) SARS-CoV-2 target nucleic acids are DETECTED.  The SARS-CoV-2 RNA is generally detectable in upper respiratory specimens during the acute phase of infection. Positive results are indicative of the presence of the identified virus, but do not rule out bacterial infection or co-infection with other pathogens not detected by the test. Clinical correlation with patient history and other diagnostic information is necessary to determine patient infection status. The expected result is Negative.  Fact Sheet for Patients: EntrepreneurPulse.com.au  Fact Sheet for Healthcare Providers: IncredibleEmployment.be  This test is not yet approved or cleared by the [place] FDA and  has been authorized for detection and/or diagnosis of SARS-CoV-2 by FDA under an Emergency Use Authorization (EUA).  This EUA will remain in effect (meaning this test can be used) for the duration of  the COVID-19 declaration under Section 564(b)(1) of the A ct, 21 U.S.C. section 360bbb-3(b)(1), unless the authorization is terminated or revoked sooner.     Influenza A by PCR NEGATIVE NEGATIVE Final   Influenza B by PCR NEGATIVE NEGATIVE Final    Comment: (NOTE) The Xpert Xpress SARS-CoV-2/FLU/RSV plus assay is intended as an aid in the diagnosis of influenza from Nasopharyngeal swab specimens and should not be  used as a sole basis for treatment. Nasal washings and aspirates are unacceptable for Xpert Xpress SARS-CoV-2/FLU/RSV testing.  Fact Sheet for Patients: EntrepreneurPulse.com.au  Fact Sheet for Healthcare Providers: IncredibleEmployment.be  This test is not yet approved or cleared by the [place] FDA and has been authorized for detection and/or diagnosis of SARS-CoV-2 by FDA under an Emergency Use Authorization (EUA). This EUA will remain in effect (meaning this test can be used) for the duration of the COVID-19 declaration under Section 564(b)(1) of the Act, 21 U.S.C. section 360bbb-3(b)(1), unless the authorization is terminated or revoked.  Performed at [facility], [address] [place], [place] [address]   Blood culture (routine x 2)     Status: Abnormal   Collection Time: 09/27/21  7:33 PM   Specimen: Left Antecubital; Blood  Result Value Ref Range Status   Specimen Description   Final    LEFT ANTECUBITAL Performed at [facility], [address] [place], [place] [address]    Special Requests   Final    BOTTLES DRAWN AEROBIC AND ANAEROBIC Blood Culture results may not be optimal due to an inadequate volume of blood received in culture bottles Performed at [facility], [address] [place], [place] [address]    Culture  Setup Time   Final    GRAM POSITIVE COCCI IN BOTH AEROBIC AND ANAEROBIC BOTTLES CRITICAL RESULT CALLED TO, READ BACK BY AND VERIFIED WITH: [place] @1432  09/28/21 SCS    Culture STAPHYLOCOCCUS CAPITIS (A)  Final   Report Status 10/01/2021 FINAL  Final   Organism ID, Bacteria STAPHYLOCOCCUS CAPITIS  Final      Susceptibility   Staphylococcus capitis - MIC*    CIPROFLOXACIN <=0.5 SENSITIVE Sensitive     ERYTHROMYCIN 0.5 SENSITIVE Sensitive     GENTAMICIN <=0.5 SENSITIVE Sensitive     OXACILLIN RESISTANT Resistant     TETRACYCLINE <=1 SENSITIVE Sensitive     VANCOMYCIN  <=0.5 SENSITIVE Sensitive     TRIMETH/SULFA <=10 SENSITIVE Sensitive     CLINDAMYCIN <=0.25 SENSITIVE Sensitive     RIFAMPIN <=0.5 SENSITIVE Sensitive     Inducible Clindamycin NEGATIVE Sensitive     * STAPHYLOCOCCUS CAPITIS  Blood culture (routine x 2)     Status: Abnormal   Collection Time: 09/27/21  7:33 PM   Specimen: Right Antecubital; Blood  Result Value Ref Range Status   Specimen Description   Final    RIGHT ANTECUBITAL Performed at [facility], [address] [place], [place] [address]    Special Requests   Final    BOTTLES DRAWN AEROBIC AND ANAEROBIC Blood Culture results may not be optimal due to an inadequate volume of blood received in culture bottles Performed at [facility], [address] [place], [place] [address]    Culture  Setup Time   Final    IN BOTH AEROBIC AND ANAEROBIC BOTTLES GRAM POSITIVE COCCI CRITICAL VALUE NOTED.  VALUE IS CONSISTENT WITH PREVIOUSLY REPORTED AND CALLED VALUE. SCS 09/28/21     Culture (A)  Final    STAPHYLOCOCCUS CAPITIS SUSCEPTIBILITIES PERFORMED ON PREVIOUS CULTURE WITHIN THE LAST 5 DAYS. Performed at [facility], [place] [address], [place],  [address]    Report Status 10/02/2021 FINAL  Final  Blood Culture ID Panel (Reflexed)     Status: Abnormal   Collection Time: 09/27/21  7:33 PM  Result Value Ref Range Status   Enterococcus faecalis NOT DETECTED NOT DETECTED Final   Enterococcus Faecium NOT DETECTED NOT DETECTED Final   Listeria monocytogenes NOT DETECTED NOT DETECTED Final   Staphylococcus species DETECTED (A) NOT DETECTED Final    Comment: CRITICAL RESULT CALLED TO, READ BACK BY AND VERIFIED WITH: SUSAN WATSON @1432  09/28/21 SCS    Staphylococcus aureus (BCID) NOT DETECTED NOT DETECTED Final   Staphylococcus epidermidis NOT DETECTED NOT DETECTED Final   Staphylococcus lugdunensis NOT DETECTED NOT DETECTED Final   Streptococcus species NOT DETECTED NOT DETECTED Final   Streptococcus  agalactiae NOT DETECTED NOT DETECTED Final   Streptococcus pneumoniae NOT DETECTED NOT DETECTED Final   Streptococcus pyogenes NOT DETECTED NOT DETECTED Final   A.calcoaceticus-baumannii NOT DETECTED NOT DETECTED Final   Bacteroides fragilis NOT DETECTED NOT DETECTED Final   Enterobacterales NOT DETECTED NOT DETECTED Final   Enterobacter cloacae complex NOT DETECTED NOT DETECTED Final   Escherichia coli NOT DETECTED NOT DETECTED Final   Klebsiella aerogenes NOT DETECTED NOT DETECTED Final   Klebsiella oxytoca NOT DETECTED NOT DETECTED Final   Klebsiella pneumoniae NOT DETECTED NOT DETECTED Final   Proteus species NOT DETECTED NOT DETECTED Final   Salmonella species NOT DETECTED NOT DETECTED Final   Serratia marcescens NOT DETECTED NOT DETECTED Final   Haemophilus influenzae NOT  DETECTED NOT DETECTED Final   Neisseria meningitidis NOT DETECTED NOT DETECTED Final   Pseudomonas aeruginosa NOT DETECTED NOT DETECTED Final   Stenotrophomonas maltophilia NOT DETECTED NOT DETECTED Final   Candida albicans NOT DETECTED NOT DETECTED Final   Candida auris NOT DETECTED NOT DETECTED Final   Candida glabrata NOT DETECTED NOT DETECTED Final   Candida krusei NOT DETECTED NOT DETECTED Final   Candida parapsilosis NOT DETECTED NOT DETECTED Final   Candida tropicalis NOT DETECTED NOT DETECTED Final   Cryptococcus neoformans/gattii NOT DETECTED NOT DETECTED Final    Comment: Performed at [facility], [address] [place], [place] [address]  MRSA Next Gen by PCR, Nasal     Status: None   Collection Time: 09/28/21  2:00 PM  Result Value Ref Range Status   MRSA by PCR Next Gen NOT DETECTED NOT DETECTED Final    Comment: (NOTE) The GeneXpert MRSA Assay (FDA approved for NASAL specimens only), is one component of a comprehensive MRSA colonization surveillance program. It is not intended to diagnose MRSA infection nor to guide or monitor treatment for MRSA infections. Test performance is not  FDA approved in patients less than 39 years old. Performed at [facility], [address] [place], [place] [address]   CULTURE, BLOOD (ROUTINE X 2) w Reflex to ID Panel     Status: None   Collection Time: 09/29/21  1:29 PM   Specimen: BLOOD  Result Value Ref Range Status   Specimen Description BLOOD [facility]  Final   Special Requests BOTTLES DRAWN AEROBIC AND ANAEROBIC BCLV  Final   Culture   Final    NO GROWTH 5 DAYS Performed at [facility], [address] [place], [place] [address]    Report Status 10/04/2021 FINAL  Final  CULTURE, BLOOD (ROUTINE X 2) w Reflex to ID Panel     Status: None   Collection Time: 09/29/21  3:25 PM   Specimen: BLOOD  Result Value Ref Range Status   Specimen Description BLOOD [facility]  Final   Special Requests BOTTLES DRAWN AEROBIC AND ANAEROBIC BCAV  Final   Culture   Final    NO GROWTH 5 DAYS Performed at [facility], [address] [place],  [address]    Report Status 10/04/2021 FINAL  Final         Radiology Studies: No results found.      Scheduled Meds: Continuous Infusions:   LOS: 10 days    Time spent: 23 minutes    Sharen Hones, MD [facility]   To contact the attending provider between 7A-7P or the covering provider during after hours 7P-7A, please log into the web site [email or web address] and access using universal [place] password for that web site. If you do not have the password, please call the hospital operator.  10/07/2021, 11:08 AM

## 2021-10-07 NOTE — Progress Notes (Addendum)
Manufacturing engineer [facility]) Hospital Liaison Note  Received request from [employer] [place] for family interest in [place]. MSW spoke with [name] to confirm interest and explain services.  Approval for Hospice Home is determined by [facility] MD. Once [facility] MD has determined Hospice Home eligibility, [place] will update hospital staff and family. Hospice eligibility is pending.  Please do not hesitate to call with any hospice related questions.    Thank you for the opportunity to participate in this patient's care.  Daphene Calamity, MSW [facility] Liaison  [phone]

## 2021-10-08 DIAGNOSIS — N179 Acute kidney failure, unspecified: Secondary | ICD-10-CM | POA: Diagnosis not present

## 2021-10-08 DIAGNOSIS — J1282 Pneumonia due to coronavirus disease 2019: Secondary | ICD-10-CM | POA: Diagnosis not present

## 2021-10-08 DIAGNOSIS — J9601 Acute respiratory failure with hypoxia: Secondary | ICD-10-CM | POA: Diagnosis not present

## 2021-10-08 DIAGNOSIS — U071 COVID-19: Secondary | ICD-10-CM | POA: Diagnosis not present

## 2021-10-08 NOTE — TOC Progression Note (Signed)
Transition of Care [facility]) - Progression Note    Patient Details  Name: Crystal Faulkner MRN: [id number] Date of Birth: [birth year]  Transition of Care [facility]) CM/SW [place], RN Phone Number: 10/08/2021, 3:27 PM  Clinical Narrative:   Family states that they are upset patient is not hospice house appropriate.  They state that they wanted her to go "for about four months".  Explained to daughters that the hospice house is designated for 2 weeks or less prognosis.    Daughter states that they do want long term care for patient, but they only want [facility].  Magda Paganini at [facility] is reviewing patient's information at this time, and will speak to Leadership tomorrow.  TOC contact information provided to family, TOC to follow    Expected Discharge Plan: [place] Barriers to Discharge: Continued Medical Work up  Expected Discharge Plan and Services Expected Discharge Plan: [place]                                               Social Determinants of Health (SDOH) Interventions    Readmission Risk Interventions No flowsheet data found.

## 2021-10-08 NOTE — Progress Notes (Signed)
[place] [facility]) Hospital Liaison Note  Reviewed patient with 2 [facility] hospice physicians. At this time patient does not meet IPU eligibility criteria but is eligible for hospice services.  Discussed with family and all questions answered. Family would like [facility] to follow for discharge disposition.  Notified Elena with TOC and Dr. Roosevelt Locks.  [facility] will continue to follow and support for discharge disposition.  Please call with any hospice related questions or concerns.  Thank you, Margaretmary Eddy, BSN, RN [facility] Liaison [phone]

## 2021-10-08 NOTE — Progress Notes (Signed)
PROGRESS NOTE    Crystal Faulkner  PYP:[id number] DOB: 1925/06/20 DOA: 09/27/2021 PCP: Steele Sizer, MD    Brief Narrative:  Crystal Faulkner is a 88 y.o. female presenting with AMS and recent covid diagnosis on Monday and since then pt has been declining with mentation and generalized weakness and worsening mental status and somnolent.. She has a very poor appetite, not to be eating.  Transition to comfort care 2/8.   Assessment & Plan:   Principal Problem:   Pneumonia Active Problems:   Coronary artery disease   Benign hypertension   Adult hypothyroidism   Acute kidney injury superimposed on chronic kidney disease (HCC)   AMS (altered mental status)   History of CVA in adulthood   Acute respiratory failure with hypoxia (HCC)   Metabolic acidosis Covid pneumonia. Community-acquired pneumonia. Staphylococcus capitis bacteremia. Acute hypoxemic respiratory failure Hypernatremia. Failure to thrive. Acute kidney injury superimposed on chronic kidney disease stage V. Anemia of chronic kidney disease. Coronary artery disease Chronic diastolic congestive heart failure. Hypothyroidism.  Patient was evaluated by hospice again this morning, she no longer qualified for inpatient hospice.  She might live longer than 2 weeks as she has been eating better. I have discussion with the family again, we will continue comfort care.  Discussed with case management, looking for nursing home with hospice.      I/O last 3 completed shifts: In: -  Out: 450 [Urine:450] No intake/output data recorded.    Subjective: Family has been feeding patient, she was able to eat and drink small amount of food. She is nonverbal, but awake.  No discomfort.  Objective: Vitals:   10/06/21 0913 10/06/21 1540 10/08/21 0505 10/08/21 0738  BP: 129/74  (!) 164/80 (!) 148/74  Pulse: 72 (!) 111 67 64  Resp: 16  17 18   Temp: (!) 96.6 F (35.9 C)  97.8 F (36.6 C) 97.9 F (36.6 C)  TempSrc: Axillary   Oral Oral  SpO2: (!) 80% 93% 100% 100%  Weight:      Height:        Intake/Output Summary (Last 24 hours) at 10/08/2021 1217 Last data filed at 10/08/2021 0505 Gross per 24 hour  Intake --  Output 300 ml  Net -300 ml   Filed Weights   09/27/21 1846 09/30/21 2100  Weight: 52 kg 50.2 kg    Examination:  General exam: Appears calm and comfortable  Respiratory system: Clear to auscultation. Respiratory effort normal. Cardiovascular system: S1 & S2 heard, RRR. No JVD, murmurs, rubs, gallops or clicks. No pedal edema. Gastrointestinal system: Abdomen is nondistended, soft and nontender. No organomegaly or masses felt. Normal bowel sounds heard. Central nervous system: Alert and nonverbal. Extremities: Symmetric 5 x 5 power. Skin: No rashes, lesions or ulcers    Data Reviewed: I have personally reviewed following labs and imaging studies  CBC: Recent Labs  Lab 10/02/21 0634  WBC 10.4  HGB 7.7*  HCT 25.3*  MCV 98.1  PLT 245   Basic Metabolic Panel: Recent Labs  Lab 10/02/21 0634  NA 147*  K 3.5  CL 123*  CO2 18*  GLUCOSE 120*  BUN 83*  CREATININE 3.42*  CALCIUM 8.4*   GFR: Estimated Creatinine Clearance: 7.2 mL/min (A) (by C-G formula based on SCr of 3.42 mg/dL (H)). Liver Function Tests: No results for input(s): AST, ALT, ALKPHOS, BILITOT, PROT, ALBUMIN in the last 168 hours. No results for input(s): LIPASE, AMYLASE in the last 168 hours. No results for input(s): AMMONIA in  the last 168 hours. Coagulation Profile: No results for input(s): INR, PROTIME in the last 168 hours. Cardiac Enzymes: No results for input(s): CKTOTAL, CKMB, CKMBINDEX, TROPONINI in the last 168 hours. BNP (last 3 results) No results for input(s): PROBNP in the last 8760 hours. HbA1C: No results for input(s): HGBA1C in the last 72 hours. CBG: No results for input(s): GLUCAP in the last 168 hours. Lipid Profile: No results for input(s): CHOL, HDL, LDLCALC, TRIG, CHOLHDL, LDLDIRECT in  the last 72 hours. Thyroid Function Tests: No results for input(s): TSH, T4TOTAL, FREET4, T3FREE, THYROIDAB in the last 72 hours. Anemia Panel: No results for input(s): VITAMINB12, FOLATE, FERRITIN, TIBC, IRON, RETICCTPCT in the last 72 hours. Sepsis Labs: No results for input(s): PROCALCITON, LATICACIDVEN in the last 168 hours.  Recent Results (from the past 240 hour(s))  MRSA Next Gen by PCR, Nasal     Status: None   Collection Time: 09/28/21  2:00 PM  Result Value Ref Range Status   MRSA by PCR Next Gen NOT DETECTED NOT DETECTED Final    Comment: (NOTE) The GeneXpert MRSA Assay (FDA approved for NASAL specimens only), is one component of a comprehensive MRSA colonization surveillance program. It is not intended to diagnose MRSA infection nor to guide or monitor treatment for MRSA infections. Test performance is not FDA approved in patients less than 79 years old. Performed at [facility], [address] [place], [place] [address]   CULTURE, BLOOD (ROUTINE X 2) w Reflex to ID Panel     Status: None   Collection Time: 09/29/21  1:29 PM   Specimen: BLOOD  Result Value Ref Range Status   Specimen Description BLOOD [facility]  Final   Special Requests BOTTLES DRAWN AEROBIC AND ANAEROBIC BCLV  Final   Culture   Final    NO GROWTH 5 DAYS Performed at [facility], [address] [place], [place] [address]    Report Status 10/04/2021 FINAL  Final  CULTURE, BLOOD (ROUTINE X 2) w Reflex to ID Panel     Status: None   Collection Time: 09/29/21  3:25 PM   Specimen: BLOOD  Result Value Ref Range Status   Specimen Description BLOOD [facility]  Final   Special Requests BOTTLES DRAWN AEROBIC AND ANAEROBIC BCAV  Final   Culture   Final    NO GROWTH 5 DAYS Performed at [facility], [address] [place], [place] [address]    Report Status 10/04/2021 FINAL  Final         Radiology Studies: No results found.      Scheduled Meds: Continuous Infusions:    LOS: 11 days    Time spent: 28 minutes    Sharen Hones, MD [facility]   To contact the attending provider between 7A-7P or the covering provider during after hours 7P-7A, please log into the web site [email or web address] and access using universal [place] password for that web site. If you do not have the password, please call the hospital operator.  10/08/2021, 12:17 PM

## 2021-10-09 DIAGNOSIS — R7881 Bacteremia: Secondary | ICD-10-CM

## 2021-10-09 DIAGNOSIS — N179 Acute kidney failure, unspecified: Secondary | ICD-10-CM | POA: Diagnosis not present

## 2021-10-09 DIAGNOSIS — Z515 Encounter for palliative care: Secondary | ICD-10-CM

## 2021-10-09 DIAGNOSIS — R627 Adult failure to thrive: Secondary | ICD-10-CM | POA: Diagnosis not present

## 2021-10-09 DIAGNOSIS — E870 Hyperosmolality and hypernatremia: Secondary | ICD-10-CM

## 2021-10-09 DIAGNOSIS — U071 COVID-19: Secondary | ICD-10-CM | POA: Diagnosis not present

## 2021-10-09 NOTE — Assessment & Plan Note (Signed)
Staphylococcus capitis bacteremia treated during the hospital course.

## 2021-10-09 NOTE — Progress Notes (Signed)
°  Progress Note   Patient: Crystal Faulkner YDX:[id number] DOB: [birth year] DOA: 09/27/2021     12 DOS: the patient was seen and examined on 10/09/2021     Assessment and Plan: * End of life care Continue as needed medications for comfort measures.  Patient is a candidate for hospice.  Failure to thrive in adult Patient with poor oral intake over the last few days.  We will get a calorie count.  The patient qualifies for hospice.  Spoke with the director at the hospice home not a candidate right at this point for hospice home.  Continue to monitor.  COVID-19 virus infection With pneumonia.  Treated during the hospital course.  Hypernatremia Last sodium 147.  This patient is comfort care measures will not check further labs.  Bacteremia Staphylococcus capitis bacteremia treated during the hospital course.  Acute kidney injury superimposed on chronic kidney disease ([place])- (present on admission) Last creatinine 3.42.  Creatinine peaked at 4.20      Subjective: Patient able to open her mouth but unable to speak at this point.  Admitted 11 days ago with altered mental status.  Physical Exam: Vitals:   10/08/21 0505 10/08/21 0738 10/08/21 1228 10/09/21 0412  BP: (!) 164/80 (!) 148/74 (!) 144/69 (!) 149/82  Pulse: 67 64 (!) 56 (!) 58  Resp: 17 18 18 14   Temp: 97.8 F (36.6 C) 97.9 F (36.6 C) 97.6 F (36.4 C) 97.8 F (36.6 C)  TempSrc: Oral Oral Oral Oral  SpO2: 100% 100% 100% 100%  Weight:      Height:       Physical Exam HENT:     Head: Normocephalic.     Mouth/Throat:     Pharynx: No oropharyngeal exudate.  Eyes:     General: Lids are normal.     Conjunctiva/sclera: Conjunctivae normal.  Cardiovascular:     Rate and Rhythm: Normal rate and regular rhythm.     Heart sounds: Normal heart sounds, S1 normal and S2 normal.  Pulmonary:     Breath sounds: No decreased breath sounds, wheezing, rhonchi or rales.  Abdominal:     Palpations: Abdomen is soft.     Tenderness:  There is no abdominal tenderness.  Musculoskeletal:     Right lower leg: No swelling.     Left lower leg: No swelling.  Skin:    General: Skin is warm.     Findings: No rash.  Neurological:     Mental Status: She is lethargic.     Data Reviewed: Laboratory and radiological data reviewed during the hospital course  Family Communication: Spoke with 3 daughters at the bedside this morning and 1 daughter on the phone this afternoon  Disposition: Status is: Inpatient Remains inpatient appropriate because: The patient is hospice appropriate but need to find the next place to go.  Planned Discharge Destination: Skilled nursing facility  Author: Loletha Grayer, MD 10/09/2021 2:34 PM  For on call review [email or web address].

## 2021-10-09 NOTE — TOC Progression Note (Signed)
Transition of Care [facility]) - Progression Note    Patient Details  Name: Crystal Faulkner MRN: [id number] Date of Birth: 02-09-1925  Transition of Care [facility]) CM/SW [place], RN Phone Number: 10/09/2021, 4:19 PM  Clinical Narrative:    ACC reviewed and confirmed patient is not hospice home appropriate. Family is discussing dc options.    Expected Discharge Plan: Skilled Nursing Facility Barriers to Discharge: Continued Medical Work up  Expected Discharge Plan and Services Expected Discharge Plan: [place]                                               Social Determinants of Health (SDOH) Interventions    Readmission Risk Interventions No flowsheet data found.

## 2021-10-09 NOTE — Assessment & Plan Note (Addendum)
With a calorie count over the last couple days only ate less than 20% of her meals.  This will be unable to keep up with her nutritional needs.  The patient has not eaten anything over the last 2 days.

## 2021-10-09 NOTE — Progress Notes (Signed)
Patient consumed the following amount of lunch, assisted by the family  15% of Magic Cup 100% pureed Pears 10% pureed [place] 10% pureed Mashed potatoes 10% pureed Green beans 25% of Tea

## 2021-10-09 NOTE — Progress Notes (Signed)
Patient consumed the following amount of breakfast today with the assistance of family.  25% of pureed pancakes with 25% of syrup 25% of pureed sausage 50% of coffee black.

## 2021-10-09 NOTE — TOC Progression Note (Signed)
Transition of Care [facility]) - Progression Note    Patient Details  Name: Crystal Faulkner MRN: [id number] Date of Birth: [birth year]  [facility]) CM/SW Contact  Anselm Pancoast, RN Phone Number: 10/09/2021, 1:15 PM  Clinical Narrative:    Ronne Binning to Olivia Mackie @ [facility] to discuss MD interest in [facility]. MD agreed to complete peer to peer to discuss with [facility] [place] making decision on hospice eligibility.    Expected Discharge Plan: Skilled Nursing Facility Barriers to Discharge: Continued Medical Work up  Expected Discharge Plan and Services Expected Discharge Plan: [place]                                               Social Determinants of Health (SDOH) Interventions    Readmission Risk Interventions No flowsheet data found.

## 2021-10-09 NOTE — Assessment & Plan Note (Signed)
With pneumonia.  Treated during the hospital course.

## 2021-10-09 NOTE — Progress Notes (Signed)
Patient consumed the following amount of lunch, assisted by the family.  25% of Soup 10% of Mac-n-cheese 10% of broccoli 10% of magic cup 75% of tea

## 2021-10-09 NOTE — Assessment & Plan Note (Signed)
Last sodium 147.  This patient is comfort care measures will not check further labs.

## 2021-10-09 NOTE — Plan of Care (Signed)
Per Pt's daughter, she's hardly had anything to eat or drink today - 2/14.  Pain medication given 1x this shift.

## 2021-10-09 NOTE — Assessment & Plan Note (Addendum)
Continue as needed medications for comfort measures.  Case discussed with hospice liaison today and now a candidate for the hospice home and they will take to the hospice facility today.

## 2021-10-09 NOTE — Progress Notes (Addendum)
[facility]) Hospital Liaison Note   Reviewed by MD/Dr. Leslye Peer and [facility] provider.  At this time, patient does not meet IPU eligibility criteria but is eligible for hospice services. Bedside meeting with family and reiterated hospice process and additional education. Family receptive and voiced understanding and appreciative of conversation. No further questions/concerns reported.  TOC aware of above updates.    ACC will continue to follow and support for discharge disposition.   Please call with any hospice related questions or concerns.   Thank you Daphene Calamity, MSW [facility] Liaison [phone]

## 2021-10-09 NOTE — Progress Notes (Signed)
Calorie Count Note  48 hour calorie count ordered.  Diet: dysphagia 1  Case discussed with MD; pt is comfort care but does not qualify for residential hospice facility secondary to improved oral intake. Per chart review, pt consuming bites and sips. Documented meal completions 25% at best. Based upon nursing report and meal documentation, this is not enough to sustain life. MD requesting RD complete calorie count to help with hospice home eligibility.   Loistine Chance, RD, LDN, [place] Registered Dietitian II Certified Diabetes Care and Education Specialist Please refer to [facility] for RD and/or RD on-call/weekend/after hours pager

## 2021-10-09 NOTE — Progress Notes (Signed)
Calorie Count Note  48 hour calorie count ordered.  Diet: dysphagia 1  2/15 Breakfast: 98 kcals, 7 grams protein Lunch: 172 kcals, 3 grams  Total intake: 270 kcal (22% of minimum estimated needs)  10 grams protein (17% of minimum estimated needs)  Nutrition Dx: Inadequate oral intake related to poor appetite as evidenced by meal completions <25%  Goal: Pt will meet 90% of estimated energy needs  Estimated nutritional needs:   1250-1450 kcals 60-75 grams  > 1.2 L fluid   Intervention:   -Continue 48 hour calorie count  Loistine Chance, RD, LDN, [place] Registered Dietitian II Certified Diabetes Care and Education Specialist Please refer to [facility] for RD and/or RD on-call/weekend/after hours pager

## 2021-10-10 DIAGNOSIS — E870 Hyperosmolality and hypernatremia: Secondary | ICD-10-CM | POA: Diagnosis not present

## 2021-10-10 DIAGNOSIS — Z515 Encounter for palliative care: Secondary | ICD-10-CM | POA: Diagnosis not present

## 2021-10-10 DIAGNOSIS — U071 COVID-19: Secondary | ICD-10-CM | POA: Diagnosis not present

## 2021-10-10 DIAGNOSIS — R627 Adult failure to thrive: Secondary | ICD-10-CM | POA: Diagnosis not present

## 2021-10-10 MED ORDER — HYDROMORPHONE HCL 1 MG/ML PO LIQD: 0.5000 mg | ORAL | Status: DC | PRN

## 2021-10-10 NOTE — Progress Notes (Signed)
°  Progress Note   Patient: Crystal Faulkner VAN:[id number] DOB: 31-Jan-1925 DOA: 09/27/2021     13 DOS: the patient was seen and examined on 10/10/2021     Assessment and Plan: * End of life care Continue as needed medications for comfort measures.  Patient is a candidate for hospice. Transitional care team looking into nursing home facility that can take the patient with hospice care.  If needed will use liquid oral Dilaudid for pain or shortness of breath.  The IV Dilaudid was too strong for her and cause altered mental status.  Failure to thrive in adult Case discussed with dietitian and patient is eating less than 20% of her meals which would be an adequate to keep up with her nutritional needs.  COVID-19 virus infection With pneumonia.  Treated during the hospital course.  Hypernatremia Last sodium 147.  This patient is comfort care measures will not check further labs.  Bacteremia Staphylococcus capitis bacteremia treated during the hospital course.  Acute kidney injury superimposed on chronic kidney disease ([place])- (present on admission) Last creatinine 3.42.  Creatinine peaked at 4.20         Subjective: Patient ate some breakfast this morning and then fell asleep.  She did open her eyes for me but did not say anything.  Able to open her mouth.  Physical Exam: Vitals:   10/08/21 1228 10/09/21 0412 10/09/21 1551 10/10/21 0547  BP: (!) 144/69 (!) 149/82 (!) 138/92 139/75  Pulse: (!) 56 (!) 58 89 91  Resp: 18 14 16 14   Temp: 97.6 F (36.4 C) 97.8 F (36.6 C) (!) 97.5 F (36.4 C) 98.8 F (37.1 C)  TempSrc: Oral Oral Oral Oral  SpO2: 100% 100% 99% 98%  Weight:      Height:       Physical Exam HENT:     Head: Normocephalic.     Mouth/Throat:     Pharynx: No oropharyngeal exudate.  Eyes:     General: Lids are normal.     Conjunctiva/sclera: Conjunctivae normal.  Cardiovascular:     Rate and Rhythm: Normal rate and regular rhythm.     Heart sounds: Normal heart  sounds, S1 normal and S2 normal.  Pulmonary:     Breath sounds: No decreased breath sounds, wheezing, rhonchi or rales.  Abdominal:     Palpations: Abdomen is soft.     Tenderness: There is no abdominal tenderness.  Musculoskeletal:     Right lower leg: No swelling.     Left lower leg: No swelling.  Skin:    General: Skin is warm.     Findings: No rash.  Neurological:     Mental Status: She is lethargic.     Data Reviewed:  There are no new results to review at this time.  Family Communication: Spoke with 2 daughters at the bedside  Disposition: Status is: Inpatient Remains inpatient appropriate because: Patient being treated for end-of-life care and just looking for the next place to be.  Currently not a candidate for the hospice home.  Planned Discharge Destination: Skilled nursing facility with hospice to follow.  Author: Loletha Grayer, MD 10/10/2021 3:14 PM  For on call review [email or web address].

## 2021-10-10 NOTE — TOC Progression Note (Addendum)
Transition of Care [facility]) - Progression Note    Patient Details  Name: CARRINGTON MULLENAX MRN: [id number] Date of Birth: 03/30/1925  Transition of Care [facility]) CM/SW [place], RN Phone Number: 10/10/2021, 3:18 PM  Clinical Narrative:   RNCM in to see daughter, who states that she is aware that patient is not hospice house appropriate, as per [facility].  Daughter states they do not want to take patient home with [facility], in preparation for Hospice House.  They would like patient to go to [facility].  RNCM spoke to [place] at [facility], who stated that they could accommodate patient with [facility] (family would have to change hospice providers).  And as per Magda Paganini, she would speak to family re: financial coverage for LTC.  Daughter will call [place] to discuss situation with Magda Paganini, and advise case management of their decision.  TOC to follow.    Addendum:  Patient's daughters spoke to [facility], who asked for financial information for Long Term Placement.  Family states they do not want to pay or surrender patient's check.  Magda Paganini from [employer] that she cannot make a bed offer without information on payment.    [facility] offered to family, family declined, they do not want patient to be that distance from them.  Family continues to waiver between [place], SNF and Long term care.  TOC supervisor aware, and will speak to family.   Expected Discharge Plan: Skilled Nursing Facility Barriers to Discharge: Continued Medical Work up  Expected Discharge Plan and Services Expected Discharge Plan: [place]                                               Social Determinants of Health (SDOH) Interventions    Readmission Risk Interventions No flowsheet data found.

## 2021-10-10 NOTE — Progress Notes (Addendum)
Calorie Count Note   48 hour calorie count ordered.   Diet: dysphagia 1  Results discussed with MD.    2/15 Breakfast: 98 kcals, 7 grams protein Lunch: 172 kcals, 3 grams protein Dinner: 146 kcals, 6 grams protein   Total intake: 416 kcal (33% of minimum estimated needs)  16 grams protein (27% of minimum estimated needs)  2/1 Breakfast: 0 kcals, 0 grams protein Lunch: 0 kcals, 0 grams Dinner: nothing documented   Total intake: 0 kcal (0% of minimum estimated needs)  0 grams protein (0% of minimum estimated needs)  Average Total intake: 208 kcal (17% of minimum estimated needs)  8 grams protein (13% of minimum estimated needs)   Nutrition Dx: Inadequate oral intake related to poor appetite as evidenced by meal completions <25%; Ongoing   Goal: Pt will meet 90% of estimated energy needs; unmet   Estimated nutritional needs:    1250-1450 kcals 60-75 grams  > 1.2 L fluid    Intervention:    -D/c calorie count   Loistine Chance, RD, LDN, [place] Registered Dietitian II Certified Diabetes Care and Education Specialist Please refer to [facility] for RD and/or RD on-call/weekend/after hours pager

## 2021-10-10 NOTE — Care Management Important Message (Signed)
Important Message  Patient Details  Name: Crystal Faulkner MRN: [id number] Date of Birth: 30-Jun-1925   Medicare Important Message Given:  Other (see comment)  On comfort care measures.  Medicare IM withheld out of respect for patient and family.   Dannette Barbara 10/10/2021, 12:11 PM

## 2021-10-10 NOTE — Progress Notes (Addendum)
[facility] [facility]) Hospital Liaison Note   Bedside visit held with family and patient in upright position in her bed eating lunch upon MSW entry. Casual conversation held with family and family reports they are now pursuing LTC placement vs. IPU. Family understanding of hospice services in LTC facility and care provided is dependent upon facility. No further questions/concerns reported.   ACC will continue to follow and support for discharge disposition.   Please call with any hospice related questions or concerns.   Thank you, Daphene Calamity, MSW [facility] Liaison [phone]

## 2021-10-10 NOTE — TOC Progression Note (Addendum)
Transition of Care [facility]) - Progression Note    Patient Details  Name: Crystal Faulkner MRN: [id number] Date of Birth: Dec 28, 1924  Transition of Care [facility]) CM/SW Contact  Anselm Pancoast, RN Phone Number: 10/10/2021, 4:28 PM  Clinical Narrative:    Received call from granddaughter, Lattie Haw @ [phone] requesting clarification on financial part of LTC placement. Reports patients daughter is confused and seeking clarification. RN CM outreached to [place] @ [facility] and confirmed [facility] has not yet reviewed for possible bed offer due to inability to get confirmation on financial portion.   Magda Paganini confirmed if patient came under LTC benefit SNF would require $255/day copay for the remainder of the month. Effective 10/23/21 Medicaid would cover with her check being held for payment. Confirmed current check is around $900. Family open to possible other facilities if [facility] without a bed to offer.   Will attempt to plan family meeting with [facility] Friday.    Expected Discharge Plan: Skilled Nursing Facility Barriers to Discharge: Continued Medical Work up  Expected Discharge Plan and Services Expected Discharge Plan: [place]                                               Social Determinants of Health (SDOH) Interventions    Readmission Risk Interventions No flowsheet data found.

## 2021-10-11 DIAGNOSIS — E870 Hyperosmolality and hypernatremia: Secondary | ICD-10-CM | POA: Diagnosis not present

## 2021-10-11 DIAGNOSIS — R627 Adult failure to thrive: Secondary | ICD-10-CM | POA: Diagnosis not present

## 2021-10-11 DIAGNOSIS — Z515 Encounter for palliative care: Secondary | ICD-10-CM | POA: Diagnosis not present

## 2021-10-11 DIAGNOSIS — U071 COVID-19: Secondary | ICD-10-CM | POA: Diagnosis not present

## 2021-10-11 NOTE — Progress Notes (Signed)
Patient remains minimally responsive to pain only. Has not had any oral intake or urine output during this shift thus far. Bilat feet mottled.

## 2021-10-11 NOTE — TOC Progression Note (Signed)
Transition of Care [facility]) - Progression Note    Patient Details  Name: Crystal Faulkner MRN: [id number] Date of Birth: [birth year]  Transition of Care [facility]) CM/SW [place], LCSW Phone Number: 10/11/2021, 12:37 PM  Clinical Narrative:   [place] requesting family call and speak with Colletta [place] in the [employer] about financials. Spoke with family members [name] and Lattie Haw who said they will call now.    Expected Discharge Plan: Skilled Nursing Facility Barriers to Discharge: Continued Medical Work up  Expected Discharge Plan and Services Expected Discharge Plan: [place]                                               Social Determinants of Health (SDOH) Interventions    Readmission Risk Interventions No flowsheet data found.

## 2021-10-11 NOTE — Progress Notes (Addendum)
[facility]) Hospital Liaison Note   Bedside visit held with family (daughter(s) & patient granddaughter) and TOC supervisor/Susan. Family reports that they would like to continue pursuing LTC at [facility] in hopes of transferring soon. Family continue to voice interest in [facility] in the event that patient declines prior to transfer. Family understanding of hospice services in LTC facility and care provided is dependent upon facility. No further questions/concerns reported.    ACC will continue to follow and support for discharge disposition.   Please call with any hospice related questions or concerns.   Thank you, Daphene Calamity, MSW [facility] Liaison [phone]

## 2021-10-11 NOTE — TOC Progression Note (Addendum)
Transition of Care [facility]) - Progression Note    Patient Details  Name: Crystal Faulkner MRN: [id number] Date of Birth: 06-20-1925  Transition of Care [facility]) CM/SW [place], LCSW Phone Number: 10/11/2021, 9:15 AM  Clinical Narrative:   CSW confirmed with [facility] Representative Crystal Faulkner that there is a family meeting planned for this patient today. Asked to be notified of outcome.    Expected Discharge Plan: Skilled Nursing Facility Barriers to Discharge: Continued Medical Work up  Expected Discharge Plan and Services Expected Discharge Plan: [place]                                               Social Determinants of Health (SDOH) Interventions    Readmission Risk Interventions No flowsheet data found.

## 2021-10-11 NOTE — TOC Progression Note (Signed)
Transition of Care [facility]) - Progression Note    Patient Details  Name: Crystal Faulkner MRN: [id number] Date of Birth: 1925/03/28  Transition of Care [facility]) CM/SW [place], RN Phone Number: 10/11/2021, 12:01 PM  Clinical Narrative:    Met with family and [place] at bedside to discuss discharge options. Family is open to LTC placement with Hospice following. Family understands financial responsibility and in agreeance. TOC will outreach for bed offer.    Expected Discharge Plan: Skilled Nursing Facility Barriers to Discharge: Continued Medical Work up  Expected Discharge Plan and Services Expected Discharge Plan: [place]                                               Social Determinants of Health (SDOH) Interventions    Readmission Risk Interventions No flowsheet data found.

## 2021-10-11 NOTE — Progress Notes (Signed)
°  Progress Note   Patient: Crystal Faulkner EQA:[id number] DOB: 18-Oct-1924 DOA: 09/27/2021     14 DOS: the patient was seen and examined on 10/11/2021     Assessment and Plan: * End of life care Continue as needed medications for comfort measures.  Patient is a candidate for hospice. Case discussed with hospice liaison and still not a candidate for the hospice home.  Transitional care team looking into nursing home facilities for end-of-life care.  The patient received some IV Ativan this morning and was lethargic when I saw her.  Failure to thrive in adult With a calorie count over the last couple days only ate less than 20% of her meals.  This will be unable to keep up with her nutritional needs  COVID-19 virus infection With pneumonia.  Treated during the hospital course.  Hypernatremia Last sodium 147.  This patient is comfort care measures will not check further labs.  Bacteremia Staphylococcus capitis bacteremia treated during the hospital course.  Acute kidney injury superimposed on chronic kidney disease ([place])- (present on admission) Last creatinine 3.42.  Creatinine peaked at 4.20         Subjective: Patient received some IV Ativan this morning secondary to agitation as per nursing staff.  When I walked in the room she was less responsive.  She did move around but did not open her eyes.  Physical Exam: Vitals:   10/10/21 0547 10/10/21 1527 10/10/21 1849 10/11/21 0403  BP: 139/75 131/81 (!) 142/82 (!) 143/72  Pulse: 91 81 86 73  Resp: 14 17 20 16   Temp: 98.8 F (37.1 C)   97.8 F (36.6 C)  TempSrc: Oral   Axillary  SpO2: 98% 98% 99% 100%  Weight:      Height:       Physical Exam HENT:     Head: Normocephalic.     Mouth/Throat:     Pharynx: No oropharyngeal exudate.  Eyes:     General: Lids are normal.     Conjunctiva/sclera: Conjunctivae normal.  Cardiovascular:     Rate and Rhythm: Normal rate and regular rhythm.     Heart sounds: Normal heart sounds, S1  normal and S2 normal.  Pulmonary:     Breath sounds: No decreased breath sounds, wheezing, rhonchi or rales.  Abdominal:     Palpations: Abdomen is soft.     Tenderness: There is no abdominal tenderness.  Musculoskeletal:     Right lower leg: Swelling present.     Left lower leg: Swelling present.  Skin:    General: Skin is warm.     Findings: No rash.     Comments: Bruising right lower extremity  Neurological:     Mental Status: She is lethargic.     Data Reviewed:  There are no new results to review at this time.  Family Communication: Spoke with 2 daughters at the bedside about potential options with hospice.  Disposition: Status is: Inpatient Remains inpatient appropriate because: Awaiting next location for end-of-life care.  Planned Discharge Destination: Skilled nursing facility.  Family to speak with facility about financials and then get back to transitional care team.   Author: Loletha Grayer, MD 10/11/2021 3:55 PM  For on call review [email or web address].

## 2021-10-11 NOTE — TOC Progression Note (Signed)
Transition of Care [facility]) - Progression Note    Patient Details  Name: Crystal Faulkner MRN: [id number] Date of Birth: 02-05-1925  Transition of Care [facility]) CM/SW Contact  Anselm Pancoast, RN Phone Number: 10/11/2021, 3:54 PM  Clinical Narrative:    [name] to Lattie Haw, granddaughter who reports family is working with stephanie @ [facility] however have some questions regarding finances.   RN CM left voicemail for Tammy @ [facility] to call family and answer questions.   Patient continues to be monitored for possible hospice services.    Expected Discharge Plan: Skilled Nursing Facility Barriers to Discharge: Continued Medical Work up  Expected Discharge Plan and Services Expected Discharge Plan: [place]                                               Social Determinants of Health (SDOH) Interventions    Readmission Risk Interventions No flowsheet data found.

## 2021-10-11 NOTE — TOC Progression Note (Signed)
Transition of Care [facility]) - Progression Note    Patient Details  Name: Crystal Faulkner MRN: [id number] Date of Birth: 04/04/1925  Transition of Care [facility]) CM/SW Contact  Anselm Pancoast, RN Phone Number: 10/11/2021, 10:52 AM  Clinical Narrative:    [name] with Lattie Haw, granddaughter to update on family meeting today. Both POA's will be present in the room during meeting and Lattie Haw confirmed she would be available via phone if needed. Team will review discharge options and Hospice criteria.    Expected Discharge Plan: Skilled Nursing Facility Barriers to Discharge: Continued Medical Work up  Expected Discharge Plan and Services Expected Discharge Plan: [place]                                               Social Determinants of Health (SDOH) Interventions    Readmission Risk Interventions No flowsheet data found.

## 2021-10-12 DIAGNOSIS — U071 COVID-19: Secondary | ICD-10-CM | POA: Diagnosis not present

## 2021-10-12 DIAGNOSIS — E870 Hyperosmolality and hypernatremia: Secondary | ICD-10-CM | POA: Diagnosis not present

## 2021-10-12 DIAGNOSIS — Z515 Encounter for palliative care: Secondary | ICD-10-CM | POA: Diagnosis not present

## 2021-10-12 DIAGNOSIS — R627 Adult failure to thrive: Secondary | ICD-10-CM | POA: Diagnosis not present

## 2021-10-12 MED ORDER — POLYVINYL ALCOHOL 1.4 % OP SOLN: 1.0000 [drp] | Freq: Four times a day (QID) | OPHTHALMIC | 0 refills | Status: AC | PRN

## 2021-10-12 MED ORDER — BIOTENE DRY MOUTH MT LIQD: 15.0000 mL | OROMUCOSAL | Status: AC | PRN

## 2021-10-12 MED ORDER — ACETAMINOPHEN 650 MG RE SUPP: 650.0000 mg | Freq: Four times a day (QID) | RECTAL | 0 refills | Status: AC | PRN

## 2021-10-12 MED ORDER — HALOPERIDOL LACTATE 5 MG/ML IJ SOLN: 0.5000 mg | INTRAMUSCULAR | Status: AC | PRN

## 2021-10-12 MED ORDER — GLYCOPYRROLATE 0.2 MG/ML IJ SOLN
0.2000 mg | INTRAMUSCULAR | Status: AC | PRN
Start: 2021-10-12 — End: ?

## 2021-10-12 MED ORDER — HYDROMORPHONE HCL 1 MG/ML PO LIQD
0.5000 mg | ORAL | 0 refills | Status: AC | PRN
Start: 2021-10-12 — End: ?

## 2021-10-12 MED ORDER — ONDANSETRON HCL 4 MG/2ML IJ SOLN: 4.0000 mg | Freq: Four times a day (QID) | INTRAMUSCULAR | 0 refills | Status: AC | PRN

## 2021-10-12 MED ORDER — LORAZEPAM 2 MG/ML IJ SOLN: 1.0000 mg | INTRAMUSCULAR | 0 refills | Status: AC | PRN

## 2021-10-12 NOTE — Discharge Summary (Signed)
Physician Discharge Summary   Patient: Crystal Faulkner MRN: [id number] DOB: 03-Jan-1925  Admit date:     09/27/2021  Discharge date: 10/12/21  Discharge Physician: Loletha Grayer   PCP: Steele Sizer, MD   Recommendations at discharge:   Follow-up with team at the hospice home 1 day.  Discharge Diagnoses: Principal Problem:   End of life care Active Problems:   Failure to thrive in adult   COVID-19 virus infection   Coronary artery disease   Acute kidney injury superimposed on chronic kidney disease (HCC)   AMS (altered mental status)   History of CVA in adulthood   Acute respiratory failure with hypoxia (HCC)   Metabolic acidosis   Bacteremia   Hypernatremia  Hospital Course: 88 year old female with past medical history of Alzheimer's disease, anemia, congestive heart failure, chronic kidney disease, hyperlipidemia, hypertension, hypothyroidism presented with altered mental status.  She was found to have COVID-19 virus infection on admission on 09/27/2020.  She was empirically started on antibiotics and COVID-19 treatment.  Patient was also treated for Staphylococcus capitis bacteremia during the hospital course.  She also had acute kidney injury on chronic kidney disease stage IV.  The patient was made comfort care measures on 10/02/2020.  Patient was hospice appropriate but not yet a candidate for the hospice home.  The patient has not eaten much during the entire hospital course.  Couple days ago only ate 20% of her meals and this was not able to keep up with her nutritional needs.  The patient has not eaten anything over the last 2 days.  On hospice liaison reevaluation today she was a candidate to go to the hospice home.  Assessment and Plan: * End of life care Continue as needed medications for comfort measures.  Case discussed with hospice liaison today and now a candidate for the hospice home and they will take to the hospice facility today.  Failure to thrive in adult With a  calorie count over the last couple days only ate less than 20% of her meals.  This will be unable to keep up with her nutritional needs.  The patient has not eaten anything over the last 2 days.  COVID-19 virus infection With pneumonia.  Treated during the hospital course.  Hypernatremia Last sodium 147.  This patient is comfort care measures will not check further labs.  Bacteremia Staphylococcus capitis bacteremia treated during the hospital course.  Acute kidney injury superimposed on chronic kidney disease ([place])- (present on admission) Last creatinine 3.42.  Creatinine peaked at 4.20            Consultants: Hospitalist, infectious disease Procedures performed: None Disposition: Hospice care Diet recommendation:  Dysphagia type 1   Liquid thin, as tolerated or for pleasure.  DISCHARGE MEDICATION: Allergies as of 10/12/2021   No Known Allergies      Medication List     STOP taking these medications    acetaminophen 500 MG tablet Commonly known as: TYLENOL Replaced by: acetaminophen 650 MG suppository   allopurinol 100 MG tablet Commonly known as: ZYLOPRIM   calcitRIOL 0.25 MCG capsule Commonly known as: ROCALTROL   clopidogrel 75 MG tablet Commonly known as: PLAVIX   ferrous sulfate 325 (65 FE) MG tablet   furosemide 40 MG tablet Commonly known as: Lasix   guaiFENesin 100 MG/5ML liquid Commonly known as: ROBITUSSIN   isosorbide mononitrate 60 MG 24 hr tablet Commonly known as: IMDUR   levothyroxine 50 MCG tablet Commonly known as: SYNTHROID   pantoprazole 40  MG tablet Commonly known as: PROTONIX   raloxifene 60 MG tablet Commonly known as: EVISTA   rosuvastatin 10 MG tablet Commonly known as: CRESTOR   sodium bicarbonate 650 MG tablet       TAKE these medications    acetaminophen 650 MG suppository Commonly known as: TYLENOL Place 1 suppository (650 mg total) rectally every 6 (six) hours as needed for mild pain (or Fever >/=  101). Replaces: acetaminophen 500 MG tablet   antiseptic oral rinse Liqd Apply 15 mLs topically as needed for dry mouth.   glycopyrrolate 0.2 MG/ML injection Commonly known as: ROBINUL Inject 1 mL (0.2 mg total) into the vein every 4 (four) hours as needed (excessive secretions).   haloperidol lactate 5 MG/ML injection Commonly known as: HALDOL Inject 0.1 mLs (0.5 mg total) into the vein every 4 (four) hours as needed (or delirium).   HYDROmorphone HCl 1 MG/ML Liqd Commonly known as: DILAUDID Take 0.5 mLs (0.5 mg total) by mouth every 3 (three) hours as needed for severe pain (shortness of breath).   LORazepam 2 MG/ML injection Commonly known as: ATIVAN Inject 0.5 mLs (1 mg total) into the vein every 4 (four) hours as needed for anxiety.   ondansetron 4 MG/2ML Soln injection Commonly known as: ZOFRAN Inject 2 mLs (4 mg total) into the vein every 6 (six) hours as needed for nausea.   polyvinyl alcohol 1.4 % ophthalmic solution Commonly known as: LIQUIFILM TEARS Place 1 drop into both eyes 4 (four) times daily as needed for dry eyes.        Contact information for after-discharge care     Destination     [facility] .   Service: Skilled Nursing Contact information: [place] [phone]                     Discharge Exam: Danley Danker Weights   09/27/21 1846 09/30/21 2100  Weight: 52 kg 50.2 kg   Physical Exam HENT:     Head: Normocephalic.     Mouth/Throat:     Pharynx: No oropharyngeal exudate.  Eyes:     General: Lids are normal.     Conjunctiva/sclera: Conjunctivae normal.  Cardiovascular:     Rate and Rhythm: Normal rate and regular rhythm.     Heart sounds: Normal heart sounds, S1 normal and S2 normal.  Pulmonary:     Breath sounds: Normal breath sounds. No decreased breath sounds, wheezing, rhonchi or rales.  Abdominal:     Palpations: Abdomen is soft.     Tenderness:  There is no abdominal tenderness.  Musculoskeletal:     Right lower leg: Swelling present.     Left lower leg: Swelling present.  Skin:    General: Skin is warm.     Findings: No rash.     Comments: Bruising right leg  Neurological:     Mental Status: She is lethargic.     Condition at discharge: Guarded  The results of significant diagnostics from this hospitalization (including imaging, microbiology, ancillary and laboratory) are listed below for reference.   Imaging Studies: DG Chest 2 View  Result Date: 09/23/2021 CLINICAL DATA:  COVID positive, rhinorrhea, body aches EXAM: CHEST - 2 VIEW COMPARISON:  08/07/2021 FINDINGS: Frontal and lateral views of the chest demonstrate a stable cardiac silhouette. There is marked thoracic aortic ectasia and atherosclerosis, stable. Large hiatal hernia is again noted, with increased density on the lateral view consistent with ingested  material within the hiatal hernia lumen. No acute airspace disease, effusion, or pneumothorax. No acute bony abnormalities. IMPRESSION: 1. No acute airspace disease. 2. Large hiatal hernia. Electronically Signed   By: Randa Ngo M.D.   On: 09/23/2021 19:11   DG Pelvis 1-2 Views  Result Date: 09/28/2021 CLINICAL DATA:  Trauma, fall EXAM: PELVIS - 1-2 VIEW COMPARISON:  None. FINDINGS: Slightly oblique positioning limits evaluation. As far as seen, there are no displaced fractures. There is foreshortening in the neck of right femur without definite break in the cortical margins. There is no dislocation. IMPRESSION: Technically limited study. No displaced fracture or dislocation is seen. There is foreshortening of neck of the right femur without definite fracture lines. This could easily be due to slightly oblique positioning. Less likely possibility would be fracture. Please correlate with clinical physical examination findings and consider routine radiographs of right hip including lateral view or CT scan if warranted.  Electronically Signed   By: Elmer Picker M.D.   On: 09/28/2021 10:04   DG Forearm Right  Result Date: 09/27/2021 CLINICAL DATA:  Fall this morning.  RIGHT arm pain. EXAM: RIGHT FOREARM - 2 VIEW COMPARISON:  None. FINDINGS: Displaced fracture of the ulnar styloid process, likely chronic. Radius and ulna appear otherwise intact and normally aligned throughout. Carpal bones appear grossly intact and normally aligned. Degenerative osteoarthritic changes noted at the scaphoid-trapezium joint space. IMPRESSION: 1. Displaced fracture of the ulnar styloid process, likely chronic. 2. No acute-appearing fracture or dislocation seen. 3. 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 Electronically Signed   By: Franki Cabot M.D.   On: 09/27/2021 20:24   DG Wrist Complete Right  Result Date: 09/27/2021 CLINICAL DATA:  Fall, pain. EXAM: RIGHT WRIST - COMPLETE 3+ VIEW COMPARISON:  None. FINDINGS: Chronic-appearing fracture of the ulnar styloid process. Distal radius and ulna appear otherwise intact and normally aligned. Carpal bones appear intact and normally aligned. Degenerative osteoarthritic changes noted at the scaphoid-trapezium joint space, moderate in degree with associated articular surface sclerosis. Soft tissues about the RIGHT wrist are unremarkable. IMPRESSION: 1. No acute findings. 2. Chronic-appearing fracture of the ulnar styloid process. 3. 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 Electronically Signed   By: Franki Cabot M.D.   On: 09/27/2021 20:28   DG Knee 1-2 Views Right  Result Date: 09/28/2021 CLINICAL DATA:  Trauma, fall EXAM: RIGHT KNEE - 1-2 VIEW COMPARISON:  None. FINDINGS: No recent fracture or dislocation is seen. Degenerative changes are noted, more severe in the medial compartment. There is no significant effusion in the suprapatellar bursa. Extensive arterial calcifications are seen in the soft tissues. IMPRESSION: No  recent fracture or dislocation is seen. Degenerative changes are noted more severe in the medial compartment. Electronically Signed   By: Elmer Picker M.D.   On: 09/28/2021 10:05   CT HEAD WO CONTRAST (5MM)  Result Date: 09/27/2021 CLINICAL DATA:  Mental status change, unknown cause EXAM: CT HEAD WITHOUT CONTRAST TECHNIQUE: Contiguous axial images were obtained from the base of the skull through the vertex without intravenous contrast. RADIATION DOSE REDUCTION: This exam was performed according to the departmental dose-optimization program which includes automated exposure control, adjustment of the mA and/or kV according to patient size and/or use of iterative reconstruction technique. COMPARISON:  08/07/2021 FINDINGS: Brain: Diffuse cerebral atrophy. Severe diffuse chronic small vessel disease throughout the deep white matter. Old left MCA infarct with encephalomalacia throughout the left cerebral hemisphere. No acute intracranial abnormality. Specifically, no hemorrhage,  hydrocephalus, mass lesion, acute infarction, or significant intracranial injury. Vascular: No hyperdense vessel or unexpected calcification. Skull: No acute calvarial abnormality. Sinuses/Orbits: No acute findings Other: None IMPRESSION: Atrophy, severe diffuse chronic small vessel disease. Old left MCA infarct. No acute intracranial abnormality. Electronically Signed   By: Rolm Baptise M.D.   On: 09/27/2021 20:07   CT Cervical Spine Wo Contrast  Result Date: 09/27/2021 CLINICAL DATA:  Neck trauma (Age >= 65y). EXAM: CT CERVICAL SPINE WITHOUT CONTRAST TECHNIQUE: Multidetector CT imaging of the cervical spine was performed without intravenous contrast. Multiplanar CT image reconstructions were also generated. RADIATION DOSE REDUCTION: This exam was performed according to the departmental dose-optimization program which includes automated exposure control, adjustment of the mA and/or kV according to patient size and/or use of  iterative reconstruction technique. COMPARISON:  11/04/2017 FINDINGS: Alignment: No subluxation. Skull base and vertebrae: No acute fracture. No primary bone lesion or focal pathologic process. Soft tissues and spinal canal: No prevertebral fluid or swelling. No visible canal hematoma. Disc levels:  Diffuse degenerative disc disease and facet disease. Upper chest: No acute abnormality. Other: None IMPRESSION: Degenerative disc and facet disease.  No acute bony abnormality. Electronically Signed   By: Rolm Baptise M.D.   On: 09/27/2021 20:10   DG Chest Portable 1 View  Result Date: 09/27/2021 CLINICAL DATA:  Shortness of breath.  Recently diagnosed with COVID. EXAM: PORTABLE CHEST 1 VIEW COMPARISON:  Chest x-ray dated 09/23/2021. FINDINGS: New patchy airspace opacities within the RIGHT mid lung region. No pleural effusion or pneumothorax is seen. Heart size and mediastinal contours are stable. Again noted is a large hiatal hernia. IMPRESSION: 1. New patchy airspace opacities within the RIGHT mid lung region, consistent with pneumonia, presumably COVID pneumonia. 2. Large hiatal hernia. Electronically Signed   By: Franki Cabot M.D.   On: 09/27/2021 20:22   DG Shoulder Left  Result Date: 09/28/2021 CLINICAL DATA:  Trauma, fall EXAM: LEFT SHOULDER - 2+ VIEW COMPARISON:  None. FINDINGS: No displaced fracture or dislocation is seen. Osteopenia is seen in bony structures. IMPRESSION: No fracture or dislocation is seen in the left shoulder. Electronically Signed   By: Elmer Picker M.D.   On: 09/28/2021 10:08   DG Humerus Right  Result Date: 09/27/2021 CLINICAL DATA:  Fall, pain. EXAM: RIGHT HUMERUS - 2+ VIEW COMPARISON:  None. FINDINGS: Osseous alignment is normal. No fracture line or displaced fracture fragment is seen. Adjacent soft tissues are unremarkable. IMPRESSION: Negative. Electronically Signed   By: Franki Cabot M.D.   On: 09/27/2021 20:27   DG Hand Complete Right  Result Date:  09/27/2021 CLINICAL DATA:  Fall, pain. EXAM: RIGHT HAND - COMPLETE 3+ VIEW COMPARISON:  None. FINDINGS: Displaced fracture of the ulnar styloid process, likely chronic. Distal radius and ulna appear otherwise intact and normally aligned. Carpal bones appear intact. Questionable nondisplaced fracture at the base of the fifth middle phalanx. Degenerative osteoarthritic changes noted at the scaphoid-trapezium joint space, moderate in degree with articular surface sclerosis. Interphalangeal joint spaces are relatively well maintained. IMPRESSION: 1. Questionable nondisplaced fracture at the base of the fifth middle phalanx. 2. Displaced fracture of the ulnar styloid process, likely chronic. 3. 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 Electronically Signed   By: Franki Cabot M.D.   On: 09/27/2021 20:26   DG Foot 2 Views Right  Result Date: 09/28/2021 CLINICAL DATA:  Trauma, fall EXAM: RIGHT FOOT - 2 VIEW COMPARISON:  None. FINDINGS: No displaced fracture or dislocation is  seen. Osteopenia is seen in bony structures. Arterial calcifications are seen in the soft tissues. IMPRESSION: No recent fracture or dislocation is seen in the limited two-view study of right foot. Electronically Signed   By: Elmer Picker M.D.   On: 09/28/2021 10:07   ECHOCARDIOGRAM COMPLETE  Result Date: 09/30/2021    ECHOCARDIOGRAM REPORT   Patient Name:   Crystal Faulkner Date of Exam: 09/29/2021 Medical Rec #:  [id number]        Height:       60.0 in Accession #:    [id number]       Weight:       114.6 lb Date of Birth:  1924/12/14         BSA:          1.473 m Patient Age:    88 years         BP:           148/86 mmHg Patient Gender: F                HR:           69 bpm. Exam Location:  [facility] Procedure: 2D Echo Indications:     Bacteremia R78.81  History:         Patient has prior history of Echocardiogram examinations, most                  recent 06/22/2016.  Sonographer:     Kathlen Brunswick  RDCS Referring Phys:  [place] IZTI Diagnosing Phys: Isaias Cowman MD IMPRESSIONS  1. Left ventricular ejection fraction, by estimation, is 60 to 65%. The left ventricle has normal function. The left ventricle has no regional wall motion abnormalities. Left ventricular diastolic parameters are consistent with Grade I diastolic dysfunction (impaired relaxation).  2. Right ventricular systolic function is normal. The right ventricular size is normal.  3. The mitral valve is normal in structure. Mild mitral valve regurgitation. No evidence of mitral stenosis.  4. The aortic valve is normal in structure. Aortic valve regurgitation is mild. No aortic stenosis is present.  5. The inferior vena cava is normal in size with greater than 50% respiratory variability, suggesting right atrial pressure of 3 mmHg. FINDINGS  Left Ventricle: Left ventricular ejection fraction, by estimation, is 60 to 65%. The left ventricle has normal function. The left ventricle has no regional wall motion abnormalities. The left ventricular internal cavity size was normal in size. There is  no left ventricular hypertrophy. Left ventricular diastolic parameters are consistent with Grade I diastolic dysfunction (impaired relaxation). Right Ventricle: The right ventricular size is normal. No increase in right ventricular wall thickness. Right ventricular systolic function is normal. Left Atrium: Left atrial size was normal in size. Right Atrium: Right atrial size was normal in size. Pericardium: There is no evidence of pericardial effusion. Mitral Valve: The mitral valve is normal in structure. Mild mitral valve regurgitation. No evidence of mitral valve stenosis. Tricuspid Valve: The tricuspid valve is normal in structure. Tricuspid valve regurgitation is mild . No evidence of tricuspid stenosis. Aortic Valve: The aortic valve is normal in structure. Aortic valve regurgitation is mild. Aortic regurgitation PHT measures 974 msec. No  aortic stenosis is present. Aortic valve peak gradient measures 5.7 mmHg. Pulmonic Valve: The pulmonic valve was normal in structure. Pulmonic valve regurgitation is not visualized. No evidence of pulmonic stenosis. Aorta: The aortic root is normal in size and structure. Venous: The inferior vena cava is normal  in size with greater than 50% respiratory variability, suggesting right atrial pressure of 3 mmHg. IAS/Shunts: No atrial level shunt detected by color flow Doppler.  LEFT VENTRICLE PLAX 2D LVIDd:         4.50 cm     Diastology LVIDs:         2.80 cm     LV e' medial:    3.59 cm/s LV PW:         1.00 cm     LV E/e' medial:  20.9 LV IVS:        0.80 cm     LV e' lateral:   5.33 cm/s LVOT diam:     1.80 cm     LV E/e' lateral: 14.1 LV SV:         42 LV SV Index:   29 LVOT Area:     2.54 cm  LV Volumes (MOD) LV vol d, MOD A2C: 46.4 ml LV vol d, MOD A4C: 51.1 ml LV vol s, MOD A2C: 20.4 ml LV vol s, MOD A4C: 17.7 ml LV SV MOD A2C:     26.0 ml LV SV MOD A4C:     51.1 ml LV SV MOD BP:      32.4 ml RIGHT VENTRICLE RV Basal diam:  2.60 cm RV S prime:     18.30 cm/s TAPSE (M-mode): 1.7 cm LEFT ATRIUM           Index        RIGHT ATRIUM           Index LA diam:      2.30 cm 1.56 cm/m   RA Area:     10.30 cm LA Vol (A2C): 4.8 ml  3.27 ml/m   RA Volume:   19.80 ml  13.44 ml/m LA Vol (A4C): 31.0 ml 21.04 ml/m  AORTIC VALVE                 PULMONIC VALVE AV Area (Vmax): 2.07 cm     PV Vmax:       1.10 m/s AV Vmax:        119.00 cm/s  PV Peak grad:  4.8 mmHg AV Peak Grad:   5.7 mmHg LVOT Vmax:      96.60 cm/s LVOT Vmean:     63.000 cm/s LVOT VTI:       0.166 m AI PHT:         974 msec  AORTA Ao Root diam: 2.80 cm Ao Asc diam:  3.50 cm MITRAL VALVE MV Area (PHT): 2.70 cm    SHUNTS MV Decel Time: 281 msec    Systemic VTI:  0.17 m MV E velocity: 75.00 cm/s  Systemic Diam: 1.80 cm MV A velocity: 89.10 cm/s MV E/A ratio:  0.84 Isaias Cowman MD Electronically signed by Isaias Cowman MD Signature Date/Time:  09/30/2021/1:30:20 PM    Final    DG HIP UNILAT WITH PELVIS 2-3 VIEWS RIGHT  Result Date: 09/28/2021 CLINICAL DATA:  Unwitnessed fall EXAM: DG HIP (WITH OR WITHOUT PELVIS) 2-3V RIGHT COMPARISON:  None. FINDINGS: Mild degenerative changes in the right hip. No evidence of acute fracture or dislocation. No focal bone lesions identified. Prominent vascular calcifications. IMPRESSION: Degenerative changes in the right hip. No acute displaced fractures identified. Electronically Signed   By: Lucienne Capers M.D.   On: 09/28/2021 19:05    Microbiology: Results for orders placed or performed during the hospital encounter of 09/27/21  Resp Panel by RT-PCR (Flu A&B,  Covid) Nasopharyngeal Swab     Status: Abnormal   Collection Time: 09/27/21  7:33 PM   Specimen: Nasopharyngeal Swab; Nasopharyngeal(NP) swabs in vial transport medium  Result Value Ref Range Status   SARS Coronavirus 2 by RT PCR POSITIVE (A) NEGATIVE Final    Comment: (NOTE) SARS-CoV-2 target nucleic acids are DETECTED.  The SARS-CoV-2 RNA is generally detectable in upper respiratory specimens during the acute phase of infection. Positive results are indicative of the presence of the identified virus, but do not rule out bacterial infection or co-infection with other pathogens not detected by the test. Clinical correlation with patient history and other diagnostic information is necessary to determine patient infection status. The expected result is Negative.  Fact Sheet for Patients: EntrepreneurPulse.com.au  Fact Sheet for Healthcare Providers: IncredibleEmployment.be  This test is not yet approved or cleared by the [place] FDA and  has been authorized for detection and/or diagnosis of SARS-CoV-2 by FDA under an Emergency Use Authorization (EUA).  This EUA will remain in effect (meaning this test can be used) for the duration of  the COVID-19 declaration under Section 564(b)(1) of the A  ct, 21 U.S.C. section 360bbb-3(b)(1), unless the authorization is terminated or revoked sooner.     Influenza A by PCR NEGATIVE NEGATIVE Final   Influenza B by PCR NEGATIVE NEGATIVE Final    Comment: (NOTE) The Xpert Xpress SARS-CoV-2/FLU/RSV plus assay is intended as an aid in the diagnosis of influenza from Nasopharyngeal swab specimens and should not be used as a sole basis for treatment. Nasal washings and aspirates are unacceptable for Xpert Xpress SARS-CoV-2/FLU/RSV testing.  Fact Sheet for Patients: EntrepreneurPulse.com.au  Fact Sheet for Healthcare Providers: IncredibleEmployment.be  This test is not yet approved or cleared by the [place] FDA and has been authorized for detection and/or diagnosis of SARS-CoV-2 by FDA under an Emergency Use Authorization (EUA). This EUA will remain in effect (meaning this test can be used) for the duration of the COVID-19 declaration under Section 564(b)(1) of the Act, 21 U.S.C. section 360bbb-3(b)(1), unless the authorization is terminated or revoked.  Performed at [facility], [address] [place], [place] [address]   Blood culture (routine x 2)     Status: Abnormal   Collection Time: 09/27/21  7:33 PM   Specimen: Left Antecubital; Blood  Result Value Ref Range Status   Specimen Description   Final    LEFT ANTECUBITAL Performed at [facility], [address] [place], [place] [address]    Special Requests   Final    BOTTLES DRAWN AEROBIC AND ANAEROBIC Blood Culture results may not be optimal due to an inadequate volume of blood received in culture bottles Performed at [facility], [address] [place], [place] [address]    Culture  Setup Time   Final    GRAM POSITIVE COCCI IN BOTH AEROBIC AND ANAEROBIC BOTTLES CRITICAL RESULT CALLED TO, READ BACK BY AND VERIFIED WITH: [place] @1432  09/28/21 SCS    Culture STAPHYLOCOCCUS CAPITIS (A)  Final    Report Status 10/01/2021 FINAL  Final   Organism ID, Bacteria STAPHYLOCOCCUS CAPITIS  Final      Susceptibility   Staphylococcus capitis - MIC*    CIPROFLOXACIN <=0.5 SENSITIVE Sensitive     ERYTHROMYCIN 0.5 SENSITIVE Sensitive     GENTAMICIN <=0.5 SENSITIVE Sensitive     OXACILLIN RESISTANT Resistant     TETRACYCLINE <=1 SENSITIVE Sensitive     VANCOMYCIN <=0.5 SENSITIVE Sensitive  TRIMETH/SULFA <=10 SENSITIVE Sensitive     CLINDAMYCIN <=0.25 SENSITIVE Sensitive     RIFAMPIN <=0.5 SENSITIVE Sensitive     Inducible Clindamycin NEGATIVE Sensitive     * STAPHYLOCOCCUS CAPITIS  Blood culture (routine x 2)     Status: Abnormal   Collection Time: 09/27/21  7:33 PM   Specimen: Right Antecubital; Blood  Result Value Ref Range Status   Specimen Description   Final    RIGHT ANTECUBITAL Performed at [facility], [address], [place], [place] [address]    Special Requests   Final    BOTTLES DRAWN AEROBIC AND ANAEROBIC Blood Culture results may not be optimal due to an inadequate volume of blood received in culture bottles Performed at [facility], [address] [place], [place] [address]    Culture  Setup Time   Final    IN BOTH AEROBIC AND ANAEROBIC BOTTLES GRAM POSITIVE COCCI CRITICAL VALUE NOTED.  VALUE IS CONSISTENT WITH PREVIOUSLY REPORTED AND CALLED VALUE. SCS 09/28/21     Culture (A)  Final    STAPHYLOCOCCUS CAPITIS SUSCEPTIBILITIES PERFORMED ON PREVIOUS CULTURE WITHIN THE LAST 5 DAYS. Performed at [facility], [place] [address], [place], [place] [address]    Report Status 10/02/2021 FINAL  Final  Blood Culture ID Panel (Reflexed)     Status: Abnormal   Collection Time: 09/27/21  7:33 PM  Result Value Ref Range Status   Enterococcus faecalis NOT DETECTED NOT DETECTED Final   Enterococcus Faecium NOT DETECTED NOT DETECTED Final   Listeria monocytogenes NOT DETECTED NOT DETECTED Final   Staphylococcus species DETECTED (A) NOT DETECTED Final     Comment: CRITICAL RESULT CALLED TO, READ BACK BY AND VERIFIED WITH: SUSAN WATSON @1432  09/28/21 SCS    Staphylococcus aureus (BCID) NOT DETECTED NOT DETECTED Final   Staphylococcus epidermidis NOT DETECTED NOT DETECTED Final   Staphylococcus lugdunensis NOT DETECTED NOT DETECTED Final   Streptococcus species NOT DETECTED NOT DETECTED Final   Streptococcus agalactiae NOT DETECTED NOT DETECTED Final   Streptococcus pneumoniae NOT DETECTED NOT DETECTED Final   Streptococcus pyogenes NOT DETECTED NOT DETECTED Final   A.calcoaceticus-baumannii NOT DETECTED NOT DETECTED Final   Bacteroides fragilis NOT DETECTED NOT DETECTED Final   Enterobacterales NOT DETECTED NOT DETECTED Final   Enterobacter cloacae complex NOT DETECTED NOT DETECTED Final   Escherichia coli NOT DETECTED NOT DETECTED Final   Klebsiella aerogenes NOT DETECTED NOT DETECTED Final   Klebsiella oxytoca NOT DETECTED NOT DETECTED Final   Klebsiella pneumoniae NOT DETECTED NOT DETECTED Final   Proteus species NOT DETECTED NOT DETECTED Final   Salmonella species NOT DETECTED NOT DETECTED Final   Serratia marcescens NOT DETECTED NOT DETECTED Final   Haemophilus influenzae NOT DETECTED NOT DETECTED Final   Neisseria meningitidis NOT DETECTED NOT DETECTED Final   Pseudomonas aeruginosa NOT DETECTED NOT DETECTED Final   Stenotrophomonas maltophilia NOT DETECTED NOT DETECTED Final   Candida albicans NOT DETECTED NOT DETECTED Final   Candida auris NOT DETECTED NOT DETECTED Final   Candida glabrata NOT DETECTED NOT DETECTED Final   Candida krusei NOT DETECTED NOT DETECTED Final   Candida parapsilosis NOT DETECTED NOT DETECTED Final   Candida tropicalis NOT DETECTED NOT DETECTED Final   Cryptococcus neoformans/gattii NOT DETECTED NOT DETECTED Final    Comment: Performed at [facility], [address] [place], [place] [address]  MRSA Next Gen by PCR, Nasal     Status: None   Collection Time: 09/28/21  2:00 PM  Result Value Ref  Range Status   MRSA by PCR Next Gen NOT DETECTED NOT DETECTED Final    Comment: (NOTE) The GeneXpert MRSA Assay (FDA approved for NASAL specimens only), is one component of a comprehensive MRSA colonization surveillance program. It is not intended to diagnose MRSA infection nor to guide or monitor treatment for MRSA infections. Test performance is not FDA approved in patients less than 21 years old. Performed at [facility], [address] [place], [place] [address]   CULTURE, BLOOD (ROUTINE X 2) w Reflex to ID Panel     Status: None   Collection Time: 09/29/21  1:29 PM   Specimen: BLOOD  Result Value Ref Range Status   Specimen Description BLOOD [facility]  Final   Special Requests BOTTLES DRAWN AEROBIC AND ANAEROBIC BCLV  Final   Culture   Final    NO GROWTH 5 DAYS Performed at [facility], [address] [place], [place] [address]    Report Status 10/04/2021 FINAL  Final  CULTURE, BLOOD (ROUTINE X 2) w Reflex to ID Panel     Status: None   Collection Time: 09/29/21  3:25 PM   Specimen: BLOOD  Result Value Ref Range Status   Specimen Description BLOOD [facility]  Final   Special Requests BOTTLES DRAWN AEROBIC AND ANAEROBIC BCAV  Final   Culture   Final    NO GROWTH 5 DAYS Performed at [facility], [address] [place], [place] [address]    Report Status 10/04/2021 FINAL  Final     Discharge time spent: greater than 30 minutes.  Signed: Loletha Grayer, MD [facility] 10/12/2021

## 2021-10-12 NOTE — TOC Transition Note (Addendum)
Transition of Care [facility]) - CM/SW Discharge Note   Patient Details  Name: Crystal Faulkner MRN: [id number] Date of Birth: 02-25-1925  Transition of Care [facility]) CM/SW Contact:  Magnus Ivan, LCSW Phone Number: 10/12/2021, 1:20 PM   Clinical Narrative:   Patient to DC to hospice home today.  [facility] Representative Anderson Malta has updated the family. EMS paperwork completed. Confirmed patient has a signed DNR. Will call EMS when RN and Hospice are ready for transport.   2:53- [facility] arranged to hospice home.  Patient is 2nd on the list, RN notified.   Final next level of care: [place] Barriers to Discharge: Barriers Resolved   Patient Goals and CMS Choice Patient states their goals for this hospitalization and ongoing recovery are:: hospice home CMS Medicare.gov Compare Post Acute Care list provided to:: Patient Represenative (must comment) (daughter [name]) Choice offered to / list presented to : Adult Children  Discharge Placement                Patient to be transferred to facility by: [facility]      Discharge Plan and Services                                     Social Determinants of Health (SDOH) Interventions     Readmission Risk Interventions No flowsheet data found.

## 2021-10-12 NOTE — Progress Notes (Signed)
Patient discharged to hospice via EMS at [facility], daughter in room during transport.

## 2021-10-12 NOTE — Progress Notes (Signed)
Manufacturing engineer [facility])  Crystal Faulkner has been approved for residential hospice, and we have a bed today for her at [facility].  Necessary consents have to be completed prior to transport being arranged. [facility] will update TOC manager once this step has been completed.  RN staff, our nurse will call you directly to get report.   Venia Carbon BSN, RN [facility] Liaison

## 2021-10-12 NOTE — TOC Progression Note (Addendum)
Transition of Care [facility]) - Progression Note    Patient Details  Name: NICKAYLA MCINNIS MRN: [id number] Date of Birth: Dec 15, 1924  Transition of Care [facility]) CM/SW [place], LCSW Phone Number: 10/12/2021, 8:51 AM  Clinical Narrative:   Reached out to Tammy at [facility] to see if they have made a financial agreement with family so that patient can DC to [facility] with hospice. Per Tammy, they have not heard back from family since they discussed cost of placement $21,000 for 2 months.    Expected Discharge Plan: Skilled Nursing Facility Barriers to Discharge: Continued Medical Work up  Expected Discharge Plan and Services Expected Discharge Plan: [place]                                               Social Determinants of Health (SDOH) Interventions    Readmission Risk Interventions No flowsheet data found.

## 2021-10-12 NOTE — Progress Notes (Signed)
Report called to Miguel Rota RN at [facility].

## 2021-10-23 DEATH — deceased

## 2021-12-09 ENCOUNTER — Ambulatory Visit: Payer: Medicare HMO | Admitting: Family Medicine
